# Patient Record
Sex: Female | Born: 1950
Health system: Southern US, Community
[De-identification: ages and names within clinical notes are randomized; demographics above are authoritative.]

## PROBLEM LIST (undated history)

## (undated) DIAGNOSIS — G479 Sleep disorder, unspecified: Secondary | ICD-10-CM

## (undated) DIAGNOSIS — L509 Urticaria, unspecified: Secondary | ICD-10-CM

## (undated) DIAGNOSIS — K589 Irritable bowel syndrome without diarrhea: Secondary | ICD-10-CM

## (undated) DIAGNOSIS — K219 Gastro-esophageal reflux disease without esophagitis: Secondary | ICD-10-CM

## (undated) DIAGNOSIS — M810 Age-related osteoporosis without current pathological fracture: Secondary | ICD-10-CM

## (undated) DIAGNOSIS — G43909 Migraine, unspecified, not intractable, without status migrainosus: Secondary | ICD-10-CM

## (undated) DIAGNOSIS — R0902 Hypoxemia: Secondary | ICD-10-CM

## (undated) DIAGNOSIS — E039 Hypothyroidism, unspecified: Secondary | ICD-10-CM

## (undated) DIAGNOSIS — M797 Fibromyalgia: Secondary | ICD-10-CM

## (undated) DIAGNOSIS — H269 Unspecified cataract: Secondary | ICD-10-CM

## (undated) DIAGNOSIS — E785 Hyperlipidemia, unspecified: Secondary | ICD-10-CM

## (undated) DIAGNOSIS — K439 Ventral hernia without obstruction or gangrene: Secondary | ICD-10-CM

## (undated) DIAGNOSIS — E119 Type 2 diabetes mellitus without complications: Secondary | ICD-10-CM

## (undated) DIAGNOSIS — E079 Disorder of thyroid, unspecified: Secondary | ICD-10-CM

## (undated) DIAGNOSIS — K769 Liver disease, unspecified: Secondary | ICD-10-CM

## (undated) DIAGNOSIS — E669 Obesity, unspecified: Secondary | ICD-10-CM

## (undated) DIAGNOSIS — I10 Essential (primary) hypertension: Secondary | ICD-10-CM

## (undated) DIAGNOSIS — A159 Respiratory tuberculosis unspecified: Secondary | ICD-10-CM

## (undated) DIAGNOSIS — M1990 Unspecified osteoarthritis, unspecified site: Secondary | ICD-10-CM

## (undated) DIAGNOSIS — T7840XA Allergy, unspecified, initial encounter: Secondary | ICD-10-CM

## (undated) DIAGNOSIS — J189 Pneumonia, unspecified organism: Secondary | ICD-10-CM

## (undated) DIAGNOSIS — R42 Dizziness and giddiness: Secondary | ICD-10-CM

## (undated) DIAGNOSIS — G4730 Sleep apnea, unspecified: Secondary | ICD-10-CM

## (undated) DIAGNOSIS — F419 Anxiety disorder, unspecified: Secondary | ICD-10-CM

## (undated) DIAGNOSIS — F32A Depression, unspecified: Secondary | ICD-10-CM

## (undated) DIAGNOSIS — N182 Chronic kidney disease, stage 2 (mild): Secondary | ICD-10-CM

## (undated) DIAGNOSIS — F329 Major depressive disorder, single episode, unspecified: Secondary | ICD-10-CM

## (undated) HISTORY — DX: Ventral hernia without obstruction or gangrene: K43.9

## (undated) HISTORY — DX: Hyperlipidemia, unspecified: E78.5

## (undated) HISTORY — DX: Essential (primary) hypertension: I10

## (undated) HISTORY — DX: Migraine, unspecified, not intractable, without status migrainosus: G43.909

## (undated) HISTORY — DX: Disorder of thyroid, unspecified: E07.9

## (undated) HISTORY — DX: Unspecified osteoarthritis, unspecified site: M19.90

## (undated) HISTORY — PX: TONSILLECTOMY: SUR1361

## (undated) HISTORY — DX: Gastro-esophageal reflux disease without esophagitis: K21.9

## (undated) HISTORY — DX: Fibromyalgia: M79.7

## (undated) HISTORY — DX: Hypoxemia: R09.02

## (undated) HISTORY — PX: TOTAL KNEE ARTHROPLASTY: SHX125

## (undated) HISTORY — DX: Anxiety disorder, unspecified: F41.9

## (undated) HISTORY — DX: Sleep disorder, unspecified: G47.9

## (undated) HISTORY — PX: ABDOMINAL HYSTERECTOMY: SHX81

## (undated) HISTORY — PX: JOINT REPLACEMENT: SHX530

## (undated) HISTORY — PX: CHOLECYSTECTOMY: SHX55

## (undated) HISTORY — DX: Sleep apnea, unspecified: G47.30

## (undated) HISTORY — DX: Obesity, unspecified: E66.9

## (undated) HISTORY — DX: Age-related osteoporosis without current pathological fracture: M81.0

## (undated) HISTORY — DX: Chronic kidney disease, stage 2 (mild): N18.2

## (undated) HISTORY — PX: CARPAL TUNNEL RELEASE: SHX101

## (undated) HISTORY — PX: ADENOIDECTOMY: SUR15

## (undated) HISTORY — DX: Liver disease, unspecified: K76.9

## (undated) HISTORY — PX: APPENDECTOMY: SHX54

## (undated) HISTORY — PX: TUBAL LIGATION: SHX77

## (undated) HISTORY — DX: Type 2 diabetes mellitus without complications: E11.9

## (undated) HISTORY — DX: Respiratory tuberculosis unspecified: A15.9

## (undated) HISTORY — DX: Major depressive disorder, single episode, unspecified: F32.9

## (undated) HISTORY — DX: Unspecified cataract: H26.9

## (undated) HISTORY — DX: Dizziness and giddiness: R42

## (undated) HISTORY — DX: Urticaria, unspecified: L50.9

## (undated) HISTORY — DX: Depression, unspecified: F32.A

## (undated) HISTORY — DX: Allergy, unspecified, initial encounter: T78.40XA

## (undated) HISTORY — DX: Irritable bowel syndrome, unspecified: K58.9

## (undated) HISTORY — PX: SINOSCOPY: SHX187

## (undated) HISTORY — PX: KNEE ARTHROSCOPY: SUR90

## (undated) HISTORY — PX: COLONOSCOPY: SHX174

## (undated) HISTORY — PX: CATARACT EXTRACTION, BILATERAL: SHX1313

---

## 2000-03-06 ENCOUNTER — Encounter: Payer: Self-pay | Admitting: *Deleted

## 2000-03-06 ENCOUNTER — Encounter: Admission: RE | Admit: 2000-03-06 | Discharge: 2000-03-06 | Payer: Self-pay | Admitting: *Deleted

## 2000-08-30 ENCOUNTER — Other Ambulatory Visit: Admission: RE | Admit: 2000-08-30 | Discharge: 2000-08-30 | Payer: Self-pay | Admitting: *Deleted

## 2000-09-06 ENCOUNTER — Encounter: Admission: RE | Admit: 2000-09-06 | Discharge: 2000-09-06 | Payer: Self-pay | Admitting: *Deleted

## 2000-12-19 ENCOUNTER — Encounter: Admission: RE | Admit: 2000-12-19 | Discharge: 2000-12-19 | Payer: Self-pay | Admitting: Otolaryngology

## 2000-12-19 ENCOUNTER — Encounter: Payer: Self-pay | Admitting: Otolaryngology

## 2001-04-03 ENCOUNTER — Encounter: Payer: Self-pay | Admitting: *Deleted

## 2001-04-03 ENCOUNTER — Encounter: Admission: RE | Admit: 2001-04-03 | Discharge: 2001-04-03 | Payer: Self-pay | Admitting: *Deleted

## 2001-09-19 ENCOUNTER — Other Ambulatory Visit: Admission: RE | Admit: 2001-09-19 | Discharge: 2001-09-19 | Payer: Self-pay | Admitting: *Deleted

## 2002-04-16 ENCOUNTER — Encounter: Admission: RE | Admit: 2002-04-16 | Discharge: 2002-04-16 | Payer: Self-pay | Admitting: Internal Medicine

## 2002-04-16 ENCOUNTER — Encounter: Payer: Self-pay | Admitting: Internal Medicine

## 2003-05-05 ENCOUNTER — Encounter: Payer: Self-pay | Admitting: Internal Medicine

## 2003-05-05 ENCOUNTER — Encounter: Admission: RE | Admit: 2003-05-05 | Discharge: 2003-05-05 | Payer: Self-pay | Admitting: Internal Medicine

## 2003-10-19 ENCOUNTER — Other Ambulatory Visit: Admission: RE | Admit: 2003-10-19 | Discharge: 2003-10-19 | Payer: Self-pay | Admitting: Internal Medicine

## 2003-10-21 ENCOUNTER — Ambulatory Visit (HOSPITAL_COMMUNITY): Admission: RE | Admit: 2003-10-21 | Discharge: 2003-10-21 | Payer: Self-pay | Admitting: Internal Medicine

## 2004-10-20 ENCOUNTER — Ambulatory Visit (HOSPITAL_COMMUNITY): Admission: RE | Admit: 2004-10-20 | Discharge: 2004-10-20 | Payer: Self-pay | Admitting: Internal Medicine

## 2005-10-02 ENCOUNTER — Encounter: Admission: RE | Admit: 2005-10-02 | Discharge: 2005-10-29 | Payer: Self-pay | Admitting: Internal Medicine

## 2008-03-17 ENCOUNTER — Encounter: Admission: RE | Admit: 2008-03-17 | Discharge: 2008-03-17 | Payer: Self-pay | Admitting: General Surgery

## 2008-05-27 ENCOUNTER — Other Ambulatory Visit: Admission: RE | Admit: 2008-05-27 | Discharge: 2008-05-27 | Payer: Self-pay | Admitting: Internal Medicine

## 2008-06-29 ENCOUNTER — Emergency Department (HOSPITAL_COMMUNITY): Admission: EM | Admit: 2008-06-29 | Discharge: 2008-06-29 | Payer: Self-pay | Admitting: Emergency Medicine

## 2008-10-21 ENCOUNTER — Ambulatory Visit (HOSPITAL_COMMUNITY): Admission: RE | Admit: 2008-10-21 | Discharge: 2008-10-21 | Payer: Self-pay | Admitting: Internal Medicine

## 2009-11-19 ENCOUNTER — Ambulatory Visit (HOSPITAL_COMMUNITY): Admission: RE | Admit: 2009-11-19 | Discharge: 2009-11-19 | Payer: Self-pay | Admitting: Internal Medicine

## 2010-02-16 ENCOUNTER — Encounter: Admission: RE | Admit: 2010-02-16 | Discharge: 2010-02-16 | Payer: Self-pay | Admitting: Orthopedic Surgery

## 2010-06-08 ENCOUNTER — Encounter: Payer: Self-pay | Admitting: Cardiovascular Disease

## 2010-08-24 ENCOUNTER — Ambulatory Visit (HOSPITAL_COMMUNITY): Admission: RE | Admit: 2010-08-24 | Discharge: 2010-08-24 | Payer: Self-pay | Admitting: Internal Medicine

## 2010-12-06 ENCOUNTER — Ambulatory Visit: Payer: Self-pay | Admitting: Cardiovascular Disease

## 2010-12-11 ENCOUNTER — Emergency Department (HOSPITAL_COMMUNITY)
Admission: EM | Admit: 2010-12-11 | Discharge: 2010-12-11 | Disposition: A | Discharge status: Home / Self Care | Payer: BC Managed Care – PPO | Attending: Emergency Medicine | Admitting: Emergency Medicine

## 2010-12-11 DIAGNOSIS — E7800 Pure hypercholesterolemia, unspecified: Secondary | ICD-10-CM | POA: Insufficient documentation

## 2010-12-11 DIAGNOSIS — Z79899 Other long term (current) drug therapy: Secondary | ICD-10-CM | POA: Insufficient documentation

## 2010-12-11 DIAGNOSIS — I10 Essential (primary) hypertension: Secondary | ICD-10-CM | POA: Insufficient documentation

## 2010-12-11 DIAGNOSIS — J45909 Unspecified asthma, uncomplicated: Secondary | ICD-10-CM | POA: Insufficient documentation

## 2010-12-11 DIAGNOSIS — K219 Gastro-esophageal reflux disease without esophagitis: Secondary | ICD-10-CM | POA: Insufficient documentation

## 2010-12-11 DIAGNOSIS — F3289 Other specified depressive episodes: Secondary | ICD-10-CM | POA: Insufficient documentation

## 2010-12-11 DIAGNOSIS — E039 Hypothyroidism, unspecified: Secondary | ICD-10-CM | POA: Insufficient documentation

## 2010-12-11 DIAGNOSIS — F329 Major depressive disorder, single episode, unspecified: Secondary | ICD-10-CM | POA: Insufficient documentation

## 2010-12-11 DIAGNOSIS — R7309 Other abnormal glucose: Secondary | ICD-10-CM | POA: Insufficient documentation

## 2010-12-11 LAB — POCT I-STAT, CHEM 8
BUN: 11 mg/dL (ref 6–23)
Calcium, Ion: 1.01 mmol/L — ABNORMAL LOW (ref 1.12–1.32)
HCT: 45 % (ref 36.0–46.0)
Hemoglobin: 15.3 g/dL — ABNORMAL HIGH (ref 12.0–15.0)
Sodium: 133 mEq/L — ABNORMAL LOW (ref 135–145)
TCO2: 27 mmol/L (ref 0–100)

## 2010-12-11 LAB — URINALYSIS, ROUTINE W REFLEX MICROSCOPIC
Ketones, ur: NEGATIVE mg/dL
pH: 6.5 (ref 5.0–8.0)

## 2010-12-11 LAB — GLUCOSE, CAPILLARY: Glucose-Capillary: 234 mg/dL — ABNORMAL HIGH (ref 70–99)

## 2010-12-11 LAB — URINE MICROSCOPIC-ADD ON

## 2010-12-15 ENCOUNTER — Encounter: Payer: Self-pay | Admitting: Cardiovascular Disease

## 2010-12-15 ENCOUNTER — Ambulatory Visit (INDEPENDENT_AMBULATORY_CARE_PROVIDER_SITE_OTHER): Payer: BC Managed Care – PPO | Admitting: Cardiovascular Disease

## 2010-12-15 DIAGNOSIS — I10 Essential (primary) hypertension: Secondary | ICD-10-CM

## 2010-12-15 DIAGNOSIS — E1129 Type 2 diabetes mellitus with other diabetic kidney complication: Secondary | ICD-10-CM | POA: Insufficient documentation

## 2010-12-15 DIAGNOSIS — Z01810 Encounter for preprocedural cardiovascular examination: Secondary | ICD-10-CM

## 2010-12-15 DIAGNOSIS — G4733 Obstructive sleep apnea (adult) (pediatric): Secondary | ICD-10-CM | POA: Insufficient documentation

## 2010-12-15 DIAGNOSIS — K219 Gastro-esophageal reflux disease without esophagitis: Secondary | ICD-10-CM | POA: Insufficient documentation

## 2010-12-15 DIAGNOSIS — E039 Hypothyroidism, unspecified: Secondary | ICD-10-CM | POA: Insufficient documentation

## 2010-12-15 DIAGNOSIS — F3340 Major depressive disorder, recurrent, in remission, unspecified: Secondary | ICD-10-CM | POA: Insufficient documentation

## 2010-12-15 DIAGNOSIS — F32A Depression, unspecified: Secondary | ICD-10-CM | POA: Insufficient documentation

## 2010-12-15 DIAGNOSIS — E119 Type 2 diabetes mellitus without complications: Secondary | ICD-10-CM | POA: Insufficient documentation

## 2010-12-21 NOTE — Assessment & Plan Note (Signed)
Summary: consult.for surgical clearance - total knee replacement by dr...   Visit Type:  Initial Consult Primary Provider:  Dr. Marisue Brooklyn  CC:  No complaints.  History of Present Illness: 59 yo WF with history of DM, HTN, hyperlipidemia, obesity who is referred today for pre-operative evaluation prior to upcoming left knee replacement in April with Dr. Thurston Hole.  She denies any chest pain, SOB, palpitations, near syncope or syncope. She feels well except for her knee pain. She underwent a stress test several years ago with Dr. Eden Emms in w/u of palpitations and this was normal. She is not limited in any of her activities of daily living because of chest pain, fatigue or SOB.   Current Medications (verified): 1)  Metformin Hcl 500 Mg Tabs (Metformin Hcl) .... Take 1 Tablet By Mouth Two Times A Day 2)  Diovan Hct 320-25 Mg Tabs (Valsartan-Hydrochlorothiazide) .... Take 1 Tablet By Mouth Once A Day 3)  Cytomel 25 Mcg Tabs (Liothyronine Sodium) .Marland Kitchen.. 1 Tab By Mouth Two Times A Day 4)  Crestor 20 Mg Tabs (Rosuvastatin Calcium) .... Take 1 Tablet By Mouth Once A Day 5)  Cymbalta 60 Mg Cpep (Duloxetine Hcl) .... Take 1 Tablet By Mouth Once A Day 6)  Nexium 40 Mg Cpdr (Esomeprazole Magnesium) .... Take 1 Tablet By Mouth Once A Day 7)  Doxepin Hcl 10 Mg Caps (Doxepin Hcl) .... As Needed For Sleep 8)  Xyzal 5 Mg Tabs (Levocetirizine Dihydrochloride) .... As Needed For Allergies 9)  Novolog Flexpen 100 Unit/ml Soln (Insulin Aspart) .... As Directed 10)  Synthroid 175 Mcg Tabs (Levothyroxine Sodium) .... 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  Alcohol Use - yes, occasional Drug Use - no  Review of Systems       The patient complains of joint pain.  The patient denies fatigue, malaise, fever, weight gain/loss, vision loss, decreased hearing, hoarseness, chest pain, palpitations, shortness of breath, prolonged cough, wheezing, sleep apnea, coughing up blood, abdominal pain, blood in stool, nausea, vomiting, diarrhea, heartburn, incontinence, blood in urine, muscle weakness, leg swelling, rash, skin lesions, headache, fainting, dizziness, depression, anxiety, enlarged lymph nodes, easy bruising or bleeding, and environmental allergies.    Vital Signs:  Patient profile:   60 year old female Height:      63 inches Weight:      235.50 pounds BMI:     41.87 Pulse rate:   98 / minute Resp:     18 per minute BP sitting:   132 / 92  (left arm) Cuff size:   large  Vitals Entered By: Celestia Khat, CMA (December 15, 2010 4:00 PM)  Physical Exam  General:  General: Well developed, well nourished, NAD HEENT: OP clear, mucus membranes moist SKIN: warm, damp Neuro: No focal deficits Musculoskeletal: Muscle strength 5/5 all ext Psychiatric: Mood and affect normal Neck: No JVD, no carotid bruits, no thyromegaly, no lymphadenopathy. Lungs:Clear bilaterally, no wheezes, rhonci, crackles CV: RRR no murmurs, gallops rubs Abdomen: soft, NT, ND, BS present Extremities: No  edema, pulses 2+.    EKG  Procedure date:  12/15/2010  Findings:      sinus rhythm, rate 99 bpm. Non-specific T wave abnormality lateral leads.   Impression & Recommendations:  Problem # 1:  PRE-OPERATIVE CARDIAC EXAM (ICD-V72.81) Grace Barry has well controlled HTN, hyperlipidemia and recently diagnosed DM. She has  no chest pain worrisome for angina, CHF or evidence of arrhthymias. She is functioning in her activities of daily liviing without problems. Based on current ACC/AHA guidelines, no ischemic testing is indicated prior to her planned knee surgery. I would proceed to the surgery without further cardiac workup.   Patient Instructions: 1)  Your physician recommends that you schedule a follow-up appointment in: as needed.

## 2011-01-05 NOTE — Letter (Signed)
Summary: [facility]  [facility]   Imported By: Marylou Mccoy 12/29/2010 15:10:18  _____________________________________________________________________  External Attachment:    Type:   Image     Comment:   External Document

## 2011-01-31 ENCOUNTER — Other Ambulatory Visit (HOSPITAL_COMMUNITY): Payer: Self-pay | Admitting: Orthopedic Surgery

## 2011-01-31 ENCOUNTER — Telehealth: Payer: Self-pay | Admitting: Cardiovascular Disease

## 2011-01-31 ENCOUNTER — Encounter (HOSPITAL_COMMUNITY)
Admission: RE | Admit: 2011-01-31 | Discharge: 2011-01-31 | Disposition: A | Discharge status: Home / Self Care | Payer: BC Managed Care – PPO | Source: Ambulatory Visit | Attending: Orthopedic Surgery | Admitting: Orthopedic Surgery

## 2011-01-31 ENCOUNTER — Ambulatory Visit (HOSPITAL_COMMUNITY)
Admission: RE | Admit: 2011-01-31 | Discharge: 2011-01-31 | Disposition: A | Discharge status: Home / Self Care | Payer: BC Managed Care – PPO | Source: Ambulatory Visit | Attending: Orthopedic Surgery | Admitting: Orthopedic Surgery

## 2011-01-31 DIAGNOSIS — Z01812 Encounter for preprocedural laboratory examination: Secondary | ICD-10-CM | POA: Insufficient documentation

## 2011-01-31 DIAGNOSIS — M1712 Unilateral primary osteoarthritis, left knee: Secondary | ICD-10-CM

## 2011-01-31 DIAGNOSIS — I10 Essential (primary) hypertension: Secondary | ICD-10-CM | POA: Insufficient documentation

## 2011-01-31 DIAGNOSIS — M1710 Unilateral primary osteoarthritis, unspecified knee: Secondary | ICD-10-CM | POA: Insufficient documentation

## 2011-01-31 DIAGNOSIS — IMO0002 Reserved for concepts with insufficient information to code with codable children: Secondary | ICD-10-CM | POA: Insufficient documentation

## 2011-01-31 DIAGNOSIS — G4730 Sleep apnea, unspecified: Secondary | ICD-10-CM | POA: Insufficient documentation

## 2011-01-31 DIAGNOSIS — Z01818 Encounter for other preprocedural examination: Secondary | ICD-10-CM | POA: Insufficient documentation

## 2011-01-31 DIAGNOSIS — Z01810 Encounter for preprocedural cardiovascular examination: Secondary | ICD-10-CM | POA: Insufficient documentation

## 2011-01-31 LAB — PROTIME-INR: Prothrombin Time: 13.4 seconds (ref 11.6–15.2)

## 2011-01-31 LAB — COMPREHENSIVE METABOLIC PANEL
ALT: 42 U/L — ABNORMAL HIGH (ref 0–35)
AST: 34 U/L (ref 0–37)
Albumin: 4 g/dL (ref 3.5–5.2)
Calcium: 9.7 mg/dL (ref 8.4–10.5)
Creatinine, Ser: 0.79 mg/dL (ref 0.4–1.2)
GFR calc non Af Amer: >60 mL/min (ref >60)
Sodium: 137 mEq/L (ref 135–145)
Total Protein: 7.1 g/dL (ref 6.0–8.3)

## 2011-01-31 LAB — APTT: aPTT: 45 seconds — ABNORMAL HIGH (ref 24–37)

## 2011-01-31 LAB — CBC
HCT: 40.1 % (ref 36.0–46.0)
MCH: 28.5 pg (ref 26.0–34.0)
MCV: 83.9 fL (ref 78.0–100.0)
Platelets: 261 10*3/uL (ref 150–400)
RDW: 12.7 % (ref 11.5–15.5)
WBC: 9.9 10*3/uL (ref 4.0–10.5)

## 2011-01-31 LAB — SURGICAL PCR SCREEN
MRSA, PCR: NEGATIVE
Staphylococcus aureus: NEGATIVE

## 2011-01-31 LAB — LIPID PANEL
HDL: 32 mg/dL — ABNORMAL LOW (ref >39)
Total CHOL/HDL Ratio: 4 RATIO
VLDL: 32 mg/dL (ref 0–40)

## 2011-01-31 LAB — URINALYSIS, ROUTINE W REFLEX MICROSCOPIC
Bilirubin Urine: NEGATIVE
Ketones, ur: NEGATIVE mg/dL
Nitrite: NEGATIVE
Urobilinogen, UA: 0.2 mg/dL (ref 0.0–1.0)

## 2011-01-31 LAB — URINE MICROSCOPIC-ADD ON

## 2011-01-31 LAB — DIFFERENTIAL
Basophils Absolute: 0 10*3/uL (ref 0.0–0.1)
Basophils Relative: 0 % (ref 0–1)
Eosinophils Absolute: 0.2 10*3/uL (ref 0.0–0.7)
Eosinophils Relative: 2 % (ref 0–5)

## 2011-01-31 LAB — HEMOGLOBIN A1C: Hgb A1c MFr Bld: 7.7 % — ABNORMAL HIGH (ref <5.7)

## 2011-01-31 NOTE — Telephone Encounter (Signed)
12 lead faxed to Kaye/[facility] @ [phone] 01/31/11/IKM

## 2011-02-01 LAB — URINE CULTURE
Colony Count: NO GROWTH
Culture  Setup Time: 201204031608
Culture: NO GROWTH

## 2011-02-06 ENCOUNTER — Inpatient Hospital Stay (HOSPITAL_COMMUNITY)
Admission: RE | Admit: 2011-02-06 | Discharge: 2011-02-08 | DRG: 209 | Disposition: A | Discharge status: Home / Self Care | Payer: BC Managed Care – PPO | Source: Ambulatory Visit | Attending: Orthopedic Surgery | Admitting: Orthopedic Surgery

## 2011-02-06 DIAGNOSIS — M1710 Unilateral primary osteoarthritis, unspecified knee: Principal | ICD-10-CM | POA: Diagnosis present

## 2011-02-06 LAB — GLUCOSE, CAPILLARY
Glucose-Capillary: 150 mg/dL — ABNORMAL HIGH (ref 70–99)
Glucose-Capillary: 137 mg/dL — ABNORMAL HIGH (ref 70–99)

## 2011-02-06 LAB — ABO/RH: ABO/RH(D): O POS

## 2011-02-07 LAB — CBC
HCT: 32.5 % — ABNORMAL LOW (ref 36.0–46.0)
MCHC: 33.2 g/dL (ref 30.0–36.0)
RDW: 13.5 % (ref 11.5–15.5)

## 2011-02-07 LAB — BASIC METABOLIC PANEL
BUN: 8 mg/dL (ref 6–23)
CO2: 29 mEq/L (ref 19–32)
Chloride: 100 mEq/L (ref 96–112)
Creatinine, Ser: 0.9 mg/dL (ref 0.4–1.2)

## 2011-02-07 LAB — GLUCOSE, CAPILLARY
Glucose-Capillary: 133 mg/dL — ABNORMAL HIGH (ref 70–99)
Glucose-Capillary: 139 mg/dL — ABNORMAL HIGH (ref 70–99)
Glucose-Capillary: 123 mg/dL — ABNORMAL HIGH (ref 70–99)

## 2011-02-07 LAB — PROTIME-INR: INR: 1.06 (ref 0.00–1.49)

## 2011-02-08 LAB — BASIC METABOLIC PANEL
Calcium: 8.4 mg/dL (ref 8.4–10.5)
GFR calc Af Amer: >60 mL/min (ref >60)
GFR calc non Af Amer: >60 mL/min (ref >60)
Sodium: 139 mEq/L (ref 135–145)

## 2011-02-08 LAB — CBC
Platelets: 179 10*3/uL (ref 150–400)
RBC: 3.32 MIL/uL — ABNORMAL LOW (ref 3.87–5.11)
WBC: 9.8 10*3/uL (ref 4.0–10.5)

## 2011-02-08 LAB — PROTIME-INR
INR: 1.14 (ref 0.00–1.49)
Prothrombin Time: 14.8 seconds (ref 11.6–15.2)

## 2011-02-08 LAB — GLUCOSE, CAPILLARY: Glucose-Capillary: 117 mg/dL — ABNORMAL HIGH (ref 70–99)

## 2011-02-19 NOTE — Op Note (Signed)
NAMEJAKARI, Grace Barry                 ACCOUNT NO.:  [id number]  MEDICAL RECORD NO.:  [id number]           PATIENT TYPE:  I  LOCATION:  5012                         FACILITY:  [facility]  PHYSICIAN:  Tranice Laduke A. Thurston Hole, M.D. DATE OF BIRTH:  [birth year]  DATE OF PROCEDURE:  02/06/2011 DATE OF DISCHARGE:                              OPERATIVE REPORT   PREOPERATIVE DIAGNOSIS:  Left knee degenerative joint disease.  POSTOPERATIVE DIAGNOSIS:  Left knee degenerative joint disease.  PROCEDURES: 1. Left total knee replacement using DePuy cemented total knee system     with #2 cemented femur, #2 tibial baseplate with 12.5 mm     polyethylene RP tibial spacer and 32 mm polyethylene cemented     patella. 2. Zinacef impregnated cement.  SURGEON:  Elana Alm. Thurston Hole, MD  ASSISTANT:  Julien Girt, PA  ANESTHESIA:  General.  OPERATIVE TIME:  1 hour and 30 minutes.  COMPLICATIONS:  None.  DESCRIPTION OF PROCEDURE:  Grace Barry was brought in the operating room on February 06, 2011, after a femoral nerve block was placed in the holding room by Anesthesia.  She was placed on operative table in supine position.  After being placed under general anesthesia, she had a Foley catheter placed under sterile conditions.  She received Ancef 2 grams IV preoperatively for prophylaxis.  Her left knee was examined under anesthesia.  Range of motion 0-125 degrees, moderate varus deformity, knee stable, ligamentous exam with normal patellar tracking.  Left leg was prepped using sterile DuraPrep and draped using sterile technique. Time-out procedure was called and correct left leg identified.  Left leg was exsanguinated and a thigh tourniquet elevated at 375 mm.  Initially, through a 15-cm longitudinal incision based over the patella, initial exposure was made.  The underlying subcutaneous tissues were incised along with skin incision.  A median arthrotomy was performed revealing an excessive amount of  normal-appearing joint fluid.  The articular surfaces were inspected.  She had grade 4 changes medially, laterally, and in the patellofemoral joint.  Osteophytes were removed from the femoral condyles and tibial plateau.  The medial and lateral meniscal remnants were removed as well as the anterior cruciate ligament. Intramedullary drill was then drilled up the femoral canal for placement of distal femoral cutting jig which was placed in appropriate manner of rotation and a distal 10-mm cut was made.  The distal femur was then sized, a #2 was found to be appropriate size.  A #2 cutting jig was placed in the appropriate manner of external rotation and then these cuts were made.  Proximal tibia was then exposed.  Tibial spines were removed with an oscillating saw.  Intramedullary drill was drilled down the tibial canal for placement of the proximal tibial cutting jig which was placed in the appropriate manner of rotation and a proximal 4-mm cut was made based off the medial or lower side.  Spacer blocks were then placed in flexion and extension; 12.5-mm blocks gave excellent balancing and excellent stability and excellent correction of her flexion and varus deformities.  A #2 tibial baseplate trial was then placed on the cut tibial surface  with an excellent fit and then the keel cut was made. The PCL box cutter was then placed on the distal femur and then these cuts were made.  At this point, #2 femoral trial was placed with a #2 tibial baseplate and a 12.5-mm polyethylene RP tibial spacer, knee was reduced, taken through a range of motion from 0-125 degrees with excellent stability and excellent correction of her flexion and varus deformities and normal patellar tracking.  A resurfacing 8.5-mm cut was made on the patella and three locking holes placed for a 32-mm patellar trial.  At this point, patellofemoral tracking was again evaluated and found to be normal.  At this point, I felt that  all the trial components were of excellent size, fit, and stability.  They were then removed, the knee was then jet lavage irrigated with 3 liters of saline.  The proximal tibia was then exposed.  A #2 tibial baseplate with Zinacef- impregnated cement backing was hammered into position with an excellent fit with excess cement being removed from around the edges.  A #2 femoral component with cement backing was hammered into position, also with an excellent fit with excess cement being removed from around the edges.  The 12.5-mm polyethylene RP tibial spacer was placed on tibial baseplate.  The knee reduced, taken through a range of motion from 0-125 degrees with excellent stability and excellent correction of her flexion and varus deformities.  The 32-mm polyethylene cement backed patella was then placed in its position and held there with a clamp.  After the cement hardened, again patellofemoral tracking was evaluated and found to be normal.  At this point, it was felt that all the components were of excellent size, fit and stability.  The knee was further irrigated with saline.  Tourniquet was released.  Hemostasis obtained with cautery.  The arthrotomy was then closed with #1 Ethilon suture over two medium Hemovac drains.  Subcutaneous tissues closed with 0 and 2-0 Vicryl, subcuticular layer closed with 4-0 Monocryl.  Sterile dressings, long-leg splint applied and then the patient awakened, extubated and taken to recovery in stable condition.  Needle and sponge count was correct x2 at the end of the case.  Neurovascular status normal.  Pulses 2+ and symmetric.     Crue Otero A. Thurston Hole, M.D.     RAW/MEDQ  D:  02/06/2011  T:  02/06/2011  Job:  [id number]  Electronically Signed by Salvatore Marvel M.D. on 02/19/2011 01:06:23 PM

## 2011-04-04 ENCOUNTER — Telehealth: Payer: Self-pay | Admitting: Cardiovascular Disease

## 2011-04-04 ENCOUNTER — Encounter (HOSPITAL_COMMUNITY)
Admit: 2011-04-04 | Discharge: 2011-04-04 | Disposition: A | Discharge status: Home / Self Care | Payer: BC Managed Care – PPO | Attending: Orthopedic Surgery | Admitting: Orthopedic Surgery

## 2011-04-04 LAB — COMPREHENSIVE METABOLIC PANEL
Alkaline Phosphatase: 171 U/L — ABNORMAL HIGH (ref 39–117)
BUN: 11 mg/dL (ref 6–23)
CO2: 29 mEq/L (ref 19–32)
Chloride: 97 mEq/L (ref 96–112)
Creatinine, Ser: 0.75 mg/dL (ref 0.4–1.2)
GFR calc non Af Amer: >60 mL/min (ref >60)
Glucose, Bld: 138 mg/dL — ABNORMAL HIGH (ref 70–99)
Potassium: 3.9 mEq/L (ref 3.5–5.1)
Total Bilirubin: 0.4 mg/dL (ref 0.3–1.2)

## 2011-04-04 LAB — URINALYSIS, ROUTINE W REFLEX MICROSCOPIC
Hgb urine dipstick: NEGATIVE
Ketones, ur: NEGATIVE mg/dL
Protein, ur: NEGATIVE mg/dL
Urobilinogen, UA: 1 mg/dL (ref 0.0–1.0)

## 2011-04-04 LAB — DIFFERENTIAL
Basophils Absolute: 0 10*3/uL (ref 0.0–0.1)
Basophils Relative: 0 % (ref 0–1)
Eosinophils Relative: 2 % (ref 0–5)
Monocytes Absolute: 0.6 10*3/uL (ref 0.1–1.0)
Monocytes Relative: 5 % (ref 3–12)

## 2011-04-04 LAB — CBC
HCT: 39.1 % (ref 36.0–46.0)
MCH: 27.2 pg (ref 26.0–34.0)
MCHC: 32.7 g/dL (ref 30.0–36.0)
RDW: 13.5 % (ref 11.5–15.5)

## 2011-04-04 LAB — SURGICAL PCR SCREEN
MRSA, PCR: NEGATIVE
Staphylococcus aureus: NEGATIVE

## 2011-04-04 LAB — TYPE AND SCREEN: ABO/RH(D): O POS

## 2011-04-04 LAB — PROTIME-INR: Prothrombin Time: 12.5 seconds (ref 11.6–15.2)

## 2011-04-04 NOTE — Telephone Encounter (Signed)
LOV,12 faxed to Robbie/[facility] @ [phone]  04/04/11/km

## 2011-04-05 LAB — URINE CULTURE
Colony Count: NO GROWTH
Culture: NO GROWTH

## 2011-04-10 ENCOUNTER — Inpatient Hospital Stay (HOSPITAL_COMMUNITY)
Admission: RE | Admit: 2011-04-10 | Discharge: 2011-04-12 | DRG: 209 | Disposition: A | Discharge status: Home / Self Care | Payer: BC Managed Care – PPO | Source: Ambulatory Visit | Attending: Orthopedic Surgery | Admitting: Orthopedic Surgery

## 2011-04-10 DIAGNOSIS — J45909 Unspecified asthma, uncomplicated: Secondary | ICD-10-CM | POA: Diagnosis present

## 2011-04-10 DIAGNOSIS — I10 Essential (primary) hypertension: Secondary | ICD-10-CM | POA: Diagnosis present

## 2011-04-10 DIAGNOSIS — D62 Acute posthemorrhagic anemia: Secondary | ICD-10-CM | POA: Diagnosis not present

## 2011-04-10 DIAGNOSIS — R000 Tachycardia, unspecified: Secondary | ICD-10-CM | POA: Diagnosis not present

## 2011-04-10 DIAGNOSIS — E039 Hypothyroidism, unspecified: Secondary | ICD-10-CM | POA: Diagnosis present

## 2011-04-10 DIAGNOSIS — R079 Chest pain, unspecified: Secondary | ICD-10-CM | POA: Diagnosis not present

## 2011-04-10 DIAGNOSIS — M1710 Unilateral primary osteoarthritis, unspecified knee: Principal | ICD-10-CM | POA: Diagnosis present

## 2011-04-10 DIAGNOSIS — K219 Gastro-esophageal reflux disease without esophagitis: Secondary | ICD-10-CM | POA: Diagnosis present

## 2011-04-10 DIAGNOSIS — E119 Type 2 diabetes mellitus without complications: Secondary | ICD-10-CM | POA: Diagnosis present

## 2011-04-10 DIAGNOSIS — F329 Major depressive disorder, single episode, unspecified: Secondary | ICD-10-CM | POA: Diagnosis present

## 2011-04-10 DIAGNOSIS — R0602 Shortness of breath: Secondary | ICD-10-CM | POA: Diagnosis not present

## 2011-04-10 DIAGNOSIS — K7689 Other specified diseases of liver: Secondary | ICD-10-CM | POA: Diagnosis present

## 2011-04-10 DIAGNOSIS — G4733 Obstructive sleep apnea (adult) (pediatric): Secondary | ICD-10-CM | POA: Diagnosis present

## 2011-04-10 DIAGNOSIS — F3289 Other specified depressive episodes: Secondary | ICD-10-CM | POA: Diagnosis present

## 2011-04-10 DIAGNOSIS — Z01818 Encounter for other preprocedural examination: Secondary | ICD-10-CM

## 2011-04-10 LAB — GLUCOSE, CAPILLARY
Glucose-Capillary: 102 mg/dL — ABNORMAL HIGH (ref 70–99)
Glucose-Capillary: 88 mg/dL (ref 70–99)
Glucose-Capillary: 128 mg/dL — ABNORMAL HIGH (ref 70–99)

## 2011-04-11 LAB — GLUCOSE, CAPILLARY: Glucose-Capillary: 147 mg/dL — ABNORMAL HIGH (ref 70–99)

## 2011-04-11 LAB — CARDIAC PANEL(CRET KIN+CKTOT+MB+TROPI)
Relative Index: INVALID (ref 0.0–2.5)
Relative Index: 1.3 (ref 0.0–2.5)
Total CK: 66 U/L (ref 7–177)
Troponin I: <0.3 ng/mL (ref <0.30)
Troponin I: <0.3 ng/mL (ref <0.30)

## 2011-04-11 LAB — BASIC METABOLIC PANEL
CO2: 30 mEq/L (ref 19–32)
Calcium: 8.5 mg/dL (ref 8.4–10.5)
Chloride: 99 mEq/L (ref 96–112)
Creatinine, Ser: 0.71 mg/dL (ref 0.4–1.2)
Glucose, Bld: 147 mg/dL — ABNORMAL HIGH (ref 70–99)
Sodium: 136 mEq/L (ref 135–145)

## 2011-04-11 LAB — HEMOGLOBIN A1C: Hgb A1c MFr Bld: 6 % — ABNORMAL HIGH (ref <5.7)

## 2011-04-11 LAB — CBC
Hemoglobin: 10.4 g/dL — ABNORMAL LOW (ref 12.0–15.0)
MCH: 27.4 pg (ref 26.0–34.0)
MCV: 82.9 fL (ref 78.0–100.0)
Platelets: 193 10*3/uL (ref 150–400)
RBC: 3.8 MIL/uL — ABNORMAL LOW (ref 3.87–5.11)
WBC: 9.9 10*3/uL (ref 4.0–10.5)

## 2011-04-12 ENCOUNTER — Inpatient Hospital Stay (HOSPITAL_COMMUNITY): Payer: BC Managed Care – PPO

## 2011-04-12 LAB — CARDIAC PANEL(CRET KIN+CKTOT+MB+TROPI)
CK, MB: 1.3 ng/mL (ref 0.3–4.0)
Relative Index: INVALID (ref 0.0–2.5)
Total CK: 63 U/L (ref 7–177)
Troponin I: <0.3 ng/mL (ref <0.30)

## 2011-04-12 LAB — CBC
Platelets: 207 10*3/uL (ref 150–400)
RBC: 3.77 MIL/uL — ABNORMAL LOW (ref 3.87–5.11)
RDW: 13.7 % (ref 11.5–15.5)
WBC: 11.5 10*3/uL — ABNORMAL HIGH (ref 4.0–10.5)

## 2011-04-12 MED ORDER — IOHEXOL 300 MG/ML  SOLN
100.0000 mL | Freq: Once | INTRAMUSCULAR | Status: AC | PRN
  Administered 2011-04-12: 100 mL via INTRAVENOUS

## 2011-04-13 LAB — GLUCOSE, CAPILLARY

## 2011-04-24 NOTE — Op Note (Signed)
NAMEEVIA, GOLDSMITH NO.:  [id number]  MEDICAL RECORD NO.:  [id number]  LOCATION:  5030                         FACILITY:  [facility]  PHYSICIAN:  Molly Maduro A. Thurston Hole, M.D. DATE OF BIRTH:  [birth year]  DATE OF PROCEDURE:  04/10/2011 DATE OF DISCHARGE:                              OPERATIVE REPORT   PREOPERATIVE DIAGNOSIS:  Right knee degenerative joint disease.  POSTOPERATIVE DIAGNOSIS:  Right knee degenerative joint disease.  PROCEDURES: 1. Right total knee replacement using DePuy cemented total knee system     with #2 cemented femur, #2 cemented tibia with 10 mm polyethylene     RP tibial spacer and 32 mm polyethylene cemented patella. 2. Zinacef impregnated cement.  SURGEON:  Elana Alm. Thurston Hole, MD  ASSISTANT:  Julien Girt, PA-C  ANESTHESIA:  General.  OPERATIVE TIME:  1 hour and 30 minutes.  COMPLICATIONS:  None.  DESCRIPTION OF PROCEDURE:  Mr. Alfonzo was brought to the operating room on April 10, 2011, after a femoral nerve block was placed in holding room by Anesthesia.  She was placed on the operative table in supine position.  She received Ancef 2 g IV preoperatively for prophylaxis. After being placed under general anesthesia, her right knee was examined.  She had range of motion from -5 to 125 degrees, moderate varus deformity, knee was stable on ligamentous exam with normal patellar tracking.  She had a Foley catheter placed under sterile conditions.  The right leg was prepped using sterile DuraPrep and draped using sterile technique.  Time-out procedure was called and the correct right knee identified.  Right leg was exsanguinated and a thigh tourniquet elevated 375 mm.  Initially through a 15-cm longitudinal incision based over the patella, initial exposure was made.  The underlying subcutaneous tissues were incised along with skin incision. A median arthrotomy was performed revealing an excessive amount of normal-appearing joint fluid.   The articular surfaces were inspected. She had grade 4 changes medially, grade 3 changes laterally, and grade 3 and 4 changes in the patellofemoral joint.  Osteophytes removed from the femoral condyles and tibial plateau.  The medial and lateral meniscal remnants were removed as well as the anterior cruciate ligament. Intramedullary drill was then drilled up the femoral canal for placement of distal femoral cutting jig which was placed in appropriate manner rotation and a distal 10-mm cut was made.  The distal femur was incised. A #2 was found to be the appropriate size.  A #2 cutting jig was placed in appropriate manner of external rotation and then these cuts were made.  The proximal tibia was then exposed.  Tibial spines were removed with an oscillating saw.  Intramedullary drill was drilled down the tibial canal for placement of proximal tibial cutting jig which was placed in appropriate manner rotation and a proximal 6-mm cut was made based off the medial side.  Spacer blocks were then placed in flexion and extension. A 10-mm block gave excellent balancing, excellent stability and excellent correction of her flexion and varus deformities. A #2 tibial baseplate trial was then placed on the cut tibial surface with an excellent fit and the keel cut was made.  The PCL box  cutter was then placed on the distal femur and these cuts were made.  At this point, the #2 femoral trial was placed with a #2 tibial baseplate trial and a 10-mm polyethylene RP tibial spacer, knee was reduced, and taken through range of motion from 0 to 125 degrees with excellent stability and excellent correction of her flexion and varus deformities and normal patellar tracking.  A resurfacing 8.5-mm cut was then made on the patella and 3 locking holes placed for a 32-mm polyethylene patellar trial and again patellofemoral tracking was evaluated and found to be normal.  At this point it was felt that all trial  components were of excellent size, fit and stability.  They were then removed.  The knee was then jet lavage irrigated with 3 L of saline.  Proximal tibia was then exposed and #2 tibial baseplate with Zinacef impregnated cement backing was hammered in position with an excellent fit with excess cement being removed from around the edges; #2 femoral component with cement backing was hammered into position also with an excellent fit with excess cement being removed from around the edges.  The 10-mm polyethylene RP tibial spacer was placed on tibial baseplate.  The knee reduced, taken through range of motion from 0 to 125 degrees with excellent stability and excellent correction of her flexion and varus deformities.  The 32-mm polyethylene cement backed patella was then placed in its position and held there with a clamp.  After this, the cement hardened, again patellofemoral tracking was evaluated and found to be normal.  At this point it was felt that all the components were of excellent size, fit, and stability.  The wound was further irrigated with saline.  Tourniquet was released.  Hemostasis obtained with cautery.  The arthrotomy was then closed with a #1 Ethibond suture over 2 medium Hemovac drains.  Subcutaneous tissue was closed with 0 and 2-0 Vicryl, subcuticular layer closed with 4-0 Monocryl.  Sterile dressings and a long-leg splint applied.  The patient was then awakened, extubated, taken to the recovery in stable condition.  Needle and sponge counts were correct x2 at the end of the case.  Neurovascular status normal.  Pulses 2+ and symmetric.     Kaylei Frink A. Thurston Hole, M.D.     RAW/MEDQ  D:  04/11/2011  T:  04/12/2011  Job:  [id number]  Electronically Signed by Salvatore Marvel M.D. on 04/24/2011 01:44:13 PM

## 2011-05-25 ENCOUNTER — Other Ambulatory Visit: Payer: Self-pay | Admitting: Orthopedic Surgery

## 2011-05-25 ENCOUNTER — Ambulatory Visit
Admission: RE | Admit: 2011-05-25 | Discharge: 2011-05-25 | Disposition: A | Discharge status: Home / Self Care | Payer: BC Managed Care – PPO | Source: Ambulatory Visit | Attending: Orthopedic Surgery | Admitting: Orthopedic Surgery

## 2011-05-25 ENCOUNTER — Other Ambulatory Visit: Payer: Self-pay | Admitting: Internal Medicine

## 2011-05-25 DIAGNOSIS — R509 Fever, unspecified: Secondary | ICD-10-CM

## 2011-05-25 DIAGNOSIS — K469 Unspecified abdominal hernia without obstruction or gangrene: Secondary | ICD-10-CM

## 2011-05-25 DIAGNOSIS — R6883 Chills (without fever): Secondary | ICD-10-CM

## 2011-05-26 ENCOUNTER — Ambulatory Visit
Admission: RE | Admit: 2011-05-26 | Discharge: 2011-05-26 | Disposition: A | Discharge status: Home / Self Care | Payer: BC Managed Care – PPO | Source: Ambulatory Visit | Attending: Internal Medicine | Admitting: Internal Medicine

## 2011-05-26 DIAGNOSIS — K469 Unspecified abdominal hernia without obstruction or gangrene: Secondary | ICD-10-CM

## 2011-05-26 MED ORDER — IOHEXOL 300 MG/ML  SOLN
100.0000 mL | Freq: Once | INTRAMUSCULAR | Status: AC | PRN
  Administered 2011-05-26: 100 mL via INTRAVENOUS

## 2011-06-12 ENCOUNTER — Ambulatory Visit (INDEPENDENT_AMBULATORY_CARE_PROVIDER_SITE_OTHER): Payer: BC Managed Care – PPO | Admitting: General Surgery

## 2011-06-12 ENCOUNTER — Encounter (INDEPENDENT_AMBULATORY_CARE_PROVIDER_SITE_OTHER): Payer: Self-pay | Admitting: General Surgery

## 2011-06-12 VITALS — BP 152/92 | HR 92 | Temp 97.2°F | Ht 63.0 in | Wt 212.2 lb

## 2011-06-12 DIAGNOSIS — K439 Ventral hernia without obstruction or gangrene: Secondary | ICD-10-CM

## 2011-06-12 NOTE — Progress Notes (Signed)
Subjective:     Patient ID: Grace Barry, female   DOB: May 09, 1951, 60 y.o.   MRN: [id number]  HPI We are asked to see the patient by Dr. Marisue Brooklyn to evaluate her for a ventral hernia. The patient is a 60 year old white female who has noticed a bulge in her upper abdomen for a number of years. She had a laparoscopic cholecystectomy many years ago and the bulge seems to be centered around her epigastric port site. She's had no real nausea or vomiting associated with it. She will notice some gurgling at times. Her bowels work normally.  Review of Systems  Constitutional: Negative.   HENT: Negative.   Eyes: Negative.   Respiratory: Negative.   Cardiovascular: Negative.   Gastrointestinal: Negative.   Genitourinary: Negative.   Musculoskeletal: Negative.   Neurological: Negative.   Hematological: Negative.   Psychiatric/Behavioral: Negative.    Past Medical History  Diagnosis Date  . Sleep difficulties   . Thyroid disease     hypothyroidism  . Diabetes mellitus   . Hyperlipidemia   . Asthma   . Hypertension   . GERD (gastroesophageal reflux disease)   . Liver disease   . Umbilical hernia   . Arthritis    Past Surgical History  Procedure Date  . Total knee arthroplasty     bilateral  . Cholecystectomy   . Abdominal hysterectomy   . Cesarean section   . Tonsillectomy   . Knee arthroscopy     bilateral  . Carpal tunnel release     bilateral   Current outpatient prescriptions:Beclomethasone Dipropionate (QVAR IN), Inhale into the lungs as needed.  , Disp: , Rfl: ;  esomeprazole (NEXIUM) 40 MG capsule, Take 40 mg by mouth daily before breakfast.  , Disp: , Rfl: ;  levocetirizine (XYZAL) 5 MG tablet, Take 5 mg by mouth as needed.  , Disp: , Rfl: ;  B-D UF III MINI PEN NEEDLES 31G X 5 MM MISC, Ad lib., Disp: , Rfl: ;  CRESTOR 20 MG tablet, Daily., Disp: , Rfl:  CYMBALTA 60 MG capsule, Daily., Disp: , Rfl: ;  DIOVAN HCT 320-25 MG per tablet, Daily., Disp: , Rfl: ;  LEVEMIR  FLEXPEN 100 UNIT/ML injection, Ad lib., Disp: , Rfl: ;  liothyronine (CYTOMEL) 25 MCG tablet, 12.5 mg BID times 48H., Disp: , Rfl: ;  meloxicam (MOBIC) 15 MG tablet, Daily., Disp: , Rfl: ;  metFORMIN (GLUCOPHAGE) 500 MG tablet, BID times 48H., Disp: , Rfl: ;  ONE TOUCH ULTRA TEST test strip, Daily., Disp: , Rfl:  SILENOR 3 MG TABS, Ad lib., Disp: , Rfl: ;  SYNTHROID 175 MCG tablet, Daily., Disp: , Rfl: ;  traZODone (DESYREL) 50 MG tablet, Ad lib., Disp: , Rfl:   Allergies  Allergen Reactions  . Floxin (Ofloxacin) Hives        Objective:   Physical Exam  Constitutional: She is oriented to person, place, and time. She appears well-developed and well-nourished.  HENT:  Head: Normocephalic and atraumatic.  Eyes: Conjunctivae and EOM are normal. Pupils are equal, round, and reactive to light.  Neck: Normal range of motion. Neck supple.  Cardiovascular: Normal rate, regular rhythm and normal heart sounds.   Pulmonary/Chest: Effort normal and breath sounds normal.  Abdominal: Soft. Bowel sounds are normal.       She has a moderate old stent is easily reducible at her epigastric port site. There is minor tenderness associated with it  Musculoskeletal: Normal range of motion.  Neurological: She is  alert and oriented to person, place, and time.  Skin: Skin is warm and dry.  Psychiatric: She has a normal mood and affect. Her behavior is normal.       Assessment:     The patient has a moderate sized ventral hernia at a previous epigastric port site. Because of the risk of incarceration strain patient I think she would benefit from having this fixed. She would also like to have this done. I have discussed with her in detail the risks and benefits of the operation to fix the hernia as well as some of the technical aspects and she understands and wishes to proceed. I think she would be a good laparoscopic candidate.    Plan:     We'll plan for laparoscopic ventral hernia repair with mesh in the  near future.

## 2011-07-17 ENCOUNTER — Encounter (HOSPITAL_COMMUNITY)
Admission: RE | Admit: 2011-07-17 | Discharge: 2011-07-17 | Disposition: A | Discharge status: Home / Self Care | Payer: BC Managed Care – PPO | Source: Ambulatory Visit | Attending: General Surgery | Admitting: General Surgery

## 2011-07-17 LAB — BASIC METABOLIC PANEL
BUN: 14 mg/dL (ref 6–23)
Calcium: 10.1 mg/dL (ref 8.4–10.5)
Creatinine, Ser: 1.04 mg/dL (ref 0.50–1.10)
GFR calc Af Amer: >60 mL/min (ref >60)
GFR calc non Af Amer: 54 mL/min — ABNORMAL LOW (ref >60)

## 2011-07-17 LAB — DIFFERENTIAL
Basophils Relative: 1 % (ref 0–1)
Eosinophils Absolute: 0.2 10*3/uL (ref 0.0–0.7)
Eosinophils Relative: 3 % (ref 0–5)
Lymphs Abs: 3.2 10*3/uL (ref 0.7–4.0)
Monocytes Relative: 6 % (ref 3–12)

## 2011-07-17 LAB — CBC
MCH: 26.3 pg (ref 26.0–34.0)
MCHC: 32.7 g/dL (ref 30.0–36.0)
MCV: 80.5 fL (ref 78.0–100.0)
Platelets: 270 10*3/uL (ref 150–400)
RDW: 17.2 % — ABNORMAL HIGH (ref 11.5–15.5)

## 2011-07-24 ENCOUNTER — Ambulatory Visit (HOSPITAL_COMMUNITY)
Admission: RE | Admit: 2011-07-24 | Discharge: 2011-07-26 | Disposition: A | Discharge status: Home / Self Care | Payer: BC Managed Care – PPO | Source: Ambulatory Visit | Attending: General Surgery | Admitting: General Surgery

## 2011-07-24 DIAGNOSIS — E119 Type 2 diabetes mellitus without complications: Secondary | ICD-10-CM | POA: Insufficient documentation

## 2011-07-24 DIAGNOSIS — IMO0001 Reserved for inherently not codable concepts without codable children: Secondary | ICD-10-CM | POA: Insufficient documentation

## 2011-07-24 DIAGNOSIS — I10 Essential (primary) hypertension: Secondary | ICD-10-CM | POA: Insufficient documentation

## 2011-07-24 DIAGNOSIS — Z01812 Encounter for preprocedural laboratory examination: Secondary | ICD-10-CM | POA: Insufficient documentation

## 2011-07-24 DIAGNOSIS — E669 Obesity, unspecified: Secondary | ICD-10-CM | POA: Insufficient documentation

## 2011-07-24 DIAGNOSIS — K7689 Other specified diseases of liver: Secondary | ICD-10-CM | POA: Insufficient documentation

## 2011-07-24 DIAGNOSIS — K439 Ventral hernia without obstruction or gangrene: Principal | ICD-10-CM | POA: Insufficient documentation

## 2011-07-24 HISTORY — PX: HERNIA REPAIR: SHX51

## 2011-07-24 LAB — GLUCOSE, CAPILLARY

## 2011-07-25 LAB — GLUCOSE, CAPILLARY

## 2011-07-26 LAB — GLUCOSE, CAPILLARY
Glucose-Capillary: 117 mg/dL — ABNORMAL HIGH (ref 70–99)
Glucose-Capillary: 131 mg/dL — ABNORMAL HIGH (ref 70–99)

## 2011-07-27 NOTE — Op Note (Signed)
NAMEHOLLYN, STUCKY NO.:  [id number]  MEDICAL RECORD NO.:  [id number]  LOCATION:  2899                         FACILITY:  [facility]  PHYSICIAN:  Ollen Gross. Vernell Morgans, M.D. DATE OF BIRTH:  1951/05/06  DATE OF PROCEDURE:  07/24/2011 DATE OF DISCHARGE:                              OPERATIVE REPORT   PREOPERATIVE DIAGNOSIS:  Ventral hernia.  POSTOPERATIVE DIAGNOSIS:  Ventral hernia.  PROCEDURE:  Laparoscopic ventral hernia repair with mesh.  SURGEON:  Ollen Gross. Vernell Morgans, MD  ASSISTANT:  Currie Paris, MD  ANESTHESIA:  General endotracheal.  PROCEDURE:  After informed consent was obtained, the patient was brought to the operating room, placed in supine position on the operating table. After adequate induction of general anesthesia, the patient's abdomen was prepped with ChloraPrep, allowed to dry, and draped in usual sterile manner.  The patient had, had a previous laparoscopic cholecystectomy and she had a ventral hernia at her epigastric port site.  She also had a small periumbilical ventral hernia.  We started by infiltrating the left abdomen with 0.25% Marcaine.  A small stab incision was made with a 15-blade knife and we used a 5-mm OptiVu to access the abdominal cavity under direct vision.  Once this was accomplished, we were able to insufflate the abdomen with carbon dioxide without difficulty.  A laparoscope was inserted through the 5-mm port.  The patient had some adhesions to the anterior abdominal wall, but there did not appear to be any involvement of colon or small bowel.  We placed a second 5-mm port just inferior to this port in the same manner under direct vision without difficulty.  We used a harmonic scalpel to take down the omental adhesions to the anterior abdominal wall.  This allowed [place] to see the entire anterior abdominal wall without difficulty.  The defect superiorly at the epigastric port site was probably 2 to 3 fingerbreadths in  diameter.  The periumbilical hernia was quite a bit smaller.  We chose a 15 x 20 piece of Physio mesh.  We placed eight #1 Novafil stitches at equidistant points around the periphery of the mesh. We then made a small incision overlying the epigastric fascial defect with a 15-blade knife.  This incision was carried down through the skin and subcutaneous tissue sharply with the electrocautery until the hernia was entered.  The hernia sac was excised sharply with the electrocautery.  The mesh was then oriented and placed in the abdomen where we wanted it.  The fascia at this point was then closed with interrupted #1 Novafil stitches.  We then reinsufflated the abdomen and using a suture passer and 11-blade knife, we made a small stab incision at each of the stitch sites.  We then used the suture passer to bring the tails of each stitch out.  Once the tails of all 8 stitches were passed then we placed traction on each of these stitches and there was good approximation of the mesh to the abdominal wall without any redundancy.  We tied down each of the stitches and divided the tails. We then placed another 5-mm port on the right side of the abdomen by infiltrating  with 0.25% Marcaine making a small stab incision and placing the port into the abdominal cavity under direct vision without difficulty.  We then used a superior strap tacker to tack the spots of the mesh in between each of the stitches so that there was no gaps in the mesh, no possibility for small bowel to get up onto the mesh.  Once this was accomplished, the mesh looked really good with no redundancy and it was nicely approximated in the abdominal wall.  The rest of the abdomen was inspected and everything looked hemostatic.  Bowel appeared to be healthy and no other abnormalities were noted.  At this point, the gas was allowed to escape.  The ports were removed.  The larger incisions were closed with 4-0 Monocryl subcuticular  stitches.  The smaller stabs were closed with Dermabond.  The one incision where we placed the mesh through the abdominal wall was closed with a deep layer of 3-0 Vicryl stitches and then 4-0 interrupted 4-0 Monocryl subcuticular stitches.  Dermabond dressings were applied.  The patient tolerated the procedure well.  At the end of the case, all needle, sponge, and instrument counts were correct.  The patient was then awakened and taken to recovery room in stable condition.     Ollen Gross. Vernell Morgans, M.D.     PST/MEDQ  D:  07/24/2011  T:  07/24/2011  Job:  [id number]  Electronically Signed by Chevis Pretty III M.D. on 07/27/2011 09:50:20 AM

## 2011-08-15 ENCOUNTER — Encounter (INDEPENDENT_AMBULATORY_CARE_PROVIDER_SITE_OTHER): Payer: Self-pay | Admitting: General Surgery

## 2011-08-15 ENCOUNTER — Ambulatory Visit (INDEPENDENT_AMBULATORY_CARE_PROVIDER_SITE_OTHER): Payer: BC Managed Care – PPO | Admitting: General Surgery

## 2011-08-15 VITALS — BP 132/74 | HR 92 | Temp 98.2°F | Resp 16 | Ht 64.0 in | Wt 218.4 lb

## 2011-08-15 DIAGNOSIS — K439 Ventral hernia without obstruction or gangrene: Secondary | ICD-10-CM

## 2011-08-15 NOTE — Patient Instructions (Signed)
No heavy lifting 

## 2011-08-15 NOTE — Progress Notes (Signed)
Subjective:     Patient ID: Grace Barry, female   DOB: 06-Jan-1951, 60 y.o.   MRN: [id number]  HPI The patient is a 60 year old white female who is now about 3 weeks out from a laparoscopic ventral hernia repair with mesh. She is doing great. She has only minor soreness at some of the anchor stitch sites. She is moving around very comfortably. She states that she still doesn't have a lot of energy. Her appetite is good and her bowels are working normally.  Review of Systems     Objective:   Physical Exam On exam her abdomen is soft and nontender. Her incisions are all healing nicely. There is no sign of infection. There is no palpable evidence of hernia.    Assessment:     3 weeks postop from a laparoscopic ventral hernia repair with mesh    Plan:     Overall she is doing great. I recommended continuing to refrain from any strenuous activity. We will plan to see her back in about one month.

## 2011-09-15 ENCOUNTER — Encounter (INDEPENDENT_AMBULATORY_CARE_PROVIDER_SITE_OTHER): Payer: Self-pay | Admitting: General Surgery

## 2011-09-15 ENCOUNTER — Ambulatory Visit (INDEPENDENT_AMBULATORY_CARE_PROVIDER_SITE_OTHER): Payer: BC Managed Care – PPO | Admitting: General Surgery

## 2011-09-15 VITALS — BP 152/80 | HR 104 | Temp 97.4°F | Resp 12 | Ht 64.0 in | Wt 215.6 lb

## 2011-09-15 DIAGNOSIS — K439 Ventral hernia without obstruction or gangrene: Secondary | ICD-10-CM

## 2011-09-15 NOTE — Progress Notes (Signed)
Subjective:     Patient ID: Grace Barry, female   DOB: 04/20/1951, 60 y.o.   MRN: [id number]  HPI The patient is a 60 year old white female who is now about 6 weeks out from a laparoscopic ventral hernia repair with mesh. She's doing very well. She has no complaints today. She is not having any pain. Her appetite is good and her bowels are working normally.  Review of Systems     Objective:   Physical Exam On exam her abdomen is soft and nontender. Her incisions have all healed nicely. Her abdominal wall feels very solid with no palpable evidence of recurrence of her hernia    Assessment:     6 weeks status post laparoscopic ventral hernia repair with mesh    Plan:     At this point she can return to her normal activities without any restrictions. We will plan to see her back on a p.r.n. basis

## 2011-09-15 NOTE — Patient Instructions (Signed)
May return to all normal activities 

## 2011-11-07 ENCOUNTER — Other Ambulatory Visit: Payer: Self-pay | Admitting: Internal Medicine

## 2011-11-07 DIAGNOSIS — R918 Other nonspecific abnormal finding of lung field: Secondary | ICD-10-CM

## 2011-11-09 ENCOUNTER — Ambulatory Visit
Admission: RE | Admit: 2011-11-09 | Discharge: 2011-11-09 | Disposition: A | Discharge status: Home / Self Care | Payer: BC Managed Care – PPO | Source: Ambulatory Visit | Attending: Internal Medicine | Admitting: Internal Medicine

## 2011-11-09 DIAGNOSIS — R918 Other nonspecific abnormal finding of lung field: Secondary | ICD-10-CM

## 2011-12-31 ENCOUNTER — Ambulatory Visit (INDEPENDENT_AMBULATORY_CARE_PROVIDER_SITE_OTHER): Payer: BC Managed Care – PPO | Admitting: Internal Medicine

## 2011-12-31 ENCOUNTER — Ambulatory Visit: Payer: BC Managed Care – PPO

## 2011-12-31 VITALS — BP 131/77 | HR 103 | Temp 98.7°F | Resp 18 | Ht 63.0 in | Wt 224.2 lb

## 2011-12-31 DIAGNOSIS — IMO0001 Reserved for inherently not codable concepts without codable children: Secondary | ICD-10-CM

## 2011-12-31 DIAGNOSIS — J449 Chronic obstructive pulmonary disease, unspecified: Secondary | ICD-10-CM

## 2011-12-31 DIAGNOSIS — R05 Cough: Secondary | ICD-10-CM

## 2011-12-31 DIAGNOSIS — J209 Acute bronchitis, unspecified: Secondary | ICD-10-CM

## 2011-12-31 DIAGNOSIS — J40 Bronchitis, not specified as acute or chronic: Secondary | ICD-10-CM

## 2011-12-31 DIAGNOSIS — E119 Type 2 diabetes mellitus without complications: Secondary | ICD-10-CM

## 2011-12-31 LAB — POCT CBC
HCT, POC: 39.7 % (ref 37.7–47.9)
Hemoglobin: 13.1 g/dL (ref 12.2–16.2)
Lymph, poc: 1.8 (ref 0.6–3.4)
MCHC: 33 g/dL (ref 31.8–35.4)
MCV: 82.3 fL (ref 80–97)
POC Granulocyte: 5.1 (ref 2–6.9)
POC LYMPH PERCENT: 23.9 %L (ref 10–50)
RDW, POC: 16 %

## 2011-12-31 LAB — GLUCOSE, POCT (MANUAL RESULT ENTRY): POC Glucose: 101

## 2011-12-31 MED ORDER — BENZONATATE 100 MG PO CAPS: 100.0000 mg | ORAL_CAPSULE | Freq: Two times a day (BID) | ORAL | Status: AC | PRN

## 2011-12-31 MED ORDER — SULFAMETHOXAZOLE-TRIMETHOPRIM 800-160 MG PO TABS: 1.0000 | ORAL_TABLET | Freq: Two times a day (BID) | ORAL | Status: AC

## 2011-12-31 MED ORDER — AZITHROMYCIN 500 MG PO TABS: 500.0000 mg | ORAL_TABLET | Freq: Every day | ORAL | Status: DC

## 2011-12-31 MED ORDER — ALBUTEROL SULFATE (2.5 MG/3ML) 0.083% IN NEBU
2.5000 mg | INHALATION_SOLUTION | Freq: Once | RESPIRATORY_TRACT | Status: AC
  Administered 2011-12-31: 2.5 mg via RESPIRATORY_TRACT

## 2011-12-31 MED ORDER — ALBUTEROL SULFATE (2.5 MG/3ML) 0.083% IN NEBU: 2.5000 mg | INHALATION_SOLUTION | Freq: Four times a day (QID) | RESPIRATORY_TRACT | Status: DC | PRN

## 2011-12-31 NOTE — Patient Instructions (Signed)
Bactrim ds 1 tab 2 times daily for 10 days. Tessalon perle 1 tab every 8 hours as needed for cough. Nebulizer as directed.

## 2011-12-31 NOTE — Progress Notes (Signed)
  Subjective:    Patient ID: Grace Barry, female    DOB: 08-01-1951, 61 y.o.   MRN: [id number]  HPI Cough shortness of breath 4 days, no sputum, chest tightness, moderate in severity, awoke at 4 am and had to take a nebulizer treatment. Chills. Leaving for [place] on Thursday. Worried she has poneumonia. No chest pain nausea or diaphoresis, has chills may have low grade temp at home.   Review of Systems  Unable to perform ROS Constitutional: Positive for chills, activity change, appetite change and fatigue. Negative for fever and diaphoresis.  HENT: Negative.   Eyes: Negative.   Respiratory: Positive for cough, chest tightness, shortness of breath and wheezing.   Cardiovascular: Negative.   Gastrointestinal: Negative.   Genitourinary: Negative.   Musculoskeletal: Negative.   Skin: Negative.   Neurological: Negative.   Hematological: Negative.   Psychiatric/Behavioral: Negative.   All other systems reviewed and are negative.       Objective:   Physical Exam  Nursing note and vitals reviewed. Constitutional: She is oriented to person, place, and time. She appears well-developed and well-nourished.  HENT:  Head: Normocephalic and atraumatic.  Right Ear: External ear normal.  Left Ear: External ear normal.  Eyes: Conjunctivae and EOM are normal. Pupils are equal, round, and reactive to light.  Neck: Normal range of motion. Neck supple.  Cardiovascular: Normal rate, regular rhythm and normal heart sounds.   Pulmonary/Chest: Effort normal. She has wheezes. She has no rales. She exhibits no tenderness.  Abdominal: Soft. Bowel sounds are normal.  Musculoskeletal: Normal range of motion.  Neurological: She is alert and oriented to person, place, and time.  Skin: Skin is warm and dry.  Psychiatric: She has a normal mood and affect. Her behavior is normal. Judgment and thought content normal.   [facility] reading (PRIMARY) by  Dr. Mindi Junker.increased bronchial markings no  pneumonia   Results for orders placed in visit on 12/31/11  POCT CBC      Component Value Range   WBC 7.6  4.6 - 10.2 (K/uL)   Lymph, poc 1.8  0.6 - 3.4    POC LYMPH PERCENT 23.9  10 - 50 (%L)   MID (cbc) 0.6  0 - 0.9    POC MID % 8.5  0 - 12 (%M)   POC Granulocyte 5.1  2 - 6.9    Granulocyte percent 67.6  37 - 80 (%G)   RBC 4.82  4.04 - 5.48 (M/uL)   Hemoglobin 13.1  12.2 - 16.2 (g/dL)   HCT, POC 78.2  95.6 - 47.9 (%)   MCV 82.3  80 - 97 (fL)   MCH, POC 27.2  27 - 31.2 (pg)   MCHC 33.0  31.8 - 35.4 (g/dL)   RDW, POC 21.3     Platelet Count, POC 271  142 - 424 (K/uL)   MPV 9.4  0 - 99.8 (fL)  GLUCOSE, POCT (MANUAL RESULT ENTRY)      Component Value Range   POC Glucose 101     visit     Assessment & Plan:  Cough Shortness of breath Plan xray nebulizer rx with bronchodilators/antibiotics. Will avoid prednisone because of patients diabeties and travel plans out of the country this week

## 2012-09-02 LAB — HM MAMMOGRAPHY: HM Mammogram: NORMAL

## 2012-11-07 ENCOUNTER — Encounter: Payer: Self-pay | Admitting: Gastroenterology

## 2013-01-31 ENCOUNTER — Other Ambulatory Visit (HOSPITAL_COMMUNITY): Payer: Self-pay | Admitting: Internal Medicine

## 2013-01-31 DIAGNOSIS — E041 Nontoxic single thyroid nodule: Secondary | ICD-10-CM

## 2013-02-03 ENCOUNTER — Other Ambulatory Visit (HOSPITAL_COMMUNITY): Payer: Self-pay | Admitting: Internal Medicine

## 2013-02-04 ENCOUNTER — Other Ambulatory Visit (HOSPITAL_COMMUNITY): Payer: Self-pay | Admitting: Internal Medicine

## 2013-02-04 ENCOUNTER — Ambulatory Visit (HOSPITAL_COMMUNITY): Payer: BC Managed Care – PPO

## 2013-02-06 ENCOUNTER — Ambulatory Visit (HOSPITAL_COMMUNITY)
Admission: RE | Admit: 2013-02-06 | Discharge: 2013-02-06 | Disposition: A | Discharge status: Home / Self Care | Payer: BC Managed Care – PPO | Source: Ambulatory Visit | Attending: Internal Medicine | Admitting: Internal Medicine

## 2013-02-06 DIAGNOSIS — E041 Nontoxic single thyroid nodule: Secondary | ICD-10-CM | POA: Insufficient documentation

## 2013-04-25 ENCOUNTER — Encounter: Payer: Self-pay | Admitting: Gastroenterology

## 2013-10-14 ENCOUNTER — Encounter: Payer: Self-pay | Admitting: Internal Medicine

## 2013-10-14 DIAGNOSIS — K760 Fatty (change of) liver, not elsewhere classified: Secondary | ICD-10-CM | POA: Insufficient documentation

## 2013-10-14 DIAGNOSIS — I10 Essential (primary) hypertension: Secondary | ICD-10-CM | POA: Insufficient documentation

## 2013-10-14 DIAGNOSIS — F329 Major depressive disorder, single episode, unspecified: Secondary | ICD-10-CM | POA: Insufficient documentation

## 2013-10-14 DIAGNOSIS — M1990 Unspecified osteoarthritis, unspecified site: Secondary | ICD-10-CM | POA: Insufficient documentation

## 2013-10-15 ENCOUNTER — Ambulatory Visit (INDEPENDENT_AMBULATORY_CARE_PROVIDER_SITE_OTHER): Payer: BC Managed Care – PPO | Admitting: Physician Assistant

## 2013-10-15 ENCOUNTER — Encounter: Payer: Self-pay | Admitting: Physician Assistant

## 2013-10-15 VITALS — BP 138/78 | HR 88 | Temp 98.1°F | Resp 16 | Ht 64.0 in | Wt 224.0 lb

## 2013-10-15 DIAGNOSIS — Z79899 Other long term (current) drug therapy: Secondary | ICD-10-CM

## 2013-10-15 DIAGNOSIS — E039 Hypothyroidism, unspecified: Secondary | ICD-10-CM

## 2013-10-15 DIAGNOSIS — E119 Type 2 diabetes mellitus without complications: Secondary | ICD-10-CM

## 2013-10-15 DIAGNOSIS — I10 Essential (primary) hypertension: Secondary | ICD-10-CM

## 2013-10-15 DIAGNOSIS — E782 Mixed hyperlipidemia: Secondary | ICD-10-CM

## 2013-10-15 DIAGNOSIS — E785 Hyperlipidemia, unspecified: Secondary | ICD-10-CM

## 2013-10-15 DIAGNOSIS — E559 Vitamin D deficiency, unspecified: Secondary | ICD-10-CM

## 2013-10-15 LAB — BASIC METABOLIC PANEL WITH GFR
BUN: 13 mg/dL (ref 6–23)
CO2: 26 mEq/L (ref 19–32)
Glucose, Bld: 111 mg/dL — ABNORMAL HIGH (ref 70–99)
Potassium: 4.4 mEq/L (ref 3.5–5.3)
Sodium: 137 mEq/L (ref 135–145)

## 2013-10-15 LAB — CBC WITH DIFFERENTIAL/PLATELET
HCT: 38.3 % (ref 36.0–46.0)
Hemoglobin: 12.6 g/dL (ref 12.0–15.0)
Lymphs Abs: 3.7 10*3/uL (ref 0.7–4.0)
MCH: 27.5 pg (ref 26.0–34.0)
Monocytes Absolute: 0.5 10*3/uL (ref 0.1–1.0)
Monocytes Relative: 5 % (ref 3–12)
Neutro Abs: 6 10*3/uL (ref 1.7–7.7)
Neutrophils Relative %: 56 % (ref 43–77)
RBC: 4.59 MIL/uL (ref 3.87–5.11)

## 2013-10-15 LAB — LIPID PANEL
LDL Cholesterol: 40 mg/dL (ref 0–99)
Triglycerides: 174 mg/dL — ABNORMAL HIGH (ref <150)
VLDL: 35 mg/dL (ref 0–40)

## 2013-10-15 LAB — HEPATIC FUNCTION PANEL
ALT: 28 U/L (ref 0–35)
AST: 24 U/L (ref 0–37)
Alkaline Phosphatase: 106 U/L (ref 39–117)
Bilirubin, Direct: 0.1 mg/dL (ref 0.0–0.3)
Indirect Bilirubin: 0.5 mg/dL (ref 0.0–0.9)

## 2013-10-15 LAB — HEMOGLOBIN A1C
Hgb A1c MFr Bld: 6.4 % — ABNORMAL HIGH (ref <5.7)
Mean Plasma Glucose: 137 mg/dL — ABNORMAL HIGH (ref <117)

## 2013-10-15 LAB — TSH: TSH: 1.099 u[IU]/mL (ref 0.350–4.500)

## 2013-10-15 MED ORDER — CANAGLIFLOZIN-METFORMIN HCL 150-1000 MG PO TABS: ORAL_TABLET | ORAL | Status: DC

## 2013-10-15 MED ORDER — SULFAMETHOXAZOLE-TRIMETHOPRIM 400-80 MG PO TABS: 1.0000 | ORAL_TABLET | Freq: Two times a day (BID) | ORAL | Status: AC

## 2013-10-15 MED ORDER — ESTRADIOL 1 MG PO TABS: ORAL_TABLET | ORAL | Status: DC

## 2013-10-15 MED ORDER — PROMETHAZINE HCL 25 MG PO TABS: ORAL_TABLET | ORAL | Status: DC

## 2013-10-15 MED ORDER — AZITHROMYCIN 250 MG PO TABS: ORAL_TABLET | ORAL | Status: AC

## 2013-10-15 NOTE — Progress Notes (Signed)
HPI Patient presents for 3 month follow up with hypertension, hyperlipidemia, diabetes and vitamin D. Patient's blood pressure has been controlled at home, today their BP is  Patient denies chest pain, shortness of breath, dizziness.  Patient's cholesterol is diet controlled. In addition they are on crestor 20  and denies myalgias. The cholesterol last visit was LDL is 41, HDL 39, Trig is 166.  The patient has been working on diet and exercise for Diabetes, and denies changes in vision, polys, and paresthesias. Last A1C was 7.1. Her sugars at home have been running in the 120's.  Patient is on Vitamin D supplement.  Last vitamin D was 79.  Patient has been taking care of her son that has cancer. She was out of town taking care of him in a hotel but they have now recently moved her son in with her and her husband is helping but she states that she is still emotionally drained and tired all of the time. She has a small tremor here in the office and states she has been constantly cold and clammy at home.  She has had cold symptoms since Saturday.  Patient states that she got a new CPAP and that the pressure is adjusting and she states she is sleeping much better and feels better.  Her TSH last visit was 0.177 and we changed her levothyroxine dose to 1/2 M,W,F and 1 pill T,T,S,S.  Current Medications:  Current Outpatient Prescriptions on File Prior to Visit  Medication Sig Dispense Refill  . albuterol (PROVENTIL) (2.5 MG/3ML) 0.083% nebulizer solution Take 3 mLs (2.5 mg total) by nebulization every 6 (six) hours as needed for wheezing.  150 mL  1  . ALPRAZolam (XANAX) 0.5 MG tablet Ad lib.      . B-D UF III MINI PEN NEEDLES 31G X 5 MM MISC Ad lib.      . Beclomethasone Dipropionate (QVAR IN) Inhale into the lungs as needed.        . CRESTOR 20 MG tablet Daily.      . CYMBALTA 60 MG capsule Daily.      Marland Kitchen esomeprazole (NEXIUM) 40 MG capsule Take 40 mg by mouth daily before breakfast.        .  levocetirizine (XYZAL) 5 MG tablet Take 5 mg by mouth as needed.        Marland Kitchen liothyronine (CYTOMEL) 25 MCG tablet 12.5 mg BID times 48H.      . meloxicam (MOBIC) 15 MG tablet Daily.      . metFORMIN (GLUCOPHAGE) 500 MG tablet BID times 48H.      . ONE TOUCH ULTRA TEST test strip Daily.      Marland Kitchen SYNTHROID 175 MCG tablet Daily.       No current facility-administered medications on file prior to visit.   Medical History:  Past Medical History  Diagnosis Date  . Sleep difficulties   . Liver disease   . Ventral hernia   . Hyperlipidemia   . Hypertension   . GERD (gastroesophageal reflux disease)   . Type II or unspecified type diabetes mellitus without mention of complication, not stated as uncontrolled   . Thyroid disease     hypothyroidism  . Arthritis   . Asthma   . Depression    Allergies:  Allergies  Allergen Reactions  . Floxin [Ofloxacin] Hives    ROS Constitutional: + hot flashes and fatigue Denies fever, chills, headaches, insomnia, night sweats Eyes: Denies redness, blurred vision, diplopia, discharge, itchy, watery  eyes.  ENT: + post nasal drip Denies congestion, sore throat, earache, dental pain, Tinnitus, Vertigo, Sinus pain, snoring.  Cardio: Denies chest pain, palpitations, irregular heartbeat, dyspnea, diaphoresis, orthopnea, PND, claudication, edema Respiratory: denies cough, shortness of breath, wheezing.  Gastrointestinal: Denies dysphagia, heartburn, AB pain/ cramps, N/V, diarrhea, constipation, hematemesis, melena, hematochezia,  hemorrhoids Genitourinary: Denies dysuria, frequency, urgency, nocturia, hesitancy, discharge, hematuria, flank pain Musculoskeletal: Denies myalgia, stiffness, pain, swelling and strain/sprain. Skin: Denies pruritis, rash, changing in skin lesion Neuro: Denies Weakness, tremor, incoordination, spasms, pain Psychiatric: Denies confusion, memory loss, sensory loss Endocrine: Denies change in weight, skin, hair change, nocturia Diabetic  Polys, Denies visual blurring, hyper /hypo glycemic episodes, and paresthesia, Heme/Lymph: Denies Excessive bleeding, bruising, enlarged lymph nodes  Family history- Review and unchanged Social history- Review and unchanged Physical Exam: Filed Vitals:   10/15/13 1145  BP: 138/78  Pulse: 88  Temp: 98.1 F (36.7 C)  Resp: 16   Filed Weights   10/15/13 1145  Weight: 224 lb (101.606 kg)   General Appearance: Well nourished, in no apparent distress. Eyes: PERRLA, EOMs, conjunctiva no swelling or erythema Sinuses: No Frontal/maxillary tenderness ENT/Mouth: Ext aud canals clear, TMs without erythema, bulging. No erythema, swelling, or exudate on post pharynx.  Tonsils not swollen or erythematous. Hearing normal.  Neck: Supple, thyroid normal.  Respiratory: Respiratory effort normal, BS equal bilaterally without rales, rhonchi, wheezing or stridor.  Cardio: RRR with no MRGs. Brisk peripheral pulses without edema.  Abdomen: Soft, + BS.  Non tender, no guarding, rebound, hernias, masses. Lymphatics: Non tender without lymphadenopathy.  Musculoskeletal: Full ROM, 5/5 strength, normal gait.  Skin: Warm, dry without rashes, lesions, ecchymosis.  Neuro: Cranial nerves intact. No cerebellar symptoms. Sensation intact.  Psych: Awake and oriented X 3, normal affect, Insight and Judgment appropriate.   Assessment and Plan:  Hypertension: Continue medication, monitor blood pressure at home.  Continue DASH diet. Cholesterol: Continue diet and exercise. Check cholesterol.  Diabetes-Continue diet and exercise. Check A1C- swtich to Invokamet BID.  Hypothyroid- recheck level especially with tremor and clamminess but could also be menopausal- check labs She also states she use to be on estrogen for hot flashes and would like to get back on it. She had a hysterectomy, we will start her on low dose. No personal history of breast CA or blood clots, she is on BASA and gets mammograms regularly.  Vitamin D  Def- check level and continue medications.  Patient is going on a trip to [place] and I filled prescriptions for the trip- zpak, bactrim, and phenergan.  Continue diet and meds as discussed. Further disposition pending results of labs. Discussed med's effects and SE's.    Quentin Mulling 11:54 AM

## 2013-10-15 NOTE — Patient Instructions (Signed)
 Bad carbs also include fruit juice, alcohol, and sweet tea. These are empty calories that do not signal to your brain that you are full.   Please remember the good carbs are still carbs which convert into sugar. So please measure them out no more than 1/2-1 cup of rice, oatmeal, pasta, and beans.  Veggies are however free foods! Pile them on.   I like lean protein at every meal such as chicken, turkey, pork chops, cottage cheese, etc. Just do not fry these meats and please center your meal around vegetable, the meats should be a side dish.   No all fruit is created equal. Please see the list below, the fruit at the bottom is higher in sugars than the fruit at the top   VAGINAL DRYNESS OVERVIEW  Vaginal dryness, also known as atrophic vaginitis, is a common condition in postmenopausal women. This condition is also common in women who have had both ovaries removed at the time of hysterectomy.   Some women have uncomfortable symptoms of vaginal dryness, such as pain with sex, burning vaginal discomfort or itching, or abnormal vaginal discharge, while others have no symptoms at all.  VAGINAL DRYNESS CAUSES   Estrogen helps to keep the vagina moist and to maintain thickness of the vaginal lining. Vaginal dryness occurs when the ovaries produce a decreased amount of estrogen. This can occur at certain times in a woman's life, and may be permanent or temporary. Times when less estrogen is made include: ?At the time of menopause. ?After surgical removal of the ovaries, chemotherapy, or radiation therapy of the pelvis for cancer. ?After having a baby, particularly in women who breastfeed. ?While using certain medications, such as danazol, medroxyprogesterone (brand names: Provera or DepoProvera), leuprolide (brand name: Lupron), or nafarelin. When these medications are stopped, estrogen production resumes.  Women who smoke cigarettes have been shown to have an increased risk of an earlier menopause  transition as compared to non-smokers. Therefore, atrophic vaginitis symptoms may appear at a younger age in this population.  VAGINAL DRYNESS TREATMENT   There are three treatment options for women with vaginal dryness:  Vaginal lubricants and moisturizers - Vaginal lubricants and moisturizers can be purchased without a prescription. These products do not contain any hormones and have virtually no side effects. - Albolene is found in the facial cleanser section at [facility], [facility], or [employer]. It is a large jar with a blue top. This is the best lubricant for women because it is hypoallergenic. -Natural lubricants, such as olive, avocado or peanut oil, are easily available products that may be used as a lubricant with sex.  -Vaginal moisturizes (eg, Replens, Moist Again, Vagisil, K-Y Silk-E, and Feminease) are formulated to allow water to be retained in the vaginal tissues. Moisturizers are applied into the vagina three times weekly to allow a continued moisturizing effect. These should not be used just before having sex, as they can be irritating.  Vaginal estrogen - Vaginal estrogen is the most effective treatment option for women with vaginal dryness. Vaginal estrogen must be prescribed by a healthcare provider. Very low doses of vaginal estrogen can be used when it is put into the vagina to treat vaginal dryness. A small amount of estrogen is absorbed into the bloodstream, but only about 100 times less than when using estrogen pills or tablets. As a result, there is a much lower risk of side effects, such as blood clots, breast cancer, and heart attack, compared with other estrogen-containing products (birth control pills,   menopausal hormone therapy).   Ospemifene - Ospemifene is a prescription medication that is similar to estrogen, but is not estrogen. In the vaginal tissue, it acts similarly to estrogen. In the breast tissue, it acts as an estrogen blocker. It comes in a pill, and is prescribed for  women who want to use an estrogen-like medication for vaginal dryness or painful sex associated with vaginal dryness, but prefer not to use a vaginal medication. The medication may cause hot flashes as a side effect. This type of medication may increase the risk of blood clots or uterine cancer. Further study of ospemifene is needed to evaluate the risk of these complications. This medication has not been tested in women who have had breast cancer or are at a high risk of developing breast cancer.    Sexual activity - Vaginal estrogen improves vaginal dryness quickly, usually within a few weeks. You may continue to have sex as you treat vaginal dryness because sex itself can help to keep the vaginal tissues healthy. Vaginal intercourse may help the vaginal tissues by keeping them soft and stretchable and preventing the tissues from shrinking.  If sex continues to be painful despite treatment for vaginal dryness, talk to your healthcare provider.

## 2013-11-17 ENCOUNTER — Ambulatory Visit: Payer: Self-pay | Admitting: Internal Medicine

## 2013-11-28 ENCOUNTER — Other Ambulatory Visit: Payer: Self-pay | Admitting: Physician Assistant

## 2013-12-05 ENCOUNTER — Other Ambulatory Visit: Payer: Self-pay | Admitting: Physician Assistant

## 2013-12-06 ENCOUNTER — Encounter: Payer: Self-pay | Admitting: Physician Assistant

## 2013-12-08 ENCOUNTER — Other Ambulatory Visit: Payer: Self-pay | Admitting: Physician Assistant

## 2014-02-11 ENCOUNTER — Ambulatory Visit (INDEPENDENT_AMBULATORY_CARE_PROVIDER_SITE_OTHER): Payer: BC Managed Care – PPO | Admitting: Physician Assistant

## 2014-02-11 ENCOUNTER — Encounter: Payer: Self-pay | Admitting: Physician Assistant

## 2014-02-11 VITALS — BP 110/78 | HR 84 | Temp 97.7°F | Resp 16 | Wt 220.0 lb

## 2014-02-11 DIAGNOSIS — G4733 Obstructive sleep apnea (adult) (pediatric): Secondary | ICD-10-CM

## 2014-02-11 DIAGNOSIS — E559 Vitamin D deficiency, unspecified: Secondary | ICD-10-CM

## 2014-02-11 DIAGNOSIS — E119 Type 2 diabetes mellitus without complications: Secondary | ICD-10-CM

## 2014-02-11 DIAGNOSIS — E785 Hyperlipidemia, unspecified: Secondary | ICD-10-CM

## 2014-02-11 DIAGNOSIS — F3289 Other specified depressive episodes: Secondary | ICD-10-CM

## 2014-02-11 DIAGNOSIS — F329 Major depressive disorder, single episode, unspecified: Secondary | ICD-10-CM

## 2014-02-11 DIAGNOSIS — M26609 Unspecified temporomandibular joint disorder, unspecified side: Secondary | ICD-10-CM

## 2014-02-11 DIAGNOSIS — E039 Hypothyroidism, unspecified: Secondary | ICD-10-CM

## 2014-02-11 DIAGNOSIS — I10 Essential (primary) hypertension: Secondary | ICD-10-CM

## 2014-02-11 DIAGNOSIS — Z79899 Other long term (current) drug therapy: Secondary | ICD-10-CM

## 2014-02-11 LAB — CBC WITH DIFFERENTIAL/PLATELET
Basophils Absolute: 0 10*3/uL (ref 0.0–0.1)
Basophils Relative: 0 % (ref 0–1)
Eosinophils Absolute: 0.2 10*3/uL (ref 0.0–0.7)
Eosinophils Relative: 2 % (ref 0–5)
HCT: 39.3 % (ref 36.0–46.0)
HEMOGLOBIN: 12.9 g/dL (ref 12.0–15.0)
LYMPHS ABS: 3.8 10*3/uL (ref 0.7–4.0)
Lymphocytes Relative: 32 % (ref 12–46)
MCH: 25.6 pg — AB (ref 26.0–34.0)
MCHC: 32.8 g/dL (ref 30.0–36.0)
MCV: 78 fL (ref 78.0–100.0)
Monocytes Absolute: 0.6 10*3/uL (ref 0.1–1.0)
Monocytes Relative: 5 % (ref 3–12)
NEUTROS ABS: 7.3 10*3/uL (ref 1.7–7.7)
NEUTROS PCT: 61 % (ref 43–77)
Platelets: 334 10*3/uL (ref 150–400)
RBC: 5.04 MIL/uL (ref 3.87–5.11)
RDW: 16.7 % — ABNORMAL HIGH (ref 11.5–15.5)
WBC: 12 10*3/uL — ABNORMAL HIGH (ref 4.0–10.5)

## 2014-02-11 LAB — BASIC METABOLIC PANEL WITH GFR
BUN: 11 mg/dL (ref 6–23)
CO2: 26 meq/L (ref 19–32)
CREATININE: 0.94 mg/dL (ref 0.50–1.10)
Calcium: 9.3 mg/dL (ref 8.4–10.5)
Chloride: 101 mEq/L (ref 96–112)
GFR, Est African American: 74 mL/min
GFR, Est Non African American: 64 mL/min
GLUCOSE: 125 mg/dL — AB (ref 70–99)
Potassium: 3.9 mEq/L (ref 3.5–5.3)
Sodium: 137 mEq/L (ref 135–145)

## 2014-02-11 LAB — HEPATIC FUNCTION PANEL
ALBUMIN: 4.4 g/dL (ref 3.5–5.2)
ALK PHOS: 108 U/L (ref 39–117)
ALT: 17 U/L (ref 0–35)
AST: 22 U/L (ref 0–37)
Bilirubin, Direct: 0.1 mg/dL (ref 0.0–0.3)
Indirect Bilirubin: 0.5 mg/dL (ref 0.2–1.2)
TOTAL PROTEIN: 6.9 g/dL (ref 6.0–8.3)
Total Bilirubin: 0.6 mg/dL (ref 0.2–1.2)

## 2014-02-11 LAB — LIPID PANEL
CHOL/HDL RATIO: 2.8 ratio
Cholesterol: 123 mg/dL (ref 0–200)
HDL: 44 mg/dL (ref >39)
LDL CALC: 38 mg/dL (ref 0–99)
Triglycerides: 205 mg/dL — ABNORMAL HIGH (ref <150)
VLDL: 41 mg/dL — ABNORMAL HIGH (ref 0–40)

## 2014-02-11 LAB — MAGNESIUM: Magnesium: 1.9 mg/dL (ref 1.5–2.5)

## 2014-02-11 LAB — TSH: TSH: 9.869 u[IU]/mL — AB (ref 0.350–4.500)

## 2014-02-11 LAB — HEMOGLOBIN A1C
Hgb A1c MFr Bld: 6.4 % — ABNORMAL HIGH (ref <5.7)
Mean Plasma Glucose: 137 mg/dL — ABNORMAL HIGH (ref <117)

## 2014-02-11 MED ORDER — CYCLOBENZAPRINE HCL 10 MG PO TABS: ORAL_TABLET | ORAL | Status: DC

## 2014-02-11 NOTE — Patient Instructions (Signed)
What is the TMJ? The temporomandibular (tem-PUH-ro-man-DIB-yoo-ler) joint, or the TMJ, connects the upper and lower jawbones. This joint allows the jaw to open wide and move back and forth when you chew, talk, or yawn.There are also several muscles that help this joint move. There can be muscle tightness and pain in the muscle that can cause several symptoms.  What causes TMJ pain? There are many causes of TMJ pain. Repeated chewing (for example, chewing gum) and clenching your teeth can cause pain in the joint. Some TMJ pain has no obvious cause. What can I do to ease the pain? There are many things you can do to help your pain get better. When you have pain:  Eat soft foods and stay away from chewy foods (for example, taffy) Try to use both sides of your mouth to chew Don't chew gum Don't open your mouth wide (for example, during yawning or singing) Don't bite your cheeks or fingernails Lower your amount of stress and worry Applying a warm, damp washcloth to the joint may help. Over-the-counter pain medicines such as ibuprofen (one brand: Advil) or acetaminophen (one brand: Tylenol) might also help. Do not use these medicines if you are allergic to them or if your doctor told you not to use them. How can I stop the pain from coming back? When your pain is better, you can do these exercises to make your muscles stronger and to keep the pain from coming back:  Resisted mouth opening: Place your thumb or two fingers under your chin and open your mouth slowly, pushing up lightly on your chin with your thumb. Hold for three to six seconds. Close your mouth slowly. Resisted mouth closing: Place your thumbs under your chin and your two index fingers on the ridge between your mouth and the bottom of your chin. Push down lightly on your chin as you close your mouth. Tongue up: Slowly open and close your mouth while keeping the tongue touching the roof of the mouth. Side-to-side jaw movement: Place an  object about one fourth of an inch thick (for example, two tongue depressors) between your front teeth. Slowly move your jaw from side to side. Increase the thickness of the object as the exercise becomes easier Forward jaw movement: Place an object about one fourth of an inch thick between your front teeth and move the bottom jaw forward so that the bottom teeth are in front of the top teeth. Increase the thickness of the object as the exercise becomes easier. These exercises should not be painful. If it hurts to do these exercises, stop doing them and talk to your family doctor.     Bad carbs also include fruit juice, alcohol, and sweet tea. These are empty calories that do not signal to your brain that you are full.   Please remember the good carbs are still carbs which convert into sugar. So please measure them out no more than 1/2-1 cup of rice, oatmeal, pasta, and beans.  Veggies are however free foods! Pile them on.   I like lean protein at every meal such as chicken, turkey, pork chops, cottage cheese, etc. Just do not fry these meats and please center your meal around vegetable, the meats should be a side dish.   No all fruit is created equal. Please see the list below, the fruit at the bottom is higher in sugars than the fruit at the top    

## 2014-02-11 NOTE — Progress Notes (Signed)
HPI 63 y.o. female  presents for 3 month follow up with hypertension, hyperlipidemia, diabetes and vitamin D. Her blood pressure has been controlled at home, today their BP is BP: 110/78 mmHg She does not workout. She denies chest pain, shortness of breath, dizziness.  She is on cholesterol medication and denies myalgias. Her cholesterol is not at goal. The cholesterol last visit was:   Lab Results  Component Value Date   CHOL 114 10/15/2013   HDL 39* 10/15/2013   LDLCALC 40 10/15/2013   TRIG 174* 10/15/2013   CHOLHDL 2.9 10/15/2013   She has been working on diet and exercise for Diabetes, and denies nausea, paresthesia of the feet, polydipsia and polyuria. Last A1C in the office was:  Lab Results  Component Value Date   HGBA1C 6.4* 10/15/2013   Patient is on Vitamin D supplement.   She is on thyroid medication. Her medication was not changed last visit. Patient denies diarrhea, heat / cold intolerance, nervousness and palpitations.  Lab Results  Component Value Date   TSH 1.099 10/15/2013  .  Last visit she was started on estrogen 1mg  for her hot flashes, mood, etc- she is doing well with it, she is on basa.  She has been having what she thought was sinus pain for 4 months intermittently, she has been on several ABX, saw ENT had CT neg. She states it is frontal headache, very sharp with some temples, some occipital tenderness. She states that it can be tender to touch. Denies vision changes, very little nasal congestion.  Her son has been battling cancer for the last 14 months but recently passed, she is seeing hospice counseling and a personal counseling.    Current Medications:  Current Outpatient Prescriptions on File Prior to Visit  Medication Sig Dispense Refill  . albuterol (PROVENTIL) (2.5 MG/3ML) 0.083% nebulizer solution Take 3 mLs (2.5 mg total) by nebulization every 6 (six) hours as needed for wheezing.  150 mL  1  . ALPRAZolam (XANAX) 0.5 MG tablet Ad lib.      . B-D UF  III MINI PEN NEEDLES 31G X 5 MM MISC Ad lib.      . Beclomethasone Dipropionate (QVAR IN) Inhale into the lungs as needed.        . Canagliflozin-Metformin HCl [phone] MG TABS 1 pill twice daily with food  60 tablet  3  . CRESTOR 20 MG tablet Daily.      . DULoxetine (CYMBALTA) 60 MG capsule TAKE ONE CAPSULE BY MOUTH EVERY DAY  90 capsule  3  . esomeprazole (NEXIUM) 40 MG capsule Take 40 mg by mouth daily before breakfast.        . estradiol (ESTRACE) 1 MG tablet 1/2-1 pill daily  30 tablet  3  . hydrochlorothiazide (HYDRODIURIL) 25 MG tablet Take 25 mg by mouth daily. As Needed      . levocetirizine (XYZAL) 5 MG tablet Take 5 mg by mouth as needed.        Marland Kitchen liothyronine (CYTOMEL) 25 MCG tablet 12.5 mg BID times 48H.      . meloxicam (MOBIC) 15 MG tablet Daily.      . metFORMIN (GLUCOPHAGE) 500 MG tablet BID times 48H.      . ONE TOUCH ULTRA TEST test strip Daily.      . promethazine (PHENERGAN) 25 MG tablet 1/2-1 po PRN q 6 hours for nausea  30 tablet  0  . SYNTHROID 200 MCG tablet TAKE 1 TABLET BY MOUTH EVERY DAY  90 tablet  1  . valsartan (DIOVAN) 320 MG tablet Take 320 mg by mouth daily.       No current facility-administered medications on file prior to visit.   Medical History:  Past Medical History  Diagnosis Date  . Sleep difficulties   . Liver disease   . Ventral hernia   . Hyperlipidemia   . Hypertension   . GERD (gastroesophageal reflux disease)   . Type II or unspecified type diabetes mellitus without mention of complication, not stated as uncontrolled   . Thyroid disease     hypothyroidism  . Arthritis   . Asthma   . Depression    Allergies:  Allergies  Allergen Reactions  . Floxin [Ofloxacin] Hives  . Doxycycline Rash     Review of Systems: [X]  = complains of  [ ]  = denies  General: Fatigue Valu.Nieves ] Fever [ ]  Chills [ ]  Weakness [ ]   Insomnia [ ]  Eyes: Redness [ ]  Blurred vision [ ]  Diplopia [ ]   ENT: Congestion [ ]  Sinus Pain [ ]  Post Nasal Drip [ ]  Sore  Throat [ ]  Earache [ ]   Cardiac: Chest pain/pressure [ ]  SOB [ ]  Orthopnea [ ]   Palpitations [ ]   Paroxysmal nocturnal dyspnea[ ]  Claudication [ ]  Edema [ ]   Pulmonary: Cough [ ]  Wheezing[ ]   SOB [ ]   Snoring [ ]   GI: Nausea [ ]  Vomiting[ ]  Dysphagia[ ]  Heartburn[ ]  Abdominal pain [ ]  Constipation [ ] ; Diarrhea [ ] ; BRBPR [ ]  Melena[ ]  GU: Hematuria[ ]  Dysuria [ ]  Nocturia[ ]  Urgency [ ]   Hesitancy [ ]  Discharge [ ]  Neuro: Headaches[X ] Vertigo[ ]  Paresthesias[ ]  Spasm [ ]  Speech changes [ ]  Incoordination [ ]   Ortho: Arthritis [ ]  Joint pain [ ]  Muscle pain [ ]  Joint swelling [ ]  Back Pain [ ]  Skin:  Rash [ ]   Pruritis [ ]  Change in skin lesion [ ]   Psych: Depression[X ] Anxiety[ ]  Confusion [ ]  Memory loss [ ]   Heme/Lypmh: Bleeding [ ]  Bruising [ ]  Enlarged lymph nodes [ ]   Endocrine: Visual blurring [ ]  Paresthesia [ ]  Polyuria [ ]  Polydypsea [ ]    Heat/cold intolerance [ ]  Hypoglycemia [ ]   Family history- Review and unchanged Social history- Review and unchanged Physical Exam: BP 110/78  Pulse 84  Temp(Src) 97.7 F (36.5 C)  Resp 16  Wt 220 lb (99.791 kg) Wt Readings from Last 3 Encounters:  02/11/14 220 lb (99.791 kg)  10/15/13 224 lb (101.606 kg)  12/31/11 224 lb 3.2 oz (101.696 kg)   General Appearance: Well nourished, in no apparent distress. Eyes: PERRLA, EOMs, conjunctiva no swelling or erythema Sinuses: No Frontal/maxillary tenderness ENT/Mouth: Ext aud canals clear, TMs without erythema, bulging. No erythema, swelling, or exudate on post pharynx.  Tonsils not swollen or erythematous. Hearing normal. + TMJ tenderness Neck: Supple, thyroid normal. + SCM tenderness Respiratory: Respiratory effort normal, BS equal bilaterally without rales, rhonchi, wheezing or stridor.  Cardio: RRR with no MRGs. Brisk peripheral pulses without edema.  Abdomen: Soft, obese, + BS.  Non tender, no guarding, rebound, hernias, masses. Lymphatics: Non tender without lymphadenopathy.   Musculoskeletal: Full ROM, 5/5 strength, normal gait.  Skin: Warm, dry without rashes, lesions, ecchymosis.  Neuro: Cranial nerves intact. No cerebellar symptoms. Sensation intact.  Psych: Awake and oriented X 3, normal affect, Insight and Judgment appropriate.   Assessment and Plan:  Hypertension: Continue medication, monitor blood pressure at home. Continue DASH  diet. Cholesterol: Continue diet and exercise. Check cholesterol.  Diabetes-Continue diet and exercise. Check A1C Vitamin D Def- check level and continue medications.  Headaches likely TMJ- information given to the patient, no gum/decrease hard foods, warm wet wash clothes, decrease stress, talk with dentist about possible night guard, can do massage, and exercise. Will send to Dr. Toy Cookey for appliance for OSA and TMJ, flexeril given for at night Depression- cont counseling, call if any changes or needs help Hypothyroid- check level Post menopausal- cont estrogen   Continue diet and meds as discussed. Further disposition pending results of labs. Discussed med's effects and SE's.  OVER 40 minutes of exam, counseling, chart review, referral performed   Vicie Mutters 10:05 AM

## 2014-02-12 LAB — VITAMIN D 25 HYDROXY (VIT D DEFICIENCY, FRACTURES): VIT D 25 HYDROXY: 66 ng/mL (ref 30–89)

## 2014-02-15 ENCOUNTER — Other Ambulatory Visit: Payer: Self-pay | Admitting: Physician Assistant

## 2014-02-16 ENCOUNTER — Ambulatory Visit: Payer: Self-pay | Admitting: Physician Assistant

## 2014-02-17 ENCOUNTER — Encounter: Payer: Self-pay | Admitting: Physician Assistant

## 2014-03-17 ENCOUNTER — Other Ambulatory Visit: Payer: Self-pay | Admitting: Physician Assistant

## 2014-03-30 ENCOUNTER — Other Ambulatory Visit: Payer: Self-pay | Admitting: Physician Assistant

## 2014-03-31 ENCOUNTER — Ambulatory Visit (INDEPENDENT_AMBULATORY_CARE_PROVIDER_SITE_OTHER): Payer: BC Managed Care – PPO | Admitting: Physician Assistant

## 2014-03-31 ENCOUNTER — Encounter: Payer: Self-pay | Admitting: Physician Assistant

## 2014-03-31 VITALS — BP 110/60 | HR 84 | Temp 97.9°F | Resp 16 | Ht 64.0 in | Wt 221.0 lb

## 2014-03-31 DIAGNOSIS — I10 Essential (primary) hypertension: Secondary | ICD-10-CM

## 2014-03-31 DIAGNOSIS — E785 Hyperlipidemia, unspecified: Secondary | ICD-10-CM

## 2014-03-31 DIAGNOSIS — E119 Type 2 diabetes mellitus without complications: Secondary | ICD-10-CM

## 2014-03-31 DIAGNOSIS — E039 Hypothyroidism, unspecified: Secondary | ICD-10-CM

## 2014-03-31 MED ORDER — ESTRADIOL 1 MG PO TABS: ORAL_TABLET | ORAL | Status: DC

## 2014-03-31 MED ORDER — METOPROLOL SUCCINATE ER 25 MG PO TB24: ORAL_TABLET | ORAL | Status: DC

## 2014-03-31 NOTE — Patient Instructions (Signed)
   Bad carbs also include fruit juice, alcohol, and sweet tea. These are empty calories that do not signal to your brain that you are full.   Please remember the good carbs are still carbs which convert into sugar. So please measure them out no more than 1/2-1 cup of rice, oatmeal, pasta, and beans.  Veggies are however free foods! Pile them on.   I like lean protein at every meal such as chicken, [place], pork chops, cottage cheese, etc. Just do not fry these meats and please center your meal around vegetable, the meats should be a side dish.   No all fruit is created equal. Please see the list below, the fruit at the bottom is higher in sugars than the fruit at the top    If you are traveling you can take these medications to be more prepared. If you get chest pain, shortness of breath or abdominal pain please go to the hospital wherever you may be.   Ciprofloxacin is good for travelers diarrhea, you can take 2 pills a day for 7 days. Or it is also good for urinary tract infections, you can take 2 a day for 7 days.  Zpak- is good for sinus infections please finish as prescribed Phenergran is for nausea but it sedating so plan on eating and sleeping.  Levsin is good for nausea, diarrhea, or abdominal cramping- it can constipate you so don't take too much. This dissolves under your tongue.  Prednisone is good for joint pain or rashes or spider bites- you can take it as prescribed but if you are feeling better you can stop it early.

## 2014-03-31 NOTE — Progress Notes (Signed)
HPI 63 y.o.female presents for her thyroid. In addition she has seen the headache clinic and her dentist, she has been diagnosed with migraines and states that she is on Topamax, she continues to have headaches daily.  Her thyroid last time was not in range, her medication was increased to 200 T,T,S,S , 300mg  MWF.She is going on vacation to [place] for 5 weeks and states she will run out of medication before she gets back.  Lab Results  Component Value Date   TSH 9.869* 02/11/2014    Past Medical History  Diagnosis Date  . Sleep difficulties   . Liver disease   . Ventral hernia   . Hyperlipidemia   . Hypertension   . GERD (gastroesophageal reflux disease)   . Type II or unspecified type diabetes mellitus without mention of complication, not stated as uncontrolled   . Thyroid disease     hypothyroidism  . Arthritis   . Asthma   . Depression      Allergies  Allergen Reactions  . Floxin [Ofloxacin] Hives  . Doxycycline Rash      Current Outpatient Prescriptions on File Prior to Visit  Medication Sig Dispense Refill  . ALPRAZolam (XANAX) 0.5 MG tablet TAKE 1 TABLET BY MOUTH TWICE A DAY AS NEEDED  180 tablet  1  . B-D UF III MINI PEN NEEDLES 31G X 5 MM MISC Ad lib.      Marland Kitchen CALCIUM PO Take 1,200 mg by mouth daily.      . Cholecalciferol (VITAMIN D3 PO) Take 5,000 Int'l Units by mouth daily.      . CRESTOR 20 MG tablet Daily.      . cyclobenzaprine (FLEXERIL) 10 MG tablet 1-2 at night for jaw pain  60 tablet  1  . DULoxetine (CYMBALTA) 60 MG capsule TAKE ONE CAPSULE BY MOUTH EVERY DAY  90 capsule  3  . estradiol (ESTRACE) 1 MG tablet TAKE 1/2 TO 1 TABLET BY MOUTH DAILY  30 tablet  3  . Flaxseed, Linseed, (FLAXSEED OIL PO) Take by mouth daily.      . fluconazole (DIFLUCAN) 150 MG tablet TAKE 1 TABLET BY MOUTH  1 tablet  3  . hydrochlorothiazide (HYDRODIURIL) 25 MG tablet Take 25 mg by mouth daily. As Needed      . INVOKAMET [phone] MG TABS TAKE 1 TABLET BY MOUTH 2 TIMES A DAY WITH  FOOD  60 tablet  2  . INVOKAMET [phone] MG TABS TAKE 1 TABLET BY MOUTH 2 TIMES A DAY WITH FOOD  60 tablet  3  . levocetirizine (XYZAL) 5 MG tablet Take 5 mg by mouth as needed.        Marland Kitchen liothyronine (CYTOMEL) 25 MCG tablet 12.5 mg BID times 48H.      . Magnesium 250 MG TABS Take 250 mg by mouth 2 (two) times daily.      . metoprolol succinate (TOPROL-XL) 25 MG 24 hr tablet TAKE 1 TABLET BY MOUTH EVERY DAY  30 tablet  3  . Multiple Vitamins-Minerals (MULTIVITAMIN PO) Take by mouth daily.      Marland Kitchen NEXIUM 40 MG capsule TAKE ONE CAPSULE BY MOUTH EVERY DAY  90 capsule  0  . ONE TOUCH ULTRA TEST test strip Daily.      . promethazine (PHENERGAN) 25 MG tablet 1/2-1 po PRN q 6 hours for nausea  30 tablet  0  . SYNTHROID 200 MCG tablet TAKE 1 TABLET BY MOUTH EVERY DAY  90 tablet  1  .  valsartan (DIOVAN) 320 MG tablet TAKE 1/2 TO 1 TABLET BY MOUTH DAILY FOR BLOOD PRESSURE  90 tablet  1   No current facility-administered medications on file prior to visit.    ROS: all negative expect above.   Physical: Filed Weights   03/31/14 1636  Weight: 221 lb (100.245 kg)   BP 110/60  Pulse 84  Temp(Src) 97.9 F (36.6 C)  Resp 16  Ht 5\' 4"  (1.626 m)  Wt 221 lb (100.245 kg)  BMI 37.92 kg/m2 General Appearance: Well nourished, in no apparent distress. Eyes: PERRLA, EOMs. Sinuses: No Frontal/maxillary tenderness ENT/Mouth: Ext aud canals clear, normal light reflex with TMs without erythema, bulging. Post pharynx without erythema, swelling, exudate.  Respiratory: CTAB Cardio: RRR, no murmurs, rubs or gallops. Peripheral pulses brisk and equal bilaterally, without edema. No aortic or femoral bruits. Abdomen: Soft, with bowl sounds. Nontender, no guarding, rebound. Lymphatics: Non tender without lymphadenopathy.  Musculoskeletal: Full ROM all peripheral extremities, 5/5 strength, and normal gait. Skin: Warm, dry without rashes, lesions, ecchymosis.  Neuro: Cranial nerves intact, reflexes equal bilaterally.  Normal muscle tone, no cerebellar symptoms. Sensation intact.  Pysch: Awake and oriented X 3, normal affect, Insight and Judgment appropriate.   Assessment and Plan: Hypothyroidism-check TSH level, continue medications the same.  Refilled medications so she would not run out on vacation

## 2014-04-01 ENCOUNTER — Encounter: Payer: Self-pay | Admitting: Physician Assistant

## 2014-04-01 LAB — TSH: TSH: 0.075 u[IU]/mL — ABNORMAL LOW (ref 0.350–4.500)

## 2014-04-01 MED ORDER — SYNTHROID 200 MCG PO TABS: ORAL_TABLET | ORAL | Status: DC

## 2014-07-01 ENCOUNTER — Encounter: Payer: Self-pay | Admitting: Physician Assistant

## 2014-07-01 ENCOUNTER — Ambulatory Visit (INDEPENDENT_AMBULATORY_CARE_PROVIDER_SITE_OTHER): Payer: BC Managed Care – PPO | Admitting: Physician Assistant

## 2014-07-01 ENCOUNTER — Encounter: Payer: Self-pay | Admitting: Gastroenterology

## 2014-07-01 VITALS — BP 122/70 | HR 80 | Temp 98.1°F | Resp 16 | Ht 64.0 in | Wt 213.0 lb

## 2014-07-01 DIAGNOSIS — E1129 Type 2 diabetes mellitus with other diabetic kidney complication: Secondary | ICD-10-CM

## 2014-07-01 DIAGNOSIS — Z23 Encounter for immunization: Secondary | ICD-10-CM

## 2014-07-01 DIAGNOSIS — IMO0002 Reserved for concepts with insufficient information to code with codable children: Secondary | ICD-10-CM | POA: Insufficient documentation

## 2014-07-01 DIAGNOSIS — N182 Chronic kidney disease, stage 2 (mild): Secondary | ICD-10-CM

## 2014-07-01 DIAGNOSIS — E669 Obesity, unspecified: Secondary | ICD-10-CM | POA: Insufficient documentation

## 2014-07-01 DIAGNOSIS — Z0000 Encounter for general adult medical examination without abnormal findings: Secondary | ICD-10-CM

## 2014-07-01 DIAGNOSIS — E1122 Type 2 diabetes mellitus with diabetic chronic kidney disease: Secondary | ICD-10-CM | POA: Insufficient documentation

## 2014-07-01 DIAGNOSIS — M26609 Unspecified temporomandibular joint disorder, unspecified side: Secondary | ICD-10-CM

## 2014-07-01 DIAGNOSIS — I10 Essential (primary) hypertension: Secondary | ICD-10-CM

## 2014-07-01 DIAGNOSIS — G4733 Obstructive sleep apnea (adult) (pediatric): Secondary | ICD-10-CM

## 2014-07-01 HISTORY — DX: Chronic kidney disease, stage 2 (mild): N18.2

## 2014-07-01 LAB — URINALYSIS, ROUTINE W REFLEX MICROSCOPIC
Bilirubin Urine: NEGATIVE
Hgb urine dipstick: NEGATIVE
Ketones, ur: NEGATIVE mg/dL
Leukocytes, UA: NEGATIVE
NITRITE: NEGATIVE
PH: 5.5 (ref 5.0–8.0)
Protein, ur: NEGATIVE mg/dL
Specific Gravity, Urine: 1.029 (ref 1.005–1.030)
Urobilinogen, UA: 0.2 mg/dL (ref 0.0–1.0)

## 2014-07-01 LAB — CBC WITH DIFFERENTIAL/PLATELET
BASOS ABS: 0 10*3/uL (ref 0.0–0.1)
Basophils Relative: 0 % (ref 0–1)
Eosinophils Absolute: 0.3 10*3/uL (ref 0.0–0.7)
Eosinophils Relative: 3 % (ref 0–5)
HCT: 41.8 % (ref 36.0–46.0)
Hemoglobin: 13.6 g/dL (ref 12.0–15.0)
LYMPHS PCT: 31 % (ref 12–46)
Lymphs Abs: 3.3 10*3/uL (ref 0.7–4.0)
MCH: 26 pg (ref 26.0–34.0)
MCHC: 32.5 g/dL (ref 30.0–36.0)
MCV: 79.8 fL (ref 78.0–100.0)
Monocytes Absolute: 0.8 10*3/uL (ref 0.1–1.0)
Monocytes Relative: 7 % (ref 3–12)
NEUTROS PCT: 59 % (ref 43–77)
Neutro Abs: 6.4 10*3/uL (ref 1.7–7.7)
PLATELETS: 321 10*3/uL (ref 150–400)
RBC: 5.24 MIL/uL — ABNORMAL HIGH (ref 3.87–5.11)
RDW: 15.9 % — ABNORMAL HIGH (ref 11.5–15.5)
WBC: 10.8 10*3/uL — AB (ref 4.0–10.5)

## 2014-07-01 LAB — HEMOGLOBIN A1C
Hgb A1c MFr Bld: 6.3 % — ABNORMAL HIGH (ref <5.7)
Mean Plasma Glucose: 134 mg/dL — ABNORMAL HIGH (ref <117)

## 2014-07-01 MED ORDER — HYOSCYAMINE SULFATE 0.125 MG SL SUBL: 0.1250 mg | SUBLINGUAL_TABLET | SUBLINGUAL | Status: DC | PRN

## 2014-07-01 MED ORDER — SULFAMETHOXAZOLE-TMP DS 800-160 MG PO TABS: 1.0000 | ORAL_TABLET | Freq: Two times a day (BID) | ORAL | Status: DC

## 2014-07-01 MED ORDER — CLONAZEPAM 0.5 MG PO TABS: 0.5000 mg | ORAL_TABLET | Freq: Two times a day (BID) | ORAL | Status: DC

## 2014-07-01 NOTE — Progress Notes (Signed)
Complete Physical  Assessment and Plan: 1. CKD stage 2 due to DM Increase fluids, decrease NSAIDS, on ARB, control sugars  2. Routine general medical examination at a health care facility - CBC with Differential - BASIC METABOLIC PANEL WITH GFR - Hepatic function panel - Lipid panel - TSH - Hemoglobin A1c - Insulin, fasting - Vit D  25 hydroxy (rtn osteoporosis monitoring) - Urinalysis, Routine w reflex microscopic - Microalbumin / creatinine urine ratio - Vitamin B12 - Magnesium - Iron and TIBC - Ferritin - EKG 12-Lead - HM DIABETES FOOT EXAM  3. Essential hypertension, benign - continue medications, DASH diet, exercise and monitor at home. Call if greater than 130/80.  - [place], RETROPERITNL ABD,  LTD  4. TMJ (temporomandibular joint syndrome) - Ambulatory referral to Sleep Studies  5. OSA (obstructive sleep apnea) Wears mask now but travels often and is interested in getting the dental appliance - Ambulatory referral to Sleep Studies  6. Type II or unspecified type diabetes mellitus with renal manifestations, not stated as uncontrolled Discussed general issues about diabetes pathophysiology and management., Educational material distributed., Suggested low cholesterol diet., Encouraged aerobic exercise., Discussed foot care., Reminded to get yearly retinal exam.  7. Insomnia/RLS Stop xanax, try klonopin 0.5 at night  8. Obesity Obesity with co morbidities- long discussion about weight loss, diet, and exercise   Discussed med's effects and SE's. Screening labs and tests as requested with regular follow-up as recommended.  HPI 63 y.o. female  presents for a complete physical.  Her blood pressure has been controlled at home, today their BP is BP: 122/70 mmHg She does workout. She denies chest pain, shortness of breath, dizziness.  She is on cholesterol medication and denies myalgias. Her cholesterol is at goal. The cholesterol last visit was:   Lab Results  Component  Value Date   CHOL 123 02/11/2014   HDL 44 02/11/2014   LDLCALC 38 02/11/2014   TRIG 205* 02/11/2014   CHOLHDL 2.8 02/11/2014   She has had diabetes for 4 years. She has DM II with CKD stage II from DM.  She has been working on diet and exercise for diabetes, she is on invokamet 150/1000 BID, she is on an ARB, and denies paresthesia of the feet, polydipsia and polyuria. Last A1C in the office was:  Lab Results  Component Value Date   HGBA1C 6.4* 02/11/2014   Patient is on Vitamin D supplement.   Lab Results  Component Value Date   VD25OH 66 02/11/2014     She is on thyroid medication. Her medication was changed last visit, she is on 21mcg everyday and Monday and Thursday 336mcg. Patient denies nervousness, palpitations and weight changes.  Lab Results  Component Value Date   TSH 0.075* 03/31/2014  .  She is on estrace for menopausal symptoms and she is on a bASA with this.  She has had intermittent diarrhea for 8-9 months, not associated with food, she is on Mag, flaxseed oil, and MF. No AB pain, fever, chills, no blood in stool.  She has intermittent headaches frontal lobes, sees Dr. Domingo Cocking, she has history TMJ and would like a referral to Dr. Toy Cookey She is still seeing counselors and going to group hospice meetings from the loss of her son to cancer. She is on cymbalta which is helping.  She has not been sleeping well, she is waking up in the night and her husband states she has restless legs at night, she is on a CPAP  Current Medications:  Current Outpatient Prescriptions on File Prior to Visit  Medication Sig Dispense Refill  . ALPRAZolam (XANAX) 0.5 MG tablet TAKE 1 TABLET BY MOUTH TWICE A DAY AS NEEDED  180 tablet  1  . B-D UF III MINI PEN NEEDLES 31G X 5 MM MISC Ad lib.      Marland Kitchen CALCIUM PO Take 1,200 mg by mouth daily.      . Cholecalciferol (VITAMIN D3 PO) Take 5,000 Int'l Units by mouth daily.      . CRESTOR 20 MG tablet Daily.      . cyclobenzaprine (FLEXERIL) 10 MG tablet 1-2 at  night for jaw pain  60 tablet  1  . DULoxetine (CYMBALTA) 60 MG capsule TAKE ONE CAPSULE BY MOUTH EVERY DAY  90 capsule  3  . estradiol (ESTRACE) 1 MG tablet TAKE 1/2 TO 1 TABLET BY MOUTH DAILY  90 tablet  0  . Flaxseed, Linseed, (FLAXSEED OIL PO) Take 1,300 mg by mouth daily.       . fluconazole (DIFLUCAN) 150 MG tablet TAKE 1 TABLET BY MOUTH  1 tablet  3  . hydrochlorothiazide (HYDRODIURIL) 25 MG tablet Take 25 mg by mouth daily. As Needed      . INVOKAMET [phone] MG TABS TAKE 1 TABLET BY MOUTH 2 TIMES A DAY WITH FOOD  60 tablet  3  . Magnesium 250 MG TABS Take 250 mg by mouth 2 (two) times daily.      . metoprolol succinate (TOPROL-XL) 25 MG 24 hr tablet TAKE 1 TABLET BY MOUTH EVERY DAY  90 tablet  0  . Multiple Vitamins-Minerals (MULTIVITAMIN PO) Take by mouth daily.      Marland Kitchen NEXIUM 40 MG capsule TAKE ONE CAPSULE BY MOUTH EVERY DAY  90 capsule  0  . ONE TOUCH ULTRA TEST test strip Daily.      . promethazine (PHENERGAN) 25 MG tablet 1/2-1 po PRN q 6 hours for nausea  30 tablet  0  . SYNTHROID 200 MCG tablet Take 1 tablet daily except take 1.5 tablets Monday and Friday, or as directed  100 tablet  1  . valsartan (DIOVAN) 320 MG tablet TAKE 1/2 TO 1 TABLET BY MOUTH DAILY FOR BLOOD PRESSURE  90 tablet  1   No current facility-administered medications on file prior to visit.   Health Maintenance:   Immunization History  Administered Date(s) Administered  . Pneumococcal Polysaccharide-23 10/09/2002, 05/26/2008  . Td 10/09/2002, 10/30/2010  . Zoster 06/20/2011   Tetanus: 2012 Pneumovax: 2009 Flu vaccine: Zostavax: 2012 Pap: 2009 remote MGM: 05/2014 DEXA: 2008 osteopenia Colonoscopy: 2004 OVER DUE EGD: Eye: Dr. Dayna Ramus 11/2013 Dentist: Dr. Sarajane Jews q 6 months  Allergies:  Allergies  Allergen Reactions  . Floxin [Ofloxacin] Hives  . Doxycycline Rash   Medical History:  Past Medical History  Diagnosis Date  . Sleep difficulties   . Liver disease   . Ventral hernia   .  Hyperlipidemia   . Hypertension   . GERD (gastroesophageal reflux disease)   . Type II or unspecified type diabetes mellitus without mention of complication, not stated as uncontrolled   . Thyroid disease     hypothyroidism  . Arthritis   . Asthma   . Depression    Surgical History:  Past Surgical History  Procedure Laterality Date  . Total knee arthroplasty      bilateral  . Cholecystectomy    . Abdominal hysterectomy    . Cesarean section    . Tonsillectomy    . Knee  arthroscopy      bilateral  . Carpal tunnel release      bilateral  . Hernia repair  07/24/11    ventral hernia   Family History:  Family History  Problem Relation Age of Onset  . Cancer Mother     breast  . Hypertension Father   . Hyperlipidemia Father   . Cancer Father     prostate  . Hyperlipidemia Sister    Social History:  History  Substance Use Topics  . Smoking status: Never Smoker   . Smokeless tobacco: Not on file  . Alcohol Use: Yes   Review of Systems: [X]  = complains of  [ ]  = denies  General: Fatigue [ ]  Fever [ ]  Chills [ ]  Weakness [ ]   Insomnia [ ] Weight change [ ]  Night sweats [ ]   Change in appetite [ ]  Eyes: Redness [ ]  Blurred vision [ ]  Diplopia [ ]  Discharge [ ]   ENT: Congestion [ ]  Sinus Pain [ ]  Post Nasal Drip [ ]  Sore Throat [ ]  Earache [ ]  hearing loss [ ]  Tinnitus [ ]  Snoring [ ]   Cardiac: Chest pain/pressure [ ]  SOB [ ]  Orthopnea [ ]   Palpitations [ ]   Paroxysmal nocturnal dyspnea[ ]  Claudication [ ]  Edema [ ]   Pulmonary: Cough [ ]  Wheezing[ ]   SOB [ ]   Pleurisy [ ]   GI: Nausea [ ]  Vomiting[ ]  Dysphagia[ ]  Heartburn[ ]  Abdominal pain [ ]  Constipation [ ] ; Diarrhea [ ]  BRBPR [ ]  Melena[ ]  Bloating [ ]  Hemorrhoids [ ]   GU: Hematuria[ ]  Dysuria [ ]  Nocturia[ ]  Urgency [ ]   Hesitancy [ ]  Discharge [ ]  Frequency [ ]   Breast:  Breast lumps [ ]   nipple discharge [ ]    Neuro: Headaches[ ]  Vertigo[ ]  Paresthesias[ ]  Spasm [ ]  Speech changes [ ]  Incoordination [ ]   Ortho:  Arthritis [ ]  Joint pain [ ]  Muscle pain [ ]  Joint swelling [ ]  Back Pain [ ]  Skin:  Rash [ ]   Pruritis [ ]  Change in skin lesion [ ]   Psych: Depression[ ]  Anxiety[ ]  Confusion [ ]  Memory loss [ ]   Heme/Lypmh: Bleeding [ ]  Bruising [ ]  Enlarged lymph nodes [ ]   Endocrine: Visual blurring [ ]  Paresthesia [ ]  Polyuria [ ]  Polydypsea [ ]    Heat/cold intolerance [ ]  Hypoglycemia [ ]   Physical Exam: Estimated body mass index is 36.54 kg/(m^2) as calculated from the following:   Height as of this encounter: 5\' 4"  (1.626 m).   Weight as of this encounter: 213 lb (96.616 kg). BP 122/70  Pulse 80  Temp(Src) 98.1 F (36.7 C)  Resp 16  Ht 5\' 4"  (1.626 m)  Wt 213 lb (96.616 kg)  BMI 36.54 kg/m2 General Appearance: Well nourished, in no apparent distress. Eyes: PERRLA, EOMs, conjunctiva no swelling or erythema, normal fundi and vessels. Sinuses: No Frontal/maxillary tenderness ENT/Mouth: Ext aud canals clear, normal light reflex with TMs without erythema, bulging.  Good dentition. No erythema, swelling, or exudate on post pharynx. Tonsils not swollen or erythematous. Hearing normal.  Neck: Supple, thyroid normal. No bruits Respiratory: Respiratory effort normal, BS equal bilaterally without rales, rhonchi, wheezing or stridor. Cardio: RRR without murmurs, rubs or gallops. Brisk peripheral pulses without edema.  Chest: symmetric, with normal excursions and percussion. Breasts: Symmetric, without lumps, nipple discharge, retractions. Abdomen: Soft, +BS. Non tender, no guarding, rebound, hernias, masses, or organomegaly. .  Lymphatics: Non tender without lymphadenopathy.  Genitourinary: defer Musculoskeletal:  Full ROM all peripheral extremities,5/5 strength, and normal gait. Skin: Warm, dry without rashes, lesions, ecchymosis.  Neuro: Cranial nerves intact, reflexes equal bilaterally. Normal muscle tone, no cerebellar symptoms. Sensation intact.  Psych: Awake and oriented X 3, normal affect,  Insight and Judgment appropriate.   EKG: WNL no changes. AORTA SCAN: WNL    Vicie Mutters 12:48 PM

## 2014-07-01 NOTE — Patient Instructions (Addendum)
Call Dr. Deatra Ina for colonoscopy Try klonopin at night for sleep/RLS rather than xanax If you are traveling you can take these medications to be more prepared. If you get chest pain, shortness of breath or abdominal pain please go to the hospital wherever you may be.   Ciprofloxacin is good for travelers diarrhea, you can take 2 pills a day for 7 days. Or it is also good for urinary tract infections, you can take 2 a day for 7 days.  Zpak- is good for sinus infections please finish as prescribed Phenergran is for nausea but it sedating so plan on eating and sleeping.  Levsin is good for nausea, diarrhea, or abdominal cramping- it can constipate you so don't take too much. This dissolves under your tongue.  Prednisone is good for joint pain or rashes or spider bites- you can take it as prescribed but if you are feeling better you can stop it early.   Preventative Care for Adults - Female      MAINTAIN REGULAR HEALTH EXAMS:  A routine yearly physical is a good way to check in with your primary care provider about your health and preventive screening. It is also an opportunity to share updates about your health and any concerns you have, and receive a thorough all-over exam.   Most health insurance companies pay for at least some preventative services.  Check with your health plan for specific coverages.  WHAT PREVENTATIVE SERVICES DO WOMEN NEED?  Adult women should have their weight and blood pressure checked regularly.   Women age 37 and older should have their cholesterol levels checked regularly.  Women should be screened for cervical cancer with a Pap smear and pelvic exam beginning at either age 89, or 3 years after they become sexually activity.    Breast cancer screening generally begins at age 63 with a mammogram and breast exam by your primary care provider.    Beginning at age 76 and continuing to age 67, women should be screened for colorectal cancer.  Certain people may need  continued testing until age 43.  Updating vaccinations is part of preventative care.  Vaccinations help protect against diseases such as the flu.  Osteoporosis is a disease in which the bones lose minerals and strength as we age. Women ages 109 and over should discuss this with their caregivers, as should women after menopause who have other risk factors.  Lab tests are generally done as part of preventative care to screen for anemia and blood disorders, to screen for problems with the kidneys and liver, to screen for bladder problems, to check blood sugar, and to check your cholesterol level.  Preventative services generally include counseling about diet, exercise, avoiding tobacco, drugs, excessive alcohol consumption, and sexually transmitted infections.    GENERAL RECOMMENDATIONS FOR GOOD HEALTH:  Healthy diet:  Eat a variety of foods, including fruit, vegetables, animal or vegetable protein, such as meat, fish, chicken, and eggs, or beans, lentils, tofu, and grains, such as rice.  Drink plenty of water daily.  Decrease saturated fat in the diet, avoid lots of red meat, processed foods, sweets, fast foods, and fried foods.  Exercise:  Aerobic exercise helps maintain good heart health. At least 30-40 minutes of moderate-intensity exercise is recommended. For example, a brisk walk that increases your heart rate and breathing. This should be done on most days of the week.   Find a type of exercise or a variety of exercises that you enjoy so that it becomes a  part of your daily life.  Examples are running, walking, swimming, water aerobics, and biking.  For motivation and support, explore group exercise such as aerobic class, spin class, Zumba, Yoga,or  martial arts, etc.    Set exercise goals for yourself, such as a certain weight goal, walk or run in a race such as a 5k walk/run.  Speak to your primary care provider about exercise goals.  Disease prevention:  If you smoke or chew  tobacco, find out from your caregiver how to quit. It can literally save your life, no matter how long you have been a tobacco user. If you do not use tobacco, never begin.   Maintain a healthy diet and normal weight. Increased weight leads to problems with blood pressure and diabetes.   The Body Mass Index or BMI is a way of measuring how much of your body is fat. Having a BMI above 27 increases the risk of heart disease, diabetes, hypertension, stroke and other problems related to obesity. Your caregiver can help determine your BMI and based on it develop an exercise and dietary program to help you achieve or maintain this important measurement at a healthful level.  High blood pressure causes heart and blood vessel problems.  Persistent high blood pressure should be treated with medicine if weight loss and exercise do not work.   Fat and cholesterol leaves deposits in your arteries that can block them. This causes heart disease and vessel disease elsewhere in your body.  If your cholesterol is found to be high, or if you have heart disease or certain other medical conditions, then you may need to have your cholesterol monitored frequently and be treated with medication.   Ask if you should have a cardiac stress test if your history suggests this. A stress test is a test done on a treadmill that looks for heart disease. This test can find disease prior to there being a problem.  Menopause can be associated with physical symptoms and risks. Hormone replacement therapy is available to decrease these. You should talk to your caregiver about whether starting or continuing to take hormones is right for you.   Osteoporosis is a disease in which the bones lose minerals and strength as we age. This can result in serious bone fractures. Risk of osteoporosis can be identified using a bone density scan. Women ages 61 and over should discuss this with their caregivers, as should women after menopause who have  other risk factors. Ask your caregiver whether you should be taking a calcium supplement and Vitamin D, to reduce the rate of osteoporosis.   Avoid drinking alcohol in excess (more than two drinks per day).  Avoid use of street drugs. Do not share needles with anyone. Ask for professional help if you need assistance or instructions on stopping the use of alcohol, cigarettes, and/or drugs.  Brush your teeth twice a day with fluoride toothpaste, and floss once a day. Good oral hygiene prevents tooth decay and gum disease. The problems can be painful, unattractive, and can cause other health problems. Visit your dentist for a routine oral and dental check up and preventive care every 6-12 months.   Look at your skin regularly.  Use a mirror to look at your back. Notify your caregivers of changes in moles, especially if there are changes in shapes, colors, a size larger than a pencil eraser, an irregular border, or development of new moles.  Safety:  Use seatbelts 100% of the time, whether driving or  as a passenger.  Use safety devices such as hearing protection if you work in environments with loud noise or significant background noise.  Use safety glasses when doing any work that could send debris in to the eyes.  Use a helmet if you ride a bike or motorcycle.  Use appropriate safety gear for contact sports.  Talk to your caregiver about gun safety.  Use sunscreen with a SPF (or skin protection factor) of 15 or greater.  Lighter skinned people are at a greater risk of skin cancer. Don't forget to also wear sunglasses in order to protect your eyes from too much damaging sunlight. Damaging sunlight can accelerate cataract formation.   Practice safe sex. Use condoms. Condoms are used for birth control and to help reduce the spread of sexually transmitted infections (or STIs).  Some of the STIs are gonorrhea (the clap), chlamydia, syphilis, trichomonas, herpes, HPV (human papilloma virus) and HIV (human  immunodeficiency virus) which causes AIDS. The herpes, HIV and HPV are viral illnesses that have no cure. These can result in disability, cancer and death.   Keep carbon monoxide and smoke detectors in your home functioning at all times. Change the batteries every 6 months or use a model that plugs into the wall.   Vaccinations:  Stay up to date with your tetanus shots and other required immunizations. You should have a booster for tetanus every 10 years. Be sure to get your flu shot every year, since 5%-20% of the [place] population comes down with the flu. The flu vaccine changes each year, so being vaccinated once is not enough. Get your shot in the fall, before the flu season peaks.   Other vaccines to consider:  Human Papilloma Virus or HPV causes cancer of the cervix, and other infections that can be transmitted from person to person. There is a vaccine for HPV, and females should get immunized between the ages of 67 and 2. It requires a series of 3 shots.   Pneumococcal vaccine to protect against certain types of pneumonia.  This is normally recommended for adults age 39 or older.  However, adults younger than 65 years old with certain underlying conditions such as diabetes, heart or lung disease should also receive the vaccine.  Shingles vaccine to protect against Varicella Zoster if you are older than age 56, or younger than 65 years old with certain underlying illness.  Hepatitis A vaccine to protect against a form of infection of the liver by a virus acquired from food.  Hepatitis B vaccine to protect against a form of infection of the liver by a virus acquired from blood or body fluids, particularly if you work in health care.  If you plan to travel internationally, check with your local health department for specific vaccination recommendations.  Cancer Screening:  Breast cancer screening is essential to preventive care for women. All women age 40 and older should perform a breast  self-exam every month. At age 42 and older, women should have their caregiver complete a breast exam each year. Women at ages 60 and older should have a mammogram (x-ray film) of the breasts. Your caregiver can discuss how often you need mammograms.    Cervical cancer screening includes taking a Pap smear (sample of cells examined under a microscope) from the cervix (end of the uterus). It also includes testing for HPV (Human Papilloma Virus, which can cause cervical cancer). Screening and a pelvic exam should begin at age 46, or 3 years after a woman  becomes sexually active. Screening should occur every year, with a Pap smear but no HPV testing, up to age 57. After age 1, you should have a Pap smear every 3 years with HPV testing, if no HPV was found previously.   Most routine colon cancer screening begins at the age of 17. On a yearly basis, doctors may provide special easy to use take-home tests to check for hidden blood in the stool. Sigmoidoscopy or colonoscopy can detect the earliest forms of colon cancer and is life saving. These tests use a small camera at the end of a tube to directly examine the colon. Speak to your caregiver about this at age 74, when routine screening begins (and is repeated every 5 years unless early forms of pre-cancerous polyps or small growths are found).

## 2014-07-02 LAB — URINALYSIS, MICROSCOPIC ONLY
Bacteria, UA: NONE SEEN
Casts: NONE SEEN
Squamous Epithelial / LPF: NONE SEEN

## 2014-07-02 LAB — TSH: TSH: 0.128 u[IU]/mL — ABNORMAL LOW (ref 0.350–4.500)

## 2014-07-02 LAB — BASIC METABOLIC PANEL WITH GFR
BUN: 11 mg/dL (ref 6–23)
CALCIUM: 9.5 mg/dL (ref 8.4–10.5)
CHLORIDE: 101 meq/L (ref 96–112)
CO2: 26 mEq/L (ref 19–32)
Creat: 0.95 mg/dL (ref 0.50–1.10)
GFR, EST NON AFRICAN AMERICAN: 63 mL/min
GFR, Est African American: 73 mL/min
Glucose, Bld: 100 mg/dL — ABNORMAL HIGH (ref 70–99)
Potassium: 4 mEq/L (ref 3.5–5.3)
SODIUM: 136 meq/L (ref 135–145)

## 2014-07-02 LAB — HEPATIC FUNCTION PANEL
ALBUMIN: 4.2 g/dL (ref 3.5–5.2)
ALK PHOS: 110 U/L (ref 39–117)
ALT: 17 U/L (ref 0–35)
AST: 15 U/L (ref 0–37)
BILIRUBIN TOTAL: 0.5 mg/dL (ref 0.2–1.2)
Bilirubin, Direct: 0.1 mg/dL (ref 0.0–0.3)
Indirect Bilirubin: 0.4 mg/dL (ref 0.2–1.2)
Total Protein: 6.7 g/dL (ref 6.0–8.3)

## 2014-07-02 LAB — LIPID PANEL
CHOL/HDL RATIO: 3.1 ratio
CHOLESTEROL: 123 mg/dL (ref 0–200)
HDL: 40 mg/dL (ref >39)
LDL Cholesterol: 34 mg/dL (ref 0–99)
Triglycerides: 247 mg/dL — ABNORMAL HIGH (ref <150)
VLDL: 49 mg/dL — ABNORMAL HIGH (ref 0–40)

## 2014-07-02 LAB — FERRITIN: FERRITIN: 11 ng/mL (ref 10–291)

## 2014-07-02 LAB — IRON AND TIBC
%SAT: 17 % — ABNORMAL LOW (ref 20–55)
Iron: 72 ug/dL (ref 42–145)
TIBC: 435 ug/dL (ref 250–470)
UIBC: 363 ug/dL (ref 125–400)

## 2014-07-02 LAB — MICROALBUMIN / CREATININE URINE RATIO
Creatinine, Urine: 150.2 mg/dL
MICROALB UR: 0.5 mg/dL (ref 0.00–1.89)
Microalb Creat Ratio: 3.3 mg/g (ref 0.0–30.0)

## 2014-07-02 LAB — VITAMIN B12: VITAMIN B 12: 413 pg/mL (ref 211–911)

## 2014-07-02 LAB — VITAMIN D 25 HYDROXY (VIT D DEFICIENCY, FRACTURES): VIT D 25 HYDROXY: 73 ng/mL (ref 30–89)

## 2014-07-02 LAB — MAGNESIUM: Magnesium: 1.9 mg/dL (ref 1.5–2.5)

## 2014-07-02 LAB — INSULIN, FASTING: Insulin fasting, serum: 18.4 u[IU]/mL (ref 2.0–19.6)

## 2014-07-08 ENCOUNTER — Telehealth: Payer: Self-pay | Admitting: *Deleted

## 2014-07-08 NOTE — Telephone Encounter (Signed)
Status on referral to a [facility] DR?

## 2014-07-09 NOTE — Telephone Encounter (Signed)
Referral was sent to Dr.Fuller's office. They are supposed to contact patient directly to schedule appointment. Dr. Corky Sing office is closed today and tomorrow, will call Monday to check status of referral. Patient aware.

## 2014-07-23 ENCOUNTER — Other Ambulatory Visit: Payer: Self-pay | Admitting: Physician Assistant

## 2014-07-31 ENCOUNTER — Encounter: Payer: Self-pay | Admitting: Physician Assistant

## 2014-08-03 ENCOUNTER — Telehealth: Payer: Self-pay

## 2014-08-03 ENCOUNTER — Encounter: Payer: Self-pay | Admitting: Physician Assistant

## 2014-08-03 NOTE — Telephone Encounter (Signed)
Caryl Pina from Dr.Fuller's office called requesting sleep study results. Patient has consultation with Dr.Fuller next week. Faxed records to [facility] [phone]

## 2014-08-05 ENCOUNTER — Other Ambulatory Visit: Payer: Self-pay

## 2014-08-06 ENCOUNTER — Other Ambulatory Visit: Payer: BC Managed Care – PPO

## 2014-08-06 DIAGNOSIS — E038 Other specified hypothyroidism: Secondary | ICD-10-CM

## 2014-08-07 ENCOUNTER — Encounter: Payer: Self-pay | Admitting: Physician Assistant

## 2014-08-07 LAB — TSH: TSH: 0.017 u[IU]/mL — ABNORMAL LOW (ref 0.350–4.500)

## 2014-08-10 ENCOUNTER — Other Ambulatory Visit: Payer: Self-pay | Admitting: Physician Assistant

## 2014-08-10 ENCOUNTER — Other Ambulatory Visit: Payer: Self-pay | Admitting: Emergency Medicine

## 2014-08-24 ENCOUNTER — Encounter: Payer: Self-pay | Admitting: *Deleted

## 2014-09-01 ENCOUNTER — Ambulatory Visit (INDEPENDENT_AMBULATORY_CARE_PROVIDER_SITE_OTHER): Payer: BC Managed Care – PPO | Admitting: Gastroenterology

## 2014-09-01 ENCOUNTER — Encounter: Payer: Self-pay | Admitting: Gastroenterology

## 2014-09-01 VITALS — BP 110/78 | HR 100 | Ht 62.5 in | Wt 208.0 lb

## 2014-09-01 DIAGNOSIS — R197 Diarrhea, unspecified: Secondary | ICD-10-CM

## 2014-09-01 DIAGNOSIS — K769 Liver disease, unspecified: Secondary | ICD-10-CM

## 2014-09-01 DIAGNOSIS — K589 Irritable bowel syndrome without diarrhea: Secondary | ICD-10-CM | POA: Insufficient documentation

## 2014-09-01 DIAGNOSIS — Z1211 Encounter for screening for malignant neoplasm of colon: Secondary | ICD-10-CM

## 2014-09-01 DIAGNOSIS — G4733 Obstructive sleep apnea (adult) (pediatric): Secondary | ICD-10-CM

## 2014-09-01 DIAGNOSIS — K219 Gastro-esophageal reflux disease without esophagitis: Secondary | ICD-10-CM

## 2014-09-01 DIAGNOSIS — E089 Diabetes mellitus due to underlying condition without complications: Secondary | ICD-10-CM

## 2014-09-01 MED ORDER — NA SULFATE-K SULFATE-MG SULF 17.5-3.13-1.6 GM/177ML PO SOLN: 1.0000 | Freq: Once | ORAL | Status: DC

## 2014-09-01 NOTE — Progress Notes (Signed)
_                                                                                                                History of Present Illness:  Grace Barry a pleasant 63 year old white female referred for evaluation of diarrhea and reflux.  She's had reflux symptoms for many years which is generally well controlled with PPI therapy.  She denies dysphagia or abdominal pain.  Over the past year she's developed diarrhea.  This may occur up to 4 days a week.  On those days she could have 3-4 loose stools with urgency.  She denies rectal bleeding.  Last colonoscopy was over 10 years ago.  Approximately 11 months ago her 39 year old son died from esophageal cancer that had metastasized to the stomach and colon.  There are no other family members who have had cancer.   Past Medical History  Diagnosis Date  . Sleep difficulties   . Liver disease   . Ventral hernia   . Hyperlipidemia   . Hypertension   . GERD (gastroesophageal reflux disease)   . Type II or unspecified type diabetes mellitus without mention of complication, not stated as uncontrolled   . Thyroid disease     hypothyroidism  . Arthritis   . Asthma   . Depression   . Anxiety   . Migraines   . Obesity   . Sleep apnea    Past Surgical History  Procedure Laterality Date  . Total knee arthroplasty      bilateral  . Cholecystectomy    . Abdominal hysterectomy    . Cesarean section      x2  . Tonsillectomy    . Knee arthroscopy      bilateral  . Carpal tunnel release      bilateral  . Hernia repair  07/24/11    ventral hernia   family history includes Breast cancer in her mother; Colon cancer in her son; Esophageal cancer (age of onset: 35) in her son; Hyperlipidemia in her father and sister; Hypertension in her father; Prostate cancer in her father; Stomach cancer in her son. There is no history of Colon polyps, Diabetes, or Kidney disease. Current Outpatient Prescriptions  Medication Sig Dispense Refill  .  albuterol (PROAIR HFA) 108 (90 BASE) MCG/ACT inhaler Inhale 1-2 puffs into the lungs every 6 (six) hours as needed for wheezing or shortness of breath.    . ALPRAZolam (XANAX) 0.5 MG tablet TAKE 1 TABLET BY MOUTH TWICE A DAY AS NEEDED 180 tablet 1  . B-D UF III MINI PEN NEEDLES 31G X 5 MM MISC Ad lib.    . baclofen (LIORESAL) 10 MG tablet Take 10 mg by mouth 2 (two) times daily as needed for muscle spasms.    Marland Kitchen CALCIUM PO Take 1,200 mg by mouth daily.    . Cholecalciferol (VITAMIN D3 PO) Take 5,000 Int'l Units by mouth daily.    . CRESTOR 20 MG tablet TAKE 1 TABLET BY MOUTH AT BEDTIME 90 tablet 1  .  cyclobenzaprine (FLEXERIL) 10 MG tablet 1-2 at night for jaw pain (Patient taking differently: 1-2 at night for jaw pain, as needed) 60 tablet 1  . DULoxetine (CYMBALTA) 60 MG capsule TAKE ONE CAPSULE BY MOUTH EVERY DAY 90 capsule 3  . estradiol (ESTRACE) 1 MG tablet TAKE 1/2 TO 1 TABLET BY MOUTH DAILY 90 tablet 0  . Flaxseed, Linseed, (FLAXSEED OIL PO) Take 1,300 mg by mouth daily.     . fluconazole (DIFLUCAN) 150 MG tablet TAKE 1 TABLET BY MOUTH (Patient taking differently: TAKE 1 TABLET BY MOUTH, as needed) 1 tablet 3  . hydrochlorothiazide (HYDRODIURIL) 25 MG tablet Take 25 mg by mouth daily. As Needed    . hyoscyamine (LEVSIN/SL) 0.125 MG SL tablet Place 1 tablet (0.125 mg total) under the tongue every 4 (four) hours as needed. 50 tablet 0  . INVOKAMET 442 [id number] MG TABS TAKE 1 TABLET BY MOUTH 2 TIMES A DAY WITH FOOD 60 tablet 3  . Magnesium 250 MG TABS Take 250 mg by mouth 2 (two) times daily.    . metoprolol succinate (TOPROL-XL) 25 MG 24 hr tablet TAKE 1 TABLET BY MOUTH EVERY DAY 90 tablet 0  . Multiple Vitamins-Minerals (MULTIVITAMIN PO) Take by mouth daily.    Marland Kitchen NEXIUM 40 MG capsule TAKE ONE CAPSULE BY MOUTH EVERY DAY 90 capsule 1  . ONE TOUCH ULTRA TEST test strip Daily.    . promethazine (PHENERGAN) 25 MG tablet 1/2-1 po PRN q 6 hours for nausea (Patient taking differently: 1/2-1 po PRN q 6  hours for nausea, when traveling) 30 tablet 0  . sulfamethoxazole-trimethoprim (BACTRIM DS) 800-160 MG per tablet Take 1 tablet by mouth 2 (two) times daily. 20 tablet 0  . SYNTHROID 200 MCG tablet Take 1 tablet daily except take 1.5 tablets Monday and Friday, or as directed (Patient taking differently: Take 1 tablet daily except take .5 tablets Monday and Friday, or as directed) 100 tablet 1  . topiramate (TOPAMAX) 100 MG tablet Take 100 mg by mouth daily.    . valsartan (DIOVAN) 320 MG tablet TAKE 1/2 TO 1 TABLET BY MOUTH DAILY FOR BLOOD PRESSURE 90 tablet 1   No current facility-administered medications for this visit.   Allergies as of 09/01/2014 - Review Complete 09/01/2014  Allergen Reaction Noted  . Floxin [ofloxacin] Hives 06/12/2011  . Doxycycline Rash 02/11/2014    reports that she has never smoked. She does not have any smokeless tobacco history on file. She reports that she drinks alcohol. She reports that she does not use illicit drugs.   Review of Systems: Pertinent positive and negative review of systems were noted in the above HPI section. All other review of systems were otherwise negative.  Vital signs were reviewed in today's medical record Physical Exam: General: Well developed , well nourished, no acute distress Skin: anicteric Head: Normocephalic and atraumatic Eyes:  sclerae anicteric, EOMI Ears: Normal auditory acuity Mouth: No deformity or lesions Neck: Supple, no masses or thyromegaly Lungs: Clear throughout to auscultation Heart: Regular rate and rhythm; no murmurs, rubs or bruits Abdomen: Soft, non tender and non distended. No masses, hepatosplenomegaly or hernias noted. Normal Bowel sounds Rectal:deferred Musculoskeletal: Symmetrical with no gross deformities  Skin: No lesions on visible extremities Pulses:  Normal pulses noted Extremities: No clubbing, cyanosis, edema or deformities noted Neurological: Alert oriented x 4, grossly nonfocal Cervical  Nodes:  No significant cervical adenopathy Inguinal Nodes: No significant inguinal adenopathy Psychological:  Alert and cooperative. Normal mood and affect  See  Assessment and Plan under Problem List

## 2014-09-01 NOTE — Assessment & Plan Note (Signed)
Patient has long-standing GERD.  She is critically concerned about risk for esophageal cancer in view of her family history.  Barrett's esophagus should be ruled out.  Recommendations #1  Upper endoscopy

## 2014-09-01 NOTE — Patient Instructions (Addendum)
You have been scheduled for an endoscopy. Please follow written instructions given to you at your visit today. If you use inhalers (even only as needed), please bring them with you on the day of your procedure. Your physician has requested that you go to www.[email or web address] and enter the access code given to you at your visit today. This web site gives a general overview about your procedure. However, you should still follow specific instructions given to you by our office regarding your preparation for the procedure.   You have been scheduled for a colonoscopy. Please follow written instructions given to you at your visit today.  Please pick up your prep kit at the pharmacy within the next 1-3 days. If you use inhalers (even only as needed), please bring them with you on the day of your procedure. Your physician has requested that you go to www.[email or web address] and enter the access code given to you at your visit today. This web site gives a general overview about your procedure. However, you should still follow specific instructions given to you by our office regarding your preparation for the procedure.  Hold your diabetic meds the morning of your procedures (Called patient by phone to inform her)

## 2014-09-01 NOTE — Assessment & Plan Note (Signed)
Diarrhea may be multifactorial including stress and anxiety, bacterial overgrowth, structural after maladies including microscopic colitis.  Recommendations #1  Colonoscopy.  If no abnormalities are seen I will obtain random biopsies

## 2014-09-03 ENCOUNTER — Other Ambulatory Visit: Payer: Self-pay

## 2014-09-03 MED ORDER — SYNTHROID 200 MCG PO TABS: ORAL_TABLET | ORAL | Status: DC

## 2014-09-15 ENCOUNTER — Encounter: Payer: Self-pay | Admitting: Gastroenterology

## 2014-10-05 ENCOUNTER — Other Ambulatory Visit: Payer: Self-pay | Admitting: *Deleted

## 2014-10-05 MED ORDER — METOPROLOL SUCCINATE ER 25 MG PO TB24: ORAL_TABLET | ORAL | Status: DC

## 2014-10-20 ENCOUNTER — Ambulatory Visit: Payer: Self-pay | Admitting: Physician Assistant

## 2014-10-21 ENCOUNTER — Ambulatory Visit (INDEPENDENT_AMBULATORY_CARE_PROVIDER_SITE_OTHER): Payer: Medicare Other | Admitting: Physician Assistant

## 2014-10-21 VITALS — BP 110/68 | HR 84 | Temp 97.7°F | Resp 16 | Ht 62.5 in | Wt 206.0 lb

## 2014-10-21 DIAGNOSIS — F329 Major depressive disorder, single episode, unspecified: Secondary | ICD-10-CM

## 2014-10-21 DIAGNOSIS — F32A Depression, unspecified: Secondary | ICD-10-CM

## 2014-10-21 DIAGNOSIS — I10 Essential (primary) hypertension: Secondary | ICD-10-CM

## 2014-10-21 DIAGNOSIS — N182 Chronic kidney disease, stage 2 (mild): Secondary | ICD-10-CM

## 2014-10-21 DIAGNOSIS — E0822 Diabetes mellitus due to underlying condition with diabetic chronic kidney disease: Secondary | ICD-10-CM

## 2014-10-21 DIAGNOSIS — Z0001 Encounter for general adult medical examination with abnormal findings: Secondary | ICD-10-CM

## 2014-10-21 DIAGNOSIS — E669 Obesity, unspecified: Secondary | ICD-10-CM

## 2014-10-21 DIAGNOSIS — R6889 Other general symptoms and signs: Secondary | ICD-10-CM

## 2014-10-21 DIAGNOSIS — E038 Other specified hypothyroidism: Secondary | ICD-10-CM

## 2014-10-21 DIAGNOSIS — Z79899 Other long term (current) drug therapy: Secondary | ICD-10-CM

## 2014-10-21 DIAGNOSIS — E785 Hyperlipidemia, unspecified: Secondary | ICD-10-CM

## 2014-10-21 DIAGNOSIS — Z9181 History of falling: Secondary | ICD-10-CM

## 2014-10-21 MED ORDER — CANAGLIFLOZIN-METFORMIN HCL 150-1000 MG PO TABS: ORAL_TABLET | ORAL | Status: DC

## 2014-10-21 MED ORDER — ESTRADIOL 1 MG PO TABS: ORAL_TABLET | ORAL | Status: DC

## 2014-10-21 NOTE — Progress Notes (Signed)
WELCOME TO MEDICARE VISIT AND FOLLOW UP  Assessment:   Hypertension: Continue medication, can cut diovan in half with weight loss, monitor blood pressure at home. Continue DASH diet.  Reminder to go to the ER if any CP, SOB, nausea, dizziness, severe HA, changes vision/speech, left arm numbness and tingling and jaw pain. Cholesterol: Continue diet and exercise. Check cholesterol.  Diabetes with diabetic chronic kidney disease-Continue diet and exercise. Check A1C Vitamin D Def- check level and continue medications.  Obesity with co morbidities- long discussion about weight loss, diet, and exercise CKD due to DMII- Increase fluids, avoid NSAIDS, will monitor OSA- continue mouth piece GERD- follow up Dr. Deatra Ina, continue Nexium OA- controlled, continue weight loss.  Fatty liver- continue weight loss Hypothyroidism-check TSH level, continue medications the same, reminded to take on an empty stomach 30-39mns before food.    Plan:   During the course of the visit the patient was educated and counseled about appropriate screening and preventive services including:    Pneumococcal vaccine   Influenza vaccine  Td vaccine  Screening electrocardiogram  Screening mammography  Bone densitometry screening  Colorectal cancer screening  Diabetes screening  Glaucoma screening  Nutrition counseling   Advanced directives: given info/requested  Screening recommendations, referrals:  Vaccinations: Please see documentation below and orders this visit.   Nutrition assessed and recommended  Colonoscopy getting in Jan Mammogram up to date Pap smear not indicated Pelvic exam not indicated Recommended yearly ophthalmology/optometry visit for glaucoma screening and checkup Recommended yearly dental visit for hygiene and checkup Advanced directives - requested  Conditions/risks identified: BMI: Discussed weight loss, diet, and increase physical activity.  Increase physical activity:  AHA recommends 150 minutes of physical activity a week.  Medications reviewed DEXA- requested Diabetes is   at goal, ACE/ARB therapy: Yes. Urinary Incontinence is not an issue: discussed non pharmacology and pharmacology options.  Fall risk: low- discussed PT, home fall assessment, medications.    Subjective:   Grace Barry is a 63y.o. female who presents for Welcome to medicare visit and 3 month follow up on hypertension, prediabetes, hyperlipidemia, vitamin D def.  Date of last medicare wellness visit is unknown.    Her blood pressure has been controlled at home, she has been off the HCTZ, she is on diovan 320 and Metoprolol 269mXL for palpitations, today their BP is BP: 110/68 mmHg She does workout. She denies chest pain, shortness of breath, dizziness.  She is on cholesterol medication, crestor 2077maily and denies myalgias. Her cholesterol is at goal. The cholesterol was:  07/01/2014: Cholesterol, Total 123; HDL Cholesterol by NMR 40; LDL (calc) 34; Triglycerides 247* She has been working on diet and exercise for Diabetes with diabetic chronic kidney disease, she is on bASA, she is on ACE/ARB, and denies paresthesia of the feet, polydipsia, polyuria and visual disturbances. Last A1C was: 07/01/2014: Hemoglobin-A1c 6.3* Patient is on Vitamin D supplement. 07/01/2014: Vit D, 25-Hydroxy 73 She is on thyroid medication. Her medication was changed last visit, she is on 200 mcg 1/2 pill M,W,F and 1 pill the rest of the days.  Lab Results  Component Value Date   TSH 0.017* 08/06/2014  She has followed with Dr. FulToy Cookeyd is on a mouth piece that is helping her significantly with sleep, feeling more rested.  Has history of migraines, increased in Dec with stress from passing of son, EllTiffany Kocheryear ago and went to see Dr. FreDomingo Cockingr shots.  BMI is Body mass index is 37.05  kg/(m^2)., she is working on diet and exercise. Wt Readings from Last 3 Encounters:  10/21/14 206 lb (93.441 kg)  09/01/14  208 lb (94.348 kg)  07/01/14 213 lb (96.616 kg)    Names of Other Physician/Practitioners you currently use: 1. [facility]- here for primary care 2. Dr. Dayna Ramus, eye doctor, last visit 11/2013 3. Dr. Sarajane Jews, dentist, last visit q 6 months 4. Dr. Domingo Cocking, neuro 5. Dr. Toy Cookey, sleep apnea 6. Dr. Deatra Ina GI Patient Care Team: Unk Pinto, MD as PCP - General (Internal Medicine)  Medication Review Current Outpatient Prescriptions on File Prior to Visit  Medication Sig Dispense Refill  . albuterol (PROAIR HFA) 108 (90 BASE) MCG/ACT inhaler Inhale 1-2 puffs into the lungs every 6 (six) hours as needed for wheezing or shortness of breath.    . ALPRAZolam (XANAX) 0.5 MG tablet TAKE 1 TABLET BY MOUTH TWICE A DAY AS NEEDED 180 tablet 1  . B-D UF III MINI PEN NEEDLES 31G X 5 MM MISC Ad lib.    . baclofen (LIORESAL) 10 MG tablet Take 10 mg by mouth 2 (two) times daily as needed for muscle spasms.    Marland Kitchen CALCIUM PO Take 1,200 mg by mouth daily.    . Cholecalciferol (VITAMIN D3 PO) Take 5,000 Int'l Units by mouth daily.    . CRESTOR 20 MG tablet TAKE 1 TABLET BY MOUTH AT BEDTIME 90 tablet 1  . cyclobenzaprine (FLEXERIL) 10 MG tablet 1-2 at night for jaw pain (Patient taking differently: 1-2 at night for jaw pain, as needed) 60 tablet 1  . DULoxetine (CYMBALTA) 60 MG capsule TAKE ONE CAPSULE BY MOUTH EVERY DAY 90 capsule 3  . estradiol (ESTRACE) 1 MG tablet TAKE 1/2 TO 1 TABLET BY MOUTH DAILY 90 tablet 0  . Flaxseed, Linseed, (FLAXSEED OIL PO) Take 1,300 mg by mouth daily.     . fluconazole (DIFLUCAN) 150 MG tablet TAKE 1 TABLET BY MOUTH (Patient taking differently: TAKE 1 TABLET BY MOUTH, as needed) 1 tablet 3  . hydrochlorothiazide (HYDRODIURIL) 25 MG tablet Take 25 mg by mouth daily. As Needed    . hyoscyamine (LEVSIN/SL) 0.125 MG SL tablet Place 1 tablet (0.125 mg total) under the tongue every 4 (four) hours as needed. 50 tablet 0  . INVOKAMET [phone] MG  TABS TAKE 1 TABLET BY MOUTH 2 TIMES A DAY WITH FOOD 60 tablet 3  . Magnesium 250 MG TABS Take 250 mg by mouth 2 (two) times daily.    . metoprolol succinate (TOPROL-XL) 25 MG 24 hr tablet TAKE 1 TABLET BY MOUTH EVERY DAY 90 tablet 0  . Multiple Vitamins-Minerals (MULTIVITAMIN PO) Take by mouth daily.    . Na Sulfate-K Sulfate-Mg Sulf (SUPREP BOWEL PREP) SOLN Take 1 kit by mouth once. 1 Bottle 0  . NEXIUM 40 MG capsule TAKE ONE CAPSULE BY MOUTH EVERY DAY 90 capsule 1  . ONE TOUCH ULTRA TEST test strip Daily.    . promethazine (PHENERGAN) 25 MG tablet 1/2-1 po PRN q 6 hours for nausea (Patient taking differently: 1/2-1 po PRN q 6 hours for nausea, when traveling) 30 tablet 0  . sulfamethoxazole-trimethoprim (BACTRIM DS) 800-160 MG per tablet Take 1 tablet by mouth 2 (two) times daily. 20 tablet 0  . SYNTHROID 200 MCG tablet Take 1 tablet daily except on Sunday take a 1/2 tablet 100 tablet 0  . topiramate (TOPAMAX) 100 MG tablet Take 100 mg by mouth daily.    . valsartan (DIOVAN) 320 MG  tablet TAKE 1/2 TO 1 TABLET BY MOUTH DAILY FOR BLOOD PRESSURE 90 tablet 1   No current facility-administered medications on file prior to visit.    Current Problems (verified) Patient Active Problem List   Diagnosis Date Noted  . Screening for colon cancer 09/01/2014  . Diarrhea 09/01/2014  . CKD stage 2  07/01/2014  . Obesity (BMI 30-39.9) 07/01/2014  . Hyperlipidemia   . Liver disease   . Sleep difficulties   . Arthritis   . Asthma   . Ventral hernia 06/12/2011  . Hypothyroidism 12/15/2010  . Diabetes 12/15/2010  . Depression 12/15/2010  . Obstructive sleep apnea 12/15/2010  . Essential hypertension 12/15/2010  . GERD 12/15/2010    Screening Tests Health Maintenance  Topic Date Due  . OPHTHALMOLOGY EXAM  10/23/1959  . COLONOSCOPY  10/23/1999  . INFLUENZA VACCINE  05/30/2014  . HEMOGLOBIN A1C  12/30/2014  . FOOT EXAM  07/02/2015  . URINE MICROALBUMIN  07/02/2015  . MAMMOGRAM  05/21/2016  .  TETANUS/TDAP  10/30/2020  . ZOSTAVAX  Completed     Immunization History  Administered Date(s) Administered  . Pneumococcal Conjugate-13 07/01/2014  . Pneumococcal Polysaccharide-23 10/09/2002, 05/26/2008  . Td 10/09/2002, 10/30/2010  . Zoster 06/20/2011    Preventative care: Tetanus: 2012 Pneumovax: 2009 Flu vaccine: due Zostavax: 2012 Pap: 2009 remote declines another MGM: 05/2014 DEXA: 2008 osteopenia Colonoscopy: 2004 OVER DUE scheduled in Jan DXI:[id number] in Jan Eye: Dr. Dayna Ramus 11/2013 Dentist: Dr. Sarajane Jews q 6 months  History reviewed: allergies, current medications, past family history, past medical history, past social history, past surgical history and problem list    Medication List       This list is accurate as of: 10/21/14 12:31 PM.  Always use your most recent med list.               ALPRAZolam 0.5 MG tablet  Commonly known as:  XANAX  TAKE 1 TABLET BY MOUTH TWICE A DAY AS NEEDED     B-D UF III MINI PEN NEEDLES 31G X 5 MM Misc  Generic drug:  Insulin Pen Needle  Ad lib.     baclofen 10 MG tablet  Commonly known as:  LIORESAL  Take 10 mg by mouth 2 (two) times daily as needed for muscle spasms.     CALCIUM PO  Take 1,200 mg by mouth daily.     CRESTOR 20 MG tablet  Generic drug:  rosuvastatin  TAKE 1 TABLET BY MOUTH AT BEDTIME     cyclobenzaprine 10 MG tablet  Commonly known as:  FLEXERIL  1-2 at night for jaw pain     DULoxetine 60 MG capsule  Commonly known as:  CYMBALTA  TAKE ONE CAPSULE BY MOUTH EVERY DAY     estradiol 1 MG tablet  Commonly known as:  ESTRACE  TAKE 1/2 TO 1 TABLET BY MOUTH DAILY     FLAXSEED OIL PO  Take 1,300 mg by mouth daily.     fluconazole 150 MG tablet  Commonly known as:  DIFLUCAN  TAKE 1 TABLET BY MOUTH     hydrochlorothiazide 25 MG tablet  Commonly known as:  HYDRODIURIL  Take 25 mg by mouth daily. As Needed     hyoscyamine 0.125 MG SL tablet  Commonly known as:  LEVSIN/SL  Place 1 tablet  (0.125 mg total) under the tongue every 4 (four) hours as needed.     INVOKAMET [phone] MG Tabs  Generic drug:  Canagliflozin-Metformin HCl  TAKE 1 TABLET  BY MOUTH 2 TIMES A DAY WITH FOOD     Magnesium 250 MG Tabs  Take 250 mg by mouth 2 (two) times daily.     metoprolol succinate 25 MG 24 hr tablet  Commonly known as:  TOPROL-XL  TAKE 1 TABLET BY MOUTH EVERY DAY     MULTIVITAMIN PO  Take by mouth daily.     Na Sulfate-K Sulfate-Mg Sulf Soln  Commonly known as:  SUPREP BOWEL PREP  Take 1 kit by mouth once.     NEXIUM 40 MG capsule  Generic drug:  esomeprazole  TAKE ONE CAPSULE BY MOUTH EVERY DAY     ONE TOUCH ULTRA TEST test strip  Generic drug:  glucose blood  Daily.     PROAIR HFA 108 (90 BASE) MCG/ACT inhaler  Generic drug:  albuterol  Inhale 1-2 puffs into the lungs every 6 (six) hours as needed for wheezing or shortness of breath.     promethazine 25 MG tablet  Commonly known as:  PHENERGAN  1/2-1 po PRN q 6 hours for nausea     sulfamethoxazole-trimethoprim 800-160 MG per tablet  Commonly known as:  BACTRIM DS  Take 1 tablet by mouth 2 (two) times daily.     SYNTHROID 200 MCG tablet  Generic drug:  levothyroxine  Take 1 tablet daily except on Sunday take a 1/2 tablet     topiramate 100 MG tablet  Commonly known as:  TOPAMAX  Take 100 mg by mouth daily.     valsartan 320 MG tablet  Commonly known as:  DIOVAN  TAKE 1/2 TO 1 TABLET BY MOUTH DAILY FOR BLOOD PRESSURE     VITAMIN D3 PO  Take 5,000 Int'l Units by mouth daily.        Past Surgical History  Procedure Laterality Date  . Total knee arthroplasty      bilateral  . Cholecystectomy    . Abdominal hysterectomy    . Cesarean section      x2  . Tonsillectomy    . Knee arthroscopy      bilateral  . Carpal tunnel release      bilateral  . Hernia repair  07/24/11    ventral hernia   Family History  Problem Relation Age of Onset  . Breast cancer Mother   . Hypertension Father   .  Hyperlipidemia Father   . Prostate cancer Father   . Hyperlipidemia Sister   . Esophageal cancer Son 70    Died 2013/12/01  . Colon cancer Son   . Stomach cancer Son   . Colon polyps Neg Hx   . Diabetes Neg Hx   . Kidney disease Neg Hx    Review of Systems:  Review of Systems  Constitutional: Negative.   HENT: Negative.   Eyes: Negative.   Respiratory: Negative.   Cardiovascular: Negative.   Gastrointestinal: Negative.  Negative for diarrhea (better with decrease in TSH med).  Genitourinary: Negative.   Musculoskeletal: Negative.   Skin: Negative.   Neurological: Negative.   Psychiatric/Behavioral: Negative.     Risk Factors: Osteoporosis/FallRisk: postmenopausal estrogen deficiency and dietary calcium and/or vitamin D deficiency In the past year have you fallen or had a near fall?:No History of fracture in the past year: no  Tobacco History  Substance Use Topics  . Smoking status: Never Smoker   . Smokeless tobacco: Not on file  . Alcohol Use: 0.0 oz/week    0 Not specified per week     Comment: Rarely  She does not smoke.  Patient is not a former smoker. Are there smokers in your home (other than you)?  No  Alcohol Current alcohol use: rare  Caffeine Current caffeine use: coffee 1 /day  Exercise Current exercise: walking  Nutrition/Diet Current diet: in general, a "healthy" diet    Cardiac risk factors: advanced age (older than 75 for men, 71 for women), diabetes mellitus, dyslipidemia, hypertension and obesity (BMI >= 30 kg/m2).  Depression Screen- some reactive depression, 1 year ago lost son, elliot to cancer (Note: if answer to either of the following is "Yes", a more complete depression screening is indicated)   Q1: Over the past two weeks, have you felt down, depressed or hopeless? No  Q2: Over the past two weeks, have you felt little interest or pleasure in doing things? No  Have you lost interest or pleasure in daily life? No  Do you often feel  hopeless? No  Do you cry easily over simple problems? No  Activities of Daily Living In your present state of health, do you have any difficulty performing the following activities?:  Driving? No Managing money?  No Feeding yourself? No Getting from bed to chair? No Climbing a flight of stairs? No Preparing food and eating?: No Bathing or showering? No Getting dressed: No Getting to the toilet? No Using the toilet:No Moving around from place to place: No   Are you sexually active?  Yes  Do you have more than one partner?  No  Vision Difficulties: No  Hearing Difficulties: No Do you often ask people to speak up or repeat themselves? No Do you experience ringing or noises in your ears? No Do you have difficulty understanding soft or whispered voices? No  Cognition  Do you feel that you have a problem with memory?No  Do you often misplace items? No  Do you feel safe at home?  Yes  Advanced directives Does patient have a [place]? Yes Does patient have a Living Will? Yes   Objective:   Blood pressure 110/68, pulse 84, temperature 97.7 F (36.5 C), resp. rate 16, height 5' 2.5" (1.588 m), weight 206 lb (93.441 kg). Body mass index is 37.05 kg/(m^2).  General appearance: alert, no distress, WD/WN,  female Cognitive Testing  Alert? Yes  Normal Appearance?Yes  Oriented to person? Yes  Place? Yes   Time? Yes  Recall of three objects?  Yes  Can perform simple calculations? Yes  Displays appropriate judgment?Yes  Can read the correct time from a watch face?Yes  HEENT: normocephalic, sclerae anicteric, TMs pearly, nares patent, no discharge or erythema, pharynx normal Oral cavity: MMM, no lesions Neck: supple, no lymphadenopathy, no thyromegaly, no masses Heart: RRR, normal S1, S2, no murmurs Lungs: CTA bilaterally, no wheezes, rhonchi, or rales Abdomen: +bs, soft, non tender, non distended, no masses, no hepatomegaly, no  splenomegaly Musculoskeletal: nontender, no swelling, no obvious deformity Extremities: no edema, no cyanosis, no clubbing Pulses: 2+ symmetric, upper and lower extremities, normal cap refill Neurological: alert, oriented x 3, CN2-12 intact, strength normal upper extremities and lower extremities, sensation normal throughout, DTRs 2+ throughout, no cerebellar signs, gait normal Psychiatric: normal affect, behavior normal, pleasant  Breast: defer Gyn: defer Rectal: defer  Medicare Attestation I have personally reviewed: The patient's medical and social history Their use of alcohol, tobacco or illicit drugs Their current medications and supplements The patient's functional ability including ADLs,fall risks, home safety risks, cognitive, and hearing and visual impairment Diet and physical activities  Evidence for depression or mood disorders  The patient's weight, height, BMI, and visual acuity have been recorded in the chart.  I have made referrals, counseling, and provided education to the patient based on review of the above and I have provided the patient with a written personalized care plan for preventive services.     Vicie Mutters, PA-C   10/21/2014

## 2014-10-22 LAB — BASIC METABOLIC PANEL WITH GFR
BUN: 11 mg/dL (ref 6–23)
CALCIUM: 9.4 mg/dL (ref 8.4–10.5)
CO2: 25 mEq/L (ref 19–32)
Chloride: 101 mEq/L (ref 96–112)
Creat: 0.91 mg/dL (ref 0.50–1.10)
GFR, Est African American: 77 mL/min
GFR, Est Non African American: 67 mL/min
GLUCOSE: 98 mg/dL (ref 70–99)
Potassium: 4 mEq/L (ref 3.5–5.3)
Sodium: 136 mEq/L (ref 135–145)

## 2014-10-22 LAB — MAGNESIUM: Magnesium: 1.9 mg/dL (ref 1.5–2.5)

## 2014-10-22 LAB — LIPID PANEL
Cholesterol: 154 mg/dL (ref 0–200)
HDL: 43 mg/dL (ref >39)
LDL CALC: 76 mg/dL (ref 0–99)
TRIGLYCERIDES: 177 mg/dL — AB (ref <150)
Total CHOL/HDL Ratio: 3.6 Ratio
VLDL: 35 mg/dL (ref 0–40)

## 2014-10-22 LAB — HEMOGLOBIN A1C
Hgb A1c MFr Bld: 6.2 % — ABNORMAL HIGH (ref <5.7)
MEAN PLASMA GLUCOSE: 131 mg/dL — AB (ref <117)

## 2014-10-22 LAB — TSH: TSH: 0.5 u[IU]/mL (ref 0.350–4.500)

## 2014-10-30 ENCOUNTER — Encounter: Payer: Self-pay | Admitting: *Deleted

## 2014-11-01 ENCOUNTER — Encounter: Payer: Self-pay | Admitting: Physician Assistant

## 2014-11-06 ENCOUNTER — Other Ambulatory Visit: Payer: Self-pay | Admitting: Gastroenterology

## 2014-11-06 ENCOUNTER — Encounter: Payer: Self-pay | Admitting: Gastroenterology

## 2014-11-06 ENCOUNTER — Ambulatory Visit (AMBULATORY_SURGERY_CENTER): Payer: Medicare Other | Admitting: Gastroenterology

## 2014-11-06 VITALS — BP 100/57 | HR 69 | Temp 97.9°F | Resp 19

## 2014-11-06 DIAGNOSIS — K228 Other specified diseases of esophagus: Secondary | ICD-10-CM

## 2014-11-06 DIAGNOSIS — K317 Polyp of stomach and duodenum: Secondary | ICD-10-CM

## 2014-11-06 DIAGNOSIS — K219 Gastro-esophageal reflux disease without esophagitis: Secondary | ICD-10-CM

## 2014-11-06 DIAGNOSIS — K2990 Gastroduodenitis, unspecified, without bleeding: Secondary | ICD-10-CM

## 2014-11-06 DIAGNOSIS — K2970 Gastritis, unspecified, without bleeding: Secondary | ICD-10-CM

## 2014-11-06 DIAGNOSIS — K319 Disease of stomach and duodenum, unspecified: Secondary | ICD-10-CM

## 2014-11-06 LAB — GLUCOSE, CAPILLARY
GLUCOSE-CAPILLARY: 88 mg/dL (ref 70–99)
Glucose-Capillary: 77 mg/dL (ref 70–99)

## 2014-11-06 MED ORDER — SODIUM CHLORIDE 0.9 % IV SOLN: 500.0000 mL | INTRAVENOUS | Status: DC

## 2014-11-06 NOTE — Op Note (Signed)
[facility]  [address] [place], [address]   ENDOSCOPY PROCEDURE REPORT  PATIENT: Grace Barry, Grace Barry  MR#: [id number] BIRTHDATE: 30-May-1951 , 25  yrs. old GENDER: female ENDOSCOPIST: Inda Castle, MD REFERRED BY:  Unk Pinto, M.D. PROCEDURE DATE:  11/06/2014 PROCEDURE:  EGD w/ biopsy ASA CLASS:     Class II INDICATIONS:  history of esophageal reflux. MEDICATIONS: Monitored anesthesia care and Propofol 120 mg IV TOPICAL ANESTHETIC:  DESCRIPTION OF PROCEDURE: After the risks benefits and alternatives of the procedure were thoroughly explained, informed consent was obtained.  The LB [id number] endoscope was introduced through the mouth and advanced to the second portion of the duodenum , Without limitations.  The instrument was slowly withdrawn as the mucosa was fully examined.      STOMACH: Gastritis (inflammation) was found in the gastric antrum. There was moderate erythema.   Multiple biopsies were performed using cold forceps.   A sessile polyp measuring 3 mm in size was found just beyond the gastroesophageal junction.  Multiple biopsies was performed using cold forceps.  Retroflexed views revealed no abnormalities.     The scope was then withdrawn from the patient and the procedure completed.  COMPLICATIONS: There were no immediate complications.  ENDOSCOPIC IMPRESSION: 1.   Gastritis (inflammation) was found in the gastric antrum; multiple biopsies were performed 2.   Sessile polyp measuring 3 mm in size was found at the gastroesophageal junction; multiple biopsies was performed  RECOMMENDATIONS: Await biopsy results  REPEAT EXAM:  eSigned:  Inda Castle, MD 11/06/2014 9:03 AM    CC:  PATIENT NAME:  [name], [name] MR#: [id number]

## 2014-11-06 NOTE — Progress Notes (Signed)
Called to room to assist during endoscopic procedure.  Patient ID and intended procedure confirmed with present staff. Received instructions for my participation in the procedure from the performing physician.  

## 2014-11-06 NOTE — Patient Instructions (Addendum)

## 2014-11-07 ENCOUNTER — Other Ambulatory Visit: Payer: Self-pay | Admitting: Physician Assistant

## 2014-11-09 ENCOUNTER — Telehealth: Payer: Self-pay

## 2014-11-09 NOTE — Telephone Encounter (Signed)
No answer, left voicemail

## 2014-11-10 ENCOUNTER — Encounter: Payer: Self-pay | Admitting: Gastroenterology

## 2014-11-17 ENCOUNTER — Encounter: Payer: Self-pay | Admitting: Gastroenterology

## 2014-11-17 ENCOUNTER — Ambulatory Visit (AMBULATORY_SURGERY_CENTER): Payer: Medicare Other | Admitting: Gastroenterology

## 2014-11-17 VITALS — BP 101/61 | HR 79 | Temp 97.5°F | Resp 16 | Ht 62.5 in | Wt 208.0 lb

## 2014-11-17 DIAGNOSIS — R197 Diarrhea, unspecified: Secondary | ICD-10-CM

## 2014-11-17 DIAGNOSIS — D123 Benign neoplasm of transverse colon: Secondary | ICD-10-CM

## 2014-11-17 DIAGNOSIS — Z1211 Encounter for screening for malignant neoplasm of colon: Secondary | ICD-10-CM

## 2014-11-17 DIAGNOSIS — D120 Benign neoplasm of cecum: Secondary | ICD-10-CM

## 2014-11-17 DIAGNOSIS — K629 Disease of anus and rectum, unspecified: Secondary | ICD-10-CM

## 2014-11-17 LAB — GLUCOSE, CAPILLARY
Glucose-Capillary: 101 mg/dL — ABNORMAL HIGH (ref 70–99)
Glucose-Capillary: 82 mg/dL (ref 70–99)

## 2014-11-17 MED ORDER — SODIUM CHLORIDE 0.9 % IV SOLN: 500.0000 mL | INTRAVENOUS | Status: DC

## 2014-11-17 NOTE — Patient Instructions (Signed)
YOU HAD AN ENDOSCOPIC PROCEDURE TODAY AT THE [facility]: Refer to the procedure report that was given to you for any specific questions about what was found during the examination.  If the procedure report does not answer your questions, please call your gastroenterologist to clarify.  If you requested that your care partner not be given the details of your procedure findings, then the procedure report has been included in a sealed envelope for you to review at your convenience later.  YOU SHOULD EXPECT: Some feelings of bloating in the abdomen. Passage of more gas than usual.  Walking can help get rid of the air that was put into your GI tract during the procedure and reduce the bloating. If you had a lower endoscopy (such as a colonoscopy or flexible sigmoidoscopy) you may notice spotting of blood in your stool or on the toilet paper. If you underwent a bowel prep for your procedure, then you may not have a normal bowel movement for a few days.  DIET: Your first meal following the procedure should be a light meal and then it is ok to progress to your normal diet.  A half-sandwich or bowl of soup is an example of a good first meal.  Heavy or fried foods are harder to digest and may make you feel nauseous or bloated.  Likewise meals heavy in dairy and vegetables can cause extra gas to form and this can also increase the bloating.  Drink plenty of fluids but you should avoid alcoholic beverages for 24 hours.  ACTIVITY: Your care partner should take you home directly after the procedure.  You should plan to take it easy, moving slowly for the rest of the day.  You can resume normal activity the day after the procedure however you should NOT DRIVE or use heavy machinery for 24 hours (because of the sedation medicines used during the test).    SYMPTOMS TO REPORT IMMEDIATELY: A gastroenterologist can be reached at any hour.  During normal business hours, 8:30 AM to 5:00 PM Monday through Friday,  call [phone].  After hours and on weekends, please call the GI answering service at [phone] who will take a message and have the physician on call contact you.   Following lower endoscopy (colonoscopy or flexible sigmoidoscopy):  Excessive amounts of blood in the stool  Significant tenderness or worsening of abdominal pains  Swelling of the abdomen that is new, acute  Fever of 100F or higher    FOLLOW UP: If any biopsies were taken you will be contacted by phone or by letter within the next 1-3 weeks.  Call your gastroenterologist if you have not heard about the biopsies in 3 weeks.  Our staff will call the home number listed on your records the next business day following your procedure to check on you and address any questions or concerns that you may have at that time regarding the information given to you following your procedure. This is a courtesy call and so if there is no answer at the home number and we have not heard from you through the emergency physician on call, we will assume that you have returned to your regular daily activities without incident.  SIGNATURES/CONFIDENTIALITY: You and/or your care partner have signed paperwork which will be entered into your electronic medical record.  These signatures attest to the fact that that the information above on your After Visit Summary has been reviewed and is understood.  Full responsibility of the confidentiality   of this discharge information lies with you and/or your care-partner.     

## 2014-11-17 NOTE — Progress Notes (Signed)
Report to PACU, RN, vss, BBS= Clear.  

## 2014-11-17 NOTE — Op Note (Signed)
[facility]  [address] [employer] [address] [place], [address]   COLONOSCOPY PROCEDURE REPORT  PATIENT: Grace Barry, Grace Barry  MR#: [id number] BIRTHDATE: 1951-09-17 , 63  yrs. old GENDER: female ENDOSCOPIST: Inda Castle, MD REFERRED OE:UMPNTIR Melford Aase, M.D. PROCEDURE DATE:  11/17/2014 PROCEDURE:   Colonoscopy with snare polypectomy, Colonoscopy with cold biopsy polypectomy, and Colonoscopy with biopsy First Screening Colonoscopy - Avg.  risk and is 50 yrs.  old or older - No.  Prior Negative Screening - Now for repeat screening. Other: See Comments  History of Adenoma - Now for follow-up colonoscopy & has been > or = to 3 yrs.  N/A  Polyps Removed Today? Yes. ASA CLASS:   Class II INDICATIONS:chronic diarrhea. MEDICATIONS: Monitored anesthesia care and Propofol 300 mg IV  DESCRIPTION OF PROCEDURE:   After the risks benefits and alternatives of the procedure were thoroughly explained, informed consent was obtained.  The digital rectal exam revealed no abnormalities of the rectum.   The [id number]  endoscope was introduced through the anus and advanced to the ileum. No adverse events experienced.   The quality of the prep was excellent using Suprep  The instrument was then slowly withdrawn as the colon was fully examined.      COLON FINDINGS: A sessile polyp measuring 4 mm in size was found at the cecum.  A polypectomy was performed with a cold snare.  The resection was complete, the polyp tissue was completely retrieved and sent to histology.   Two sessile polyps measuring 2 mm in size were found in the proximal transverse colon.  A polypectomy was performed with cold forceps.   In the rectal vault mucosa was erythematous and slightly edematous.  multiple biopsies were taken to rule out proctitis.  The remainder of the colon proximal to the rectum was entirely normal.  Random biopsies were taken to rule out microscopic colitis.  Retroflexed views revealed no  abnormalities. The time to cecum=7 minutes 21 seconds.  Withdrawal time=18 minutes 33 seconds.  The scope was withdrawn and the procedure completed. COMPLICATIONS: There were no immediate complications.  ENDOSCOPIC IMPRESSION: 1.   Sessile polyp was found at the cecum; polypectomy was performed with a cold snare 2.   Two sessile polyps were found in the proximal transverse colon; polypectomy was performed with cold forceps 3.   In the rectal vault mucosa was erythematous and slightly edematous.  Multiple biopsies were taken to rule out proctitis. The remainder of the colon proximal to the rectum was entirely normal.  Random biopsies were taken to rule out microscopic colitis   RECOMMENDATIONS: 1.  Await biopsy results 2.  If the polyp(s) removed today are proven to be adenomatous (pre-cancerous) polyps, you will need a repeat colonoscopy in 5 years.  Otherwise you should continue to follow colorectal cancer screening guidelines for "routine risk" patients with colonoscopy in 10 years.  You will receive a letter within 1-2 weeks with the results of your biopsy as well as final recommendations.  Please call my office if you have not received a letter after 3 weeks.  eSigned:  Inda Castle, MD 11/17/2014 2:22 PM   cc:   PATIENT NAME:  Grace Barry, Grace Barry MR#: [id number]

## 2014-11-18 ENCOUNTER — Telehealth: Payer: Self-pay | Admitting: *Deleted

## 2014-11-18 NOTE — Telephone Encounter (Signed)
No answer, left message to call if questions or concerns. 

## 2014-11-26 ENCOUNTER — Telehealth: Payer: Self-pay | Admitting: *Deleted

## 2014-11-26 ENCOUNTER — Encounter: Payer: Self-pay | Admitting: Gastroenterology

## 2014-11-26 MED ORDER — RIFAXIMIN 200 MG PO TABS: 200.0000 mg | ORAL_TABLET | Freq: Three times a day (TID) | ORAL | Status: DC

## 2014-11-26 NOTE — Telephone Encounter (Signed)
I tried to reach this pt re: RX and follow up appt needed. LMOM to call office back

## 2014-11-26 NOTE — Telephone Encounter (Signed)
Spoke with pt.  Xifanax rx sent to [facility] in [place].  F/U appt made for 01-15-15 at 11:00 (Dr. Kelby Fam first available F.U appt)

## 2014-11-27 ENCOUNTER — Other Ambulatory Visit: Payer: Self-pay | Admitting: Physician Assistant

## 2014-12-19 ENCOUNTER — Encounter: Payer: Self-pay | Admitting: *Deleted

## 2015-01-02 ENCOUNTER — Other Ambulatory Visit: Payer: Self-pay | Admitting: Physician Assistant

## 2015-01-06 ENCOUNTER — Other Ambulatory Visit: Payer: Self-pay | Admitting: Internal Medicine

## 2015-01-15 ENCOUNTER — Encounter: Payer: Self-pay | Admitting: Gastroenterology

## 2015-01-15 ENCOUNTER — Ambulatory Visit (INDEPENDENT_AMBULATORY_CARE_PROVIDER_SITE_OTHER): Payer: Medicare Other | Admitting: Gastroenterology

## 2015-01-15 VITALS — BP 134/80 | HR 117 | Ht 62.0 in | Wt 202.8 lb

## 2015-01-15 DIAGNOSIS — R197 Diarrhea, unspecified: Secondary | ICD-10-CM

## 2015-01-15 DIAGNOSIS — Z860100 Personal history of colon polyps, unspecified: Secondary | ICD-10-CM | POA: Insufficient documentation

## 2015-01-15 MED ORDER — ELUXADOLINE 100 MG PO TABS: 1.0000 | ORAL_TABLET | Freq: Two times a day (BID) | ORAL | Status: DC

## 2015-01-15 NOTE — Assessment & Plan Note (Addendum)
The patient is severely symptomatic from a diarrhea.  This is most likely due to IBS.  Xifaxan was ineffective.  Recommendations #1 trial of Viberzi 100 mg twice a day #2 check stool pathogen panel and fecal lactoferrin

## 2015-01-15 NOTE — Progress Notes (Signed)
      History of Present Illness:  Grace Barry has returned following upper and lower endoscopy.  Auditory polyps were seen in the stomach.  Multiple adenomatous polyps per removed from the colon.  Random biopsies were negative for microscopic colitis and there was no pathologic evidence for proctitis.  She was treated empirically with xifaxan without improvement.  About 4 days out of the week she has diarrhea with severe urgency.  There has been no change in medications.    Review of Systems: Pertinent positive and negative review of systems were noted in the above HPI section. All other review of systems were otherwise negative.    Current Medications, Allergies, Past Medical History, Past Surgical History, Family History and Social History were reviewed in [facility] record  Vital signs were reviewed in today's medical record. Physical Exam: General: Well developed , well nourished, no acute distress   See Assessment and Plan under Problem List

## 2015-01-15 NOTE — Assessment & Plan Note (Signed)
Follow-up colonoscopy in 2018

## 2015-01-15 NOTE — Patient Instructions (Signed)
Go to the basement for labs today 

## 2015-01-18 ENCOUNTER — Telehealth: Payer: Self-pay | Admitting: Gastroenterology

## 2015-01-18 DIAGNOSIS — R197 Diarrhea, unspecified: Secondary | ICD-10-CM

## 2015-01-18 MED ORDER — ELUXADOLINE 100 MG PO TABS: 1.0000 | ORAL_TABLET | Freq: Two times a day (BID) | ORAL | Status: DC

## 2015-01-18 NOTE — Telephone Encounter (Signed)
Called pharmacy   Viberzi  Cost 1,[phone]  Pharmacy to contact pt to inform her. I will call her insurance company at [phone]  Mem Number  [id number]

## 2015-01-26 ENCOUNTER — Ambulatory Visit: Payer: Self-pay | Admitting: Physician Assistant

## 2015-01-28 ENCOUNTER — Encounter: Payer: Self-pay | Admitting: Physician Assistant

## 2015-01-28 ENCOUNTER — Ambulatory Visit (INDEPENDENT_AMBULATORY_CARE_PROVIDER_SITE_OTHER): Payer: Medicare Other | Admitting: Physician Assistant

## 2015-01-28 VITALS — BP 122/72 | HR 100 | Temp 97.8°F | Resp 16 | Ht 62.0 in | Wt 208.0 lb

## 2015-01-28 DIAGNOSIS — Z9181 History of falling: Secondary | ICD-10-CM

## 2015-01-28 DIAGNOSIS — K439 Ventral hernia without obstruction or gangrene: Secondary | ICD-10-CM

## 2015-01-28 DIAGNOSIS — I10 Essential (primary) hypertension: Secondary | ICD-10-CM

## 2015-01-28 DIAGNOSIS — N182 Chronic kidney disease, stage 2 (mild): Secondary | ICD-10-CM

## 2015-01-28 DIAGNOSIS — Z860100 Personal history of colon polyps, unspecified: Secondary | ICD-10-CM

## 2015-01-28 DIAGNOSIS — E785 Hyperlipidemia, unspecified: Secondary | ICD-10-CM

## 2015-01-28 DIAGNOSIS — E0822 Diabetes mellitus due to underlying condition with diabetic chronic kidney disease: Secondary | ICD-10-CM

## 2015-01-28 DIAGNOSIS — G4733 Obstructive sleep apnea (adult) (pediatric): Secondary | ICD-10-CM

## 2015-01-28 DIAGNOSIS — K219 Gastro-esophageal reflux disease without esophagitis: Secondary | ICD-10-CM

## 2015-01-28 DIAGNOSIS — F329 Major depressive disorder, single episode, unspecified: Secondary | ICD-10-CM

## 2015-01-28 DIAGNOSIS — E038 Other specified hypothyroidism: Secondary | ICD-10-CM

## 2015-01-28 DIAGNOSIS — J45909 Unspecified asthma, uncomplicated: Secondary | ICD-10-CM

## 2015-01-28 DIAGNOSIS — E669 Obesity, unspecified: Secondary | ICD-10-CM

## 2015-01-28 DIAGNOSIS — F32A Depression, unspecified: Secondary | ICD-10-CM

## 2015-01-28 DIAGNOSIS — Z79899 Other long term (current) drug therapy: Secondary | ICD-10-CM

## 2015-01-28 DIAGNOSIS — K769 Liver disease, unspecified: Secondary | ICD-10-CM

## 2015-01-28 DIAGNOSIS — R197 Diarrhea, unspecified: Secondary | ICD-10-CM

## 2015-01-28 DIAGNOSIS — M1990 Unspecified osteoarthritis, unspecified site: Secondary | ICD-10-CM

## 2015-01-28 DIAGNOSIS — R6889 Other general symptoms and signs: Secondary | ICD-10-CM

## 2015-01-28 DIAGNOSIS — Z0001 Encounter for general adult medical examination with abnormal findings: Secondary | ICD-10-CM

## 2015-01-28 LAB — CBC WITH DIFFERENTIAL/PLATELET
BASOS PCT: 1 % (ref 0–1)
Basophils Absolute: 0.1 10*3/uL (ref 0.0–0.1)
Eosinophils Absolute: 0.2 10*3/uL (ref 0.0–0.7)
Eosinophils Relative: 2 % (ref 0–5)
HCT: 43.9 % (ref 36.0–46.0)
Hemoglobin: 14.6 g/dL (ref 12.0–15.0)
Lymphocytes Relative: 35 % (ref 12–46)
Lymphs Abs: 3.6 10*3/uL (ref 0.7–4.0)
MCH: 28.3 pg (ref 26.0–34.0)
MCHC: 33.3 g/dL (ref 30.0–36.0)
MCV: 85.1 fL (ref 78.0–100.0)
MONO ABS: 0.5 10*3/uL (ref 0.1–1.0)
MPV: 9.9 fL (ref 8.6–12.4)
Monocytes Relative: 5 % (ref 3–12)
NEUTROS ABS: 5.8 10*3/uL (ref 1.7–7.7)
Neutrophils Relative %: 57 % (ref 43–77)
PLATELETS: 317 10*3/uL (ref 150–400)
RBC: 5.16 MIL/uL — ABNORMAL HIGH (ref 3.87–5.11)
RDW: 14.2 % (ref 11.5–15.5)
WBC: 10.2 10*3/uL (ref 4.0–10.5)

## 2015-01-28 LAB — HEMOGLOBIN A1C
HEMOGLOBIN A1C: 6.5 % — AB (ref <5.7)
MEAN PLASMA GLUCOSE: 140 mg/dL — AB (ref <117)

## 2015-01-28 NOTE — Progress Notes (Signed)
MEDICARE ANNUAL WELLNESS VISIT AND FOLLOW UP  Assessment:   1. Essential hypertension - continue medications, DASH diet, exercise and monitor at home. Call if greater than 130/80.  - CBC with Differential/Platelet - Hepatic function panel - TSH  2. Obstructive sleep apnea Sleep apnea- continue mouth piece, weight loss advised.   3. Asthma, unspecified asthma severity, uncomplicated controlled  4. Gastroesophageal reflux disease without esophagitis Continue PPI/H2 blocker, diet discussed  5. Liver disease Check labs, avoid tylenol, alcohol, weight loss advised.   6. Other specified hypothyroidism Hypothyroidism-check TSH level, continue medications the same, reminded to take on an empty stomach 30-41mins before food.   7. 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 - Hemoglobin A1c - Insulin, fasting - HM DIABETES FOOT EXAM  8. CKD stage 2  Increase fluids, avoid NSAIDS, monitor sugars, will monitor - BASIC METABOLIC PANEL WITH GFR  9. Depression Grief reaction  10. Hyperlipidemia -continue medications, check lipids, decrease fatty foods, increase activity.  - Lipid panel  11. Medication management - Magnesium  12. At low risk for fall  13. Encounter for general adult medical examination with abnormal findings  14. Obesity (BMI 30-39.9) Obesity with co morbidities- long discussion about weight loss, diet, and exercise  15. Arthritis RICE, NSAIDS, exercises given, if not better get xray and PT referral or ortho referral.   16. Ventral hernia without obstruction or gangrene No symptoms  17. Diarrhea IBS, controlled  18. History of colonic polyps Due 3 years  Over 30 minutes of exam,  counseling, chart review, and critical decision making was performed  Plan:   During the course of the visit the patient was educated and counseled about appropriate screening and preventive services including:    Pneumococcal vaccine   Influenza vaccine  Td vaccine  Screening electrocardiogram  Screening mammography  Bone densitometry screening  Colorectal cancer screening  Diabetes screening  Glaucoma screening  Nutrition counseling   Advanced directives: given info/requested  Conditions/risks identified: BMI: Discussed weight loss, diet, and increase physical activity.  Increase physical activity: AHA recommends 150 minutes of physical activity a week.  Medications reviewed DEXA- requested Diabetes is   at goal, ACE/ARB therapy: Yes. Urinary Incontinence is not an issue: discussed non pharmacology and pharmacology options.  Fall risk: low- discussed PT, home fall assessment, medications.    Subjective:   Grace Barry is a 64 y.o. female who presents for Welcome to medicare visit and 3 month follow up on hypertension, prediabetes, hyperlipidemia, vitamin D def.  Date of last medicare wellness visit was 09/2014  Her blood pressure has been controlled at home, she has been off the HCTZ, she is on diovan 320 cut in half due to hypotension and Metoprolol 25mg  XL for palpitations, today their BP is BP: 110/68 mmHg She does workout. She denies chest pain, shortness of breath, dizziness.  She is on cholesterol medication, crestor 20mg  daily and denies myalgias. Her cholesterol is at goal. The cholesterol was:   Lab Results  Component Value Date   CHOL 154 10/21/2014   HDL 43 10/21/2014   LDLCALC 76 10/21/2014   TRIG 177* 10/21/2014   CHOLHDL 3.6 10/21/2014   She has been working on diet and exercise for Diabetes with diabetic chronic kidney disease, she is on bASA, she is on ACE/ARB, and denies paresthesia  of the feet, polydipsia, polyuria and visual disturbances.  Last A1C was:  Lab Results  Component Value Date   HGBA1C 6.2* 10/21/2014  Patient is on Vitamin D supplement. Lab Results  Component Value Date   VD25OH 68 07/01/2014  She is on thyroid medication. Her medication was not changed last visit,.  Lab Results  Component Value Date   TSH 0.500 10/21/2014   She has followed with Dr. Toy Cookey and is on a mouth piece that is helping her significantly with sleep, feeling more rested.  Has history of migraines, increased in Dec with stress from passing of son, Tiffany Kocher a year ago and went to see Dr. Domingo Cocking for shots.  BMI is Body mass index is 37.05 kg/(m^2)., she is working on diet and exercise. Wt Readings from Last 3 Encounters:  10/21/14 206 lb (93.441 kg)  09/01/14 208 lb (94.348 kg)  07/01/14 213 lb (96.616 kg)  Sees Dr. Deatra Ina for diarrhea/IBS, worsened by her son's passing.   Medication Review Current Outpatient Prescriptions on File Prior to Visit  Medication Sig Dispense Refill  . albuterol (PROAIR HFA) 108 (90 BASE) MCG/ACT inhaler Inhale 1-2 puffs into the lungs every 6 (six) hours as needed for wheezing or shortness of breath.    . ALPRAZolam (XANAX) 0.5 MG tablet TAKE 1 TABLET BY MOUTH TWICE A DAY AS NEEDED 180 tablet 1  . baclofen (LIORESAL) 10 MG tablet Take 10 mg by mouth 2 (two) times daily as needed for muscle spasms.    Marland Kitchen CALCIUM PO Take 1,200 mg by mouth daily.    . Canagliflozin-Metformin HCl (INVOKAMET) [phone] MG TABS TAKE 1 TABLET BY MOUTH 2 TIMES A DAY WITH FOOD 180 tablet 3  . Cholecalciferol (VITAMIN D3 PO) Take 5,000 Int'l Units by mouth daily.    . CRESTOR 20 MG tablet TAKE 1 TABLET BY MOUTH AT BEDTIME 90 tablet 1  . cyclobenzaprine (FLEXERIL) 10 MG tablet 1-2 at night for jaw pain (Patient taking differently: 1-2 at night for jaw pain, as needed) 60 tablet 1  . DULoxetine (CYMBALTA) 60 MG capsule TAKE ONE CAPSULE BY MOUTH EVERY DAY 90 capsule 3  . estradiol (ESTRACE) 1 MG tablet TAKE 1/2 TO 1 TABLET BY MOUTH DAILY  90 tablet 0  . Flaxseed, Linseed, (FLAXSEED OIL PO) Take 1,300 mg by mouth daily.     . fluconazole (DIFLUCAN) 150 MG tablet TAKE 1 TABLET BY MOUTH (Patient taking differently: TAKE 1 TABLET BY MOUTH, as needed) 1 tablet 3  . Magnesium 250 MG TABS Take 250 mg by mouth 2 (two) times daily.    . metoprolol succinate (TOPROL-XL) 25 MG 24 hr tablet TAKE 1 TABLET BY MOUTH EVERY DAY 90 tablet 0  . Multiple Vitamins-Minerals (MULTIVITAMIN PO) Take by mouth daily.    Marland Kitchen NEXIUM 40 MG capsule TAKE ONE CAPSULE BY MOUTH EVERY DAY 90 capsule 1  . ONE TOUCH ULTRA TEST test strip Daily.    Marland Kitchen SYNTHROID 200 MCG tablet TAKE 1 TABLET DAILY EXCEPT ON SUNDAY TAKE A 1/2 TABLET (Patient taking differently: Take one tablet daily) 100 tablet 0  . topiramate (TOPAMAX) 100 MG tablet Take 100 mg by mouth daily.    . valsartan (DIOVAN) 320 MG tablet TAKE 1/2 TO 1 TABLET BY MOUTH DAILY FOR BLOOD PRESSURE 90 tablet 1   No current facility-administered medications on file prior to visit.    Current Problems (verified) Patient Active Problem List   Diagnosis Date Noted  . History of colonic polyps 01/15/2015  .  Screening for colon cancer 09/01/2014  . Diarrhea 09/01/2014  . CKD stage 2  07/01/2014  . Obesity (BMI 30-39.9) 07/01/2014  . Hyperlipidemia   . Liver disease   . Sleep difficulties   . Arthritis   . Asthma   . Ventral hernia 06/12/2011  . Hypothyroidism 12/15/2010  . Diabetes 12/15/2010  . Depression 12/15/2010  . Obstructive sleep apnea 12/15/2010  . Essential hypertension 12/15/2010  . GERD 12/15/2010    Screening Tests Health Maintenance  Topic Date Due  . OPHTHALMOLOGY EXAM  10/23/1959  . HIV Screening  10/22/1964  . INFLUENZA VACCINE  05/30/2014  . DEXA SCAN  10/22/2014  . HEMOGLOBIN A1C  04/22/2015  . URINE MICROALBUMIN  07/02/2015  . PNA vac Low Risk Adult (2 of 2 - PPSV23) 07/02/2015  . FOOT EXAM  10/22/2015  . MAMMOGRAM  05/21/2016  . COLONOSCOPY  11/17/2017  . TETANUS/TDAP   10/30/2020  . ZOSTAVAX  Completed     Immunization History  Administered Date(s) Administered  . Pneumococcal Conjugate-13 07/01/2014  . Pneumococcal Polysaccharide-23 10/09/2002, 05/26/2008  . Td 10/09/2002, 10/30/2010  . Zoster 06/20/2011    Preventative care: Tetanus: 2012 Pneumovax: 2009 Prevnar 13: 2015 Flu vaccine: due Zostavax: 2012 Pap: 2009 remote declines another MGM: 05/2014 DEXA: 2008 osteopenia Colonoscopy: 10/2014 due 2019 precancerous polyps EGD: 10/2014 + gastritis Eye: Dr. Dayna Ramus 11/2013 Dentist: Dr. Sarajane Jews q 6 months  Names of Other Physician/Practitioners you currently use: 1. [facility]- here for primary care 2. Dr. Dayna Ramus, eye doctor, last visit 11/2013 3. Dr. Sarajane Jews, dentist, last visit q 6 months 4. Dr. Domingo Cocking, neuro 5. Dr. Toy Cookey, sleep apnea 6. Dr. Deatra Ina GI Patient Care Team: Unk Pinto, MD as PCP - General (Internal Medicine)      Medication List       This list is accurate as of: 01/28/15 12:39 PM.  Always use your most recent med list.               ALPRAZolam 0.5 MG tablet  Commonly known as:  XANAX  TAKE 1 TABLET BY MOUTH TWICE A DAY AS NEEDED     baclofen 10 MG tablet  Commonly known as:  LIORESAL  Take 10 mg by mouth 2 (two) times daily as needed for muscle spasms.     CALCIUM PO  Take 1,200 mg by mouth daily.     Canagliflozin-Metformin HCl [phone] MG Tabs  Commonly known as:  INVOKAMET  TAKE 1 TABLET BY MOUTH 2 TIMES A DAY WITH FOOD     CRESTOR 20 MG tablet  Generic drug:  rosuvastatin  TAKE 1 TABLET BY MOUTH AT BEDTIME     cyclobenzaprine 10 MG tablet  Commonly known as:  FLEXERIL  1-2 at night for jaw pain     DULoxetine 60 MG capsule  Commonly known as:  CYMBALTA  TAKE ONE CAPSULE BY MOUTH EVERY DAY     estradiol 1 MG tablet  Commonly known as:  ESTRACE  TAKE 1/2 TO 1 TABLET BY MOUTH DAILY     FLAXSEED OIL PO  Take 1,300 mg by mouth daily.      fluconazole 150 MG tablet  Commonly known as:  DIFLUCAN  TAKE 1 TABLET BY MOUTH     Magnesium 250 MG Tabs  Take 250 mg by mouth 2 (two) times daily.     metoprolol succinate 25 MG 24 hr tablet  Commonly known as:  TOPROL-XL  TAKE 1 TABLET BY MOUTH EVERY DAY  MULTIVITAMIN PO  Take by mouth daily.     NEXIUM 40 MG capsule  Generic drug:  esomeprazole  TAKE ONE CAPSULE BY MOUTH EVERY DAY     ONE TOUCH ULTRA TEST test strip  Generic drug:  glucose blood  Daily.     PROAIR HFA 108 (90 BASE) MCG/ACT inhaler  Generic drug:  albuterol  Inhale 1-2 puffs into the lungs every 6 (six) hours as needed for wheezing or shortness of breath.     SYNTHROID 200 MCG tablet  Generic drug:  levothyroxine  TAKE 1 TABLET DAILY EXCEPT ON SUNDAY TAKE A 1/2 TABLET     topiramate 100 MG tablet  Commonly known as:  TOPAMAX  Take 100 mg by mouth daily.     valsartan 320 MG tablet  Commonly known as:  DIOVAN  TAKE 1/2 TO 1 TABLET BY MOUTH DAILY FOR BLOOD PRESSURE     VITAMIN D3 PO  Take 5,000 Int'l Units by mouth daily.        Past Surgical History  Procedure Laterality Date  . Total knee arthroplasty      bilateral  . Cholecystectomy    . Abdominal hysterectomy    . Cesarean section      x2  . Tonsillectomy    . Knee arthroscopy      bilateral  . Carpal tunnel release      bilateral  . Hernia repair  07/24/11    ventral hernia   Family History  Problem Relation Age of Onset  . Breast cancer Mother   . Hypertension Father   . Hyperlipidemia Father   . Prostate cancer Father   . Hyperlipidemia Sister   . Esophageal cancer Son 81    Died Dec 17, 2013  . Colon cancer Son   . Stomach cancer Son   . Colon polyps Neg Hx   . Diabetes Neg Hx   . Kidney disease Neg Hx    Review of Systems:  Review of Systems  Constitutional: Negative.   HENT: Negative for congestion, ear discharge, ear pain, hearing loss, nosebleeds, sore throat and tinnitus.   Eyes: Negative.   Respiratory:  Negative.  Negative for stridor.   Cardiovascular: Negative.   Gastrointestinal: Positive for diarrhea. Negative for heartburn, nausea, vomiting, abdominal pain, constipation, blood in stool and melena.  Genitourinary: Negative.   Musculoskeletal: Negative.  Negative for falls.  Skin: Negative.   Neurological: Positive for headaches. Negative for dizziness, tingling, tremors, sensory change, speech change, focal weakness, seizures and loss of consciousness.  Endo/Heme/Allergies: Negative.   Psychiatric/Behavioral: Negative.  Negative for depression.    Risk Factors: Osteoporosis/FallRisk: postmenopausal estrogen deficiency and dietary calcium and/or vitamin D deficiency In the past year have you fallen or had a near fall?:No History of fracture in the past year: no  Tobacco History  Substance Use Topics  . Smoking status: Never Smoker   . Smokeless tobacco: Not on file  . Alcohol Use: 0.0 oz/week    0 Not specified per week     Comment: Rarely   She does not smoke.  Patient is not a former smoker. Are there smokers in your home (other than you)?  No  Alcohol Current alcohol use: rare  Caffeine Current caffeine use: coffee 1 /day  Exercise Current exercise: walking  Nutrition/Diet Current diet: in general, a "healthy" diet    Cardiac risk factors: advanced age (older than 87 for men, 35 for women), diabetes mellitus, dyslipidemia, hypertension and obesity (BMI >= 30 kg/m2).  Depression Screen- some reactive depression, 1 year ago lost son, elliot to cancer (Note: if answer to either of the following is "Yes", a more complete depression screening is indicated)   Q1: Over the past two weeks, have you felt down, depressed or hopeless? No  Q2: Over the past two weeks, have you felt little interest or pleasure in doing things? No  Have you lost interest or pleasure in daily life? No  Do you often feel hopeless? No  Do you cry easily over simple problems? No  Activities of  Daily Living In your present state of health, do you have any difficulty performing the following activities?:  Driving? No Managing money?  No Feeding yourself? No Getting from bed to chair? No Climbing a flight of stairs? No Preparing food and eating?: No Bathing or showering? No Getting dressed: No Getting to the toilet? No Using the toilet:No Moving around from place to place: No   Are you sexually active?  Yes  Do you have more than one partner?  No  Vision Difficulties: No  Hearing Difficulties: No Do you often ask people to speak up or repeat themselves? No Do you experience ringing or noises in your ears? No Do you have difficulty understanding soft or whispered voices? No  Cognition  Do you feel that you have a problem with memory?No  Do you often misplace items? No  Do you feel safe at home?  Yes  Advanced directives Does patient have a [place]? Yes Does patient have a Living Will? Yes   Objective:   Blood pressure 110/68, pulse 84, temperature 97.7 F (36.5 C), resp. rate 16, height 5' 2.5" (1.588 m), weight 206 lb (93.441 kg). Body mass index is 37.05 kg/(m^2).  General appearance: alert, no distress, WD/WN,  female Cognitive Testing  Alert? Yes  Normal Appearance?Yes  Oriented to person? Yes  Place? Yes   Time? Yes  Recall of three objects?  Yes  Can perform simple calculations? Yes  Displays appropriate judgment?Yes  Can read the correct time from a watch face?Yes  HEENT: normocephalic, sclerae anicteric, TMs pearly, nares patent, no discharge or erythema, pharynx normal Oral cavity: MMM, no lesions Neck: supple, no lymphadenopathy, no thyromegaly, no masses Heart: RRR, normal S1, S2, no murmurs Lungs: CTA bilaterally, no wheezes, rhonchi, or rales Abdomen: +bs, soft, non tender, non distended, no masses, no hepatomegaly, no splenomegaly Musculoskeletal: nontender, no swelling, no obvious deformity Extremities: no edema, no  cyanosis, no clubbing Pulses: 2+ symmetric, upper and lower extremities, normal cap refill Neurological: alert, oriented x 3, CN2-12 intact, strength normal upper extremities and lower extremities, sensation normal throughout, DTRs 2+ throughout, no cerebellar signs, gait normal Psychiatric: normal affect, behavior normal, pleasant  Breast: defer Gyn: defer Rectal: defer  Medicare Attestation I have personally reviewed: The patient's medical and social history Their use of alcohol, tobacco or illicit drugs Their current medications and supplements The patient's functional ability including ADLs,fall risks, home safety risks, cognitive, and hearing and visual impairment Diet and physical activities Evidence for depression or mood disorders  The patient's weight, height, BMI, and visual acuity have been recorded in the chart.  I have made referrals, counseling, and provided education to the patient based on review of the above and I have provided the patient with a written personalized care plan for preventive services.     Vicie Mutters, PA-C   01/28/2015

## 2015-01-28 NOTE — Patient Instructions (Signed)
Diabetes is a very complicated disease...lets simplify it.  An easy way to look at it to understand the complications is if you think of the extra sugar floating in your blood stream as glass shards floating through your blood stream.    Diabetes affects your small vessels first: 1) The glass shards (sugar) scraps down the tiny blood vessels in your eyes and lead to diabetic retinopathy, the leading cause of blindness in the [place]. Diabetes is the leading cause of newly diagnosed adult (20 to 66 years of age) blindness in the [place].  2) The glass shards scratches down the tiny vessels of your legs leading to nerve damage called neuropathy and can lead to amputations of your feet. More than 60% of all non-traumatic amputations of lower limbs occur in people with diabetes.  3) Over time the small vessels in your brain are shredded and closed off, individually this does not cause any problems but over a long period of time many of the small vessels being blocked can lead to Vascular Dementia.   4) Your kidney's are a filter system and have a "net" that keeps certain things in the body and lets bad things out. Sugar shreds this net and leads to kidney damage and eventually failure. Decreasing the sugar that is destroying the net and certain blood pressure medications can help stop or decrease progression of kidney disease. Diabetes was the primary cause of kidney failure in 44 percent of all new cases in 2011.  5) Diabetes also destroys the small vessels in your penis that lead to erectile dysfunction. Eventually the vessels are so damaged that you may not be responsive to cialis or viagra.   Diabetes and your large vessels: Your larger vessels consist of your coronary arteries in your heart and the carotid vessels to your brain. Diabetes or even increased sugars put you at 300% increased risk of heart attack and stroke and this is why.. The sugar scrapes down your large blood vessels and your body  sees this as an internal injury and tries to repair itself. Just like you get a scab on your skin, your platelets will stick to the blood vessel wall trying to heal it. This is why we have diabetics on low dose aspirin daily, this prevents the platelets from sticking and can prevent plaque formation. In addition, your body takes cholesterol and tries to shove it into the open wound. This is why we want your LDL, or bad cholesterol, below 70.   The combination of platelets and cholesterol over 5-10 years forms plaque that can break off and cause a heart attack or stroke.   PLEASE REMEMBER:  Diabetes is preventable! Up to 85 percent of complications and morbidities among individuals with type 2 diabetes can be prevented, delayed, or effectively treated and minimized with regular visits to a health professional, appropriate monitoring and medication, and a healthy diet and lifestyle.  Recommendations For Diabetic/Prediabetic Patients:   -  Take medications as prescribed  -  Recommend Dr Joel Fuhrman's book "The End of Diabetes "  And "The End of Dieting"- Can get at  www.Amazon.com and encourage also get the Audio CD book  - AVOID Animal products, ie. Meat - red/white, Poultry and Dairy/especially cheese - Exercise at least 5 times a week for 30 minutes or preferably daily.  - No Smoking - Drink less than 2 drinks a day.  - Monitor your feet for sores - Have yearly Eye Exams - Recommend annual Flu vaccine  -   Recommend Pneumovax and Prevnar vaccines - Shingles Vaccine (Zostavax) if over 60 y.o.  Goals:   - BMI less than 24 - Fasting sugar less than 130 or less than 150 if tapering medicines to lose weight  - Systolic BP less than 130  - Diastolic BP less than 80 - Bad LDL Cholesterol less than 70 - Triglycerides less than 150   

## 2015-01-29 LAB — HEPATIC FUNCTION PANEL
ALBUMIN: 4.5 g/dL (ref 3.5–5.2)
ALT: 10 U/L (ref 0–35)
AST: 14 U/L (ref 0–37)
Alkaline Phosphatase: 90 U/L (ref 39–117)
BILIRUBIN TOTAL: 0.5 mg/dL (ref 0.2–1.2)
Bilirubin, Direct: 0.1 mg/dL (ref 0.0–0.3)
Indirect Bilirubin: 0.4 mg/dL (ref 0.2–1.2)
Total Protein: 7.2 g/dL (ref 6.0–8.3)

## 2015-01-29 LAB — BASIC METABOLIC PANEL WITH GFR
BUN: 12 mg/dL (ref 6–23)
CALCIUM: 9.6 mg/dL (ref 8.4–10.5)
CO2: 25 mEq/L (ref 19–32)
Chloride: 101 mEq/L (ref 96–112)
Creat: 1.05 mg/dL (ref 0.50–1.10)
GFR, Est African American: 64 mL/min
GFR, Est Non African American: 56 mL/min — ABNORMAL LOW
GLUCOSE: 116 mg/dL — AB (ref 70–99)
POTASSIUM: 3.8 meq/L (ref 3.5–5.3)
Sodium: 139 mEq/L (ref 135–145)

## 2015-01-29 LAB — LIPID PANEL
Cholesterol: 180 mg/dL (ref 0–200)
HDL: 46 mg/dL (ref >=46)
LDL Cholesterol: 83 mg/dL (ref 0–99)
Total CHOL/HDL Ratio: 3.9 Ratio
Triglycerides: 256 mg/dL — ABNORMAL HIGH (ref <150)
VLDL: 51 mg/dL — ABNORMAL HIGH (ref 0–40)

## 2015-01-29 LAB — MAGNESIUM: Magnesium: 1.9 mg/dL (ref 1.5–2.5)

## 2015-01-29 LAB — TSH: TSH: 9.845 u[IU]/mL — ABNORMAL HIGH (ref 0.350–4.500)

## 2015-01-29 LAB — INSULIN, FASTING: Insulin fasting, serum: 36.6 u[IU]/mL — ABNORMAL HIGH (ref 2.0–19.6)

## 2015-02-02 ENCOUNTER — Encounter: Payer: Self-pay | Admitting: Physician Assistant

## 2015-02-09 ENCOUNTER — Emergency Department (HOSPITAL_COMMUNITY)
Admission: EM | Admit: 2015-02-09 | Discharge: 2015-02-09 | Disposition: A | Discharge status: Home / Self Care | Payer: Medicare Other | Attending: Emergency Medicine | Admitting: Emergency Medicine

## 2015-02-09 ENCOUNTER — Emergency Department (HOSPITAL_COMMUNITY): Payer: Medicare Other

## 2015-02-09 ENCOUNTER — Encounter (HOSPITAL_COMMUNITY): Payer: Self-pay | Admitting: Emergency Medicine

## 2015-02-09 DIAGNOSIS — I10 Essential (primary) hypertension: Secondary | ICD-10-CM | POA: Diagnosis not present

## 2015-02-09 DIAGNOSIS — J45909 Unspecified asthma, uncomplicated: Secondary | ICD-10-CM | POA: Insufficient documentation

## 2015-02-09 DIAGNOSIS — E669 Obesity, unspecified: Secondary | ICD-10-CM | POA: Insufficient documentation

## 2015-02-09 DIAGNOSIS — M1990 Unspecified osteoarthritis, unspecified site: Secondary | ICD-10-CM | POA: Insufficient documentation

## 2015-02-09 DIAGNOSIS — Z8719 Personal history of other diseases of the digestive system: Secondary | ICD-10-CM | POA: Diagnosis not present

## 2015-02-09 DIAGNOSIS — M542 Cervicalgia: Secondary | ICD-10-CM | POA: Insufficient documentation

## 2015-02-09 DIAGNOSIS — E119 Type 2 diabetes mellitus without complications: Secondary | ICD-10-CM | POA: Insufficient documentation

## 2015-02-09 DIAGNOSIS — R42 Dizziness and giddiness: Secondary | ICD-10-CM | POA: Diagnosis not present

## 2015-02-09 DIAGNOSIS — E039 Hypothyroidism, unspecified: Secondary | ICD-10-CM | POA: Insufficient documentation

## 2015-02-09 DIAGNOSIS — G43909 Migraine, unspecified, not intractable, without status migrainosus: Secondary | ICD-10-CM | POA: Insufficient documentation

## 2015-02-09 DIAGNOSIS — Z79899 Other long term (current) drug therapy: Secondary | ICD-10-CM | POA: Diagnosis not present

## 2015-02-09 DIAGNOSIS — Z9889 Other specified postprocedural states: Secondary | ICD-10-CM | POA: Insufficient documentation

## 2015-02-09 DIAGNOSIS — E785 Hyperlipidemia, unspecified: Secondary | ICD-10-CM | POA: Insufficient documentation

## 2015-02-09 LAB — CBC WITH DIFFERENTIAL/PLATELET
BASOS PCT: 1 % (ref 0–1)
Basophils Absolute: 0.1 10*3/uL (ref 0.0–0.1)
Eosinophils Absolute: 0.2 10*3/uL (ref 0.0–0.7)
Eosinophils Relative: 2 % (ref 0–5)
HCT: 43.3 % (ref 36.0–46.0)
HEMOGLOBIN: 14.1 g/dL (ref 12.0–15.0)
LYMPHS PCT: 36 % (ref 12–46)
Lymphs Abs: 3.1 10*3/uL (ref 0.7–4.0)
MCH: 28.3 pg (ref 26.0–34.0)
MCHC: 32.6 g/dL (ref 30.0–36.0)
MCV: 86.9 fL (ref 78.0–100.0)
MONO ABS: 0.5 10*3/uL (ref 0.1–1.0)
Monocytes Relative: 6 % (ref 3–12)
NEUTROS ABS: 4.8 10*3/uL (ref 1.7–7.7)
NEUTROS PCT: 55 % (ref 43–77)
Platelets: 283 10*3/uL (ref 150–400)
RBC: 4.98 MIL/uL (ref 3.87–5.11)
RDW: 14.1 % (ref 11.5–15.5)
WBC: 8.7 10*3/uL (ref 4.0–10.5)

## 2015-02-09 LAB — COMPREHENSIVE METABOLIC PANEL
ALK PHOS: 89 U/L (ref 39–117)
ALT: 17 U/L (ref 0–35)
AST: 19 U/L (ref 0–37)
Albumin: 4.1 g/dL (ref 3.5–5.2)
Anion gap: 11 (ref 5–15)
BILIRUBIN TOTAL: 0.6 mg/dL (ref 0.3–1.2)
BUN: 9 mg/dL (ref 6–23)
CHLORIDE: 104 mmol/L (ref 96–112)
CO2: 25 mmol/L (ref 19–32)
Calcium: 9.7 mg/dL (ref 8.4–10.5)
Creatinine, Ser: 1.01 mg/dL (ref 0.50–1.10)
GFR, EST AFRICAN AMERICAN: 66 mL/min — AB (ref >90)
GFR, EST NON AFRICAN AMERICAN: 57 mL/min — AB (ref >90)
GLUCOSE: 117 mg/dL — AB (ref 70–99)
POTASSIUM: 3.8 mmol/L (ref 3.5–5.1)
Sodium: 140 mmol/L (ref 135–145)
Total Protein: 6.9 g/dL (ref 6.0–8.3)

## 2015-02-09 MED ORDER — MECLIZINE HCL 25 MG PO TABS: 25.0000 mg | ORAL_TABLET | Freq: Three times a day (TID) | ORAL | Status: DC | PRN

## 2015-02-09 MED ORDER — MECLIZINE HCL 25 MG PO TABS
25.0000 mg | ORAL_TABLET | Freq: Once | ORAL | Status: AC
  Administered 2015-02-09: 25 mg via ORAL
  Filled 2015-02-09: qty 1

## 2015-02-09 MED ORDER — IOHEXOL 350 MG/ML SOLN
50.0000 mL | Freq: Once | INTRAVENOUS | Status: AC | PRN
  Administered 2015-02-09: 50 mL via INTRAVENOUS

## 2015-02-09 MED ORDER — SODIUM CHLORIDE 0.9 % IV BOLUS (SEPSIS)
500.0000 mL | INTRAVENOUS | Status: AC
  Administered 2015-02-09: 500 mL via INTRAVENOUS

## 2015-02-09 NOTE — Discharge Instructions (Signed)
Please follow the directions provided. Be sure to follow-up with the neurology referral further management of this dizziness. Take the meclizine 3 times a day as directed. Continue stay well hydrated by drinking plenty of fluids by mouth. Don't hesitate to return for any new, worsening, or concerning symptoms.    SEEK IMMEDIATE MEDICAL CARE IF:  Your dizziness or light-headedness gets worse.  You feel nauseous or vomit.  You have problems talking, walking, or using your arms, hands, or legs.  You feel weak.  You are not thinking clearly or you have trouble forming sentences. It may take a friend or family member to notice this.  You have chest pain, abdominal pain, shortness of breath, or sweating.  Your vision changes.  You notice any bleeding.  You have side effects from medicine that seems to be getting worse rather than better.
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
## 2015-02-19 ENCOUNTER — Encounter: Payer: Self-pay | Admitting: Neurology

## 2015-02-19 ENCOUNTER — Ambulatory Visit (INDEPENDENT_AMBULATORY_CARE_PROVIDER_SITE_OTHER): Payer: Medicare Other | Admitting: Neurology

## 2015-02-19 VITALS — BP 119/80 | HR 96 | Ht 62.0 in | Wt 205.0 lb

## 2015-02-19 DIAGNOSIS — G43009 Migraine without aura, not intractable, without status migrainosus: Secondary | ICD-10-CM | POA: Diagnosis not present

## 2015-02-19 DIAGNOSIS — R42 Dizziness and giddiness: Secondary | ICD-10-CM | POA: Diagnosis not present

## 2015-02-19 DIAGNOSIS — G43909 Migraine, unspecified, not intractable, without status migrainosus: Secondary | ICD-10-CM

## 2015-02-19 HISTORY — DX: Migraine, unspecified, not intractable, without status migrainosus: G43.909

## 2015-02-19 HISTORY — DX: Dizziness and giddiness: R42
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
## 2015-02-19 NOTE — Patient Instructions (Signed)

## 2015-02-22 ENCOUNTER — Other Ambulatory Visit: Payer: Self-pay | Admitting: Physician Assistant

## 2015-02-28 ENCOUNTER — Ambulatory Visit
Admission: RE | Admit: 2015-02-28 | Discharge: 2015-02-28 | Disposition: A | Discharge status: Home / Self Care | Payer: Medicare Other | Source: Ambulatory Visit | Attending: Neurology | Admitting: Neurology

## 2015-02-28 DIAGNOSIS — R42 Dizziness and giddiness: Secondary | ICD-10-CM | POA: Diagnosis not present

## 2015-02-28 DIAGNOSIS — G43009 Migraine without aura, not intractable, without status migrainosus: Secondary | ICD-10-CM

## 2015-03-01 ENCOUNTER — Telehealth: Payer: Self-pay | Admitting: Neurology

## 2015-03-01 NOTE — Telephone Encounter (Signed)
MRI the brain was unremarkable. The report was sent to the patient by Mychart.  MRI brain 02/26/2015:  IMPRESSION:  Normal MRI brain (without).

## 2015-03-24 ENCOUNTER — Other Ambulatory Visit: Payer: Self-pay | Admitting: Internal Medicine

## 2015-04-26 ENCOUNTER — Other Ambulatory Visit: Payer: Self-pay | Admitting: Physician Assistant

## 2015-05-06 ENCOUNTER — Ambulatory Visit: Payer: Self-pay | Admitting: Internal Medicine

## 2015-05-08 ENCOUNTER — Other Ambulatory Visit: Payer: Self-pay | Admitting: Internal Medicine

## 2015-05-12 ENCOUNTER — Other Ambulatory Visit: Payer: Self-pay | Admitting: Physician Assistant

## 2015-05-19 ENCOUNTER — Encounter: Payer: Self-pay | Admitting: Internal Medicine

## 2015-05-19 ENCOUNTER — Ambulatory Visit (INDEPENDENT_AMBULATORY_CARE_PROVIDER_SITE_OTHER): Payer: Medicare Other | Admitting: Internal Medicine

## 2015-05-19 VITALS — BP 110/66 | HR 76 | Temp 97.7°F | Resp 16 | Ht 64.0 in | Wt 210.4 lb

## 2015-05-19 DIAGNOSIS — E559 Vitamin D deficiency, unspecified: Secondary | ICD-10-CM | POA: Insufficient documentation

## 2015-05-19 DIAGNOSIS — E785 Hyperlipidemia, unspecified: Secondary | ICD-10-CM

## 2015-05-19 DIAGNOSIS — E1129 Type 2 diabetes mellitus with other diabetic kidney complication: Secondary | ICD-10-CM

## 2015-05-19 DIAGNOSIS — Z6836 Body mass index (BMI) 36.0-36.9, adult: Secondary | ICD-10-CM

## 2015-05-19 DIAGNOSIS — K219 Gastro-esophageal reflux disease without esophagitis: Secondary | ICD-10-CM

## 2015-05-19 DIAGNOSIS — I10 Essential (primary) hypertension: Secondary | ICD-10-CM

## 2015-05-19 DIAGNOSIS — Z79899 Other long term (current) drug therapy: Secondary | ICD-10-CM

## 2015-05-19 DIAGNOSIS — Z0000 Encounter for general adult medical examination without abnormal findings: Secondary | ICD-10-CM | POA: Insufficient documentation

## 2015-05-19 DIAGNOSIS — G4733 Obstructive sleep apnea (adult) (pediatric): Secondary | ICD-10-CM

## 2015-05-19 DIAGNOSIS — E039 Hypothyroidism, unspecified: Secondary | ICD-10-CM

## 2015-05-19 LAB — CBC WITH DIFFERENTIAL/PLATELET
BASOS ABS: 0 10*3/uL (ref 0.0–0.1)
BASOS PCT: 0 % (ref 0–1)
EOS PCT: 3 % (ref 0–5)
Eosinophils Absolute: 0.3 10*3/uL (ref 0.0–0.7)
HCT: 39.9 % (ref 36.0–46.0)
Hemoglobin: 13.3 g/dL (ref 12.0–15.0)
LYMPHS ABS: 3.7 10*3/uL (ref 0.7–4.0)
LYMPHS PCT: 37 % (ref 12–46)
MCH: 28.2 pg (ref 26.0–34.0)
MCHC: 33.3 g/dL (ref 30.0–36.0)
MCV: 84.7 fL (ref 78.0–100.0)
MONO ABS: 0.6 10*3/uL (ref 0.1–1.0)
MONOS PCT: 6 % (ref 3–12)
MPV: 9.5 fL (ref 8.6–12.4)
Neutro Abs: 5.3 10*3/uL (ref 1.7–7.7)
Neutrophils Relative %: 54 % (ref 43–77)
Platelets: 282 10*3/uL (ref 150–400)
RBC: 4.71 MIL/uL (ref 3.87–5.11)
RDW: 14.8 % (ref 11.5–15.5)
WBC: 9.9 10*3/uL (ref 4.0–10.5)

## 2015-05-19 NOTE — Patient Instructions (Signed)

## 2015-05-19 NOTE — Progress Notes (Signed)
Patient ID: Grace Barry, female   DOB: 30-May-1951, 64 y.o.   MRN: [id number]   This very nice 64 y.o. [facility] presents for quarterly follow up with Hypertension, Hyperlipidemia, Pre-Diabetes and Vitamin D Deficiency. Patient is also on CPAP for OSA and reports improved sense of well-being with improved sleep and less daytime somnolence.    Patient is treated for HTN & BP has been controlled at home. Today's BP: 110/66 mmHg. Patient has had no complaints of any cardiac type chest pain, palpitations, dyspnea/orthopnea/PND, dizziness, claudication, or dependent edema.   Hyperlipidemia is controlled with diet & meds. Patient denies myalgias or other med SE's. Last Lipids were at goal -  Cholesterol 180; HDL 46; LDL 83; with elevated Triglycerides 256 on 01/28/2015.   Also, the patient has history of Morbid Obesity (BMI 36+) and consequent T2_NIDDM and has had no symptoms of reactive hypoglycemia, diabetic polys, paresthesias or visual blurring. Last A1c was 6.5% on 01/28/2015.   Further, the patient also has history of Vitamin D Deficiency and supplements vitamin D without any suspected side-effects. Last vitamin D was 73 on 07/01/2014.  Medication Sig  . albuterol (PROAIR HFA) 108 (90 BASE) MCG/ACT inhaler Inhale 1-2 puffs into the lungs every 6 (six) hours as needed for wheezing or shortness of breath.  . ALPRAZolam (XANAX) 0.5 MG tablet TAKE 1 TABLET BY MOUTH TWICE A DAY AS NEEDED  . aspirin 81 MG tablet Take 81 mg by mouth daily.  . baclofen (LIORESAL) 10 MG tablet Take 10 mg by mouth 2 (two) times daily as needed for muscle spasms.  Marland Kitchen CALCIUM PO Take 1,200 mg by mouth daily.  . Canagliflozin-Metformin HCl (INVOKAMET) [phone] MG TABS TAKE 1 TABLET BY MOUTH 2 TIMES A DAY WITH FOOD  . Cholecalciferol (VITAMIN D3 PO) Take 5,000 Int'l Units by mouth daily.  . CRESTOR 20 MG tablet TAKE 1 TABLET BY MOUTH AT BEDTIME  . cyclobenzaprine (FLEXERIL) 10 MG tablet 1-2 at night for jaw pain (Patient taking  differently: 1-2 at night for jaw pain, as needed)  . DULoxetine (CYMBALTA) 60 MG capsule TAKE ONE CAPSULE BY MOUTH EVERY DAY  . esomeprazole (NEXIUM) 40 MG capsule TAKE ONE CAPSULE BY MOUTH EVERY DAY  . estradiol (ESTRACE) 1 MG tablet TAKE 1/2 TO 1 TABLET BY MOUTH DAILY  . Flaxseed, Linseed, (FLAXSEED OIL PO) Take 1,300 mg by mouth daily.   . fluconazole (DIFLUCAN) 150 MG tablet TAKE 1 TABLET BY MOUTH (Patient taking differently: TAKE 1 TABLET BY MOUTH, as needed)  . Magnesium 250 MG TABS Take 250 mg by mouth 2 (two) times daily.  . metoprolol succinate (TOPROL-XL) 25 MG 24 hr tablet TAKE 1 TABLET BY MOUTH EVERY DAY  . Multiple Vitamins-Minerals (MULTIVITAMIN PO) Take 1 tablet by mouth daily.   . ONE TOUCH ULTRA TEST test strip Daily.  Marland Kitchen PRESCRIPTION MEDICATION Inject as directed every 30 (thirty) days. Allergy Shot  . SYNTHROID 200 MCG tablet TAKE 1 TABLET DAILY EXCEPT ON SUNDAY TAKE A 1/2 TABLET  . topiramate (TOPAMAX) 100 MG tablet Take 100 mg by mouth daily.  . TURMERIC CURCUMIN PO Take 1 capsule by mouth 2 (two) times daily.  . valsartan (DIOVAN) 320 MG tablet TAKE 1/2 TO 1 TABLET BY MOUTH DAILY FOR BLOOD PRESSURE  . esomeprazole (NEXIUM) 40 MG capsule TAKE ONE CAPSULE BY MOUTH EVERY DAY   Allergies  Allergen Reactions  . Floxin [Ofloxacin] Hives  . Doxycycline Rash   PMHx:   Past Medical History  Diagnosis Date  .  Sleep difficulties   . Liver disease   . Ventral hernia   . Hyperlipidemia   . Hypertension   . GERD (gastroesophageal reflux disease)   . Type II or unspecified type diabetes mellitus without mention of complication, not stated as uncontrolled   . Thyroid disease     hypothyroidism  . Arthritis   . Asthma   . Depression   . Anxiety   . Migraines   . Obesity   . Sleep apnea   . Allergy   . Fibromyalgia   . Migraine 02/19/2015  . Vertigo 02/19/2015   Immunization History  Administered Date(s) Administered  . Pneumococcal Conjugate-13 07/01/2014  .  Pneumococcal Polysaccharide-23 10/09/2002, 05/26/2008  . Td 10/09/2002, 10/30/2010  . Zoster 06/20/2011   Past Surgical History  Procedure Laterality Date  . Total knee arthroplasty      bilateral  . Cholecystectomy    . Abdominal hysterectomy    . Cesarean section      x2  . Tonsillectomy    . Knee arthroscopy      bilateral  . Carpal tunnel release      bilateral  . Hernia repair  07/24/11    ventral hernia   FHx:    Reviewed / unchanged  SHx:    Reviewed / unchanged  Systems Review:  Constitutional: Denies fever, chills, wt changes, headaches, insomnia, fatigue, night sweats, change in appetite. Eyes: Denies redness, blurred vision, diplopia, discharge, itchy, watery eyes.  ENT: Denies discharge, congestion, post nasal drip, epistaxis, sore throat, earache, hearing loss, dental pain, tinnitus, vertigo, sinus pain, snoring.  CV: Denies chest pain, palpitations, irregular heartbeat, syncope, dyspnea, diaphoresis, orthopnea, PND, claudication or edema. Respiratory: denies cough, dyspnea, DOE, pleurisy, hoarseness, laryngitis, wheezing.  Gastrointestinal: Denies dysphagia, odynophagia, heartburn, reflux, water brash, abdominal pain or cramps, nausea, vomiting, bloating, diarrhea, constipation, hematemesis, melena, hematochezia  or hemorrhoids. Genitourinary: Denies dysuria, frequency, urgency, nocturia, hesitancy, discharge, hematuria or flank pain. Musculoskeletal: Denies arthralgias, myalgias, stiffness, jt. swelling, pain, limping or strain/sprain.  Skin: Denies pruritus, rash, hives, warts, acne, eczema or change in skin lesion(s). Neuro: No weakness, tremor, incoordination, spasms, paresthesia or pain. Psychiatric: Denies confusion, memory loss or sensory loss. Endo: Denies change in weight, skin or hair change.  Heme/Lymph: No excessive bleeding, bruising or enlarged lymph nodes.  Physical Exam  BP 110/66   Pulse 76  Temp 97.7 F   Resp 16  Ht 5\' 4"    Wt 210 lb 6.4  oz     BMI 36.10   Appears well nourished and in no distress. Eyes: PERRLA, EOMs, conjunctiva no swelling or erythema. Sinuses: No frontal/maxillary tenderness ENT/Mouth: EAC's clear, TM's nl w/o erythema, bulging. Nares clear w/o erythema, swelling, exudates. Oropharynx clear without erythema or exudates. Oral hygiene is good. Tongue normal, non obstructing. Hearing intact.  Neck: Supple. Thyroid nl. Car 2+/2+ without bruits, nodes or JVD. Chest: Respirations nl with BS clear & equal w/o rales, rhonchi, wheezing or stridor.  Cor: Heart sounds normal w/ regular rate and rhythm without sig. murmurs, gallops, clicks, or rubs. Peripheral pulses normal and equal  without edema.  Abdomen: Soft & bowel sounds normal. Non-tender w/o guarding, rebound, hernias, masses, or organomegaly.  Lymphatics: Unremarkable.  Musculoskeletal: Full ROM all peripheral extremities, joint stability, 5/5 strength, and normal gait.  Skin: Warm, dry without exposed rashes, lesions or ecchymosis apparent.  Neuro: Cranial nerves intact, reflexes equal bilaterally. Sensory-motor testing grossly intact. Tendon reflexes grossly intact.  Pysch: Alert & oriented x 3.  Insight and judgement nl & appropriate. No ideations.  Assessment and Plan:  1. Essential hypertension  - TSH  2. Hyperlipidemia  - Lipid panel  3. Type 2 diabetes mellitus with CKD 3   - Hemoglobin A1c - Insulin, random  4. Vitamin D deficiency disease  - Vit D  25 hydroxyl  5. Hypothyroidism  - TSH  6. Gastroesophageal reflux disease   7. Obstructive sleep apnea   8. Medication management  - CBC with Differential/Platelet - BASIC METABOLIC PANEL WITH GFR - Hepatic function panel - Magnesium   Recommended regular exercise, BP monitoring, weight control, and discussed med and SE's. Recommended labs to assess and monitor clinical status. Further disposition pending results of labs. Over 30 minutes of exam, counseling, chart review was  performed

## 2015-05-20 LAB — TSH: TSH: 4.101 u[IU]/mL (ref 0.350–4.500)

## 2015-05-20 LAB — LIPID PANEL
Cholesterol: 130 mg/dL (ref 0–200)
HDL: 45 mg/dL — ABNORMAL LOW
LDL Cholesterol: 51 mg/dL (ref 0–99)
Total CHOL/HDL Ratio: 2.9 ratio
Triglycerides: 171 mg/dL — ABNORMAL HIGH
VLDL: 34 mg/dL (ref 0–40)

## 2015-05-20 LAB — BASIC METABOLIC PANEL WITHOUT GFR
BUN: 13 mg/dL (ref 6–23)
CO2: 23 meq/L (ref 19–32)
Calcium: 9.1 mg/dL (ref 8.4–10.5)
Chloride: 103 meq/L (ref 96–112)
Creat: 1.01 mg/dL (ref 0.50–1.10)
GFR, Est African American: 68 mL/min
GFR, Est Non African American: 59 mL/min — ABNORMAL LOW
Glucose, Bld: 80 mg/dL (ref 70–99)
Potassium: 4.2 meq/L (ref 3.5–5.3)
Sodium: 138 meq/L (ref 135–145)

## 2015-05-20 LAB — HEPATIC FUNCTION PANEL
ALT: 16 U/L (ref 0–35)
AST: 17 U/L (ref 0–37)
Albumin: 4.1 g/dL (ref 3.5–5.2)
Alkaline Phosphatase: 70 U/L (ref 39–117)
Bilirubin, Direct: 0.1 mg/dL (ref 0.0–0.3)
Indirect Bilirubin: 0.2 mg/dL (ref 0.2–1.2)
Total Bilirubin: 0.3 mg/dL (ref 0.2–1.2)
Total Protein: 6.4 g/dL (ref 6.0–8.3)

## 2015-05-20 LAB — VITAMIN D 25 HYDROXY (VIT D DEFICIENCY, FRACTURES): Vit D, 25-Hydroxy: 43 ng/mL (ref 30–100)

## 2015-05-20 LAB — HEMOGLOBIN A1C
Hgb A1c MFr Bld: 6.2 % — ABNORMAL HIGH
Mean Plasma Glucose: 131 mg/dL — ABNORMAL HIGH

## 2015-05-20 LAB — MAGNESIUM: Magnesium: 2 mg/dL (ref 1.5–2.5)

## 2015-05-20 LAB — INSULIN, RANDOM: Insulin: 11.7 u[IU]/mL (ref 2.0–19.6)

## 2015-07-08 ENCOUNTER — Encounter: Payer: Self-pay | Admitting: Physician Assistant

## 2015-07-10 ENCOUNTER — Other Ambulatory Visit: Payer: Self-pay | Admitting: Emergency Medicine

## 2015-07-10 ENCOUNTER — Other Ambulatory Visit: Payer: Self-pay | Admitting: Physician Assistant

## 2015-07-10 ENCOUNTER — Other Ambulatory Visit: Payer: Self-pay | Admitting: Internal Medicine

## 2015-07-10 DIAGNOSIS — H1010 Acute atopic conjunctivitis, unspecified eye: Secondary | ICD-10-CM | POA: Insufficient documentation

## 2015-07-10 DIAGNOSIS — F411 Generalized anxiety disorder: Secondary | ICD-10-CM

## 2015-07-10 DIAGNOSIS — J309 Allergic rhinitis, unspecified: Principal | ICD-10-CM

## 2015-07-14 ENCOUNTER — Telehealth: Payer: Self-pay | Admitting: Gastroenterology

## 2015-07-14 NOTE — Telephone Encounter (Signed)
Patient was seen in March for diarrhea. She was unresponsive to [facility] and she was unable to afford the Viberzi, but the diarrhea stopped. Now it is back. She has had it for about a month now and it is not improving. She will turn in the stool samples and plan OV follow up.

## 2015-07-15 ENCOUNTER — Other Ambulatory Visit: Payer: Self-pay

## 2015-07-15 MED ORDER — ELUXADOLINE 100 MG PO TABS: 1.0000 | ORAL_TABLET | Freq: Two times a day (BID) | ORAL | Status: DC

## 2015-07-15 NOTE — Telephone Encounter (Signed)
I have left message for the patient. A call has been placed to obtain samples or coupon for the medication.

## 2015-07-15 NOTE — Telephone Encounter (Signed)
I think we will be getting 5 Viberzi samples soon and we can give them to her

## 2015-07-15 NOTE — Telephone Encounter (Signed)
Patient notified to come get a coupon for 30 day free of Viberzi.Marland Kitchen At the front desk for pick up.

## 2015-07-19 ENCOUNTER — Other Ambulatory Visit: Payer: Self-pay

## 2015-07-19 ENCOUNTER — Other Ambulatory Visit: Payer: Medicare Other

## 2015-07-19 DIAGNOSIS — R197 Diarrhea, unspecified: Secondary | ICD-10-CM

## 2015-07-20 LAB — GASTROINTESTINAL PATHOGEN PANEL PCR
C. difficile Tox A/B, PCR: NEGATIVE
CRYPTOSPORIDIUM, PCR: NEGATIVE
Campylobacter, PCR: NEGATIVE
E COLI (ETEC) LT/ST, PCR: NEGATIVE
E COLI 0157, PCR: NEGATIVE
E coli (STEC) stx1/stx2, PCR: NEGATIVE
Giardia lamblia, PCR: NEGATIVE
Norovirus, PCR: NEGATIVE
Rotavirus A, PCR: NEGATIVE
SHIGELLA, PCR: NEGATIVE
Salmonella, PCR: NEGATIVE

## 2015-07-20 LAB — FECAL LACTOFERRIN, QUANT: Lactoferrin: POSITIVE

## 2015-07-22 ENCOUNTER — Telehealth: Payer: Self-pay | Admitting: Gastroenterology

## 2015-07-22 NOTE — Telephone Encounter (Signed)
Advised 

## 2015-07-22 NOTE — Telephone Encounter (Signed)
List by Jason Fila as an allergy.  Patient should call back if she develops recurrent diarrhea which I think she probably will at which point would consider Lotronex

## 2015-07-22 NOTE — Telephone Encounter (Signed)
She took a dose yesterday and broke out in hives last night. Intense itching. She has not taken Viberzi since then. She is dosing with Benadryl. No respiratory symptoms.  So she knows to stop Viberzi. She has not had diarrhea in 2 days. She is not on pro-biotics. Do you want her to do anything at this point?

## 2015-08-06 ENCOUNTER — Ambulatory Visit (INDEPENDENT_AMBULATORY_CARE_PROVIDER_SITE_OTHER): Payer: Medicare Other | Admitting: Neurology

## 2015-08-06 DIAGNOSIS — J309 Allergic rhinitis, unspecified: Secondary | ICD-10-CM | POA: Diagnosis not present

## 2015-08-10 ENCOUNTER — Ambulatory Visit (INDEPENDENT_AMBULATORY_CARE_PROVIDER_SITE_OTHER): Payer: Medicare Other | Admitting: Physician Assistant

## 2015-08-10 ENCOUNTER — Encounter: Payer: Self-pay | Admitting: Physician Assistant

## 2015-08-10 VITALS — BP 124/60 | HR 75 | Temp 97.3°F | Resp 14 | Ht 64.0 in | Wt 205.0 lb

## 2015-08-10 DIAGNOSIS — E1129 Type 2 diabetes mellitus with other diabetic kidney complication: Secondary | ICD-10-CM

## 2015-08-10 DIAGNOSIS — J45909 Unspecified asthma, uncomplicated: Secondary | ICD-10-CM

## 2015-08-10 DIAGNOSIS — I10 Essential (primary) hypertension: Secondary | ICD-10-CM

## 2015-08-10 DIAGNOSIS — F329 Major depressive disorder, single episode, unspecified: Secondary | ICD-10-CM

## 2015-08-10 DIAGNOSIS — E039 Hypothyroidism, unspecified: Secondary | ICD-10-CM

## 2015-08-10 DIAGNOSIS — N182 Chronic kidney disease, stage 2 (mild): Secondary | ICD-10-CM

## 2015-08-10 DIAGNOSIS — N6320 Unspecified lump in the left breast, unspecified quadrant: Secondary | ICD-10-CM

## 2015-08-10 DIAGNOSIS — Z0000 Encounter for general adult medical examination without abnormal findings: Secondary | ICD-10-CM

## 2015-08-10 DIAGNOSIS — R197 Diarrhea, unspecified: Secondary | ICD-10-CM

## 2015-08-10 DIAGNOSIS — Z23 Encounter for immunization: Secondary | ICD-10-CM | POA: Diagnosis not present

## 2015-08-10 DIAGNOSIS — M8580 Other specified disorders of bone density and structure, unspecified site: Secondary | ICD-10-CM

## 2015-08-10 DIAGNOSIS — E669 Obesity, unspecified: Secondary | ICD-10-CM

## 2015-08-10 DIAGNOSIS — Z79899 Other long term (current) drug therapy: Secondary | ICD-10-CM

## 2015-08-10 DIAGNOSIS — E559 Vitamin D deficiency, unspecified: Secondary | ICD-10-CM

## 2015-08-10 DIAGNOSIS — G4733 Obstructive sleep apnea (adult) (pediatric): Secondary | ICD-10-CM

## 2015-08-10 DIAGNOSIS — F32A Depression, unspecified: Secondary | ICD-10-CM

## 2015-08-10 DIAGNOSIS — Z86010 Personal history of colonic polyps: Secondary | ICD-10-CM

## 2015-08-10 DIAGNOSIS — E785 Hyperlipidemia, unspecified: Secondary | ICD-10-CM

## 2015-08-10 DIAGNOSIS — D492 Neoplasm of unspecified behavior of bone, soft tissue, and skin: Secondary | ICD-10-CM

## 2015-08-10 LAB — CBC WITH DIFFERENTIAL/PLATELET
BASOS ABS: 0 10*3/uL (ref 0.0–0.1)
BASOS PCT: 0 % (ref 0–1)
Eosinophils Absolute: 0.2 10*3/uL (ref 0.0–0.7)
Eosinophils Relative: 2 % (ref 0–5)
HEMATOCRIT: 42.6 % (ref 36.0–46.0)
Hemoglobin: 13.9 g/dL (ref 12.0–15.0)
LYMPHS PCT: 33 % (ref 12–46)
Lymphs Abs: 3.8 10*3/uL (ref 0.7–4.0)
MCH: 28.1 pg (ref 26.0–34.0)
MCHC: 32.6 g/dL (ref 30.0–36.0)
MCV: 86.1 fL (ref 78.0–100.0)
MONO ABS: 0.8 10*3/uL (ref 0.1–1.0)
MPV: 10.1 fL (ref 8.6–12.4)
Monocytes Relative: 7 % (ref 3–12)
Neutro Abs: 6.6 10*3/uL (ref 1.7–7.7)
Neutrophils Relative %: 58 % (ref 43–77)
Platelets: 331 10*3/uL (ref 150–400)
RBC: 4.95 MIL/uL (ref 3.87–5.11)
RDW: 14.5 % (ref 11.5–15.5)
WBC: 11.4 10*3/uL — AB (ref 4.0–10.5)

## 2015-08-10 MED ORDER — RANITIDINE HCL 300 MG PO TABS: ORAL_TABLET | ORAL | Status: DC

## 2015-08-10 MED ORDER — GLUCOSE BLOOD VI STRP: ORAL_STRIP | Status: DC

## 2015-08-10 MED ORDER — ONETOUCH ULTRASOFT LANCETS MISC: Status: DC
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
## 2015-08-11 ENCOUNTER — Other Ambulatory Visit: Payer: Self-pay | Admitting: Physician Assistant

## 2015-08-11 DIAGNOSIS — N6320 Unspecified lump in the left breast, unspecified quadrant: Secondary | ICD-10-CM

## 2015-08-11 LAB — BASIC METABOLIC PANEL WITH GFR
BUN: 12 mg/dL (ref 7–25)
CALCIUM: 9 mg/dL (ref 8.6–10.4)
CHLORIDE: 101 mmol/L (ref 98–110)
CO2: 25 mmol/L (ref 20–31)
CREATININE: 0.98 mg/dL (ref 0.50–0.99)
GFR, Est African American: 70 mL/min (ref >=60)
GFR, Est Non African American: 61 mL/min (ref >=60)
GLUCOSE: 88 mg/dL (ref 65–99)
Potassium: 4.2 mmol/L (ref 3.5–5.3)
Sodium: 137 mmol/L (ref 135–146)

## 2015-08-11 LAB — HEMOGLOBIN A1C
HEMOGLOBIN A1C: 6.1 % — AB (ref <5.7)
MEAN PLASMA GLUCOSE: 128 mg/dL — AB (ref <117)

## 2015-08-11 LAB — URINALYSIS, MICROSCOPIC ONLY
Bacteria, UA: NONE SEEN [HPF]
CASTS: NONE SEEN [LPF]
Crystals: NONE SEEN [HPF]
RBC / HPF: NONE SEEN RBC/HPF (ref <=2)
WBC, UA: NONE SEEN WBC/HPF (ref <=5)
YEAST: NONE SEEN [HPF]

## 2015-08-11 LAB — TSH: TSH: 3.675 u[IU]/mL (ref 0.350–4.500)

## 2015-08-11 LAB — FERRITIN: FERRITIN: 11 ng/mL (ref 10–291)

## 2015-08-11 LAB — INSULIN, FASTING: INSULIN FASTING, SERUM: 23.8 u[IU]/mL — AB (ref 2.0–19.6)

## 2015-08-11 LAB — URINALYSIS, ROUTINE W REFLEX MICROSCOPIC
BILIRUBIN URINE: NEGATIVE
KETONES UR: NEGATIVE
Leukocytes, UA: NEGATIVE
Nitrite: NEGATIVE
Protein, ur: NEGATIVE
SPECIFIC GRAVITY, URINE: 1.014 (ref 1.001–1.035)
pH: 6.5 (ref 5.0–8.0)

## 2015-08-11 LAB — HEPATIC FUNCTION PANEL
ALT: 15 U/L (ref 6–29)
AST: 16 U/L (ref 10–35)
Albumin: 3.9 g/dL (ref 3.6–5.1)
Alkaline Phosphatase: 78 U/L (ref 33–130)
BILIRUBIN INDIRECT: 0.3 mg/dL (ref 0.2–1.2)
Bilirubin, Direct: 0.1 mg/dL (ref <=0.2)
TOTAL PROTEIN: 6.2 g/dL (ref 6.1–8.1)
Total Bilirubin: 0.4 mg/dL (ref 0.2–1.2)

## 2015-08-11 LAB — MICROALBUMIN / CREATININE URINE RATIO: Creatinine, Urine: 89.1 mg/dL

## 2015-08-11 LAB — VITAMIN D 25 HYDROXY (VIT D DEFICIENCY, FRACTURES): Vit D, 25-Hydroxy: 38 ng/mL (ref 30–100)

## 2015-08-11 LAB — LIPID PANEL
CHOL/HDL RATIO: 3.6 ratio (ref <=5.0)
CHOLESTEROL: 133 mg/dL (ref 125–200)
HDL: 37 mg/dL — ABNORMAL LOW (ref >=46)
LDL Cholesterol: 48 mg/dL (ref <130)
Triglycerides: 239 mg/dL — ABNORMAL HIGH (ref <150)
VLDL: 48 mg/dL — AB (ref <30)

## 2015-08-11 LAB — MAGNESIUM: Magnesium: 2.1 mg/dL (ref 1.5–2.5)

## 2015-08-11 LAB — VITAMIN B12: VITAMIN B 12: 505 pg/mL (ref 211–911)

## 2015-08-11 LAB — IRON AND TIBC
%SAT: 10 % — AB (ref 11–50)
IRON: 41 ug/dL — AB (ref 45–160)
TIBC: 414 ug/dL (ref 250–450)
UIBC: 373 ug/dL (ref 125–400)

## 2015-08-12 ENCOUNTER — Encounter: Payer: Self-pay | Admitting: Physician Assistant

## 2015-08-13 ENCOUNTER — Ambulatory Visit (INDEPENDENT_AMBULATORY_CARE_PROVIDER_SITE_OTHER): Payer: Medicare Other | Admitting: Neurology

## 2015-08-13 ENCOUNTER — Other Ambulatory Visit: Payer: Self-pay | Admitting: Physician Assistant

## 2015-08-13 DIAGNOSIS — J309 Allergic rhinitis, unspecified: Secondary | ICD-10-CM

## 2015-08-13 MED ORDER — VITAMIN D (ERGOCALCIFEROL) 1.25 MG (50000 UNIT) PO CAPS: ORAL_CAPSULE | ORAL | Status: DC

## 2015-08-18 ENCOUNTER — Ambulatory Visit
Admission: RE | Admit: 2015-08-18 | Discharge: 2015-08-18 | Disposition: A | Discharge status: Home / Self Care | Payer: Medicare Other | Source: Ambulatory Visit | Attending: Physician Assistant | Admitting: Physician Assistant

## 2015-08-18 DIAGNOSIS — N6320 Unspecified lump in the left breast, unspecified quadrant: Secondary | ICD-10-CM

## 2015-09-01 ENCOUNTER — Ambulatory Visit: Payer: Medicare Other | Admitting: Allergy and Immunology

## 2015-09-16 ENCOUNTER — Ambulatory Visit (INDEPENDENT_AMBULATORY_CARE_PROVIDER_SITE_OTHER): Payer: Medicare Other

## 2015-09-16 DIAGNOSIS — J309 Allergic rhinitis, unspecified: Secondary | ICD-10-CM

## 2015-09-30 DIAGNOSIS — J3089 Other allergic rhinitis: Secondary | ICD-10-CM | POA: Diagnosis not present

## 2015-10-01 DIAGNOSIS — J301 Allergic rhinitis due to pollen: Secondary | ICD-10-CM | POA: Diagnosis not present

## 2015-10-07 ENCOUNTER — Other Ambulatory Visit: Payer: Self-pay | Admitting: Physician Assistant

## 2015-10-10 ENCOUNTER — Other Ambulatory Visit: Payer: Self-pay | Admitting: Internal Medicine

## 2015-10-20 ENCOUNTER — Encounter: Payer: Self-pay | Admitting: Allergy and Immunology

## 2015-10-20 ENCOUNTER — Ambulatory Visit (INDEPENDENT_AMBULATORY_CARE_PROVIDER_SITE_OTHER): Payer: Medicare Other

## 2015-10-20 ENCOUNTER — Ambulatory Visit (INDEPENDENT_AMBULATORY_CARE_PROVIDER_SITE_OTHER): Payer: Medicare Other | Admitting: Allergy and Immunology

## 2015-10-20 VITALS — BP 120/80 | HR 78 | Temp 97.5°F | Resp 15

## 2015-10-20 DIAGNOSIS — H1010 Acute atopic conjunctivitis, unspecified eye: Secondary | ICD-10-CM | POA: Diagnosis not present

## 2015-10-20 DIAGNOSIS — J309 Allergic rhinitis, unspecified: Secondary | ICD-10-CM | POA: Diagnosis not present

## 2015-10-20 DIAGNOSIS — J4520 Mild intermittent asthma, uncomplicated: Secondary | ICD-10-CM

## 2015-10-20 NOTE — Progress Notes (Signed)
FOLLOW UP NOTE  RE: Grace Barry MRN: [id number] DOB: December 11, 1950 [facility] [place] [address] [place] [id number] Date of Office Visit: 10/20/2015  Subjective:  Grace Barry is a 64 y.o. female who presents today for Allergic Rhinitis   Assessment:   1. Mild intermittent asthma, uncomplicated   2. Allergic rhinoconjunctivitis   3.      Complex medical history, including Diabetes Mellitus and obesity on multiple medication regime. Plan:   Patient Instructions  1.  Grace Barry will continue current regime, allergy injections every 4 weeks. 2.  Per Protocol Epi-pen/Benadryl as needed. 3.  Claritin or Zyrtec 10mg  once daily as needed. 4.  ProAir as needed. 5.  Follow-up in one year or sooner if needed.  HPI: Grace Barry returns to the office in follow-up of allergic rhinoconjunctivitis and asthma.  She has not been seen since August 2015, unclear why lack of follow-up.  She reports feeling very good and continues to receive immunotherapy without large local or systemic reactions.  She is very pleased with the benefit of injections and is requesting to continue, given minimal requirement for medications (acute or chronic) and rare symptoms.  She reports no cough, wheeze, shortness of breath or any chest symptoms including with exercise.  She denies using Benadryl or Epi-pen and no albuterol in more than a year.  She recalls in late September using Claritin a few times but nothing persisting.  Denies ED or urgent care visits, prednisone or antibiotic courses. Reports sleep and activity are normal.  She continues to follow with her other physicians regularly given other medical conditions.  Current Medications: 1.  Benadryl/Epi-pen as needed. 2.  Continues ASA, baclofen, calcium, Invokamet, Vit D3, Cymbalta, Nexium, Estrace, Flaxseed, Magnesium, Toprol, MVI, Ranitidine, Crestor, Synthroid, Topamax, Tumeric and Diovan.  Drug Allergies: Allergies  Allergen Reactions  .  Floxin [Ofloxacin] Hives  . Viberzi [Eluxadoline] Hives  . Doxycycline Rash    Objective:   Filed Vitals:   10/20/15 1520  BP: 120/80  Pulse: 78  Temp: 97.5 F (36.4 C)  Resp: 15   Physical Exam  Constitutional: She is well-developed, well-nourished, and in no distress.  HENT:  Head: Atraumatic.  Right Ear: Tympanic membrane and ear canal normal.  Left Ear: Tympanic membrane and ear canal normal.  Nose: Mucosal edema (minimal) present. No rhinorrhea. No epistaxis.  Mouth/Throat: Oropharynx is clear and moist and mucous membranes are normal. No oropharyngeal exudate, posterior oropharyngeal edema or posterior oropharyngeal erythema.  Neck: Neck supple.  Cardiovascular: Normal rate, S1 normal and S2 normal.   No murmur heard. Pulmonary/Chest: Effort normal. She has no wheezes. She has no rhonchi. She has no rales.  Lymphadenopathy:    She has no cervical adenopathy.    Diagnostics: Spirometry:  FVC 2.73--102%, FEV1 2.21--103%.    Merlin Grace M. Ishmael Holter, MD  cc: Grace Richards, MD

## 2015-10-20 NOTE — Patient Instructions (Signed)
Continue current regime, allergy injections every 4 weeks.  Epi-/benadryl as needed.  Claritin as needed.  ProAir as needed.  Follow-up in one year or sooner if needed.

## 2015-11-07 ENCOUNTER — Other Ambulatory Visit: Payer: Self-pay | Admitting: Internal Medicine

## 2015-11-08 ENCOUNTER — Telehealth: Payer: Self-pay | Admitting: *Deleted

## 2015-11-08 NOTE — Telephone Encounter (Signed)
Patient advised of BMD results.

## 2015-11-11 ENCOUNTER — Ambulatory Visit: Payer: Self-pay | Admitting: Physician Assistant

## 2015-12-01 ENCOUNTER — Ambulatory Visit (INDEPENDENT_AMBULATORY_CARE_PROVIDER_SITE_OTHER): Payer: Medicare Other | Admitting: Physician Assistant

## 2015-12-01 ENCOUNTER — Encounter: Payer: Self-pay | Admitting: Physician Assistant

## 2015-12-01 VITALS — BP 130/70 | HR 87 | Temp 97.2°F | Resp 16 | Ht 64.0 in | Wt 210.4 lb

## 2015-12-01 DIAGNOSIS — N182 Chronic kidney disease, stage 2 (mild): Secondary | ICD-10-CM | POA: Diagnosis not present

## 2015-12-01 DIAGNOSIS — G4733 Obstructive sleep apnea (adult) (pediatric): Secondary | ICD-10-CM

## 2015-12-01 DIAGNOSIS — K439 Ventral hernia without obstruction or gangrene: Secondary | ICD-10-CM

## 2015-12-01 DIAGNOSIS — K769 Liver disease, unspecified: Secondary | ICD-10-CM | POA: Diagnosis not present

## 2015-12-01 DIAGNOSIS — K219 Gastro-esophageal reflux disease without esophagitis: Secondary | ICD-10-CM

## 2015-12-01 DIAGNOSIS — G43009 Migraine without aura, not intractable, without status migrainosus: Secondary | ICD-10-CM

## 2015-12-01 DIAGNOSIS — F329 Major depressive disorder, single episode, unspecified: Secondary | ICD-10-CM

## 2015-12-01 DIAGNOSIS — F32A Depression, unspecified: Secondary | ICD-10-CM

## 2015-12-01 DIAGNOSIS — E559 Vitamin D deficiency, unspecified: Secondary | ICD-10-CM

## 2015-12-01 DIAGNOSIS — R42 Dizziness and giddiness: Secondary | ICD-10-CM

## 2015-12-01 DIAGNOSIS — J309 Allergic rhinitis, unspecified: Secondary | ICD-10-CM

## 2015-12-01 DIAGNOSIS — M1990 Unspecified osteoarthritis, unspecified site: Secondary | ICD-10-CM

## 2015-12-01 DIAGNOSIS — I10 Essential (primary) hypertension: Secondary | ICD-10-CM

## 2015-12-01 DIAGNOSIS — Z79899 Other long term (current) drug therapy: Secondary | ICD-10-CM

## 2015-12-01 DIAGNOSIS — E039 Hypothyroidism, unspecified: Secondary | ICD-10-CM

## 2015-12-01 DIAGNOSIS — R6889 Other general symptoms and signs: Secondary | ICD-10-CM | POA: Diagnosis not present

## 2015-12-01 DIAGNOSIS — E785 Hyperlipidemia, unspecified: Secondary | ICD-10-CM

## 2015-12-01 DIAGNOSIS — R197 Diarrhea, unspecified: Secondary | ICD-10-CM

## 2015-12-01 DIAGNOSIS — J45909 Unspecified asthma, uncomplicated: Secondary | ICD-10-CM | POA: Diagnosis not present

## 2015-12-01 DIAGNOSIS — E1129 Type 2 diabetes mellitus with other diabetic kidney complication: Secondary | ICD-10-CM

## 2015-12-01 DIAGNOSIS — E669 Obesity, unspecified: Secondary | ICD-10-CM | POA: Diagnosis not present

## 2015-12-01 DIAGNOSIS — Z86010 Personal history of colonic polyps: Secondary | ICD-10-CM

## 2015-12-01 DIAGNOSIS — Z0001 Encounter for general adult medical examination with abnormal findings: Secondary | ICD-10-CM

## 2015-12-01 DIAGNOSIS — Z0000 Encounter for general adult medical examination without abnormal findings: Secondary | ICD-10-CM

## 2015-12-01 DIAGNOSIS — H1010 Acute atopic conjunctivitis, unspecified eye: Secondary | ICD-10-CM

## 2015-12-01 LAB — CBC WITH DIFFERENTIAL/PLATELET
BASOS ABS: 0.1 10*3/uL (ref 0.0–0.1)
Basophils Relative: 1 % (ref 0–1)
EOS ABS: 0.2 10*3/uL (ref 0.0–0.7)
EOS PCT: 2 % (ref 0–5)
HEMATOCRIT: 43.4 % (ref 36.0–46.0)
Hemoglobin: 14.3 g/dL (ref 12.0–15.0)
LYMPHS ABS: 2.9 10*3/uL (ref 0.7–4.0)
LYMPHS PCT: 30 % (ref 12–46)
MCH: 28.2 pg (ref 26.0–34.0)
MCHC: 32.9 g/dL (ref 30.0–36.0)
MCV: 85.6 fL (ref 78.0–100.0)
MONOS PCT: 6 % (ref 3–12)
MPV: 10.1 fL (ref 8.6–12.4)
Monocytes Absolute: 0.6 10*3/uL (ref 0.1–1.0)
NEUTROS PCT: 61 % (ref 43–77)
Neutro Abs: 5.9 10*3/uL (ref 1.7–7.7)
Platelets: 313 10*3/uL (ref 150–400)
RBC: 5.07 MIL/uL (ref 3.87–5.11)
RDW: 15 % (ref 11.5–15.5)
WBC: 9.6 10*3/uL (ref 4.0–10.5)

## 2015-12-01 LAB — LIPID PANEL
CHOL/HDL RATIO: 3.1 ratio (ref <=5.0)
CHOLESTEROL: 135 mg/dL (ref 125–200)
HDL: 44 mg/dL — ABNORMAL LOW (ref >=46)
LDL Cholesterol: 53 mg/dL (ref <130)
Triglycerides: 190 mg/dL — ABNORMAL HIGH (ref <150)
VLDL: 38 mg/dL — ABNORMAL HIGH (ref <30)

## 2015-12-01 LAB — HEPATIC FUNCTION PANEL
ALT: 15 U/L (ref 6–29)
AST: 22 U/L (ref 10–35)
Albumin: 4.2 g/dL (ref 3.6–5.1)
Alkaline Phosphatase: 73 U/L (ref 33–130)
BILIRUBIN DIRECT: 0.1 mg/dL (ref <=0.2)
Indirect Bilirubin: 0.4 mg/dL (ref 0.2–1.2)
TOTAL PROTEIN: 6.4 g/dL (ref 6.1–8.1)
Total Bilirubin: 0.5 mg/dL (ref 0.2–1.2)

## 2015-12-01 LAB — BASIC METABOLIC PANEL WITH GFR
BUN: 14 mg/dL (ref 7–25)
CALCIUM: 9.1 mg/dL (ref 8.6–10.4)
CO2: 22 mmol/L (ref 20–31)
CREATININE: 1.1 mg/dL — AB (ref 0.50–0.99)
Chloride: 103 mmol/L (ref 98–110)
GFR, EST AFRICAN AMERICAN: 60 mL/min (ref >=60)
GFR, Est Non African American: 52 mL/min — ABNORMAL LOW (ref >=60)
Glucose, Bld: 81 mg/dL (ref 65–99)
Potassium: 4.2 mmol/L (ref 3.5–5.3)
SODIUM: 139 mmol/L (ref 135–146)

## 2015-12-01 LAB — MAGNESIUM: MAGNESIUM: 1.9 mg/dL (ref 1.5–2.5)

## 2015-12-01 LAB — TSH: TSH: 21.074 u[IU]/mL — AB (ref 0.350–4.500)

## 2015-12-01 LAB — HEMOGLOBIN A1C
Hgb A1c MFr Bld: 6.3 % — ABNORMAL HIGH (ref <5.7)
MEAN PLASMA GLUCOSE: 134 mg/dL — AB (ref <117)

## 2015-12-01 NOTE — Progress Notes (Signed)
MEDICARE ANNUAL WELLNESS VISIT AND FOLLOW UP  Assessment:   1. Essential hypertension - continue medications, DASH diet, exercise and monitor at home. Call if greater than 130/80.  - CBC with Differential/Platelet - Hepatic function panel - TSH  2. Obstructive sleep apnea Sleep apnea- continue mouth piece, weight loss advised.   3. Asthma, unspecified asthma severity, uncomplicated controlled  4. Gastroesophageal reflux disease without esophagitis Continue PPI/H2 blocker, diet discussed  5. Liver disease Check labs, avoid tylenol, alcohol, weight loss advised.   6. Other specified hypothyroidism Hypothyroidism-check TSH level, continue medications the same, reminded to take on an empty stomach 30-45mins before food.   7. 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 - Hemoglobin A1c - Insulin, fasting - HM DIABETES FOOT EXAM  8. CKD stage 2  Increase fluids, avoid NSAIDS, monitor sugars, will monitor - BASIC METABOLIC PANEL WITH GFR  9. Depression Grief reaction  10. Hyperlipidemia -continue medications, check lipids, decrease fatty foods, increase activity.  - Lipid panel  11. Medication management - Magnesium  12. At low risk for fall  13. Encounter for general adult medical examination with abnormal findings  14. Obesity (BMI 30-39.9) Obesity with co morbidities- long discussion about weight loss, diet, and exercise  15. Arthritis RICE, NSAIDS, exercises given, if not better get xray and PT referral or ortho referral.   16. Ventral hernia without obstruction or gangrene No symptoms  17. Diarrhea IBS, controlled  18. History of colonic polyps Due 3 years  Over 30 minutes of exam,  counseling, chart review, and critical decision making was performed  Plan:   During the course of the visit the patient was educated and counseled about appropriate screening and preventive services including:    Pneumococcal vaccine   Influenza vaccine  Td vaccine  Screening electrocardiogram  Screening mammography  Bone densitometry screening  Colorectal cancer screening  Diabetes screening  Glaucoma screening  Nutrition counseling   Advanced directives: given info/requested  Conditions/risks identified: BMI: Discussed weight loss, diet, and increase physical activity.  Increase physical activity: AHA recommends 150 minutes of physical activity a week.  Medications reviewed DEXA- requested Diabetes is   at goal, ACE/ARB therapy: Yes. Urinary Incontinence is not an issue: discussed non pharmacology and pharmacology options.  Fall risk: low- discussed PT, home fall assessment, medications.    Subjective:   Grace Barry is a 65 y.o. female who presents for Welcome to medicare visit and 3 month follow up on hypertension, prediabetes, hyperlipidemia, vitamin D def.  Date of last medicare wellness visit was 12/2014  Her blood pressure has been controlled at home, she has been off the HCTZ, she is on diovan 320 cut in half due to hypotension and Metoprolol 25mg  XL for palpitations,BP: 130/70 mmHg  She does workout. She denies chest pain, shortness of breath, dizziness.  She is on cholesterol medication, crestor 20mg  daily and denies myalgias. Her cholesterol is at goal. The cholesterol was:   Lab Results  Component Value Date   CHOL 133 08/10/2015   HDL 37* 08/10/2015   LDLCALC 48 08/10/2015   TRIG 239* 08/10/2015   CHOLHDL 3.6 08/10/2015   She has been working on diet and exercise for Diabetes with diabetic chronic kidney disease, she is on bASA, she is on ACE/ARB, and denies paresthesia of the feet, polydipsia,  polyuria and visual disturbances. Last A1C was:   Lab Results  Component Value Date   HGBA1C 6.1* 08/10/2015  Patient is on Vitamin D supplement. Lab Results  Component Value Date   VD25OH 38 08/10/2015  She is on thyroid medication. Her medication was not changed last visit,.  Lab Results  Component Value Date   TSH 3.675 08/10/2015   She has followed with Dr. Toy Cookey and is on a mouth piece that is helping her significantly with sleep, feeling more rested.  Has history of migraines, increased in Dec with stress from passing of son, Tiffany Kocher a year ago and went to see Dr. Domingo Cocking for shots.  Has been visiting new grandbaby in [place] with her son Wille Glaser, they are getting house there soon and will go up to help move.  Sees Dr. Deatra Ina for diarrhea/IBS, worsened by her son's passing.  BMI is Body mass index is 36.1 kg/(m^2)., she is working on diet and exercise. Wt Readings from Last 3 Encounters:  12/01/15 210 lb 6.4 oz (95.437 kg)  08/10/15 205 lb (92.987 kg)  05/19/15 210 lb 6.4 oz (95.437 kg)    Medication Review Current Outpatient Prescriptions on File Prior to Visit  Medication Sig Dispense Refill  . albuterol (PROAIR HFA) 108 (90 BASE) MCG/ACT inhaler Inhale 1-2 puffs into the lungs every 6 (six) hours as needed for wheezing or shortness of breath. Reported on 10/20/2015    . ALPRAZolam (XANAX) 0.5 MG tablet Take 1/2 to 1 tablet 2 to 3 x day if needed for anxiety 270 tablet 1  . aspirin 81 MG tablet Take 81 mg by mouth daily.    . baclofen (LIORESAL) 10 MG tablet Take 10 mg by mouth 2 (two) times daily as needed for muscle spasms.    Marland Kitchen CALCIUM PO Take 1,200 mg by mouth daily.    . Canagliflozin-Metformin HCl (INVOKAMET) [phone] MG TABS TAKE 1 TABLET BY MOUTH 2 TIMES A DAY WITH FOOD 180 tablet 3  . cetirizine (ZYRTEC) 10 MG tablet Take 10 mg by mouth daily. Reported on 10/20/2015    . Cholecalciferol (VITAMIN D3 PO) Take 5,000 Int'l Units by mouth daily.    . DULoxetine (CYMBALTA) 60 MG capsule TAKE ONE CAPSULE BY MOUTH EVERY DAY  90 capsule 3  . EPINEPHrine (EPIPEN 2-PAK) 0.3 mg/0.3 mL IJ SOAJ injection Inject 0.3 mg into the muscle once.    Marland Kitchen esomeprazole (NEXIUM) 40 MG capsule TAKE ONE CAPSULE BY MOUTH EVERY DAY 90 capsule 1  . estradiol (ESTRACE) 1 MG tablet TAKE ONE-HALF TO ONE TABLET BY MOUTH ONCE DAILY 90 tablet 0  . Flaxseed, Linseed, (FLAXSEED OIL PO) Take 1,300 mg by mouth daily.     . fluconazole (DIFLUCAN) 150 MG tablet May take 1 tablet weekly for yeast infection if needed 4 tablet 5  . glucose blood (ONE TOUCH ULTRA TEST) test strip Check glucose once dialy 50 each 6  . Lancets (ONETOUCH ULTRASOFT) lancets Take sugar once daily 50 each 12  . Magnesium 250 MG TABS Take 250 mg by mouth 2 (two) times daily.    . metoprolol succinate (TOPROL-XL) 25 MG 24 hr tablet TAKE 1 TABLET BY MOUTH EVERY DAY 90 tablet 1  . Multiple Vitamins-Minerals (MULTIVITAMIN PO) Take 1 tablet by mouth daily.     Marland Kitchen PRESCRIPTION MEDICATION Inject as directed every 30 (thirty) days. Allergy Shot    . ranitidine (ZANTAC) 300 MG tablet Take twice a day while trying to get off PPI, then can go to  once at night 60 tablet 3  . rosuvastatin (CRESTOR) 20 MG tablet TAKE 1 TABLET BY MOUTH AT BEDTIME 90 tablet 1  . SYNTHROID 200 MCG tablet Take 1 tablet daily on an empty stomach for 30 minutes or as directed. 90 tablet 1  . topiramate (TOPAMAX) 100 MG tablet Take 100 mg by mouth daily.    . TURMERIC CURCUMIN PO Take 1 capsule by mouth 2 (two) times daily.    . valsartan (DIOVAN) 320 MG tablet TAKE 1/2 TO 1 TABLET BY MOUTH DAILY FOR BLOOD PRESSURE 90 tablet 1  . Vitamin D, Ergocalciferol, (DRISDOL) 50000 UNITS CAPS capsule 4 days a week for vitamin D def 60 capsule 1   No current facility-administered medications on file prior to visit.    Current Problems (verified) Patient Active Problem List   Diagnosis Date Noted  . Allergic rhinoconjunctivitis 07/10/2015  . Vitamin D deficiency disease 05/19/2015  . Medication management 05/19/2015  .  Medicare annual wellness visit, subsequent 05/19/2015  . Migraine 02/19/2015  . Vertigo 02/19/2015  . History of colonic polyps 01/15/2015  . Diarrhea 09/01/2014  . CKD stage 2  07/01/2014  . Obesity (BMI 36.10) 07/01/2014  . Hyperlipidemia   . Liver disease   . Arthritis   . Asthma   . Ventral hernia 06/12/2011  . Hypothyroidism 12/15/2010  . T2_NIDDM w/CKD (GFR 56 ml/min) 12/15/2010  . Depression 12/15/2010  . Obstructive sleep apnea 12/15/2010  . Essential hypertension 12/15/2010  . GERD 12/15/2010    Screening Tests Health Maintenance  Topic Date Due  . Hepatitis C Screening  07/03/1951  . PNA vac Low Risk Adult (2 of 2 - PPSV23) 07/02/2015  . OPHTHALMOLOGY EXAM  12/16/2015 (Originally 12/10/2014)  . HEMOGLOBIN A1C  02/08/2016  . INFLUENZA VACCINE  05/30/2016  . FOOT EXAM  08/09/2016  . MAMMOGRAM  08/17/2017  . COLONOSCOPY  11/17/2017  . TETANUS/TDAP  08/09/2025  . DEXA SCAN  Completed  . ZOSTAVAX  Completed     Immunization History  Administered Date(s) Administered  . Influenza, High Dose Seasonal PF 08/10/2015  . Pneumococcal Conjugate-13 07/01/2014  . Pneumococcal Polysaccharide-23 10/09/2002, 05/26/2008  . Td 10/09/2002, 10/30/2010  . Tdap 08/10/2015  . Zoster 06/20/2011    Preventative care: Tetanus: 2016 Pneumovax: 2009 Prevnar 13: 2015 Flu vaccine: 2016 Zostavax: 2012 Pap: 2009 remote declines another MGM: 07/2015 DEXA: 2008 osteopenia Colonoscopy: 10/2014 due 2019 precancerous polyps EGD: 10/2014 + gastritis  Names of Other Physician/Practitioners you currently use: 1. [facility]- here for primary care 2. Dr. Dayna Ramus, eye doctor, last visit 11/2014 3. Dr. Sarajane Jews, dentist, last visit q 6 months 4. Dr. Domingo Cocking, neuro 5. Dr. Toy Cookey, sleep apnea 6. Dr. Deatra Ina GI Patient Care Team: Unk Pinto, MD as PCP - General (Internal Medicine)     Medication List       This list is accurate as of: 12/01/15  10:51 AM.  Always use your most recent med list.               ALPRAZolam 0.5 MG tablet  Commonly known as:  XANAX  Take 1/2 to 1 tablet 2 to 3 x day if needed for anxiety     aspirin 81 MG tablet  Take 81 mg by mouth daily.     baclofen 10 MG tablet  Commonly known as:  LIORESAL  Take 10 mg by mouth 2 (two) times daily as needed for muscle spasms.     CALCIUM PO  Take  1,200 mg by mouth daily.     Canagliflozin-Metformin HCl [id number] MG Tabs  Commonly known as:  INVOKAMET  TAKE 1 TABLET BY MOUTH 2 TIMES A DAY WITH FOOD     cetirizine 10 MG tablet  Commonly known as:  ZYRTEC  Take 10 mg by mouth daily. Reported on 10/20/2015     DULoxetine 60 MG capsule  Commonly known as:  CYMBALTA  TAKE ONE CAPSULE BY MOUTH EVERY DAY     EPIPEN 2-PAK 0.3 mg/0.3 mL Soaj injection  Generic drug:  EPINEPHrine  Inject 0.3 mg into the muscle once.     esomeprazole 40 MG capsule  Commonly known as:  NEXIUM  TAKE ONE CAPSULE BY MOUTH EVERY DAY     estradiol 1 MG tablet  Commonly known as:  ESTRACE  TAKE ONE-HALF TO ONE TABLET BY MOUTH ONCE DAILY     FLAXSEED OIL PO  Take 1,300 mg by mouth daily.     fluconazole 150 MG tablet  Commonly known as:  DIFLUCAN  May take 1 tablet weekly for yeast infection if needed     glucose blood test strip  Commonly known as:  ONE TOUCH ULTRA TEST  Check glucose once dialy     Magnesium 250 MG Tabs  Take 250 mg by mouth 2 (two) times daily.     metoprolol succinate 25 MG 24 hr tablet  Commonly known as:  TOPROL-XL  TAKE 1 TABLET BY MOUTH EVERY DAY     MULTIVITAMIN PO  Take 1 tablet by mouth daily.     onetouch ultrasoft lancets  Take sugar once daily     PRESCRIPTION MEDICATION  Inject as directed every 30 (thirty) days. Allergy Shot     PROAIR HFA 108 (90 Base) MCG/ACT inhaler  Generic drug:  albuterol  Inhale 1-2 puffs into the lungs every 6 (six) hours as needed for wheezing or shortness of breath. Reported on 10/20/2015      ranitidine 300 MG tablet  Commonly known as:  ZANTAC  Take twice a day while trying to get off PPI, then can go to once at night     rosuvastatin 20 MG tablet  Commonly known as:  CRESTOR  TAKE 1 TABLET BY MOUTH AT BEDTIME     SYNTHROID 200 MCG tablet  Generic drug:  levothyroxine  Take 1 tablet daily on an empty stomach for 30 minutes or as directed.     topiramate 100 MG tablet  Commonly known as:  TOPAMAX  Take 100 mg by mouth daily.     TURMERIC CURCUMIN PO  Take 1 capsule by mouth 2 (two) times daily.     valsartan 320 MG tablet  Commonly known as:  DIOVAN  TAKE 1/2 TO 1 TABLET BY MOUTH DAILY FOR BLOOD PRESSURE     Vitamin D (Ergocalciferol) 50000 units Caps capsule  Commonly known as:  DRISDOL  4 days a week for vitamin D def     VITAMIN D3 PO  Take 5,000 Int'l Units by mouth daily.        Past Surgical History  Procedure Laterality Date  . Total knee arthroplasty      bilateral  . Cholecystectomy    . Abdominal hysterectomy    . Cesarean section      x2  . Tonsillectomy    . Knee arthroscopy      bilateral  . Carpal tunnel release      bilateral  . Hernia repair  07/24/11  ventral hernia   Family History  Problem Relation Age of Onset  . Breast cancer Mother   . Hypertension Father   . Hyperlipidemia Father   . Prostate cancer Father   . Hyperlipidemia Sister   . Esophageal cancer Son 49    Died 11/13/2013  . Colon cancer Son   . Stomach cancer Son   . Colon polyps Neg Hx   . Diabetes Neg Hx   . Kidney disease Neg Hx   . Migraines Neg Hx    Review of Systems:  Review of Systems  Constitutional: Negative.   HENT: Negative for congestion, ear discharge, ear pain, hearing loss, nosebleeds, sore throat and tinnitus.   Eyes: Negative.   Respiratory: Negative.  Negative for stridor.   Cardiovascular: Negative.   Gastrointestinal: Positive for diarrhea. Negative for heartburn, nausea, vomiting, abdominal pain, constipation, blood in stool and  melena.  Genitourinary: Negative.   Musculoskeletal: Negative.  Negative for falls.  Skin: Negative.   Neurological: Positive for headaches. Negative for dizziness, tingling, tremors, sensory change, speech change, focal weakness, seizures and loss of consciousness.  Endo/Heme/Allergies: Negative.   Psychiatric/Behavioral: Negative.  Negative for depression.    Risk Factors: Osteoporosis/FallRisk: postmenopausal estrogen deficiency and dietary calcium and/or vitamin D deficiency In the past year have you fallen or had a near fall?:No History of fracture in the past year: no  Tobacco History  Substance Use Topics  . Smoking status: Never Smoker   . Smokeless tobacco: Not on file  . Alcohol Use: 0.0 oz/week    0 Not specified per week     Comment: Rarely   She does not smoke.  Patient is not a former smoker. Are there smokers in your home (other than you)?  No  Alcohol Current alcohol use: rare  Caffeine Current caffeine use: coffee 1 /day  Exercise Current exercise: walking  Nutrition/Diet Current diet: in general, a "healthy" diet    Cardiac risk factors: advanced age (older than 60 for men, 27 for women), diabetes mellitus, dyslipidemia, hypertension and obesity (BMI >= 30 kg/m2).  Depression Screen- some reactive depression, 1 year ago lost son, elliot to cancer (Note: if answer to either of the following is "Yes", a more complete depression screening is indicated)   Q1: Over the past two weeks, have you felt down, depressed or hopeless? No  Q2: Over the past two weeks, have you felt little interest or pleasure in doing things? No  Have you lost interest or pleasure in daily life? No  Do you often feel hopeless? No  Do you cry easily over simple problems? No  Activities of Daily Living In your present state of health, do you have any difficulty performing the following activities?:  Driving? No Managing money?  No Feeding yourself? No Getting from bed to chair?  No Climbing a flight of stairs? No Preparing food and eating?: No Bathing or showering? No Getting dressed: No Getting to the toilet? No Using the toilet:No Moving around from place to place: No  Vision Difficulties: No  Hearing Difficulties: No Do you often ask people to speak up or repeat themselves? No Do you experience ringing or noises in your ears? No Do you have difficulty understanding soft or whispered voices? No  Cognition  Do you feel that you have a problem with memory?No  Do you often misplace items? No  Do you feel safe at home?  Yes  Advanced directives Does patient have a [place]? Yes  Does patient have a Living Will? Yes   Objective:   Blood pressure 110/68, pulse 84, temperature 97.7 F (36.5 C), resp. rate 16, height 5' 2.5" (1.588 m), weight 206 lb (93.441 kg). Body mass index is 37.05 kg/(m^2).  General appearance: alert, no distress, WD/WN,  female Cognitive Testing  Alert? Yes  Normal Appearance?Yes  Oriented to person? Yes  Place? Yes   Time? Yes  Recall of three objects?  Yes  Can perform simple calculations? Yes  Displays appropriate judgment?Yes  Can read the correct time from a watch face?Yes  HEENT: normocephalic, sclerae anicteric, TMs pearly, nares patent, no discharge or erythema, pharynx normal Oral cavity: MMM, no lesions Neck: supple, no lymphadenopathy, no thyromegaly, no masses Heart: RRR, normal S1, S2, no murmurs Lungs: CTA bilaterally, no wheezes, rhonchi, or rales Abdomen: +bs, soft, non tender, non distended, no masses, no hepatomegaly, no splenomegaly Musculoskeletal: nontender, no swelling, no obvious deformity Extremities: no edema, no cyanosis, no clubbing Pulses: 2+ symmetric, upper and lower extremities, normal cap refill Neurological: alert, oriented x 3, CN2-12 intact, strength normal upper extremities and lower extremities, sensation normal throughout, DTRs 2+ throughout, no cerebellar signs,  gait normal Psychiatric: normal affect, behavior normal, pleasant  Breast: defer Gyn: defer Rectal: defer  Medicare Attestation I have personally reviewed: The patient's medical and social history Their use of alcohol, tobacco or illicit drugs Their current medications and supplements The patient's functional ability including ADLs,fall risks, home safety risks, cognitive, and hearing and visual impairment Diet and physical activities Evidence for depression or mood disorders  The patient's weight, height, BMI, and visual acuity have been recorded in the chart.  I have made referrals, counseling, and provided education to the patient based on review of the above and I have provided the patient with a written personalized care plan for preventive services.     Vicie Mutters, PA-C   12/01/2015

## 2015-12-02 LAB — VITAMIN D 25 HYDROXY (VIT D DEFICIENCY, FRACTURES): Vit D, 25-Hydroxy: 85 ng/mL (ref 30–100)

## 2015-12-06 ENCOUNTER — Ambulatory Visit (INDEPENDENT_AMBULATORY_CARE_PROVIDER_SITE_OTHER): Payer: Medicare Other

## 2015-12-06 DIAGNOSIS — J309 Allergic rhinitis, unspecified: Secondary | ICD-10-CM | POA: Diagnosis not present

## 2015-12-18 ENCOUNTER — Encounter: Payer: Self-pay | Admitting: *Deleted

## 2015-12-26 ENCOUNTER — Encounter (HOSPITAL_COMMUNITY): Payer: Self-pay | Admitting: *Deleted

## 2015-12-26 ENCOUNTER — Emergency Department (HOSPITAL_COMMUNITY)
Admission: EM | Admit: 2015-12-26 | Discharge: 2015-12-26 | Disposition: A | Discharge status: Home / Self Care | Payer: Medicare Other | Source: Home / Self Care | Attending: Emergency Medicine | Admitting: Emergency Medicine

## 2015-12-26 DIAGNOSIS — H68003 Unspecified Eustachian salpingitis, bilateral: Secondary | ICD-10-CM

## 2015-12-26 MED ORDER — FLUTICASONE PROPIONATE 50 MCG/ACT NA SUSP: 2.0000 | Freq: Every day | NASAL | Status: DC

## 2015-12-26 MED ORDER — CETIRIZINE HCL 10 MG PO CAPS: ORAL_CAPSULE | ORAL | Status: DC

## 2015-12-26 NOTE — ED Provider Notes (Signed)
CSN: [id number]     Arrival date & time 12/26/15  1432 History   First MD Initiated Contact with Patient 12/26/15 1605     Chief Complaint  Patient presents with  . Ear Fullness   (Consider location/radiation/quality/duration/timing/severity/associated sxs/prior Treatment) HPI History obtained from patient:   LOCATION: left ear SEVERITY:no pain, decreased hearing DURATION:about 5 days CONTEXT:sudden onset with mild URI symptoms QUALITY: MODIFYING FACTORS:OTC meds without relief, ASSOCIATED SYMPTOMS:none TIMING:constant left ear episodic right ear OCCUPATION:  Past Medical History  Diagnosis Date  . Sleep difficulties   . Liver disease   . Ventral hernia   . Hyperlipidemia   . Hypertension   . GERD (gastroesophageal reflux disease)   . Type II or unspecified type diabetes mellitus without mention of complication, not stated as uncontrolled   . Thyroid disease     hypothyroidism  . Arthritis   . Asthma   . Depression   . Anxiety   . Migraines   . Obesity   . Sleep apnea   . Allergy   . Fibromyalgia   . Migraine 02/19/2015  . Vertigo 02/19/2015   Past Surgical History  Procedure Laterality Date  . Total knee arthroplasty      bilateral  . Cholecystectomy    . Abdominal hysterectomy    . Cesarean section      x2  . Tonsillectomy    . Knee arthroscopy      bilateral  . Carpal tunnel release      bilateral  . Hernia repair  07/24/11    ventral hernia  . Joint replacement     Family History  Problem Relation Age of Onset  . Breast cancer Mother   . Hypertension Father   . Hyperlipidemia Father   . Prostate cancer Father   . Hyperlipidemia Sister   . Esophageal cancer Son 22    Died November 21, 2013  . Colon cancer Son   . Stomach cancer Son   . Colon polyps Neg Hx   . Diabetes Neg Hx   . Kidney disease Neg Hx   . Migraines Neg Hx    Social History  Substance Use Topics  . Smoking status: Never Smoker   . Smokeless tobacco: None  . Alcohol Use: 0.0  oz/week    0 Standard drinks or equivalent per week     Comment: occasional   OB History    No data available     Review of Systems ROS +'vecongestion  DENIES; CHANGE IN ACTIVITY,  HEADACHE, CHEST PAIN, ABDOMINAL PAIN, SHORTNESS OF BREATH, WHEEZING, EXCESSIVE THIRST OR URINATION, SKIN RASH, DIFFICULTY WITH URINATION, DYSURIA, AGITATION, BALANCE ISSUES  Allergies  Floxin; Viberzi; and Doxycycline  Home Medications   Prior to Admission medications   Medication Sig Start Date End Date Taking? Authorizing Provider  ALPRAZolam Duanne Moron) 0.5 MG tablet Take 1/2 to 1 tablet 2 to 3 x day if needed for anxiety 07/10/15 01/07/16 Yes Unk Pinto, MD  aspirin 81 MG tablet Take 81 mg by mouth daily.   Yes Historical Provider, MD  baclofen (LIORESAL) 10 MG tablet Take 10 mg by mouth 2 (two) times daily as needed for muscle spasms.   Yes Historical Provider, MD  CALCIUM PO Take 1,200 mg by mouth daily.   Yes Historical Provider, MD  Canagliflozin-Metformin HCl (INVOKAMET) [id number] MG TABS TAKE 1 TABLET BY MOUTH 2 TIMES A DAY WITH FOOD 10/21/14  Yes Vicie Mutters, PA-C  Cholecalciferol (VITAMIN D3 PO) Take 5,000 Int'l Units by mouth daily.  Yes Historical Provider, MD  DULoxetine (CYMBALTA) 60 MG capsule TAKE ONE CAPSULE BY MOUTH EVERY DAY 11/08/14  Yes Unk Pinto, MD  esomeprazole (NEXIUM) 40 MG capsule TAKE ONE CAPSULE BY MOUTH EVERY DAY 07/10/15  Yes Unk Pinto, MD  estradiol (ESTRACE) 1 MG tablet TAKE ONE-HALF TO ONE TABLET BY MOUTH ONCE DAILY 10/07/15  Yes Courtney Forcucci, PA-C  Flaxseed, Linseed, (FLAXSEED OIL PO) Take 1,300 mg by mouth daily.    Yes Historical Provider, MD  glucose blood (ONE TOUCH ULTRA TEST) test strip Check glucose once dialy 08/10/15  Yes Vicie Mutters, PA-C  Lancets [facility] ULTRASOFT) lancets Take sugar once daily 08/10/15  Yes Vicie Mutters, PA-C  Magnesium 250 MG TABS Take 250 mg by mouth 2 (two) times daily.   Yes Historical Provider, MD  metoprolol  succinate (TOPROL-XL) 25 MG 24 hr tablet TAKE 1 TABLET BY MOUTH EVERY DAY 11/07/15  Yes Unk Pinto, MD  Multiple Vitamins-Minerals (MULTIVITAMIN PO) Take 1 tablet by mouth daily.    Yes Historical Provider, MD  PRESCRIPTION MEDICATION Inject as directed every 30 (thirty) days. Allergy Shot   Yes Historical Provider, MD  ranitidine (ZANTAC) 300 MG tablet Take twice a day while trying to get off PPI, then can go to once at night 08/10/15  Yes Vicie Mutters, PA-C  rosuvastatin (CRESTOR) 20 MG tablet TAKE 1 TABLET BY MOUTH AT BEDTIME 10/10/15  Yes Unk Pinto, MD  SYNTHROID 200 MCG tablet Take 1 tablet daily on an empty stomach for 30 minutes or as directed. 07/10/15 01/07/16 Yes Unk Pinto, MD  topiramate (TOPAMAX) 100 MG tablet Take 100 mg by mouth daily.   Yes Historical Provider, MD  TURMERIC CURCUMIN PO Take 1 capsule by mouth 2 (two) times daily.   Yes Historical Provider, MD  valsartan (DIOVAN) 320 MG tablet TAKE 1/2 TO 1 TABLET BY MOUTH DAILY FOR BLOOD PRESSURE 03/24/15  Yes Unk Pinto, MD  Vitamin D, Ergocalciferol, (DRISDOL) 50000 UNITS CAPS capsule 4 days a week for vitamin D def 08/13/15  Yes Vicie Mutters, PA-C  albuterol (PROAIR HFA) 108 (90 BASE) MCG/ACT inhaler Inhale 1-2 puffs into the lungs every 6 (six) hours as needed for wheezing or shortness of breath. Reported on 10/20/2015    Historical Provider, MD  cetirizine (ZYRTEC) 10 MG tablet Take 10 mg by mouth daily. Reported on 10/20/2015    Historical Provider, MD  EPINEPHrine (EPIPEN 2-PAK) 0.3 mg/0.3 mL IJ SOAJ injection Inject 0.3 mg into the muscle once.    Historical Provider, MD  fluconazole (DIFLUCAN) 150 MG tablet May take 1 tablet weekly for yeast infection if needed 07/10/15   Unk Pinto, MD   Meds Ordered and Administered this Visit  Medications - No data to display  BP 137/84 mmHg  Pulse 106  Temp(Src) 98 F (36.7 C) (Oral)  Resp 16  SpO2 100% No data found.   Physical Exam  Constitutional:  She is oriented to person, place, and time. She appears well-developed and well-nourished.  HENT:  Head: Normocephalic and atraumatic.  Right Ear: External ear normal.  Left Ear: External ear normal.  Mouth/Throat: Oropharynx is clear and moist.  TM's are clear, no bulging, but minimal motion of the TM's on insufflation   Eyes: Conjunctivae are normal.  Neck: Normal range of motion. Neck supple.  Pulmonary/Chest: Effort normal and breath sounds normal.  Neurological: She is alert and oriented to person, place, and time.  Skin: Skin is warm and dry.  Psychiatric: She has a normal mood and  affect. Her behavior is normal.  Nursing note and vitals reviewed.   ED Course  Procedures (including critical care time)  Labs Review Labs Reviewed - No data to display  Imaging Review No results found.   Visual Acuity Review  Right Eye Distance:   Left Eye Distance:   Bilateral Distance:    Right Eye Near:   Left Eye Near:    Bilateral Near:        Review med history and medical problem list. Limited on medication choices limited to cetrizine and Flonase.   MDM   1. Eustachian catarrh, bilateral    Patient is reassured that there are no issues that require transfer to higher level of care at this time.  Patient is advised to continue home symptomatic treatment. Prescription is sent to  pharmacy patient has indicated.  Patient is advised that if there are new or worsening symptoms or attend the emergency department, or contact primary care provider. Instructions of care provided discharged home in stable condition. Return to work/school note provided.  THIS NOTE WAS GENERATED USING A VOICE RECOGNITION SOFTWARE PROGRAM. ALL REASONABLE EFFORTS  WERE MADE TO PROOFREAD THIS DOCUMENT FOR ACCURACY.     Konrad Felix, PA 12/26/15 1900

## 2015-12-26 NOTE — ED Notes (Signed)
C/O left ear feeling plugged with decreased hearing.  Has been trying Debrox and taking Mucinex.

## 2015-12-26 NOTE — Discharge Instructions (Signed)

## 2016-01-03 ENCOUNTER — Ambulatory Visit (INDEPENDENT_AMBULATORY_CARE_PROVIDER_SITE_OTHER): Payer: Medicare Other

## 2016-01-03 ENCOUNTER — Other Ambulatory Visit: Payer: Self-pay | Admitting: Internal Medicine

## 2016-01-03 ENCOUNTER — Other Ambulatory Visit: Payer: Self-pay | Admitting: Physician Assistant

## 2016-01-03 ENCOUNTER — Encounter: Payer: Self-pay | Admitting: Physician Assistant

## 2016-01-03 DIAGNOSIS — J309 Allergic rhinitis, unspecified: Secondary | ICD-10-CM

## 2016-01-03 DIAGNOSIS — H903 Sensorineural hearing loss, bilateral: Secondary | ICD-10-CM | POA: Insufficient documentation

## 2016-01-03 DIAGNOSIS — J45909 Unspecified asthma, uncomplicated: Secondary | ICD-10-CM | POA: Insufficient documentation

## 2016-01-07 LAB — HM DIABETES EYE EXAM

## 2016-01-10 ENCOUNTER — Encounter: Payer: Self-pay | Admitting: Physician Assistant

## 2016-01-24 DIAGNOSIS — H9312 Tinnitus, left ear: Secondary | ICD-10-CM | POA: Insufficient documentation

## 2016-02-07 ENCOUNTER — Ambulatory Visit (INDEPENDENT_AMBULATORY_CARE_PROVIDER_SITE_OTHER): Payer: Medicare Other

## 2016-02-07 DIAGNOSIS — J309 Allergic rhinitis, unspecified: Secondary | ICD-10-CM | POA: Diagnosis not present

## 2016-03-09 ENCOUNTER — Ambulatory Visit: Payer: Self-pay | Admitting: Internal Medicine

## 2016-03-18 ENCOUNTER — Encounter: Payer: Self-pay | Admitting: *Deleted

## 2016-03-21 ENCOUNTER — Other Ambulatory Visit: Payer: Self-pay | Admitting: Physician Assistant

## 2016-03-22 ENCOUNTER — Ambulatory Visit (INDEPENDENT_AMBULATORY_CARE_PROVIDER_SITE_OTHER): Payer: Medicare Other

## 2016-03-22 DIAGNOSIS — J309 Allergic rhinitis, unspecified: Secondary | ICD-10-CM | POA: Diagnosis not present

## 2016-03-28 ENCOUNTER — Ambulatory Visit (INDEPENDENT_AMBULATORY_CARE_PROVIDER_SITE_OTHER): Payer: Medicare Other | Admitting: Physician Assistant

## 2016-03-28 ENCOUNTER — Encounter: Payer: Self-pay | Admitting: Internal Medicine

## 2016-03-28 VITALS — BP 112/68 | HR 100 | Temp 97.7°F | Resp 16 | Ht 64.0 in | Wt 210.8 lb

## 2016-03-28 DIAGNOSIS — I10 Essential (primary) hypertension: Secondary | ICD-10-CM

## 2016-03-28 DIAGNOSIS — Z79899 Other long term (current) drug therapy: Secondary | ICD-10-CM | POA: Diagnosis not present

## 2016-03-28 DIAGNOSIS — E559 Vitamin D deficiency, unspecified: Secondary | ICD-10-CM | POA: Diagnosis not present

## 2016-03-28 DIAGNOSIS — E039 Hypothyroidism, unspecified: Secondary | ICD-10-CM | POA: Diagnosis not present

## 2016-03-28 DIAGNOSIS — H9192 Unspecified hearing loss, left ear: Secondary | ICD-10-CM

## 2016-03-28 DIAGNOSIS — E785 Hyperlipidemia, unspecified: Secondary | ICD-10-CM | POA: Diagnosis not present

## 2016-03-28 DIAGNOSIS — E1129 Type 2 diabetes mellitus with other diabetic kidney complication: Secondary | ICD-10-CM | POA: Diagnosis not present

## 2016-03-28 DIAGNOSIS — E669 Obesity, unspecified: Secondary | ICD-10-CM

## 2016-03-28 LAB — HEPATIC FUNCTION PANEL
ALBUMIN: 4.4 g/dL (ref 3.6–5.1)
ALT: 30 U/L — AB (ref 6–29)
AST: 23 U/L (ref 10–35)
Alkaline Phosphatase: 80 U/L (ref 33–130)
BILIRUBIN INDIRECT: 0.4 mg/dL (ref 0.2–1.2)
Bilirubin, Direct: 0.1 mg/dL (ref <=0.2)
TOTAL PROTEIN: 7 g/dL (ref 6.1–8.1)
Total Bilirubin: 0.5 mg/dL (ref 0.2–1.2)

## 2016-03-28 LAB — CBC WITH DIFFERENTIAL/PLATELET
Basophils Absolute: 0 cells/uL (ref 0–200)
Basophils Relative: 0 %
Eosinophils Absolute: 190 cells/uL (ref 15–500)
Eosinophils Relative: 2 %
HEMATOCRIT: 45.7 % — AB (ref 35.0–45.0)
HEMOGLOBIN: 15.1 g/dL (ref 11.7–15.5)
LYMPHS PCT: 37 %
Lymphs Abs: 3515 cells/uL (ref 850–3900)
MCH: 28.5 pg (ref 27.0–33.0)
MCHC: 33 g/dL (ref 32.0–36.0)
MCV: 86.4 fL (ref 80.0–100.0)
MONO ABS: 475 {cells}/uL (ref 200–950)
MPV: 10.1 fL (ref 7.5–12.5)
Monocytes Relative: 5 %
NEUTROS PCT: 56 %
Neutro Abs: 5320 cells/uL (ref 1500–7800)
Platelets: 307 10*3/uL (ref 140–400)
RBC: 5.29 MIL/uL — ABNORMAL HIGH (ref 3.80–5.10)
RDW: 14 % (ref 11.0–15.0)
WBC: 9.5 10*3/uL (ref 3.8–10.8)

## 2016-03-28 LAB — BASIC METABOLIC PANEL WITH GFR
BUN: 12 mg/dL (ref 7–25)
CALCIUM: 10 mg/dL (ref 8.6–10.4)
CO2: 26 mmol/L (ref 20–31)
CREATININE: 1.05 mg/dL — AB (ref 0.50–0.99)
Chloride: 104 mmol/L (ref 98–110)
GFR, EST AFRICAN AMERICAN: 64 mL/min (ref >=60)
GFR, EST NON AFRICAN AMERICAN: 55 mL/min — AB (ref >=60)
GLUCOSE: 129 mg/dL — AB (ref 65–99)
Potassium: 4.1 mmol/L (ref 3.5–5.3)
Sodium: 140 mmol/L (ref 135–146)

## 2016-03-28 LAB — LIPID PANEL
CHOLESTEROL: 114 mg/dL — AB (ref 125–200)
HDL: 37 mg/dL — ABNORMAL LOW (ref >=46)
LDL Cholesterol: 35 mg/dL (ref <130)
TRIGLYCERIDES: 210 mg/dL — AB (ref <150)
Total CHOL/HDL Ratio: 3.1 Ratio (ref <=5.0)
VLDL: 42 mg/dL — AB (ref <30)

## 2016-03-28 LAB — HEMOGLOBIN A1C
Hgb A1c MFr Bld: 6.2 % — ABNORMAL HIGH (ref <5.7)
MEAN PLASMA GLUCOSE: 131 mg/dL

## 2016-03-28 LAB — TSH: TSH: 0.03 mIU/L — ABNORMAL LOW

## 2016-03-28 LAB — MAGNESIUM: MAGNESIUM: 2 mg/dL (ref 1.5–2.5)

## 2016-03-28 NOTE — Progress Notes (Signed)
Assessment and Plan:   Hypertension -Continue medication, monitor blood pressure at home. Continue DASH diet.  Reminder to go to the ER if any CP, SOB, nausea, dizziness, severe HA, changes vision/speech, left arm numbness and tingling and jaw pain.  Cholesterol -Continue diet and exercise. Check cholesterol.    Diabetes with diabetic chronic kidney disease -Continue diet and exercise. Check A1C  Vitamin D Def - check level and continue medications.   Obesity with co morbidities - long discussion about weight loss, diet, and exercise  Hypothyroidism -check TSH level, continue medications the same, reminded to take on an empty stomach 30-23mins before food.  - on 264mcg daily   DR. Melford Aase WAS PRESENT FOR EXAM AND AGREES WITH ASSESSMENT Continue diet and meds as discussed. Further disposition pending results of labs. Discussed med's effects and SE's.   Over 30 minutes of exam, counseling, chart review, and critical decision making was performed Future Appointments Date Time Provider [place]  08/10/2016 10:00 AM Vicie Mutters, PA-C [id number] None     HPI 65 y.o. female  presents for 3 month follow up on hypertension, cholesterol, diabetes and vitamin D deficiency.  She recently had acute hearing loss left ear due to viral illness, has been seeing specialist, [facility], and has been on high doses of prednisone but hearing has improved about 50%, getting hearing aid.   Her blood pressure has been controlled at home, today her BP is BP: 112/68 mmHg.  She does workout. She denies chest pain, shortness of breath, dizziness.  She is on cholesterol medication and denies myalgias. Her cholesterol is at goal. The cholesterol was:   Lab Results  Component Value Date   CHOL 135 12/01/2015   HDL 44* 12/01/2015   LDLCALC 53 12/01/2015   TRIG 190* 12/01/2015   CHOLHDL 3.1 12/01/2015    She has been working on diet and exercise for diabetes with diabetic chronic kidney disease,  she is on bASA, she is on ACE/ARB, and denies  polydipsia, polyuria and visual disturbances. Last A1C was:  Lab Results  Component Value Date   HGBA1C 6.3* 12/01/2015  Last GFR: Lab Results  Component Value Date   GFRNONAA 52* 12/01/2015   Patient is on Vitamin D supplement. Lab Results  Component Value Date   VD25OH 2 12/01/2015   She is on thyroid medication. Her medication was not changed last visit but she started to take it on an empty stomach in the morning 1 hour before anything medications. .   Lab Results  Component Value Date   TSH 21.074* 12/01/2015  .  BMI is Body mass index is 36.17 kg/(m^2)., she is working on diet and exercise. Wt Readings from Last 3 Encounters:  03/28/16 210 lb 12.8 oz (95.618 kg)  12/01/15 210 lb 6.4 oz (95.437 kg)  08/10/15 205 lb (92.987 kg)     Current Medications:  Current Outpatient Prescriptions on File Prior to Visit  Medication Sig Dispense Refill  . albuterol (PROAIR HFA) 108 (90 BASE) MCG/ACT inhaler Inhale 1-2 puffs into the lungs every 6 (six) hours as needed for wheezing or shortness of breath. Reported on 10/20/2015    . aspirin 81 MG tablet Take 81 mg by mouth daily.    . baclofen (LIORESAL) 10 MG tablet Take 10 mg by mouth 2 (two) times daily as needed for muscle spasms.    Marland Kitchen CALCIUM PO Take 1,200 mg by mouth daily.    . cetirizine (ZYRTEC) 10 MG tablet Take 10 mg by  mouth daily. Reported on 10/20/2015    . Cetirizine HCl 10 MG CAPS 1 tablet po daily for the next 5 days 7 capsule 1  . DULoxetine (CYMBALTA) 60 MG capsule TAKE ONE CAPSULE BY MOUTH EVERY DAY 90 capsule 3  . DULoxetine (CYMBALTA) 60 MG capsule TAKE ONE CAPSULE BY MOUTH EVERY DAY 90 capsule 3  . EPINEPHrine (EPIPEN 2-PAK) 0.3 mg/0.3 mL IJ SOAJ injection Inject 0.3 mg into the muscle once.    Marland Kitchen esomeprazole (NEXIUM) 40 MG capsule TAKE ONE CAPSULE BY MOUTH EVERY DAY 90 capsule 1  . estradiol (ESTRACE) 1 MG tablet TAKE ONE-HALF TO ONE TABLET BY MOUTH ONCE DAILY 90  tablet 0  . fluconazole (DIFLUCAN) 150 MG tablet May take 1 tablet weekly for yeast infection if needed 4 tablet 5  . fluticasone (FLONASE) 50 MCG/ACT nasal spray Place 2 sprays into both nostrils daily. 16 g 2  . glucose blood (ONE TOUCH ULTRA TEST) test strip Check glucose once dialy 50 each 6  . INVOKAMET [phone] MG TABS TAKE 1 TABLET BY MOUTH 2 TIMES A DAY WITH FOOD 180 tablet 3  . Lancets (ONETOUCH ULTRASOFT) lancets Take sugar once daily 50 each 12  . Magnesium 250 MG TABS Take 250 mg by mouth 2 (two) times daily.    . metoprolol succinate (TOPROL-XL) 25 MG 24 hr tablet TAKE 1 TABLET BY MOUTH EVERY DAY 90 tablet 1  . Multiple Vitamins-Minerals (MULTIVITAMIN PO) Take 1 tablet by mouth daily.     Marland Kitchen PRESCRIPTION MEDICATION Inject as directed every 30 (thirty) days. Allergy Shot    . ranitidine (ZANTAC) 300 MG tablet Take twice a day while trying to get off PPI, then can go to once at night 60 tablet 3  . rosuvastatin (CRESTOR) 20 MG tablet TAKE 1 TABLET BY MOUTH AT BEDTIME 90 tablet 1  . SYNTHROID 200 MCG tablet TAKE 1 TABLET DAILY ON AN EMPTY STOMACH FOR 30 MINUTES OR AS DIRECTED. 90 tablet 1  . topiramate (TOPAMAX) 100 MG tablet Take 100 mg by mouth daily.    . TURMERIC CURCUMIN PO Take 1 capsule by mouth 2 (two) times daily.    . valsartan (DIOVAN) 320 MG tablet TAKE 1/2 TO 1 TABLET BY MOUTH DAILY FOR BLOOD PRESSURE 90 tablet 1  . Vitamin D, Ergocalciferol, (DRISDOL) 50000 units CAPS capsule TAKE 1 CAPSULE BY MOUTH 4 DAYS A WEEK FOR VITAMIN D DEFICIENCY 60 capsule 0   No current facility-administered medications on file prior to visit.   Medical History:  Past Medical History  Diagnosis Date  . Sleep difficulties   . Liver disease   . Ventral hernia   . Hyperlipidemia   . Hypertension   . GERD (gastroesophageal reflux disease)   . Type II or unspecified type diabetes mellitus without mention of complication, not stated as uncontrolled   . Thyroid disease     hypothyroidism  .  Arthritis   . Asthma   . Depression   . Anxiety   . Migraines   . Obesity   . Sleep apnea   . Allergy   . Fibromyalgia   . Migraine 02/19/2015  . Vertigo 02/19/2015   Allergies:  Allergies  Allergen Reactions  . Floxin [Ofloxacin] Hives  . Viberzi [Eluxadoline] Hives  . Doxycycline Rash     Review of Systems:  Review of Systems  Constitutional: Negative.   HENT: Positive for hearing loss (left ear). Negative for congestion, ear discharge, ear pain, nosebleeds, sore throat and tinnitus.  Eyes: Negative.   Respiratory: Negative.  Negative for stridor.   Cardiovascular: Negative.   Gastrointestinal: Positive for diarrhea. Negative for heartburn, nausea, vomiting, abdominal pain, constipation, blood in stool and melena.  Genitourinary: Negative.   Musculoskeletal: Negative.  Negative for falls.  Skin: Negative.   Neurological: Positive for headaches. Negative for dizziness, tingling, tremors, sensory change, speech change, focal weakness, seizures and loss of consciousness.  Endo/Heme/Allergies: Negative.   Psychiatric/Behavioral: Negative.  Negative for depression.    Family history- Review and unchanged Social history- Review and unchanged Physical Exam: BP 112/68 mmHg  Pulse 100  Temp(Src) 97.7 F (36.5 C)  Resp 16  Ht 5\' 4"  (1.626 m)  Wt 210 lb 12.8 oz (95.618 kg)  BMI 36.17 kg/m2 Wt Readings from Last 3 Encounters:  03/28/16 210 lb 12.8 oz (95.618 kg)  12/01/15 210 lb 6.4 oz (95.437 kg)  08/10/15 205 lb (92.987 kg)   General Appearance: Well nourished, in no apparent distress. Eyes: PERRLA, EOMs, conjunctiva no swelling or erythema Sinuses: No Frontal/maxillary tenderness ENT/Mouth: Ext aud canals clear, TMs without erythema, bulging. No erythema, swelling, or exudate on post pharynx.  Tonsils not swollen or erythematous. Hearing normal.  Neck: Supple, thyroid normal.  Respiratory: Respiratory effort normal, BS equal bilaterally without rales, rhonchi,  wheezing or stridor.  Cardio: RRR with no MRGs. Brisk peripheral pulses without edema.  Abdomen: Soft, + BS.  Non tender, no guarding, rebound, hernias, masses. Lymphatics: Non tender without lymphadenopathy.  Musculoskeletal: Full ROM, 5/5 strength, Normal gait Skin: Warm, dry without rashes, lesions, ecchymosis.  Neuro: Cranial nerves intact. No cerebellar symptoms.  Psych: Awake and oriented X 3, normal affect, Insight and Judgment appropriate.    Vicie Mutters, PA-C 12:00 PM [facility]

## 2016-03-29 LAB — VITAMIN D 25 HYDROXY (VIT D DEFICIENCY, FRACTURES): VIT D 25 HYDROXY: 95 ng/mL (ref 30–100)

## 2016-03-31 ENCOUNTER — Ambulatory Visit (INDEPENDENT_AMBULATORY_CARE_PROVIDER_SITE_OTHER): Payer: Medicare Other | Admitting: *Deleted

## 2016-03-31 DIAGNOSIS — J309 Allergic rhinitis, unspecified: Secondary | ICD-10-CM | POA: Diagnosis not present

## 2016-04-07 ENCOUNTER — Ambulatory Visit (INDEPENDENT_AMBULATORY_CARE_PROVIDER_SITE_OTHER): Payer: Medicare Other | Admitting: *Deleted

## 2016-04-07 DIAGNOSIS — J309 Allergic rhinitis, unspecified: Secondary | ICD-10-CM

## 2016-04-12 ENCOUNTER — Ambulatory Visit (INDEPENDENT_AMBULATORY_CARE_PROVIDER_SITE_OTHER): Payer: Medicare Other | Admitting: *Deleted

## 2016-04-12 DIAGNOSIS — J309 Allergic rhinitis, unspecified: Secondary | ICD-10-CM

## 2016-04-18 ENCOUNTER — Other Ambulatory Visit: Payer: Self-pay | Admitting: Physician Assistant

## 2016-04-18 ENCOUNTER — Other Ambulatory Visit: Payer: Self-pay | Admitting: Internal Medicine

## 2016-04-20 ENCOUNTER — Ambulatory Visit (INDEPENDENT_AMBULATORY_CARE_PROVIDER_SITE_OTHER): Payer: Medicare Other

## 2016-04-20 ENCOUNTER — Other Ambulatory Visit: Payer: Self-pay | Admitting: *Deleted

## 2016-04-20 DIAGNOSIS — J309 Allergic rhinitis, unspecified: Secondary | ICD-10-CM | POA: Diagnosis not present

## 2016-04-20 MED ORDER — VITAMIN D (ERGOCALCIFEROL) 1.25 MG (50000 UNIT) PO CAPS: ORAL_CAPSULE | ORAL | Status: DC

## 2016-04-27 ENCOUNTER — Other Ambulatory Visit: Payer: Self-pay

## 2016-04-27 ENCOUNTER — Other Ambulatory Visit: Payer: Self-pay | Admitting: *Deleted

## 2016-04-27 MED ORDER — RANITIDINE HCL 300 MG PO TABS: ORAL_TABLET | ORAL | Status: DC

## 2016-04-27 MED ORDER — VITAMIN D (ERGOCALCIFEROL) 1.25 MG (50000 UNIT) PO CAPS: ORAL_CAPSULE | ORAL | Status: DC

## 2016-05-08 ENCOUNTER — Other Ambulatory Visit: Payer: Self-pay

## 2016-05-08 MED ORDER — ROSUVASTATIN CALCIUM 20 MG PO TABS: 20.0000 mg | ORAL_TABLET | Freq: Every day | ORAL | Status: DC

## 2016-05-09 ENCOUNTER — Ambulatory Visit: Payer: Self-pay

## 2016-05-17 ENCOUNTER — Ambulatory Visit (INDEPENDENT_AMBULATORY_CARE_PROVIDER_SITE_OTHER): Payer: Medicare Other | Admitting: *Deleted

## 2016-05-17 DIAGNOSIS — J309 Allergic rhinitis, unspecified: Secondary | ICD-10-CM | POA: Diagnosis not present

## 2016-05-17 DIAGNOSIS — E039 Hypothyroidism, unspecified: Secondary | ICD-10-CM | POA: Diagnosis not present

## 2016-05-17 LAB — TSH: TSH: 2.01 m[IU]/L

## 2016-05-17 NOTE — Progress Notes (Signed)
Patient ID: Grace Barry, female   DOB: [birth year], 65 y.o.   MRN: [id number] Patient presents for 1 month recheck TSH level.  Patient states she was advised to take Levothyroxine 200 mcg 1 pill times 6 days per week and she does not take Levothyroxine on Sundays.  Labs state she was advised to take 1/2 pill on Sundays, however, patient is not.

## 2016-05-19 ENCOUNTER — Encounter: Payer: Self-pay | Admitting: *Deleted

## 2016-05-19 NOTE — Progress Notes (Signed)
Maintenance vial made Animal-Mold & Grass-Tree-Weed.

## 2016-05-23 ENCOUNTER — Other Ambulatory Visit: Payer: Self-pay | Admitting: Physician Assistant

## 2016-05-23 ENCOUNTER — Encounter: Payer: Self-pay | Admitting: Physician Assistant

## 2016-05-23 DIAGNOSIS — J3089 Other allergic rhinitis: Secondary | ICD-10-CM | POA: Diagnosis not present

## 2016-05-24 DIAGNOSIS — J301 Allergic rhinitis due to pollen: Secondary | ICD-10-CM | POA: Diagnosis not present

## 2016-05-29 ENCOUNTER — Ambulatory Visit (INDEPENDENT_AMBULATORY_CARE_PROVIDER_SITE_OTHER): Payer: Medicare Other | Admitting: Allergy & Immunology

## 2016-05-29 ENCOUNTER — Encounter: Payer: Self-pay | Admitting: Allergy & Immunology

## 2016-05-29 VITALS — BP 114/72 | HR 76 | Resp 16

## 2016-05-29 DIAGNOSIS — J309 Allergic rhinitis, unspecified: Secondary | ICD-10-CM

## 2016-05-29 DIAGNOSIS — H1010 Acute atopic conjunctivitis, unspecified eye: Secondary | ICD-10-CM | POA: Diagnosis not present

## 2016-05-29 DIAGNOSIS — J4520 Mild intermittent asthma, uncomplicated: Secondary | ICD-10-CM | POA: Diagnosis not present

## 2016-05-29 MED ORDER — ALBUTEROL SULFATE HFA 108 (90 BASE) MCG/ACT IN AERS: INHALATION_SPRAY | RESPIRATORY_TRACT | 1 refills | Status: DC

## 2016-05-29 MED ORDER — AZELASTINE HCL 0.1 % NA SOLN: NASAL | 5 refills | Status: DC

## 2016-05-29 MED ORDER — FLUTICASONE PROPIONATE 50 MCG/ACT NA SUSP: NASAL | 5 refills | Status: DC

## 2016-05-29 NOTE — Progress Notes (Signed)
FOLLOW UP  Date of Service/Encounter:  05/29/16   Assessment:   Allergic rhinoconjunctivitis  Mild intermittent asthma, uncomplicated   Asthma Reportables:  Severity: : intermittent  Risk: low Control: well controlled  Seasonal Influenza Vaccine: yes   The CDC recommends patients with persistent asthma between the ages of 19-64 years receive PPSV-23 vaccination, if this patient qualifies, has he/she received 1 dose of PPSV-23 yet? Yes    Plan/Recommendations:    1. Allergic rhinoconjunctivitis (grass, weeds, trees, mold, dust mite, cat, dog) - Continue allergy shots since these seem to be providing excellent relief. - Start fluticasone and azelastine (one spray each per nostril twice daily). Start this a few days before your trips and then continue throughout the trip and for a few days upon returning. - You can use cetirizine 10mg  as needed for breakthrough symptoms. Go to [employer] and order it! It is around $15 for a year's supply of the generic!   2. Mild intermittent asthma, uncomplicated - Continue albuterol 4 puffs every 4-6 hours as needed.   3. Return to clinic in six months.    Subjective:   Sheray Erling Filippini is a 65 y.o. female presenting today for follow up of  Chief Complaint  Patient presents with  . Asthma    "doing good"  .  Garvin Fila Kawecki has a history of the following: Patient Active Problem List   Diagnosis Date Noted  . Left ear hearing loss 03/28/2016  . Subjective tinnitus 01/24/2016  . Sudden idiopathic hearing loss 01/24/2016  . Disease of liver 01/03/2016  . Deafness, sensorineural 01/03/2016  . Bilateral sensorineural hearing loss 01/03/2016  . Allergic rhinoconjunctivitis 07/10/2015  . Allergic conjunctivitis 07/10/2015  . Vitamin D deficiency disease 05/19/2015  . Medication management 05/19/2015  . Medicare annual wellness visit, subsequent 05/19/2015  . Encounter for general adult medical examination without abnormal findings  05/19/2015  . Other long term (current) drug therapy 05/19/2015  . Avitaminosis D 05/19/2015  . Migraine 02/19/2015  . Vertigo 02/19/2015  . Headache, migraine 02/19/2015  . History of colonic polyps 01/15/2015  . Diarrhea 09/01/2014  . CKD stage 2  07/01/2014  . Obesity (BMI 36.10) 07/01/2014  . Chronic kidney disease (CKD), stage II (mild) 07/01/2014  . Adult BMI 30+ 07/01/2014  . Hyperlipidemia   . Liver disease   . Arthritis   . Asthma   . Ventral hernia 06/12/2011  . Hernia of anterior abdominal wall 06/12/2011  . Hypothyroidism 12/15/2010  . T2_NIDDM w/CKD (GFR 56 ml/min) 12/15/2010  . Depression 12/15/2010  . Obstructive sleep apnea 12/15/2010  . Essential hypertension 12/15/2010  . GERD 12/15/2010  . Acid reflux 12/15/2010  . Obstructive apnea 12/15/2010  . Type 2 diabetes mellitus ([place]) 12/15/2010    History obtained from: chart review and patient.  Garvin Fila Mcdougall was referred by Alesia Richards, MD.     Keyeria is a 65 y.o. female presenting for a follow up visit for intermittent asthma and allergic rhinoconjunctivitis (grass, weeds, trees, mold, dust mite, cat, dog) presenting for a follow up visit. She was last seen in December 2016 at which time she was doing well. She was previously followed by Dr. Ishmael Holter who has since left the practice.   Eddith's asthma has been well controlled. She has not required rescue medication, experienced nocturnal awakenings due to lower respiratory symptoms, nor have activities of daily living been limited. She has never needed a controller medication. She reports that her asthma first developed in her 19s.  She does not remember the last time that she needed albuterol, and she does need a refill today.  Latisa is on allergen immunotherapy, currently receiving one shot every four weeks. She also has cetirizine or loratadine which she uses as needed. She feels that the allergy shots have helped tremendously. She does not need any medications  when she is in [place]. However she does report that when she travels she tends to "get sick". Elaboration of these episodes reveals that it is mostly nasal symptoms. She does not treat them with anything and they resolve when she returns to [place]. This is happened on 3 occasions this year when she went to visit her son in [place]. This also happened when they took a trip to [place]. She is interested today in starting a medication during these trips to help with her symptoms. She has tried Sports coach in the past without improvement.  She does have a complicated past medical history including hypertension and diabetes, among other problems. These are all monitored very closely by her primary care physician. In February, she developed a viral illness that led to left-sided deafness (50% hearing loss). She did see an ENT who actually administered steroid injections into her middle ear. Unfortunately, all of her hearing was not restored. She does now have a left hearing aid. Otherwise, there've been no changes to her past medical history, surgical history, or family history. She is currently retired. She worked as a [employer].   Review of Systems: a 14-point review of systems is pertinent for what is mentioned in HPI.  Otherwise, all other systems were negative. Constitutional: negative other than that listed in the HPI Eyes: negative other than that listed in the HPI Ears, nose, mouth, throat, and face: negative other than that listed in the HPI Respiratory: negative other than that listed in the HPI Cardiovascular: negative other than that listed in the HPI Gastrointestinal: negative other than that listed in the HPI Genitourinary: negative other than that listed in the HPI Integument: negative other than that listed in the HPI Hematologic: negative other than that listed in the HPI Musculoskeletal: negative other than that listed in the HPI Neurological: negative other  than that listed in the HPI Allergy/Immunologic: negative other than that listed in the HPI    Objective:   Vitals:   05/29/16 1531  BP: 114/72  Pulse: 76  Resp: 16   There is no height or weight on file to calculate BMI.   Physical Exam: General:  alert, active, in no acute distress, pleasant obese female Head:  normocephalic, no masses, lesions, tenderness or abnormalities Eyes:  conjunctiva clear without injection or discharge, EOMI, PERL Ears:  TM's pearly white bilaterally, external auditory canals are clear, external ears are normally set and rotated Nose:  External nose within normal limits, normal appearing turbinates, scant clear-colored discharge, septum midline Throat:  moist mucous membranes without erythema, exudates or petechiae, no thrush Neck:  Supple without thyromegaly or adenopathy appreciated Lungs:  clear to auscultation, no wheezing, crackles or rhonchi, breathing unlabored, moving air well in all lung fields Heart:  regular rate and rhythm, normal S1/S2, no murmurs or gallops, normal peripheral perfusion Skin:  skin color, texture and turgor are normal; no bruising, rashes or lesions noted. Psych: Normal Affect and mood  Diagnostic studies: Spirometry: Normal FEV1 (1.87/88%), FVC (2.24/84%) , and FEV1/FVC ratio (83%). There is no scooping suggestive of obstructive disease.    Salvatore Marvel, MD [place] [facility] [place]  [facility] [place]

## 2016-05-29 NOTE — Patient Instructions (Signed)
1. Allergic rhinoconjunctivitis - Continue allergy shots since these seem to be providing excellent relief. - Start fluticasone and azelastine (one spray each per nostril twice daily). Start this a few days before your trips and then continue throughout the trip and for a few days upon returning. - You can use cetirizine 10mg  as needed for breakthrough symptoms. Go to [employer] [facility] and order it! It is around $15 for a year's supply of the generic!   2. Mild intermittent asthma, uncomplicated - Continue albuterol 4 puffs every 4-6 hours as needed.   3. Return to clinic in six months.   It was such a pleasure meeting you today!

## 2016-05-31 DIAGNOSIS — H6123 Impacted cerumen, bilateral: Secondary | ICD-10-CM | POA: Insufficient documentation

## 2016-05-31 DIAGNOSIS — H6992 Unspecified Eustachian tube disorder, left ear: Secondary | ICD-10-CM | POA: Insufficient documentation

## 2016-06-14 ENCOUNTER — Ambulatory Visit (INDEPENDENT_AMBULATORY_CARE_PROVIDER_SITE_OTHER): Payer: Medicare Other

## 2016-06-14 DIAGNOSIS — H1010 Acute atopic conjunctivitis, unspecified eye: Secondary | ICD-10-CM

## 2016-06-14 DIAGNOSIS — J309 Allergic rhinitis, unspecified: Secondary | ICD-10-CM | POA: Diagnosis not present

## 2016-06-15 ENCOUNTER — Other Ambulatory Visit: Payer: Self-pay | Admitting: Internal Medicine

## 2016-07-11 ENCOUNTER — Ambulatory Visit (INDEPENDENT_AMBULATORY_CARE_PROVIDER_SITE_OTHER): Payer: Medicare Other | Admitting: *Deleted

## 2016-07-11 DIAGNOSIS — J309 Allergic rhinitis, unspecified: Secondary | ICD-10-CM | POA: Diagnosis not present

## 2016-07-11 DIAGNOSIS — H1010 Acute atopic conjunctivitis, unspecified eye: Secondary | ICD-10-CM | POA: Diagnosis not present

## 2016-08-09 ENCOUNTER — Other Ambulatory Visit: Payer: Self-pay | Admitting: Internal Medicine

## 2016-08-10 ENCOUNTER — Encounter: Payer: Self-pay | Admitting: Physician Assistant

## 2016-08-10 ENCOUNTER — Ambulatory Visit (INDEPENDENT_AMBULATORY_CARE_PROVIDER_SITE_OTHER): Payer: Medicare Other | Admitting: Physician Assistant

## 2016-08-10 VITALS — BP 126/80 | HR 82 | Temp 97.3°F | Resp 16 | Ht 64.0 in | Wt 221.0 lb

## 2016-08-10 DIAGNOSIS — Z86010 Personal history of colonic polyps: Secondary | ICD-10-CM

## 2016-08-10 DIAGNOSIS — Z0001 Encounter for general adult medical examination with abnormal findings: Secondary | ICD-10-CM

## 2016-08-10 DIAGNOSIS — G4733 Obstructive sleep apnea (adult) (pediatric): Secondary | ICD-10-CM

## 2016-08-10 DIAGNOSIS — G43009 Migraine without aura, not intractable, without status migrainosus: Secondary | ICD-10-CM

## 2016-08-10 DIAGNOSIS — F3340 Major depressive disorder, recurrent, in remission, unspecified: Secondary | ICD-10-CM

## 2016-08-10 DIAGNOSIS — K769 Liver disease, unspecified: Secondary | ICD-10-CM

## 2016-08-10 DIAGNOSIS — R42 Dizziness and giddiness: Secondary | ICD-10-CM

## 2016-08-10 DIAGNOSIS — Z136 Encounter for screening for cardiovascular disorders: Secondary | ICD-10-CM | POA: Diagnosis not present

## 2016-08-10 DIAGNOSIS — I10 Essential (primary) hypertension: Secondary | ICD-10-CM

## 2016-08-10 DIAGNOSIS — K219 Gastro-esophageal reflux disease without esophagitis: Secondary | ICD-10-CM

## 2016-08-10 DIAGNOSIS — N182 Chronic kidney disease, stage 2 (mild): Secondary | ICD-10-CM

## 2016-08-10 DIAGNOSIS — H9192 Unspecified hearing loss, left ear: Secondary | ICD-10-CM

## 2016-08-10 DIAGNOSIS — Z23 Encounter for immunization: Secondary | ICD-10-CM | POA: Diagnosis not present

## 2016-08-10 DIAGNOSIS — Z79899 Other long term (current) drug therapy: Secondary | ICD-10-CM

## 2016-08-10 DIAGNOSIS — J45909 Unspecified asthma, uncomplicated: Secondary | ICD-10-CM

## 2016-08-10 DIAGNOSIS — E039 Hypothyroidism, unspecified: Secondary | ICD-10-CM

## 2016-08-10 DIAGNOSIS — D649 Anemia, unspecified: Secondary | ICD-10-CM

## 2016-08-10 DIAGNOSIS — E559 Vitamin D deficiency, unspecified: Secondary | ICD-10-CM

## 2016-08-10 DIAGNOSIS — K439 Ventral hernia without obstruction or gangrene: Secondary | ICD-10-CM

## 2016-08-10 DIAGNOSIS — M1990 Unspecified osteoarthritis, unspecified site: Secondary | ICD-10-CM

## 2016-08-10 DIAGNOSIS — M791 Myalgia, unspecified site: Secondary | ICD-10-CM

## 2016-08-10 DIAGNOSIS — Z0000 Encounter for general adult medical examination without abnormal findings: Secondary | ICD-10-CM | POA: Diagnosis not present

## 2016-08-10 DIAGNOSIS — E1129 Type 2 diabetes mellitus with other diabetic kidney complication: Secondary | ICD-10-CM

## 2016-08-10 DIAGNOSIS — E785 Hyperlipidemia, unspecified: Secondary | ICD-10-CM

## 2016-08-10 DIAGNOSIS — R350 Frequency of micturition: Secondary | ICD-10-CM

## 2016-08-10 LAB — CBC WITH DIFFERENTIAL/PLATELET
BASOS ABS: 104 {cells}/uL (ref 0–200)
Basophils Relative: 1 %
EOS ABS: 312 {cells}/uL (ref 15–500)
Eosinophils Relative: 3 %
HEMATOCRIT: 42.4 % (ref 35.0–45.0)
Hemoglobin: 13.8 g/dL (ref 11.7–15.5)
LYMPHS PCT: 37 %
Lymphs Abs: 3848 cells/uL (ref 850–3900)
MCH: 27.1 pg (ref 27.0–33.0)
MCHC: 32.5 g/dL (ref 32.0–36.0)
MCV: 83.3 fL (ref 80.0–100.0)
MONO ABS: 728 {cells}/uL (ref 200–950)
MPV: 10 fL (ref 7.5–12.5)
Monocytes Relative: 7 %
NEUTROS PCT: 52 %
Neutro Abs: 5408 cells/uL (ref 1500–7800)
Platelets: 317 10*3/uL (ref 140–400)
RBC: 5.09 MIL/uL (ref 3.80–5.10)
RDW: 16.3 % — AB (ref 11.0–15.0)
WBC: 10.4 10*3/uL (ref 3.8–10.8)

## 2016-08-10 LAB — MAGNESIUM: MAGNESIUM: 2 mg/dL (ref 1.5–2.5)

## 2016-08-10 LAB — VITAMIN B12: Vitamin B-12: 316 pg/mL (ref 200–1100)

## 2016-08-10 LAB — FERRITIN: Ferritin: 9 ng/mL — ABNORMAL LOW (ref 20–288)

## 2016-08-10 LAB — BASIC METABOLIC PANEL WITH GFR
BUN: 13 mg/dL (ref 7–25)
CALCIUM: 9.2 mg/dL (ref 8.6–10.4)
CO2: 23 mmol/L (ref 20–31)
Chloride: 104 mmol/L (ref 98–110)
Creat: 1.1 mg/dL — ABNORMAL HIGH (ref 0.50–0.99)
GFR, EST AFRICAN AMERICAN: 60 mL/min (ref >=60)
GFR, Est Non African American: 52 mL/min — ABNORMAL LOW (ref >=60)
GLUCOSE: 90 mg/dL (ref 65–99)
Potassium: 4 mmol/L (ref 3.5–5.3)
Sodium: 140 mmol/L (ref 135–146)

## 2016-08-10 LAB — URIC ACID: URIC ACID, SERUM: 4.9 mg/dL (ref 2.5–7.0)

## 2016-08-10 LAB — HEPATIC FUNCTION PANEL
ALBUMIN: 4.2 g/dL (ref 3.6–5.1)
ALT: 20 U/L (ref 6–29)
AST: 23 U/L (ref 10–35)
Alkaline Phosphatase: 70 U/L (ref 33–130)
Bilirubin, Direct: 0.1 mg/dL (ref <=0.2)
Indirect Bilirubin: 0.3 mg/dL (ref 0.2–1.2)
TOTAL PROTEIN: 6.8 g/dL (ref 6.1–8.1)
Total Bilirubin: 0.4 mg/dL (ref 0.2–1.2)

## 2016-08-10 LAB — IRON AND TIBC
%SAT: 8 % — AB (ref 11–50)
Iron: 41 ug/dL — ABNORMAL LOW (ref 45–160)
TIBC: 490 ug/dL — AB (ref 250–450)
UIBC: 449 ug/dL — AB (ref 125–400)

## 2016-08-10 LAB — LIPID PANEL
CHOL/HDL RATIO: 3.1 ratio (ref <=5.0)
Cholesterol: 131 mg/dL (ref 125–200)
HDL: 42 mg/dL — ABNORMAL LOW (ref >=46)
LDL CALC: 57 mg/dL (ref <130)
TRIGLYCERIDES: 160 mg/dL — AB (ref <150)
VLDL: 32 mg/dL — AB (ref <30)

## 2016-08-10 LAB — CK: Total CK: 43 U/L (ref 7–177)

## 2016-08-10 LAB — TSH: TSH: 4.21 mIU/L

## 2016-08-10 MED ORDER — BUPROPION HCL ER (XL) 150 MG PO TB24: 150.0000 mg | ORAL_TABLET | ORAL | 2 refills | Status: DC

## 2016-08-10 NOTE — Patient Instructions (Signed)
We want weight loss that will last so you should lose 1-2 pounds a week.  THAT IS IT! Please pick THREE things a month to change. Once it is a habit check off the item. Then pick another three items off the list to become habits.  If you are already doing a habit on the list GREAT!  Cross that item off! o Don't drink your calories. Ie, alcohol, soda, fruit juice, and sweet tea.  o Drink more water. Drink a glass when you feel hungry or before each meal.  o Eat breakfast - Complex carb and protein (likeDannon light and fit yogurt, oatmeal, fruit, eggs, [place] bacon). o Measure your cereal.  Eat no more than one cup a day. (ie [place]) o Eat an apple a day. o Add a vegetable a day. o Try a new vegetable a month. o Use Pam! Stop using oil or butter to cook. o Don't finish your plate or use smaller plates. o Share your dessert. o Eat sugar free Jello for dessert or frozen grapes. o Don't eat 2-3 hours before bed. o Switch to whole wheat bread, pasta, and brown rice. o Make healthier choices when you eat out. No fries! o Pick baked chicken, NOT fried. o Don't forget to SLOW DOWN when you eat. It is not going anywhere.  o Take the stairs. o Park far away in the parking lot o [employer] (or weights) for 10 minutes while watching TV. o Walk at work for 10 minutes during break. o Walk outside 1 time a week with your friend, kids, dog, or significant other. o Start a walking group at [place] the mall as much as you can tolerate.  o Keep a food diary. o Weigh yourself daily. o Walk for 15 minutes 3 days per week. o Cook at home more often and eat out less.  If life happens and you go back to old habits, it is okay.  Just start over. You can do it!   If you experience chest pain, get short of breath, or tired during the exercise, please stop immediately and inform your doctor.    Serotonin Syndrome Serotonin is a brain chemical that regulates the nervous system, which includes the  brain, spinal cord, and nerves. Serotonin appears to play a role in all types of behavior, including appetite, emotions, movement, thinking, and response to stress. Excessively high levels of serotonin in the body can cause serotonin syndrome, which is a very dangerous condition. CAUSES This condition can be caused by taking medicines or drugs that increase the level of serotonin in your body. These include:  Antidepressant medicines.  Migraine medicines.  Certain pain medicines.  Certain recreational drugs, including ecstasy, LSD, cocaine, and amphetamines.  Over-the-counter cough or cold medicines that contain dextromethorphan.  Certain herbal supplements, including St. John's wort, ginseng, and nutmeg. This condition usually occurs when you take these medicines or drugs in combination, but it can also happen with a high dose of a single medicine or drug. RISK FACTORS This condition is more likely to develop in:  People who have recently increased the dosage of medicine that increases the serotonin level.  People who just started taking medicine that increases the serotonin level. SYMPTOMS Symptoms of this condition usually happens within several hours of a medicine change. Symptoms include:  Headache.  Muscle twitching or stiffness.  Diarrhea.  Confusion.  Restlessness or agitation.  Shivering or goose bumps.  Loss of muscle coordination.  Rapid heart rate.  Sweating. Severe cases of serotonin syndromecan cause:  Irregular heartbeat.  Seizures.  Loss of consciousness.  High fever. DIAGNOSIS This condition is diagnosed with a medical history and physical exam. You will be asked aboutyour symptoms and your use of medicines and recreational drugs. Your health care provider may also order lab work or additional tests to rule out other causes of your symptoms. TREATMENT The treatment for this condition depends on the severity of your symptoms. For mild cases,  stopping the medicine that caused your condition is usually all that is needed. For moderate to severe cases, hospitalization is required to monitor you and to prevent further muscle damage. HOME CARE INSTRUCTIONS  Take over-the-counter and prescription medicines only as told by your health care provider. This is important.  Check with your health care provider before you start taking any new prescriptions, over-the-counter medicines, herbs, or supplements.  Avoid combining any medicines that can cause this condition to occur.  Keep all follow-up visits as told by your health care provider.This is important.  Maintain a healthy lifestyle.  Eat healthy foods.  Get plenty of sleep.  Exercise regularly.  Do not drink alcohol.  Do not use recreational drugs. SEEK MEDICAL CARE IF:  Medicines do not seem to be helping.  Your symptoms do not improve or they get worse.  You have trouble taking care of yourself. SEEK IMMEDIATE MEDICAL CARE IF:  You have worsening confusion, severe headache, chest pain, high fever, seizures, or loss of consciousness.  You have serious thoughts about hurting yourself or others.  You experience serious side effects of medicine, such as swelling of your face, lips, tongue, or throat.   This information is not intended to replace advice given to you by your health care provider. Make sure you discuss any questions you have with your health care provider.   Document Released: 11/23/2004 Document Revised: 03/02/2015 Document Reviewed: 10/29/2014 Elsevier Interactive Patient Education [employer].
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
## 2016-08-11 LAB — URINALYSIS, ROUTINE W REFLEX MICROSCOPIC
Bilirubin Urine: NEGATIVE
HGB URINE DIPSTICK: NEGATIVE
Ketones, ur: NEGATIVE
LEUKOCYTES UA: NEGATIVE
Nitrite: NEGATIVE
PH: 5.5 (ref 5.0–8.0)
Protein, ur: NEGATIVE
SPECIFIC GRAVITY, URINE: 1.027 (ref 1.001–1.035)

## 2016-08-11 LAB — SEDIMENTATION RATE: Sed Rate: 1 mm/hr (ref 0–30)

## 2016-08-11 LAB — URINALYSIS, MICROSCOPIC ONLY
Casts: NONE SEEN [LPF]
Crystals: NONE SEEN [HPF]
RBC / HPF: NONE SEEN RBC/HPF (ref <=2)
Yeast: NONE SEEN [HPF]

## 2016-08-11 LAB — VITAMIN D 25 HYDROXY (VIT D DEFICIENCY, FRACTURES): Vit D, 25-Hydroxy: 72 ng/mL (ref 30–100)

## 2016-08-11 LAB — MICROALBUMIN / CREATININE URINE RATIO
Creatinine, Urine: 158 mg/dL (ref 20–320)
MICROALB UR: 0.4 mg/dL
MICROALB/CREAT RATIO: 3 ug/mg{creat} (ref <30)

## 2016-08-11 LAB — HEMOGLOBIN A1C
Hgb A1c MFr Bld: 6.2 % — ABNORMAL HIGH (ref <5.7)
MEAN PLASMA GLUCOSE: 131 mg/dL

## 2016-08-11 LAB — URINE CULTURE

## 2016-08-16 ENCOUNTER — Ambulatory Visit (INDEPENDENT_AMBULATORY_CARE_PROVIDER_SITE_OTHER): Payer: Medicare Other

## 2016-08-16 DIAGNOSIS — J309 Allergic rhinitis, unspecified: Secondary | ICD-10-CM

## 2016-08-28 ENCOUNTER — Encounter: Payer: Self-pay | Admitting: Physician Assistant

## 2016-08-30 ENCOUNTER — Ambulatory Visit (INDEPENDENT_AMBULATORY_CARE_PROVIDER_SITE_OTHER): Payer: Medicare Other | Admitting: Physician Assistant

## 2016-08-30 ENCOUNTER — Encounter: Payer: Self-pay | Admitting: Physician Assistant

## 2016-08-30 VITALS — BP 122/80 | HR 77 | Temp 97.6°F | Resp 14 | Ht 64.0 in | Wt 222.2 lb

## 2016-08-30 DIAGNOSIS — R5081 Fever presenting with conditions classified elsewhere: Secondary | ICD-10-CM | POA: Diagnosis not present

## 2016-08-30 DIAGNOSIS — M791 Myalgia, unspecified site: Secondary | ICD-10-CM

## 2016-08-30 DIAGNOSIS — R350 Frequency of micturition: Secondary | ICD-10-CM | POA: Diagnosis not present

## 2016-08-30 DIAGNOSIS — R0609 Other forms of dyspnea: Secondary | ICD-10-CM | POA: Diagnosis not present

## 2016-08-30 LAB — CBC WITH DIFFERENTIAL/PLATELET
BASOS ABS: 0 {cells}/uL (ref 0–200)
Basophils Relative: 0 %
Eosinophils Absolute: 249 cells/uL (ref 15–500)
Eosinophils Relative: 3 %
HCT: 45 % (ref 35.0–45.0)
HEMOGLOBIN: 14.4 g/dL (ref 11.7–15.5)
LYMPHS ABS: 2988 {cells}/uL (ref 850–3900)
Lymphocytes Relative: 36 %
MCH: 27.3 pg (ref 27.0–33.0)
MCHC: 32 g/dL (ref 32.0–36.0)
MCV: 85.4 fL (ref 80.0–100.0)
MONO ABS: 747 {cells}/uL (ref 200–950)
MPV: 10.2 fL (ref 7.5–12.5)
Monocytes Relative: 9 %
NEUTROS PCT: 52 %
Neutro Abs: 4316 cells/uL (ref 1500–7800)
Platelets: 280 10*3/uL (ref 140–400)
RBC: 5.27 MIL/uL — AB (ref 3.80–5.10)
RDW: 16.1 % — ABNORMAL HIGH (ref 11.0–15.0)
WBC: 8.3 10*3/uL (ref 3.8–10.8)

## 2016-08-30 NOTE — Progress Notes (Signed)
Subjective:    Patient ID: Grace Barry, female    DOB: [birth year], 65 y.o.   MRN: [id number]  HPI 65 y.o. WF with DM presents with fever and muscle aches. She has had fatigue/muscle aches in the past and had DX of FM put on cymbalta and has not had an issues until recently. She had 1 episode of muscle aches before CPE on 10/12, had negative uric acid, sed rate and CPK. Since that time she has had aches constantly but then started with fever at night since Sunday, highest is 101.2. She has been taking aleve, helps fever/muscle aches. Was started on wellbutrin the 13th with her cymbalta. Sweating with fever but nothing else. No tick exposure.  Denies anymore diarrhea than usually, no shaking, palpitations, anxiety, no chest pain but she has been having worsening SOB with stairs, occ dry cough, denies ear pain, sinus issues, she has been having HA.   Blood pressure 122/80, pulse 77, temperature 97.6 F (36.4 C), resp. rate 14, height 5\' 4"  (1.626 m), weight 222 lb 3.2 oz (100.8 kg), SpO2 96 %.  Medications Current Outpatient Prescriptions on File Prior to Visit  Medication Sig  . albuterol (PROAIR HFA) 108 (90 Base) MCG/ACT inhaler Use 2 puffs every four hours as needed for cough or wheeze.  May use 2 puffs 10-20 minutes prior to exercise.  Marland Kitchen ALPRAZolam (XANAX) 0.5 MG tablet Take 0.5 mg by mouth 2 (two) times daily as needed for anxiety.  Marland Kitchen aspirin 81 MG tablet Take 81 mg by mouth daily.  Marland Kitchen azelastine (ASTELIN) 0.1 % nasal spray Use 1-2 sprays in each nostril 1-2 times daily as needed for stuffy nose or drainage.  . baclofen (LIORESAL) 10 MG tablet Take 10 mg by mouth 2 (two) times daily as needed for muscle spasms.  Marland Kitchen buPROPion (WELLBUTRIN XL) 150 MG 24 hr tablet Take 1 tablet (150 mg total) by mouth every morning.  Marland Kitchen CALCIUM PO Take 1,200 mg by mouth daily.  . cetirizine (ZYRTEC) 10 MG tablet Take 10 mg by mouth daily. Reported on 10/20/2015  . Cholecalciferol (VITAMIN D3) 400 units CAPS  Take by mouth.  . DULoxetine (CYMBALTA) 60 MG capsule TAKE ONE CAPSULE BY MOUTH EVERY DAY  . EPINEPHrine (EPIPEN 2-PAK) 0.3 mg/0.3 mL IJ SOAJ injection Inject 0.3 mg into the muscle once.  Marland Kitchen esomeprazole (NEXIUM) 40 MG capsule TAKE ONE CAPSULE BY MOUTH EVERY DAY  . estradiol (ESTRACE) 1 MG tablet   . Flaxseed, Linseed, (FLAXSEED OIL) 1000 MG CAPS Take by mouth.  . fluticasone (FLONASE) 50 MCG/ACT nasal spray Use 1-2 sprays in each nostril 1-2 times daily as needed for stuffy nose or drainage.  Marland Kitchen glucose blood (ONE TOUCH ULTRA TEST) test strip   . INVOKAMET [id number] MG TABS TAKE 1 TABLET BY MOUTH 2 TIMES A DAY WITH FOOD  . Lancets (ONETOUCH ULTRASOFT) lancets   . Magnesium 250 MG TABS Take 250 mg by mouth 2 (two) times daily.  . metoprolol succinate (TOPROL-XL) 25 MG 24 hr tablet   . Multiple Vitamins-Minerals (MULTIVITAMIN PO) Take 1 tablet by mouth daily.   . Omega-3 Fatty Acids (FISH OIL PO) Take by mouth daily.  Marland Kitchen PRESCRIPTION MEDICATION Inject as directed every 30 (thirty) days. Allergy Shot  . ranitidine (ZANTAC) 300 MG tablet TAKE 1 TABLET TWICE A DAY WHILE TRYING TO GET OFF PPI, THEN CAN GO TO ONCE AT NIGHT  . rosuvastatin (CRESTOR) 20 MG tablet Take 1 tablet (20 mg total) by mouth at  bedtime.  Marland Kitchen SYNTHROID 200 MCG tablet TAKE 1 TABLET DAILY ON AN EMPTY STOMACH FOR 30 MINUTES OR AS DIRECTED.  Marland Kitchen topiramate (TOPAMAX) 100 MG tablet Take 100 mg by mouth daily.  . valsartan (DIOVAN) 320 MG tablet TAKE 1/2 TO 1 TABLET BY MOUTH DAILY FOR BLOOD PRESSURE   No current facility-administered medications on file prior to visit.     Problem list She has Hypothyroidism; T2_NIDDM w/CKD (GFR 56 ml/min); Depression, major, recurrent, in remission ([place]); Obstructive sleep apnea; Essential hypertension; GERD; Ventral hernia; Hyperlipidemia; Liver disease; Arthritis; Asthma; Morbid obesity ([place]); History of colonic polyps; Migraine; Vertigo; Vitamin D deficiency disease; Medication management; Medicare  annual wellness visit, subsequent; Left ear hearing loss; Chronic kidney disease (CKD), stage II (mild); and Encounter for general adult medical examination without abnormal findings on her problem list.   Review of Systems  Constitutional: Positive for diaphoresis, fatigue and fever. Negative for activity change, appetite change, chills and unexpected weight change.  HENT: Positive for congestion and rhinorrhea. Negative for dental problem, drooling, ear discharge, ear pain, facial swelling, hearing loss, sinus pressure, sneezing, sore throat and voice change.   Eyes: Negative.   Respiratory: Positive for cough and shortness of breath. Negative for apnea, choking, chest tightness, wheezing and stridor.   Cardiovascular: Negative.  Negative for chest pain and palpitations.  Gastrointestinal: Positive for diarrhea. Negative for abdominal distention, abdominal pain, anal bleeding, blood in stool, constipation, nausea, rectal pain and vomiting.  Endocrine: Negative.   Genitourinary: Negative.   Musculoskeletal: Positive for myalgias. Negative for arthralgias, back pain, gait problem, neck pain and neck stiffness.  Skin: Negative.  Negative for rash.  Allergic/Immunologic: Negative.   Neurological: Positive for headaches. Negative for dizziness, tremors, seizures, syncope, facial asymmetry, speech difficulty, weakness, light-headedness and numbness.  Hematological: Negative.  Negative for adenopathy. Does not bruise/bleed easily.  Psychiatric/Behavioral: Negative.        Objective:   Physical Exam  Constitutional: She is oriented to person, place, and time. She appears well-developed and well-nourished.  HENT:  Head: Normocephalic and atraumatic.  Eyes: Conjunctivae and EOM are normal. Pupils are equal, round, and reactive to light.  Neck: Normal range of motion. Neck supple.  Cardiovascular: Normal rate, regular rhythm and normal heart sounds.   No murmur heard. Pulmonary/Chest: Effort  normal and breath sounds normal. She has no wheezes.  Abdominal: Soft. Bowel sounds are normal.  Musculoskeletal: Normal range of motion. She exhibits no edema.  Lymphadenopathy:    She has no cervical adenopathy.  Neurological: She is alert and oriented to person, place, and time. No cranial nerve deficit.  Skin: Skin is warm and dry. No rash noted.      Assessment & Plan:  1. Fever in other diseases ? Unknown cause, stop wellbutrin in case serotonin syndrome with other beds, check labs - CBC with Differential/Platelet - HIV antibody - Comprehensive metabolic panel - Rocky mtn spotted fvr abs pnl(IgG+IgM) - Lyme Aby, Wstrn. Blt. IgG & IgM w/bands - Ehrlichia antibody panel  2. Dyspnea on exertion Patient is on estrogen with dyspnea, cough, low grade temps, check ddimer - D-dimer, quantitative (not at [facility]) - DG Chest 2 View; Future  3. Myalgia ? FM versus infection, check labs - Rocky mtn spotted fvr abs pnl(IgG+IgM) - Lyme Aby, Wstrn. Blt. IgG & IgM w/bands - Ehrlichia antibody panel  4. Urinary frequency - Urinalysis, Routine w reflex microscopic (not at [facility]) - Urine culture

## 2016-08-30 NOTE — Patient Instructions (Addendum)
Stop wellbutrin  Going to get some labs on you, if any new symptoms call or message the office If any worsening symptoms or any new shortness of breath, chest pain, etc go to ER  Fever, Adult A fever is an increase in the body's temperature. It is usually defined as a temperature of 100F (38C) or higher. Brief mild or moderate fevers generally have no long-term effects, and they often do not require treatment. Moderate or high fevers may make you feel uncomfortable and can sometimes be a sign of a serious illness or disease. The sweating that may occur with repeated or prolonged fever may also cause dehydration. Fever is confirmed by taking a temperature with a thermometer. A measured temperature can vary with:  Age.  Time of day.  Location of the thermometer:  Mouth (oral).  Rectum (rectal).  Ear (tympanic).  Underarm (axillary).  Forehead (temporal). HOME CARE INSTRUCTIONS Pay attention to any changes in your symptoms. Take these actions to help with your condition:  Take over-the counter and prescription medicines only as told by your health care provider. Follow the dosing instructions carefully.  If you were prescribed an antibiotic medicine, take it as told by your health care provider. Do not stop taking the antibiotic even if you start to feel better.  Rest as needed.  Drink enough fluid to keep your urine clear or pale yellow. This helps to prevent dehydration.  Sponge yourself or bathe with room-temperature water to help reduce your body temperature as needed. Do not use ice water.  Do not overbundle yourself in blankets or heavy clothes. SEEK MEDICAL CARE IF:  You vomit.  You cannot eat or drink without vomiting.  You have diarrhea.  You have pain when you urinate.  Your symptoms do not improve with treatment.  You develop new symptoms.  You develop excessive weakness. SEEK IMMEDIATE MEDICAL CARE IF:  You have shortness of breath or have trouble  breathing.  You are dizzy or you faint.  You are disoriented or confused.  You develop signs of dehydration, such as a dry mouth, decreased urination, or paleness.  You develop severe pain in your abdomen.  You have persistent vomiting or diarrhea.  You develop a skin rash.  Your symptoms suddenly get worse.   This information is not intended to replace advice given to you by your health care provider. Make sure you discuss any questions you have with your health care provider.   Document Released: 04/11/2001 Document Revised: 07/07/2015 Document Reviewed: 12/10/2014 Elsevier Interactive Patient Education [employer].

## 2016-08-31 LAB — URINALYSIS, MICROSCOPIC ONLY
BACTERIA UA: NONE SEEN [HPF]
Casts: NONE SEEN [LPF]
Crystals: NONE SEEN [HPF]
RBC / HPF: NONE SEEN RBC/HPF (ref <=2)
Squamous Epithelial / LPF: NONE SEEN [HPF] (ref <=5)
Yeast: NONE SEEN [HPF]

## 2016-08-31 LAB — URINALYSIS, ROUTINE W REFLEX MICROSCOPIC
Bilirubin Urine: NEGATIVE
Hgb urine dipstick: NEGATIVE
KETONES UR: NEGATIVE
Leukocytes, UA: NEGATIVE
NITRITE: NEGATIVE
PH: 6.5 (ref 5.0–8.0)
Protein, ur: NEGATIVE
SPECIFIC GRAVITY, URINE: 1.007 (ref 1.001–1.035)

## 2016-08-31 LAB — HIV ANTIBODY (ROUTINE TESTING W REFLEX): HIV 1&2 Ab, 4th Generation: NONREACTIVE

## 2016-08-31 LAB — COMPREHENSIVE METABOLIC PANEL
ALBUMIN: 4.2 g/dL (ref 3.6–5.1)
ALK PHOS: 80 U/L (ref 33–130)
ALT: 23 U/L (ref 6–29)
AST: 26 U/L (ref 10–35)
BILIRUBIN TOTAL: 0.4 mg/dL (ref 0.2–1.2)
BUN: 12 mg/dL (ref 7–25)
CHLORIDE: 103 mmol/L (ref 98–110)
CO2: 20 mmol/L (ref 20–31)
CREATININE: 1.01 mg/dL — AB (ref 0.50–0.99)
Calcium: 9.2 mg/dL (ref 8.6–10.4)
Glucose, Bld: 105 mg/dL — ABNORMAL HIGH (ref 65–99)
Potassium: 4.2 mmol/L (ref 3.5–5.3)
SODIUM: 137 mmol/L (ref 135–146)
TOTAL PROTEIN: 6.8 g/dL (ref 6.1–8.1)

## 2016-08-31 LAB — D-DIMER, QUANTITATIVE: D-Dimer, Quant: 0.47 mcg/mL FEU (ref <0.50)

## 2016-09-01 LAB — LYME ABY, WSTRN BLT IGG & IGM W/BANDS
B burgdorferi IgG Abs (IB): NEGATIVE
B burgdorferi IgM Abs (IB): NEGATIVE
LYME DISEASE 23 KD IGG: NONREACTIVE
LYME DISEASE 23 KD IGM: NONREACTIVE
LYME DISEASE 39 KD IGM: NONREACTIVE
LYME DISEASE 41 KD IGG: NONREACTIVE
LYME DISEASE 41 KD IGM: NONREACTIVE
LYME DISEASE 66 KD IGG: NONREACTIVE
Lyme Disease 18 kD IgG: NONREACTIVE
Lyme Disease 28 kD IgG: NONREACTIVE
Lyme Disease 30 kD IgG: NONREACTIVE
Lyme Disease 39 kD IgG: NONREACTIVE
Lyme Disease 45 kD IgG: NONREACTIVE
Lyme Disease 58 kD IgG: NONREACTIVE
Lyme Disease 93 kD IgG: NONREACTIVE

## 2016-09-01 LAB — ROCKY MTN SPOTTED FVR ABS PNL(IGG+IGM)
RMSF IGG: NOT DETECTED
RMSF IgM: NOT DETECTED

## 2016-09-01 LAB — URINE CULTURE

## 2016-09-04 ENCOUNTER — Encounter: Payer: Self-pay | Admitting: Physician Assistant

## 2016-09-07 LAB — EHRLICHIA ANTIBODY PANEL: INTERPRETATION - ERLICHIA CHAFEENSIS: NOT DETECTED

## 2016-09-13 LAB — HM MAMMOGRAPHY: HM MAMMO: NORMAL (ref 0–4)

## 2016-09-19 ENCOUNTER — Ambulatory Visit (INDEPENDENT_AMBULATORY_CARE_PROVIDER_SITE_OTHER): Payer: Medicare Other | Admitting: *Deleted

## 2016-09-19 DIAGNOSIS — J309 Allergic rhinitis, unspecified: Secondary | ICD-10-CM | POA: Diagnosis not present

## 2016-09-28 ENCOUNTER — Ambulatory Visit (INDEPENDENT_AMBULATORY_CARE_PROVIDER_SITE_OTHER): Payer: Medicare Other

## 2016-09-28 DIAGNOSIS — J309 Allergic rhinitis, unspecified: Secondary | ICD-10-CM | POA: Diagnosis not present

## 2016-09-30 ENCOUNTER — Encounter: Payer: Self-pay | Admitting: *Deleted

## 2016-10-06 ENCOUNTER — Ambulatory Visit (INDEPENDENT_AMBULATORY_CARE_PROVIDER_SITE_OTHER): Payer: Medicare Other

## 2016-10-06 DIAGNOSIS — J309 Allergic rhinitis, unspecified: Secondary | ICD-10-CM

## 2016-10-13 ENCOUNTER — Ambulatory Visit (INDEPENDENT_AMBULATORY_CARE_PROVIDER_SITE_OTHER): Payer: Medicare Other | Admitting: *Deleted

## 2016-10-13 DIAGNOSIS — J309 Allergic rhinitis, unspecified: Secondary | ICD-10-CM | POA: Diagnosis not present

## 2016-10-16 ENCOUNTER — Other Ambulatory Visit: Payer: Self-pay | Admitting: Internal Medicine

## 2016-10-20 ENCOUNTER — Ambulatory Visit (INDEPENDENT_AMBULATORY_CARE_PROVIDER_SITE_OTHER): Payer: Medicare Other

## 2016-10-20 DIAGNOSIS — J309 Allergic rhinitis, unspecified: Secondary | ICD-10-CM | POA: Diagnosis not present

## 2016-11-02 ENCOUNTER — Ambulatory Visit (INDEPENDENT_AMBULATORY_CARE_PROVIDER_SITE_OTHER): Payer: Medicare Other | Admitting: Internal Medicine

## 2016-11-02 ENCOUNTER — Encounter: Payer: Self-pay | Admitting: Internal Medicine

## 2016-11-02 VITALS — BP 128/66 | HR 102 | Temp 97.6°F | Resp 18 | Ht 64.0 in | Wt 221.0 lb

## 2016-11-02 DIAGNOSIS — J069 Acute upper respiratory infection, unspecified: Secondary | ICD-10-CM

## 2016-11-02 MED ORDER — PREDNISONE 20 MG PO TABS: ORAL_TABLET | ORAL | 0 refills | Status: DC

## 2016-11-02 MED ORDER — ALBUTEROL SULFATE (2.5 MG/3ML) 0.083% IN NEBU: 2.5000 mg | INHALATION_SOLUTION | RESPIRATORY_TRACT | 0 refills | Status: DC | PRN

## 2016-11-02 MED ORDER — AZITHROMYCIN 250 MG PO TABS: ORAL_TABLET | ORAL | 0 refills | Status: DC

## 2016-11-02 MED ORDER — PROMETHAZINE-DM 6.25-15 MG/5ML PO SYRP: ORAL_SOLUTION | ORAL | 1 refills | Status: DC

## 2016-11-02 NOTE — Progress Notes (Signed)
HPI  Patient presents to the office for evaluation of cough and wheezing.  It has been going on for 4 days.  Patient reports night > day, wet, worse with lying down, wheezing at home.  They also endorse change in voice, chills, fever, wheezing and mild sore throat, no congestion, ear congestion, .  They have tried albuterol every 4 hours, mucinex, astelin, flonase, and daily antihistamine.  They report that nothing has worked.  They admits to other sick contacts.  Her family members have also been sick around her.    Review of Systems  Constitutional: Positive for malaise/fatigue. Negative for chills and fever.  HENT: Positive for congestion, ear pain and sore throat. Negative for hearing loss.   Respiratory: Positive for cough and wheezing. Negative for sputum production and shortness of breath.   Cardiovascular: Negative for chest pain, palpitations and leg swelling.  Neurological: Positive for headaches.    PE:  Vitals:   11/02/16 1136  BP: 128/66  Pulse: (!) 102  Resp: 18  Temp: 97.6 F (36.4 C)   General:  Alert and non-toxic, WDWN, NAD HEENT: NCAT, PERLA, EOM normal, no occular discharge or erythema.  Nasal mucosal edema with sinus tenderness to palpation.  Oropharynx clear with minimal oropharyngeal edema and erythema.  Mucous membranes moist and pink. Neck:  Cervical adenopathy Chest:  RRR no MRGs.  Lungs clear to auscultation A&P with no wheezes rhonchi or rales.   Abdomen: +BS x 4 quadrants, soft, non-tender, no guarding, rigidity, or rebound. Skin: warm and dry no rash Neuro: A&Ox4, CN II-XII grossly intact  Assessment and Plan:   1. Acute URI -prednisone -cont current allergy medications -albuterol for nebulizer use q6hrs prn for cough and wheezing -symbicort 80 sample given 2 puffs BID  -zpak given but patient told to hold it unless fevers or chills to not take it.   -if worsening SOB go to ER -recheck if needed.

## 2016-11-17 NOTE — Addendum Note (Signed)
Addended by: Katherina Right D on: 11/17/2016 03:54 PM   Modules accepted: Orders

## 2016-11-20 ENCOUNTER — Ambulatory Visit: Payer: Self-pay | Admitting: Physician Assistant

## 2016-12-06 ENCOUNTER — Ambulatory Visit (INDEPENDENT_AMBULATORY_CARE_PROVIDER_SITE_OTHER): Payer: Medicare Other | Admitting: *Deleted

## 2016-12-06 DIAGNOSIS — J309 Allergic rhinitis, unspecified: Secondary | ICD-10-CM

## 2016-12-13 ENCOUNTER — Ambulatory Visit (INDEPENDENT_AMBULATORY_CARE_PROVIDER_SITE_OTHER): Payer: Medicare Other | Admitting: Physician Assistant

## 2016-12-13 ENCOUNTER — Encounter: Payer: Self-pay | Admitting: Physician Assistant

## 2016-12-13 VITALS — BP 122/82 | HR 85 | Temp 97.6°F | Resp 14 | Ht 64.0 in | Wt 223.2 lb

## 2016-12-13 DIAGNOSIS — K769 Liver disease, unspecified: Secondary | ICD-10-CM

## 2016-12-13 DIAGNOSIS — F3340 Major depressive disorder, recurrent, in remission, unspecified: Secondary | ICD-10-CM

## 2016-12-13 DIAGNOSIS — G4733 Obstructive sleep apnea (adult) (pediatric): Secondary | ICD-10-CM

## 2016-12-13 DIAGNOSIS — E785 Hyperlipidemia, unspecified: Secondary | ICD-10-CM

## 2016-12-13 DIAGNOSIS — Z23 Encounter for immunization: Secondary | ICD-10-CM

## 2016-12-13 DIAGNOSIS — I10 Essential (primary) hypertension: Secondary | ICD-10-CM

## 2016-12-13 DIAGNOSIS — Z0001 Encounter for general adult medical examination with abnormal findings: Secondary | ICD-10-CM | POA: Diagnosis not present

## 2016-12-13 DIAGNOSIS — R6889 Other general symptoms and signs: Secondary | ICD-10-CM | POA: Diagnosis not present

## 2016-12-13 DIAGNOSIS — K439 Ventral hernia without obstruction or gangrene: Secondary | ICD-10-CM

## 2016-12-13 DIAGNOSIS — E1129 Type 2 diabetes mellitus with other diabetic kidney complication: Secondary | ICD-10-CM

## 2016-12-13 DIAGNOSIS — E559 Vitamin D deficiency, unspecified: Secondary | ICD-10-CM | POA: Diagnosis not present

## 2016-12-13 DIAGNOSIS — Z79899 Other long term (current) drug therapy: Secondary | ICD-10-CM

## 2016-12-13 DIAGNOSIS — J45909 Unspecified asthma, uncomplicated: Secondary | ICD-10-CM

## 2016-12-13 DIAGNOSIS — E039 Hypothyroidism, unspecified: Secondary | ICD-10-CM | POA: Diagnosis not present

## 2016-12-13 DIAGNOSIS — Z0000 Encounter for general adult medical examination without abnormal findings: Secondary | ICD-10-CM

## 2016-12-13 DIAGNOSIS — K219 Gastro-esophageal reflux disease without esophagitis: Secondary | ICD-10-CM

## 2016-12-13 DIAGNOSIS — Z86010 Personal history of colonic polyps: Secondary | ICD-10-CM

## 2016-12-13 DIAGNOSIS — N182 Chronic kidney disease, stage 2 (mild): Secondary | ICD-10-CM

## 2016-12-13 LAB — TSH: TSH: 2.31 m[IU]/L

## 2016-12-13 LAB — LIPID PANEL
Cholesterol: 113 mg/dL (ref <200)
HDL: 39 mg/dL — AB (ref >50)
LDL CALC: 35 mg/dL (ref <100)
TRIGLYCERIDES: 196 mg/dL — AB (ref <150)
Total CHOL/HDL Ratio: 2.9 Ratio (ref <5.0)
VLDL: 39 mg/dL — ABNORMAL HIGH (ref <30)

## 2016-12-13 LAB — CBC WITH DIFFERENTIAL/PLATELET
BASOS ABS: 0 {cells}/uL (ref 0–200)
Basophils Relative: 0 %
EOS ABS: 330 {cells}/uL (ref 15–500)
Eosinophils Relative: 3 %
HEMATOCRIT: 43.2 % (ref 35.0–45.0)
Hemoglobin: 14.2 g/dL (ref 11.7–15.5)
LYMPHS PCT: 36 %
Lymphs Abs: 3960 cells/uL — ABNORMAL HIGH (ref 850–3900)
MCH: 28.6 pg (ref 27.0–33.0)
MCHC: 32.9 g/dL (ref 32.0–36.0)
MCV: 86.9 fL (ref 80.0–100.0)
MONO ABS: 660 {cells}/uL (ref 200–950)
MONOS PCT: 6 %
MPV: 9.7 fL (ref 7.5–12.5)
NEUTROS PCT: 55 %
Neutro Abs: 6050 cells/uL (ref 1500–7800)
PLATELETS: 277 10*3/uL (ref 140–400)
RBC: 4.97 MIL/uL (ref 3.80–5.10)
RDW: 16 % — AB (ref 11.0–15.0)
WBC: 11 10*3/uL — ABNORMAL HIGH (ref 3.8–10.8)

## 2016-12-13 LAB — HEPATIC FUNCTION PANEL
ALBUMIN: 4.4 g/dL (ref 3.6–5.1)
ALK PHOS: 74 U/L (ref 33–130)
ALT: 24 U/L (ref 6–29)
AST: 19 U/L (ref 10–35)
BILIRUBIN TOTAL: 0.4 mg/dL (ref 0.2–1.2)
Bilirubin, Direct: 0.1 mg/dL (ref <=0.2)
Indirect Bilirubin: 0.3 mg/dL (ref 0.2–1.2)
TOTAL PROTEIN: 6.7 g/dL (ref 6.1–8.1)

## 2016-12-13 LAB — BASIC METABOLIC PANEL WITH GFR
BUN: 15 mg/dL (ref 7–25)
CO2: 24 mmol/L (ref 20–31)
CREATININE: 1.22 mg/dL — AB (ref 0.50–0.99)
Calcium: 10 mg/dL (ref 8.6–10.4)
Chloride: 103 mmol/L (ref 98–110)
GFR, EST AFRICAN AMERICAN: 53 mL/min — AB (ref >=60)
GFR, Est Non African American: 46 mL/min — ABNORMAL LOW (ref >=60)
GLUCOSE: 101 mg/dL — AB (ref 65–99)
Potassium: 3.7 mmol/L (ref 3.5–5.3)
Sodium: 141 mmol/L (ref 135–146)

## 2016-12-13 NOTE — Progress Notes (Signed)
MEDICARE ANNUAL WELLNESS VISIT AND FOLLOW UP  Assessment:    Essential hypertension - continue medications, DASH diet, exercise and monitor at home. Call if greater than 130/80.  - CBC with Differential/Platelet - Hepatic function panel - TSH   Obstructive sleep apnea Sleep apnea- continue mouth piece, weight loss advised.   Asthma, unspecified asthma severity, uncomplicated controlled  Gastroesophageal reflux disease without esophagitis Continue PPI/H2 blocker, diet discussed  Liver disease Check labs, avoid tylenol, alcohol, weight loss advised.   Other specified hypothyroidism Hypothyroidism-check TSH level, continue medications the same, reminded to take on an empty stomach 30-61mins before food.   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 - Hemoglobin A1c - Insulin, fasting - HM DIABETES FOOT EXAM  CKD stage 2  Increase fluids, avoid NSAIDS, monitor sugars, will monitor - BASIC METABOLIC PANEL WITH GFR  Depression, remission Grief reaction Add on melatonin at night, if not help, will do minipress  Hyperlipidemia -continue medications, check lipids, decrease fatty foods, increase activity.  - Lipid panel  Medication management - Magnesium   Morbid Obesity (BMI 30-39.9) Obesity with co morbidities- long discussion about weight loss, diet, and exercise  Arthritis RICE, NSAIDS, exercises given, if not better get xray and PT referral or ortho referral.    Ventral hernia without obstruction or gangrene No symptoms  Diarrhea IBS, controlled Will try to decrease metformin see if this helps   History of colonic polyps Due 3 years  Over 30 minutes of exam, counseling, chart review, and critical  decision making was performed  Plan:   During the course of the visit the patient was educated and counseled about appropriate screening and preventive services including:    Pneumococcal vaccine   Influenza vaccine  Td vaccine  Screening electrocardiogram  Screening mammography  Bone densitometry screening  Colorectal cancer screening  Diabetes screening  Glaucoma screening  Nutrition counseling   Advanced directives: given info/requested   Subjective:   Grace Barry is a 66 y.o. female who presents for Welcome to medicare visit and 3 month follow up on hypertension, prediabetes, hyperlipidemia, vitamin D def.   Her blood pressure has been controlled at home, she has been off the HCTZ, she is on diovan 320 cut in half due to hypotension and Metoprolol 25mg  XL for palpitations,BP: 122/82  She does workout. She denies chest pain, shortness of breath, dizziness.  She is on cholesterol medication, crestor 20mg  daily and denies myalgias. Her cholesterol is at goal. The cholesterol was:   Lab Results  Component Value Date   CHOL 131 08/10/2016   HDL 42 (L) 08/10/2016   LDLCALC 57 08/10/2016   TRIG 160 (H) 08/10/2016   CHOLHDL 3.1 08/10/2016   She has been working on diet and exercise for Diabetes with diabetic chronic kidney disease, she is on bASA, she is on ACE/ARB, and denies paresthesia of the feet, polydipsia, polyuria and visual disturbances. Last A1C was:  Lab Results  Component Value Date   HGBA1C 6.2 (H) 08/10/2016  Patient is on Vitamin D supplement. Lab Results  Component Value Date   VD25OH 72 08/10/2016  She is on thyroid medication. Her medication was not changed last visit,.  Lab Results  Component Value Date   TSH 4.21 08/10/2016   She has followed with Dr. Toy Cookey and is on a mouth  piece that is helping her significantly with sleep, feeling more rested, she has been having night terrors more frequently since Jan, son that passed elliot, no sleep  walking.  Has history of migraines, increased in Dec with stress from passing of son, Tiffany Kocher a year ago and went to see Dr. Domingo Cocking for shots.  Son jon, fin grand kid, Engineer, mining.  Sees Dr. Deatra Ina for diarrhea/IBS, will try to get off metofrmin BMI is Body mass index is 38.31 kg/m., she is working on diet and exercise. Wt Readings from Last 3 Encounters:  12/13/16 223 lb 3.2 oz (101.2 kg)  11/02/16 221 lb (100.2 kg)  08/30/16 222 lb 3.2 oz (100.8 kg)    Medication Review Current Outpatient Prescriptions on File Prior to Visit  Medication Sig Dispense Refill  . albuterol (PROAIR HFA) 108 (90 Base) MCG/ACT inhaler Use 2 puffs every four hours as needed for cough or wheeze.  May use 2 puffs 10-20 minutes prior to exercise. 1 Inhaler 1  . albuterol (PROVENTIL) (2.5 MG/3ML) 0.083% nebulizer solution Take 3 mLs (2.5 mg total) by nebulization every 4 (four) hours as needed for wheezing or shortness of breath. 30 vial 0  . ALPRAZolam (XANAX) 0.5 MG tablet Take 0.5 mg by mouth 2 (two) times daily as needed for anxiety.    Marland Kitchen aspirin 81 MG tablet Take 81 mg by mouth daily.    Marland Kitchen azelastine (ASTELIN) 0.1 % nasal spray Use 1-2 sprays in each nostril 1-2 times daily as needed for stuffy nose or drainage. 30 mL 5  . baclofen (LIORESAL) 10 MG tablet Take 10 mg by mouth 2 (two) times daily as needed for muscle spasms.    Marland Kitchen CALCIUM PO Take 1,200 mg by mouth daily.    . cetirizine (ZYRTEC) 10 MG tablet Take 10 mg by mouth daily. Reported on 10/20/2015    . Cholecalciferol (VITAMIN D3) 400 units CAPS Take by mouth.    . DULoxetine (CYMBALTA) 60 MG capsule TAKE ONE CAPSULE BY MOUTH EVERY DAY 90 capsule 3  . EPINEPHrine (EPIPEN 2-PAK) 0.3 mg/0.3 mL IJ SOAJ injection Inject 0.3 mg into the muscle once.    Marland Kitchen esomeprazole (NEXIUM) 40 MG capsule TAKE ONE CAPSULE BY MOUTH EVERY DAY 90 capsule 1  . estradiol (ESTRACE) 1 MG tablet     . Flaxseed, Linseed, (FLAXSEED OIL) 1000 MG CAPS Take by mouth.    . fluticasone  (FLONASE) 50 MCG/ACT nasal spray Use 1-2 sprays in each nostril 1-2 times daily as needed for stuffy nose or drainage. 17 g 5  . glucose blood (ONE TOUCH ULTRA TEST) test strip     . INVOKAMET 503 [id number] [id number] MG TABS TAKE 1 TABLET BY MOUTH 2 TIMES A DAY WITH FOOD 180 tablet 3  . Lancets (ONETOUCH ULTRASOFT) lancets     . Magnesium 250 MG TABS Take 250 mg by mouth 2 (two) times daily.    . metoprolol succinate (TOPROL-XL) 25 MG 24 hr tablet TAKE 1 TABLET BY MOUTH EVERY DAY 90 tablet 1  . Multiple Vitamins-Minerals (MULTIVITAMIN PO) Take 1 tablet by mouth daily.     . Omega-3 Fatty Acids (FISH OIL PO) Take by mouth daily.    Marland Kitchen PRESCRIPTION MEDICATION Inject as directed every 30 (thirty) days. Allergy Shot    . ranitidine (ZANTAC) 300 MG tablet TAKE 1 TABLET TWICE A DAY WHILE TRYING TO GET OFF PPI, THEN CAN GO TO ONCE AT NIGHT 90 tablet 1  . rosuvastatin (CRESTOR) 20 MG tablet Take 1 tablet (20  mg total) by mouth at bedtime. 90 tablet 1  . SYNTHROID 200 MCG tablet TAKE 1 TABLET DAILY ON AN EMPTY STOMACH FOR 30 MINUTES OR AS DIRECTED. 90 tablet 1  . topiramate (TOPAMAX) 100 MG tablet Take 100 mg by mouth daily.    . valsartan (DIOVAN) 320 MG tablet TAKE 1/2 TO 1 TABLET BY MOUTH DAILY FOR BLOOD PRESSURE 90 tablet 0   No current facility-administered medications on file prior to visit.     Current Problems (verified) Patient Active Problem List   Diagnosis Date Noted  . Left ear hearing loss 03/28/2016  . Vitamin D deficiency disease 05/19/2015  . Medication management 05/19/2015  . Medicare annual wellness visit, subsequent 05/19/2015  . Encounter for general adult medical examination without abnormal findings 05/19/2015  . Migraine 02/19/2015  . Vertigo 02/19/2015  . History of colonic polyps 01/15/2015  . Morbid obesity ([place]) 07/01/2014  . Chronic kidney disease (CKD), stage II (mild) 07/01/2014  . Hyperlipidemia   . Liver disease   . Arthritis   . Asthma   . Ventral hernia 06/12/2011  .  Hypothyroidism 12/15/2010  . T2_NIDDM w/CKD (GFR 56 ml/min) 12/15/2010  . Depression, major, recurrent, in remission ([place]) 12/15/2010  . Obstructive sleep apnea 12/15/2010  . Essential hypertension 12/15/2010  . GERD 12/15/2010    Screening Tests Immunization History  Administered Date(s) Administered  . Influenza, High Dose Seasonal PF 08/10/2015, 08/10/2016  . Pneumococcal Conjugate-13 07/01/2014  . Pneumococcal Polysaccharide-23 10/09/2002, 05/26/2008, 12/13/2016  . Td 10/09/2002, 10/30/2010  . Tdap 08/10/2015  . Zoster 06/20/2011    Preventative care: Tetanus: 2016 Pneumovax: 2009 Prevnar 13: 2015 Flu vaccine: 2017 Zostavax: 2012  Pap: 2009 remote declines another MGM: 07/2016 at [place]: 2017 osteopenia Colonoscopy: 10/2014 due 2019 precancerous polyps EGD: 10/2014 + gastritis  Names of Other Physician/Practitioners you currently use: 1. [facility]- here for primary care 2. Dr. Wynetta Emery, [place], eye doctor, last visit 12/2015 3. Dr. Sarajane Jews, dentist, last visit q 6 months 4. Dr. Domingo Cocking, neuro 5. Dr. Toy Cookey, sleep apnea 6. Dr. Deatra Ina GI Patient Care Team: Unk Pinto, MD as PCP - General (Internal Medicine)  Allergies Allergies  Allergen Reactions  . Floxin [Ofloxacin] Hives  . Viberzi [Eluxadoline] Hives  . Doxycycline Rash    SURGICAL HISTORY She  has a past surgical history that includes Total knee arthroplasty; Cholecystectomy; Abdominal hysterectomy; Cesarean section; Tonsillectomy; Knee arthroscopy; Carpal tunnel release; Hernia repair (07/24/11); and Joint replacement. FAMILY HISTORY Her family history includes Breast cancer in her mother; Colon cancer in her son; Esophageal cancer (age of onset: 70) in her son; Hyperlipidemia in her father and sister; Hypertension in her father; Prostate cancer in her father; Stomach cancer in her son. SOCIAL HISTORY She  reports that she has never smoked. She has never  used smokeless tobacco. She reports that she drinks alcohol. She reports that she does not use drugs.  MEDICARE WELLNESS OBJECTIVES: Physical activity: Current Exercise Habits: The patient does not participate in regular exercise at present Cardiac risk factors: Cardiac Risk Factors include: advanced age (>5men, >33 women);diabetes mellitus;dyslipidemia;hypertension;sedentary lifestyle;obesity (BMI >30kg/m2) Depression/mood screen:   Depression screen [facility] 2/9 12/13/2016  Decreased Interest 0  Down, Depressed, Hopeless 0  PHQ - 2 Score 0  Altered sleeping -  Tired, decreased energy -  Change in appetite -  Feeling bad or failure about yourself  -  Trouble concentrating -  Moving slowly or fidgety/restless -  Suicidal thoughts -  PHQ-9 Score -  ADLs:  In your present state of health, do you have any difficulty performing the following activities: 12/13/2016  Hearing? N  Vision? N  Difficulty concentrating or making decisions? N  Walking or climbing stairs? N  Dressing or bathing? N  Doing errands, shopping? N  Some recent data might be hidden     Cognitive Testing  Alert? Yes  Normal Appearance?Yes  Oriented to person? Yes  Place? Yes   Time? Yes  Recall of three objects?  Yes  Can perform simple calculations? Yes  Displays appropriate judgment?Yes  Can read the correct time from a watch face?Yes  EOL planning: Does Patient Have a Medical Advance Directive?: Yes Type of Advance Directive: Healthcare Power of Attorney, Living will Copy of [place] in Chart?: No - copy requested  Review of Systems:  Review of Systems  Constitutional: Negative.   HENT: Negative for congestion, ear discharge, ear pain, hearing loss, nosebleeds, sore throat and tinnitus.   Eyes: Negative.   Respiratory: Negative.  Negative for stridor.   Cardiovascular: Negative.   Gastrointestinal: Positive for diarrhea. Negative for abdominal pain, blood in stool, constipation,  heartburn, melena, nausea and vomiting.  Genitourinary: Negative.   Musculoskeletal: Negative.  Negative for falls.  Skin: Negative.   Neurological: Positive for headaches. Negative for dizziness, tingling, tremors, sensory change, speech change, focal weakness, seizures and loss of consciousness.  Endo/Heme/Allergies: Negative.   Psychiatric/Behavioral: Negative.  Negative for depression.     Objective:   Today's Vitals   12/13/16 1031  BP: 122/82  Pulse: 85  Resp: 14  Temp: 97.6 F (36.4 C)  SpO2: 97%  Weight: 223 lb 3.2 oz (101.2 kg)  Height: 5\' 4"  (1.626 m)  PainSc: 0-No pain     General appearance: alert, no distress, WD/WN,  female HEENT: normocephalic, sclerae anicteric, TMs pearly, nares patent, no discharge or erythema, pharynx normal Oral cavity: MMM, no lesions Neck: supple, no lymphadenopathy, no thyromegaly, no masses Heart: RRR, normal S1, S2, no murmurs Lungs: CTA bilaterally, no wheezes, rhonchi, or rales Abdomen: +bs, soft, non tender, non distended, no masses, no hepatomegaly, no splenomegaly Musculoskeletal: nontender, no swelling, no obvious deformity Extremities: no edema, no cyanosis, no clubbing Pulses: 2+ symmetric, upper and lower extremities, normal cap refill Neurological: alert, oriented x 3, CN2-12 intact, strength normal upper extremities and lower extremities, sensation normal throughout, DTRs 2+ throughout, no cerebellar signs, gait normal Psychiatric: normal affect, behavior normal, pleasant  Breast: defer Gyn: defer Rectal: defer  Medicare Attestation I have personally reviewed: The patient's medical and social history Their use of alcohol, tobacco or illicit drugs Their current medications and supplements The patient's functional ability including ADLs,fall risks, home safety risks, cognitive, and hearing and visual impairment Diet and physical activities Evidence for depression or mood disorders  The patient's weight, height, BMI,  and visual acuity have been recorded in the chart.  I have made referrals, counseling, and provided education to the patient based on review of the above and I have provided the patient with a written personalized care plan for preventive services.     Vicie Mutters, PA-C   12/13/2016

## 2016-12-13 NOTE — Patient Instructions (Signed)
Try the melatonin 5mg -20mg  dissolvable or gummy 30 mins before bed Increase walking during the day   Simple math prevails.    1st - exercise does not produce significant weight loss - at best one converts fat into muscle , "bulks up", loses inches, but usually stays "weight neutral"     2nd - think of your body weightas a check book: If you eat more calories than you burn up - you save money or gain weight .... Or if you spend more money than you put in the check book, ie burn up more calories than you eat, then you lose weight     3rd - if you walk or run 1 mile, you burn up 100 calories - you have to burn up 3,500 calories to lose 1 pound, ie you have to walk/run 35 miles to lose 1 measly pound. So if you want to lose 10 #, then you have to walk/run 350 miles, so.... clearly exercise is not the solution.     4. So if you consume 1,500 calories, then you have to burn up the equivalent of 15 miles to stay weight neutral - It also stands to reason that if you consume 1,500 cal/day and don't lose weight, then you must be burning up about 1,500 cals/day to stay weight neutral.     5. If you really want to lose weight, you must cut your calorie intake 300 calories /day and at that rate you should lose about 1 # every 3 days.   6. Please purchase Grace Barry's book(s) "The End of Dieting" & "Eat to Live" . It has some great concepts and recipes.

## 2016-12-14 LAB — VITAMIN D 25 HYDROXY (VIT D DEFICIENCY, FRACTURES): Vit D, 25-Hydroxy: 83 ng/mL (ref 30–100)

## 2016-12-14 LAB — HEMOGLOBIN A1C
HEMOGLOBIN A1C: 6.2 % — AB (ref <5.7)
Mean Plasma Glucose: 131 mg/dL

## 2016-12-14 LAB — MAGNESIUM: Magnesium: 1.9 mg/dL (ref 1.5–2.5)

## 2017-01-04 ENCOUNTER — Encounter: Payer: Self-pay | Admitting: Physician Assistant

## 2017-01-08 MED ORDER — METFORMIN HCL ER (MOD) 500 MG PO TB24: 500.0000 mg | ORAL_TABLET | Freq: Two times a day (BID) | ORAL | 3 refills | Status: DC

## 2017-01-08 MED ORDER — CANAGLIFLOZIN 300 MG PO TABS: 300.0000 mg | ORAL_TABLET | Freq: Every day | ORAL | 3 refills | Status: DC

## 2017-01-11 ENCOUNTER — Other Ambulatory Visit: Payer: Self-pay | Admitting: Internal Medicine

## 2017-01-11 MED ORDER — METFORMIN HCL ER 500 MG PO TB24: ORAL_TABLET | ORAL | 2 refills | Status: DC

## 2017-01-12 ENCOUNTER — Ambulatory Visit (INDEPENDENT_AMBULATORY_CARE_PROVIDER_SITE_OTHER): Payer: Medicare Other | Admitting: *Deleted

## 2017-01-12 ENCOUNTER — Other Ambulatory Visit: Payer: Self-pay

## 2017-01-12 ENCOUNTER — Other Ambulatory Visit: Payer: Self-pay | Admitting: Internal Medicine

## 2017-01-12 DIAGNOSIS — J309 Allergic rhinitis, unspecified: Secondary | ICD-10-CM

## 2017-01-12 MED ORDER — METFORMIN HCL ER 500 MG PO TB24: ORAL_TABLET | ORAL | 1 refills | Status: DC

## 2017-02-13 ENCOUNTER — Other Ambulatory Visit: Payer: Self-pay | Admitting: Internal Medicine

## 2017-02-14 ENCOUNTER — Ambulatory Visit (INDEPENDENT_AMBULATORY_CARE_PROVIDER_SITE_OTHER): Payer: Medicare Other

## 2017-02-14 ENCOUNTER — Other Ambulatory Visit: Payer: Self-pay

## 2017-02-14 DIAGNOSIS — J309 Allergic rhinitis, unspecified: Secondary | ICD-10-CM | POA: Diagnosis not present

## 2017-02-14 MED ORDER — CANAGLIFLOZIN 300 MG PO TABS: 300.0000 mg | ORAL_TABLET | Freq: Every day | ORAL | 0 refills | Status: DC

## 2017-03-14 ENCOUNTER — Encounter: Payer: Self-pay | Admitting: *Deleted

## 2017-03-14 NOTE — Progress Notes (Signed)
Maintenance vials made  

## 2017-03-15 DIAGNOSIS — J3089 Other allergic rhinitis: Secondary | ICD-10-CM | POA: Diagnosis not present

## 2017-03-16 DIAGNOSIS — J301 Allergic rhinitis due to pollen: Secondary | ICD-10-CM | POA: Diagnosis not present

## 2017-03-19 ENCOUNTER — Other Ambulatory Visit: Payer: Self-pay | Admitting: Internal Medicine

## 2017-03-27 ENCOUNTER — Ambulatory Visit (INDEPENDENT_AMBULATORY_CARE_PROVIDER_SITE_OTHER): Payer: Medicare Other

## 2017-03-27 DIAGNOSIS — J309 Allergic rhinitis, unspecified: Secondary | ICD-10-CM

## 2017-03-28 ENCOUNTER — Encounter: Payer: Self-pay | Admitting: Internal Medicine

## 2017-03-28 ENCOUNTER — Ambulatory Visit (INDEPENDENT_AMBULATORY_CARE_PROVIDER_SITE_OTHER): Payer: Medicare Other | Admitting: Internal Medicine

## 2017-03-28 VITALS — BP 126/70 | HR 80 | Temp 97.5°F | Resp 16 | Ht 64.0 in | Wt 215.6 lb

## 2017-03-28 DIAGNOSIS — E1122 Type 2 diabetes mellitus with diabetic chronic kidney disease: Secondary | ICD-10-CM

## 2017-03-28 DIAGNOSIS — K219 Gastro-esophageal reflux disease without esophagitis: Secondary | ICD-10-CM

## 2017-03-28 DIAGNOSIS — I10 Essential (primary) hypertension: Secondary | ICD-10-CM

## 2017-03-28 DIAGNOSIS — N183 Chronic kidney disease, stage 3 unspecified: Secondary | ICD-10-CM

## 2017-03-28 DIAGNOSIS — Z79899 Other long term (current) drug therapy: Secondary | ICD-10-CM | POA: Diagnosis not present

## 2017-03-28 DIAGNOSIS — E782 Mixed hyperlipidemia: Secondary | ICD-10-CM

## 2017-03-28 DIAGNOSIS — E039 Hypothyroidism, unspecified: Secondary | ICD-10-CM

## 2017-03-28 DIAGNOSIS — E559 Vitamin D deficiency, unspecified: Secondary | ICD-10-CM

## 2017-03-28 LAB — HEPATIC FUNCTION PANEL
ALBUMIN: 4 g/dL (ref 3.6–5.1)
ALT: 21 U/L (ref 6–29)
AST: 20 U/L (ref 10–35)
Alkaline Phosphatase: 70 U/L (ref 33–130)
BILIRUBIN DIRECT: 0.1 mg/dL (ref <=0.2)
BILIRUBIN TOTAL: 0.6 mg/dL (ref 0.2–1.2)
Indirect Bilirubin: 0.5 mg/dL (ref 0.2–1.2)
Total Protein: 6.5 g/dL (ref 6.1–8.1)

## 2017-03-28 LAB — CBC WITH DIFFERENTIAL/PLATELET
BASOS PCT: 0 %
Basophils Absolute: 0 cells/uL (ref 0–200)
EOS PCT: 3 %
Eosinophils Absolute: 273 cells/uL (ref 15–500)
HCT: 45.1 % — ABNORMAL HIGH (ref 35.0–45.0)
Hemoglobin: 14.5 g/dL (ref 11.7–15.5)
LYMPHS PCT: 30 %
Lymphs Abs: 2730 cells/uL (ref 850–3900)
MCH: 29 pg (ref 27.0–33.0)
MCHC: 32.2 g/dL (ref 32.0–36.0)
MCV: 90.2 fL (ref 80.0–100.0)
MONOS PCT: 7 %
MPV: 10.2 fL (ref 7.5–12.5)
Monocytes Absolute: 637 cells/uL (ref 200–950)
NEUTROS ABS: 5460 {cells}/uL (ref 1500–7800)
Neutrophils Relative %: 60 %
PLATELETS: 254 10*3/uL (ref 140–400)
RBC: 5 MIL/uL (ref 3.80–5.10)
RDW: 14.6 % (ref 11.0–15.0)
WBC: 9.1 10*3/uL (ref 3.8–10.8)

## 2017-03-28 LAB — BASIC METABOLIC PANEL WITH GFR
BUN: 10 mg/dL (ref 7–25)
CHLORIDE: 104 mmol/L (ref 98–110)
CO2: 23 mmol/L (ref 20–31)
CREATININE: 1.11 mg/dL — AB (ref 0.50–0.99)
Calcium: 9.2 mg/dL (ref 8.6–10.4)
GFR, EST AFRICAN AMERICAN: 59 mL/min — AB (ref >=60)
GFR, Est Non African American: 52 mL/min — ABNORMAL LOW (ref >=60)
Glucose, Bld: 144 mg/dL — ABNORMAL HIGH (ref 65–99)
POTASSIUM: 4 mmol/L (ref 3.5–5.3)
SODIUM: 139 mmol/L (ref 135–146)

## 2017-03-28 LAB — LIPID PANEL
CHOL/HDL RATIO: 3.9 ratio (ref <5.0)
Cholesterol: 154 mg/dL (ref <200)
HDL: 39 mg/dL — AB (ref >50)
LDL Cholesterol: 80 mg/dL (ref <100)
Triglycerides: 174 mg/dL — ABNORMAL HIGH (ref <150)
VLDL: 35 mg/dL — ABNORMAL HIGH (ref <30)

## 2017-03-28 LAB — TSH: TSH: 4.12 mIU/L

## 2017-03-28 NOTE — Patient Instructions (Signed)

## 2017-03-28 NOTE — Progress Notes (Signed)
This very nice 66 y.o. MWF presents for  follow up with Hypertension, Hyperlipidemia, T2_NIDDM, Hypothyroid and Vitamin D Deficiency. Patient has OSA on CPAP since 2006.     Patient is treated for HTN circa 2009 & BP has been controlled at home. Today's BP is at goal - 126/70. Patient has had no complaints of any cardiac type chest pain, palpitations, dyspnea/orthopnea/PND, dizziness, claudication, or dependent edema.     Hyperlipidemia is controlled with diet & meds. Patient denies myalgias or other med SE's. Last Lipids were at goal albeit elevated Trig's: Lab Results  Component Value Date   CHOL 113 12/13/2016   HDL 39 (L) 12/13/2016   LDLCALC 35 12/13/2016   TRIG 196 (H) 12/13/2016   CHOLHDL 2.9 12/13/2016      Also, the patient has history of Morbid Obesity (BMI 36+) and consequent T2_NIDDM since 2006  and has had no symptoms of reactive hypoglycemia, diabetic polys, paresthesias or visual blurring.  Last A1c was not at goal: Lab Results  Component Value Date   HGBA1C 6.2 (H) 12/13/2016      Patient has been on Thyroid replacement since 2008. Further, the patient also has history of Vitamin D Deficiency and supplements vitamin D without any suspected side-effects. Last vitamin D was at goal: Lab Results  Component Value Date   VD25OH 83 12/13/2016   Current Outpatient Prescriptions on File Prior to Visit  Medication Sig  . albuterol (PROAIR HFA) 108 (90 Base) MCG/ACT inhaler Use 2 puffs every four hours as needed for cough or wheeze.  May use 2 puffs 10-20 minutes prior to exercise.  Marland Kitchen albuterol (PROVENTIL) (2.5 MG/3ML) 0.083% nebulizer solution Take 3 mLs (2.5 mg total) by nebulization every 4 (four) hours as needed for wheezing or shortness of breath.  Marland Kitchen aspirin 81 MG tablet Take 81 mg by mouth daily.  Marland Kitchen azelastine (ASTELIN) 0.1 % nasal spray Use 1-2 sprays in each nostril 1-2 times daily as needed for stuffy nose or drainage.  . baclofen (LIORESAL) 10 MG tablet Take 10 mg  by mouth 2 (two) times daily as needed for muscle spasms.  Marland Kitchen CALCIUM PO Take 1,200 mg by mouth daily.  . canagliflozin (INVOKANA) 300 MG TABS tablet Take 1 tablet (300 mg total) by mouth daily before breakfast.  . cetirizine (ZYRTEC) 10 MG tablet Take 10 mg by mouth daily. Reported on 10/20/2015  . Cholecalciferol (VITAMIN D3) 400 units CAPS Take by mouth.  . DULoxetine (CYMBALTA) 60 MG capsule TAKE ONE CAPSULE BY MOUTH EVERY DAY  . EPINEPHrine (EPIPEN 2-PAK) 0.3 mg/0.3 mL IJ SOAJ injection Inject 0.3 mg into the muscle once.  Marland Kitchen esomeprazole (NEXIUM) 40 MG capsule TAKE ONE CAPSULE BY MOUTH EVERY DAY  . estradiol (ESTRACE) 1 MG tablet TAKE ONE-HALF TO ONE TABLET BY MOUTH ONCE DAILY  . Flaxseed, Linseed, (FLAXSEED OIL) 1000 MG CAPS Take by mouth.  . fluconazole (DIFLUCAN) 150 MG tablet MAY TAKE 1 TABLET WEEKLY FOR YEAST INFECTION IF NEEDED  . fluticasone (FLONASE) 50 MCG/ACT nasal spray Use 1-2 sprays in each nostril 1-2 times daily as needed for stuffy nose or drainage.  Marland Kitchen glucose blood (ONE TOUCH ULTRA TEST) test strip   . INVOKAMET [phone] MG TABS TAKE 1 TABLET BY MOUTH 2 TIMES A DAY WITH FOOD  . Lancets (ONETOUCH ULTRASOFT) lancets   . Magnesium 250 MG TABS Take 250 mg by mouth 2 (two) times daily.  . metFORMIN (GLUCOPHAGE XR) 500 MG 24 hr tablet Take 1  to 2 tablets 2 x/ day with food for Diabetes  . metoprolol succinate (TOPROL-XL) 25 MG 24 hr tablet TAKE 1 TABLET BY MOUTH EVERY DAY  . Multiple Vitamins-Minerals (MULTIVITAMIN PO) Take 1 tablet by mouth daily.   . Omega-3 Fatty Acids (FISH OIL PO) Take by mouth daily.  Marland Kitchen PRESCRIPTION MEDICATION Inject as directed every 30 (thirty) days. Allergy Shot  . ranitidine (ZANTAC) 300 MG tablet TAKE 1 TABLET TWICE A DAY WHILE TRYING TO GET OFF PPI, THEN CAN GO TO ONCE AT NIGHT  . rosuvastatin (CRESTOR) 20 MG tablet Take 1 tablet (20 mg total) by mouth at bedtime.  Marland Kitchen SYNTHROID 200 MCG tablet TAKE 1 TABLET DAILY ON AN EMPTY STOMACH FOR 30 MINUTES OR  AS DIRECTED.  Marland Kitchen topiramate (TOPAMAX) 100 MG tablet Take 100 mg by mouth daily.  . valsartan (DIOVAN) 320 MG tablet TAKE 1/2 TO 1 TABLET BY MOUTH DAILY FOR BLOOD PRESSURE   No current facility-administered medications on file prior to visit.    Allergies  Allergen Reactions  . Floxin [Ofloxacin] Hives  . Viberzi [Eluxadoline] Hives  . Doxycycline Rash   PMHx:   Past Medical History:  Diagnosis Date  . Allergy   . Anxiety   . Arthritis   . Asthma   . Depression   . Fibromyalgia   . GERD (gastroesophageal reflux disease)   . Hyperlipidemia   . Hypertension   . Liver disease   . Migraine 02/19/2015  . Migraines   . Obesity   . Sleep apnea   . Sleep difficulties   . Thyroid disease    hypothyroidism  . Type II or unspecified type diabetes mellitus without mention of complication, not stated as uncontrolled   . Ventral hernia   . Vertigo 02/19/2015   Immunization History  Administered Date(s) Administered  . Influenza, High Dose Seasonal PF 08/10/2015, 08/10/2016  . Pneumococcal Conjugate-13 07/01/2014  . Pneumococcal Polysaccharide-23 10/09/2002, 05/26/2008, 12/13/2016  . Td 10/09/2002, 10/30/2010  . Tdap 08/10/2015  . Zoster 06/20/2011   Past Surgical History:  Procedure Laterality Date  . ABDOMINAL HYSTERECTOMY    . CARPAL TUNNEL RELEASE     bilateral  . CESAREAN SECTION     x2  . CHOLECYSTECTOMY    . HERNIA REPAIR  07/24/11   ventral hernia  . JOINT REPLACEMENT    . KNEE ARTHROSCOPY     bilateral  . TONSILLECTOMY    . TOTAL KNEE ARTHROPLASTY     bilateral   FHx:    Reviewed / unchanged  SHx:    Reviewed / unchanged  Systems Review:  Constitutional: Denies fever, chills, wt changes, headaches, insomnia, fatigue, night sweats, change in appetite. Eyes: Denies redness, blurred vision, diplopia, discharge, itchy, watery eyes.  ENT: Denies discharge, congestion, post nasal drip, epistaxis, sore throat, earache, hearing loss, dental pain, tinnitus, vertigo,  sinus pain, snoring.  CV: Denies chest pain, palpitations, irregular heartbeat, syncope, dyspnea, diaphoresis, orthopnea, PND, claudication or edema. Respiratory: denies cough, dyspnea, DOE, pleurisy, hoarseness, laryngitis, wheezing.  Gastrointestinal: Denies dysphagia, odynophagia, heartburn, reflux, water brash, abdominal pain or cramps, nausea, vomiting, bloating, diarrhea, constipation, hematemesis, melena, hematochezia  or hemorrhoids. Genitourinary: Denies dysuria, frequency, urgency, nocturia, hesitancy, discharge, hematuria or flank pain. Musculoskeletal: Denies arthralgias, myalgias, stiffness, jt. swelling, pain, limping or strain/sprain.  Skin: Denies pruritus, rash, hives, warts, acne, eczema or change in skin lesion(s). Neuro: No weakness, tremor, incoordination, spasms, paresthesia or pain. Psychiatric: Denies confusion, memory loss or sensory loss. Endo: Denies change in weight,  skin or hair change.  Heme/Lymph: No excessive bleeding, bruising or enlarged lymph nodes.  Physical Exam  BP 126/70   Pulse 80   Temp 97.5 F (36.4 C)   Resp 16   Ht 5\' 4"  (1.626 m)   Wt 215 lb 9.6 oz (97.8 kg)   BMI 37.01 kg/m   Appears well nourished, well groomed  and in no distress.  Eyes: PERRLA, EOMs, conjunctiva no swelling or erythema. Sinuses: No frontal/maxillary tenderness ENT/Mouth: EAC's clear, TM's nl w/o erythema, bulging. Nares clear w/o erythema, swelling, exudates. Oropharynx clear without erythema or exudates. Oral hygiene is good. Tongue normal, non obstructing. Hearing intact.  Neck: Supple. Thyroid nl. Car 2+/2+ without bruits, nodes or JVD. Chest: Respirations nl with BS clear & equal w/o rales, rhonchi, wheezing or stridor.  Cor: Heart sounds normal w/ regular rate and rhythm without sig. murmurs, gallops, clicks or rubs. Peripheral pulses normal and equal  without edema.  Abdomen: Soft & bowel sounds normal. Non-tender w/o guarding, rebound, hernias, masses or  organomegaly.  Lymphatics: Unremarkable.  Musculoskeletal: Full ROM all peripheral extremities, joint stability, 5/5 strength and normal gait.  Skin: Warm, dry without exposed rashes, lesions or ecchymosis apparent.  Neuro: Cranial nerves intact, reflexes equal bilaterally. Sensory-motor testing grossly intact. Tendon reflexes grossly intact.  Pysch: Alert & oriented x 3.  Insight and judgement nl & appropriate. No ideations.  Assessment and Plan:  1. Essential hypertension  - Continue medication, monitor blood pressure at home.  - Continue DASH diet. Reminder to go to the ER if any CP,  SOB, nausea, dizziness, severe HA, changes vision/speech,  left arm numbness and tingling and jaw pain.  - CBC with Differential/Platelet - BASIC METABOLIC PANEL WITH GFR - Magnesium - TSH  2. Hyperlipidemia, mixed  - Continue diet/meds, exercise,& lifestyle modifications.  - Continue monitor periodic cholesterol/liver & renal functions   - Hepatic function panel - Lipid panel - TSH  3. T2_NIDDM w/CKD 3  (HCC)  - Continue diet, exercise, lifestyle modifications.  - Monitor appropriate labs.  - Insulin, random  4. Vitamin D deficiency disease  - Continue supplementation.  - VITAMIN D 25 Hydroxy   5. Gastroesophageal reflux disease   6. Hypothyroidism  - TSH  7. Medication management  - CBC with Differential/Platelet - BASIC METABOLIC PANEL WITH GFR - Hepatic function panel - Magnesium - Lipid panel - TSH - Hemoglobin A1c - Insulin, random - VITAMIN D 25 Hydroxy        Discussed  regular exercise, BP monitoring, weight control to achieve/maintain BMI less than 25 and discussed med and SE's. Recommended labs to assess and monitor clinical status with further disposition pending results of labs. Over 30 minutes of exam, counseling, chart review was performed.

## 2017-03-29 LAB — INSULIN, RANDOM: INSULIN: 48.6 u[IU]/mL — AB (ref 2.0–19.6)

## 2017-03-29 LAB — HEMOGLOBIN A1C
Hgb A1c MFr Bld: 6.1 % — ABNORMAL HIGH (ref <5.7)
Mean Plasma Glucose: 128 mg/dL

## 2017-03-29 LAB — MAGNESIUM: MAGNESIUM: 1.8 mg/dL (ref 1.5–2.5)

## 2017-03-29 LAB — VITAMIN D 25 HYDROXY (VIT D DEFICIENCY, FRACTURES): VIT D 25 HYDROXY: 83 ng/mL (ref 30–100)

## 2017-04-02 ENCOUNTER — Ambulatory Visit (INDEPENDENT_AMBULATORY_CARE_PROVIDER_SITE_OTHER): Payer: Medicare Other | Admitting: Allergy & Immunology

## 2017-04-02 ENCOUNTER — Encounter: Payer: Self-pay | Admitting: Allergy & Immunology

## 2017-04-02 VITALS — BP 130/88 | HR 84 | Resp 18

## 2017-04-02 DIAGNOSIS — J4520 Mild intermittent asthma, uncomplicated: Secondary | ICD-10-CM | POA: Diagnosis not present

## 2017-04-02 DIAGNOSIS — J3089 Other allergic rhinitis: Secondary | ICD-10-CM | POA: Diagnosis not present

## 2017-04-02 NOTE — Progress Notes (Signed)
FOLLOW UP  Date of Service/Encounter:  04/02/17   Assessment:   Mild intermittent asthma, uncomplicated  Perennial allergic rhinitis   Asthma Reportables:  Severity: intermittent  Risk: low Control: well controlled   Plan/Recommendations:   1. Allergic rhinoconjunctivitis - on allergen immunotherapy for 7 years - I would concentrate on nasal steroid use twice daily for 1-2 weeks.  - If there is no improvement in these symptoms, call us back and we can send in antibiotics.  - Continue with shots at the same schedule.  - We did discuss stopping allergy shots, but Grace Barry would rather continue with them since she is doing so well.  2. Mild intermittent asthma, uncomplicated - Continue albuterol 4 puffs every 4-6 hours as needed.   3. Return in about 1 year (around 04/02/2018).    Subjective:   Grace Barry is a 66 y.o. female presenting today for follow up of  Chief Complaint  Patient presents with  . Asthma  . Allergic Rhinitis   . Medication Management    Grace Barry has a history of the following: Patient Active Problem List   Diagnosis Date Noted  . Left ear hearing loss 03/28/2016  . Vitamin D deficiency disease 05/19/2015  . Medication management 05/19/2015  . Medicare annual wellness visit, subsequent 05/19/2015  . Encounter for general adult medical examination without abnormal findings 05/19/2015  . Migraine 02/19/2015  . Vertigo 02/19/2015  . History of colonic polyps 01/15/2015  . Morbid obesity ([place]) 07/01/2014  . Chronic kidney disease (CKD), stage II (mild) 07/01/2014  . Hyperlipidemia   . Liver disease   . Arthritis   . Asthma   . Ventral hernia 06/12/2011  . Hypothyroidism 12/15/2010  . T2_NIDDM w/CKD (GFR 56 ml/min) 12/15/2010  . Depression, major, recurrent, in remission ([place]) 12/15/2010  . Obstructive sleep apnea 12/15/2010  . Essential hypertension 12/15/2010  . GERD 12/15/2010    History obtained from: chart review and  patient.  Grace Barry was referred by Grace Pinto, MD.     Anntionette is a 66 y.o. female presenting for a follow up visit. She was last seen in July 2017. At that time, she was doing very well on shots. I recommended starting fluticasone one spray per nostril twice daily as well as azelastine one spray per nostril twice daily. I also recommended adding cetirizine 10mg  daily for breakthrough symptoms. Her asthma was under good control with albuterol as needed. She is currently receiving 2 injections every month (dander/mold + grass/tree/weed).   Since the last visit, she has mostly done well. Virdell's asthma has been well controlled. She has not required rescue medication, experienced nocturnal awakenings due to lower respiratory symptoms, nor have activities of daily living been limited. ACT is 25 today. She has had no ED visits or Urgent Care visits for her asthma. She has required no prednisone courses since the last visit.   Albert continues to have problems when she goes to visit her son in [place]. She did use her nasal sprays which did take the edge off. She does endorse sinus pain for the past month. She does not feel congestion or drainage but she continues to feel discomfort. She grades it as a 5/10 on a scale. She returned from a three week [place] trip on May 13th. This is her bad time of the year. She does endorse 2 weeks of bilateral maxillary pressure. She has had no discharge or fever. She has not been using her nasal steroids routinely.  She does use nasal saline rinses regularly.  We had a long conversation about cessation of allergy shots. She is very nervous about this given her improvement on allergy shots. She has been on shots for approximately 7 years. Currently she is not paying anything for her shots and would rather continue them since she is doing so well.  She does have a complicated past medical history including hypertension and diabetes, among other problems. These are all  monitored very closely by her primary care physician. In February 2017, she developed a viral illness that led to left-sided deafness (50% hearing loss). She did see an ENT who actually administered steroid injections into her middle ear. Unfortunately, all of her hearing was not restored. She does now have a left hearing aid. Otherwise, there have been no changes to her past medical history, surgical history, family history, or social history. She is currently retired. She worked as a sixth Land. She has one son with her family in the [place] area. She had another son who passed away at age 30 from esophageal cancer approximately 3 years ago.    Review of Systems: a 14-point review of systems is pertinent for what is mentioned in HPI.  Otherwise, all other systems were negative. Constitutional: negative other than that listed in the HPI Eyes: negative other than that listed in the HPI Ears, nose, mouth, throat, and face: negative other than that listed in the HPI Respiratory: negative other than that listed in the HPI Cardiovascular: negative other than that listed in the HPI Gastrointestinal: negative other than that listed in the HPI Genitourinary: negative other than that listed in the HPI Integument: negative other than that listed in the HPI Hematologic: negative other than that listed in the HPI Musculoskeletal: negative other than that listed in the HPI Neurological: negative other than that listed in the HPI Allergy/Immunologic: negative other than that listed in the HPI    Objective:   Blood pressure 130/88, pulse 84, resp. rate 18, SpO2 98 %. There is no height or weight on file to calculate BMI.   Physical Exam:  General: Alert, interactive, in no acute distress. Pleasant female.  Eyes: No conjunctival injection present on the right, No conjunctival injection present on the left, PERRL bilaterally, No discharge on the right, No discharge on the left and No  Horner-Trantas dots present Ears: Right TM pearly gray with normal light reflex, Left TM pearly gray with normal light reflex, Right TM intact without perforation and Left TM intact without perforation.  Nose/Throat: External nose within normal limits and septum midline, turbinates edematous and pale with clear discharge, post-pharynx erythematous with cobblestoning in the posterior oropharynx. Tonsils 2+ without exudates. Bilateral maxillary sinus tenderness.  Neck: Supple without thyromegaly. Lungs: Clear to auscultation without wheezing, rhonchi or rales. No increased work of breathing. CV: Normal S1/S2, no murmurs. Capillary refill <2 seconds.  Skin: Warm and dry, without lesions or rashes. Neuro:   Grossly intact. No focal deficits appreciated. Responsive to questions.   Diagnostic studies:   Spirometry: results normal (FEV1: 1.90/91%, FVC: 2.38/90%, FEV1/FVC: 79%).    Spirometry consistent with normal pattern.   Allergy Studies: none    Salvatore Marvel, MD [facility]

## 2017-04-02 NOTE — Patient Instructions (Addendum)
1. Allergic rhinoconjunctivitis - on allergy shots - I would concentrate on nasal steroid use twice daily for 1-2 weeks.  - If there is no improvement in these symptoms, call us back and we can send in antibiotics.  - Continue with shots at the same schedule.   2. Mild intermittent asthma, uncomplicated - Continue albuterol 4 puffs every 4-6 hours as needed.   3. Return in about 1 year (around 04/02/2018).  Please inform us of any Emergency Department visits, hospitalizations, or changes in symptoms. Call us before going to the ED for breathing or allergy symptoms since we might be able to fit you in for a sick visit. Feel free to contact us anytime with any questions, problems, or concerns.  It was a pleasure to see you again today! Happy summer!   Websites that have reliable patient information: 1. American Academy of Asthma, Allergy, and Immunology: www.aaaai.org 2. Food Allergy Research and Education (FARE): foodallergy.org 3. Mothers of Asthmatics: http://www.asthmacommunitynetwork.org 4. American College of Allergy, Asthma, and Immunology: www.acaai.org

## 2017-04-07 ENCOUNTER — Other Ambulatory Visit: Payer: Self-pay | Admitting: Internal Medicine

## 2017-04-11 ENCOUNTER — Encounter: Payer: Self-pay | Admitting: Allergy & Immunology

## 2017-04-12 MED ORDER — CLARITHROMYCIN 500 MG PO TABS: 500.0000 mg | ORAL_TABLET | Freq: Two times a day (BID) | ORAL | 0 refills | Status: DC

## 2017-05-02 ENCOUNTER — Other Ambulatory Visit: Payer: Self-pay | Admitting: Internal Medicine

## 2017-05-02 ENCOUNTER — Other Ambulatory Visit: Payer: Self-pay | Admitting: Physician Assistant

## 2017-05-07 ENCOUNTER — Ambulatory Visit (INDEPENDENT_AMBULATORY_CARE_PROVIDER_SITE_OTHER): Payer: Medicare Other

## 2017-05-07 DIAGNOSIS — J309 Allergic rhinitis, unspecified: Secondary | ICD-10-CM

## 2017-05-22 ENCOUNTER — Other Ambulatory Visit: Payer: Self-pay | Admitting: Internal Medicine

## 2017-05-23 ENCOUNTER — Other Ambulatory Visit: Payer: Self-pay | Admitting: Internal Medicine

## 2017-06-14 ENCOUNTER — Other Ambulatory Visit: Payer: Self-pay | Admitting: Internal Medicine

## 2017-06-14 ENCOUNTER — Encounter: Payer: Self-pay | Admitting: Internal Medicine

## 2017-06-14 DIAGNOSIS — I10 Essential (primary) hypertension: Secondary | ICD-10-CM

## 2017-06-14 MED ORDER — LOSARTAN POTASSIUM 100 MG PO TABS: ORAL_TABLET | ORAL | 1 refills | Status: DC

## 2017-06-27 ENCOUNTER — Other Ambulatory Visit: Payer: Self-pay | Admitting: Physician Assistant

## 2017-07-05 ENCOUNTER — Ambulatory Visit (INDEPENDENT_AMBULATORY_CARE_PROVIDER_SITE_OTHER): Payer: Medicare Other | Admitting: *Deleted

## 2017-07-05 DIAGNOSIS — J309 Allergic rhinitis, unspecified: Secondary | ICD-10-CM

## 2017-07-10 ENCOUNTER — Ambulatory Visit (INDEPENDENT_AMBULATORY_CARE_PROVIDER_SITE_OTHER): Payer: Medicare Other | Admitting: *Deleted

## 2017-07-10 DIAGNOSIS — J309 Allergic rhinitis, unspecified: Secondary | ICD-10-CM

## 2017-07-19 ENCOUNTER — Ambulatory Visit (INDEPENDENT_AMBULATORY_CARE_PROVIDER_SITE_OTHER): Payer: Medicare Other | Admitting: *Deleted

## 2017-07-19 DIAGNOSIS — J309 Allergic rhinitis, unspecified: Secondary | ICD-10-CM | POA: Diagnosis not present

## 2017-07-25 ENCOUNTER — Ambulatory Visit (INDEPENDENT_AMBULATORY_CARE_PROVIDER_SITE_OTHER): Payer: Medicare Other

## 2017-07-25 DIAGNOSIS — J309 Allergic rhinitis, unspecified: Secondary | ICD-10-CM | POA: Diagnosis not present

## 2017-08-12 ENCOUNTER — Other Ambulatory Visit: Payer: Self-pay | Admitting: Internal Medicine

## 2017-08-15 ENCOUNTER — Ambulatory Visit (INDEPENDENT_AMBULATORY_CARE_PROVIDER_SITE_OTHER): Payer: Medicare Other | Admitting: *Deleted

## 2017-08-15 DIAGNOSIS — J309 Allergic rhinitis, unspecified: Secondary | ICD-10-CM | POA: Diagnosis not present

## 2017-08-18 ENCOUNTER — Other Ambulatory Visit: Payer: Self-pay | Admitting: Internal Medicine

## 2017-08-22 ENCOUNTER — Encounter: Payer: Self-pay | Admitting: Physician Assistant

## 2017-08-27 NOTE — Progress Notes (Signed)
CPE AND FOLLOW UP  Assessment:    Essential hypertension - continue medications, DASH diet, exercise and monitor at home. Call if greater than 130/80.  - CBC with Differential/Platelet - Hepatic function panel - TSH   Obstructive sleep apnea Sleep apnea- continue mouth piece, weight loss advised.   Asthma, unspecified asthma severity, uncomplicated controlled  Gastroesophageal reflux disease without esophagitis Continue PPI/H2 blocker, diet discussed  Liver disease Check labs, avoid tylenol, alcohol, weight loss advised.   Other specified hypothyroidism Hypothyroidism-check TSH level, continue medications the same, reminded to take on an empty stomach 30-20mins before food.   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 - Hemoglobin A1c - Insulin, fasting - OVERDUE EYE EXAM, GET EYE EXAM, SEND [place] SHEET  CKD stage 2  Increase fluids, avoid NSAIDS, monitor sugars, will monitor - BASIC METABOLIC PANEL WITH GFR  Depression, remission Grief reaction Add on melatonin at night  Hyperlipidemia -continue medications, check lipids, decrease fatty foods, increase activity.  - Lipid panel  Medication management - Magnesium   Morbid Obesity (BMI 30-39.9) Obesity with co morbidities- long discussion about weight loss, diet, and exercise  Arthritis RICE, NSAIDS, exercises given, if not better get xray and PT referral or ortho referral.    Ventral hernia without obstruction or gangrene No symptoms  Diarrhea IBS, controlled   History of colonic polyps Due 2019  Hearing loss of left ear, unspecified hearing loss type Has hearing aids  Needs flu shot -     Flu vaccine HIGH DOSE PF  Resting tremor with  imbalance, night terrors/abnormal sleep, fatigue No bradykinesia, no cog wheeling but will refer to neuro to rule out parkinsonism Normal MRI 2016 -     Ambulatory referral to Neurology  Screening, anemia, deficiency, iron -     Iron,Total/Total Iron Binding Cap -     Vitamin B12   Over 40 minutes of exam, counseling, chart review, and critical decision making was performed   Subjective:   Grace Barry is a 66 y.o. female who presents for CPE and 3 month follow up on hypertension, prediabetes, hyperlipidemia, vitamin D def.   Her blood pressure has been controlled at home, she has been off the HCTZ, she is on diovan 320 cut in half due to hypotension and Metoprolol 25mg  XL for palpitations,BP: 124/84  She does workout, has done sliver sneakers.  She denies chest pain, shortness of breath, dizziness.  She is on cholesterol medication, crestor 20mg  daily and denies myalgias. Her cholesterol is at goal. The cholesterol was:   Lab Results  Component Value Date   CHOL 154 03/28/2017   HDL 39 (L) 03/28/2017   LDLCALC 80 03/28/2017   TRIG 174 (H) 03/28/2017   CHOLHDL 3.9 03/28/2017   She has been working on diet and exercise for Diabetes with diabetic chronic kidney disease, she is on bASA, she is on ACE/ARB, and denies paresthesia of the feet, polydipsia, polyuria and visual disturbances. Last A1C was:  Lab Results  Component Value Date   HGBA1C 6.1 (H) 03/28/2017  Patient is on Vitamin D supplement. Lab Results  Component Value Date   VD25OH 68 03/28/2017  She is on thyroid medication. Her medication was not changed last visit,.  Lab Results  Component Value Date   TSH 4.12 03/28/2017   She has followed  with Dr. Toy Cookey and is on a mouth piece that is helping her significantly with sleep, feeling more rested but still has day time fatigue.  Complains of new balance issues, very sporadic, denies dizziness, had an episdoe this AM, she was washing her hair, held the wall an was  better, does not happen with standing quickly. Feels she needs to hold onto walls and rails more with stairs, but denies leg weakness, numbness tingling in legs. Has had some urgency/leaking incontinence but this is unchanged. She has had right hand rest tremor, states will be sitting and see it, can stop it when she is aware of it, has had some sleep/night terrors. Some anomia occ.  Specific Symptoms:  Tremor: yes at rest Voice: softer, hypophonic (pt relates to GERD) Sleep: trouble Vivid Dreams: yes. Acting out dreams: yes, night terrors Wet Pillows: no Postural symptoms: Yes. Falls? No. Bradykinesia symptoms: husband states she is walking slowly, denies difficulty getting out of a chair Loss of smell: No Loss of taste: No. Urinary Incontinence: Yes. , intermittent and associated with urgency Difficulty Swallowing: Yes occ Handwriting, micrographia: No  (R handed) Trouble with ADL's: No Trouble buttoning clothing: No Depression: Comes and goes since loss of son elliot, son jon relapsed on cocaine. Memory changes: No. Hallucinations: No. visual distortions: No. N/V: No. Lightheaded: No Syncope: No. Diplopia: No. Dyskinesia: No.  Neuroimaging Normal MRI brain 02/2015   She denies any associated neurological complications or symptoms, such as one-sided weakness, numbness, tingling, slurring of speech, droopy face, swallowing difficulties, diplopia, vision loss, hearing loss or tinnitus.   Has history of migraines, has seen Dr. Domingo Cocking.  Son jon, fin grand kid, Engineer, mining.  Sees Dr. Deatra Ina for diarrhea/IBS, will try to get off metofrmin BMI is Body mass index is 37.79 kg/m., she is working on diet and exercise. Wt Readings from Last 3 Encounters:  08/28/17 215 lb (97.5 kg)  03/28/17 215 lb 9.6 oz (97.8 kg)  12/13/16 223 lb 3.2 oz (101.2 kg)    Medication Review Current Outpatient  Prescriptions on File Prior to Visit  Medication Sig Dispense Refill  . albuterol (PROAIR HFA) 108 (90 Base) MCG/ACT inhaler Use 2 puffs every four hours as needed for cough or wheeze.  May use 2 puffs 10-20 minutes prior to exercise. 1 Inhaler 1  . albuterol (PROVENTIL) (2.5 MG/3ML) 0.083% nebulizer solution Take 3 mLs (2.5 mg total) by nebulization every 4 (four) hours as needed for wheezing or shortness of breath. 30 vial 0  . aspirin 81 MG tablet Take 81 mg by mouth daily.    Marland Kitchen azelastine (ASTELIN) 0.1 % nasal spray Use 1-2 sprays in each nostril 1-2 times daily as needed for stuffy nose or drainage. 30 mL 5  . baclofen (LIORESAL) 10 MG tablet Take 10 mg by mouth 2 (two) times daily as needed for muscle spasms.    Marland Kitchen CALCIUM PO Take 1,200 mg by mouth daily.    . cetirizine (ZYRTEC) 10 MG tablet Take 10 mg by mouth as needed. Reported on 10/20/2015    . DULoxetine (CYMBALTA) 60 MG capsule TAKE ONE CAPSULE BY MOUTH EVERY DAY 90 capsule 3  . EPINEPHrine (EPIPEN 2-PAK) 0.3 mg/0.3 mL IJ SOAJ injection Inject 0.3 mg into the muscle once.    Marland Kitchen estradiol (ESTRACE) 1 MG tablet TAKE ONE-HALF TO ONE TABLET BY MOUTH ONCE DAILY 90 tablet 1  . fluconazole (DIFLUCAN) 150 MG tablet MAY TAKE 1 TABLET WEEKLY FOR YEAST INFECTION IF NEEDED 4 tablet 1  .  fluticasone (FLONASE) 50 MCG/ACT nasal spray Use 1-2 sprays in each nostril 1-2 times daily as needed for stuffy nose or drainage. 17 g 5  . glucose blood (ONE TOUCH ULTRA TEST) test strip     . INVOKANA 300 MG TABS tablet TAKE 1 TABLET (300 MG TOTAL) BY MOUTH DAILY BEFORE BREAKFAST. 90 tablet 0  . Lancets (ONETOUCH ULTRASOFT) lancets     . losartan (COZAAR) 100 MG tablet Take 1 tablet daily for BP and Kidney protection 90 tablet 1  . Magnesium 250 MG TABS Take 250 mg by mouth 2 (two) times daily.    . metFORMIN (GLUCOPHAGE-XR) 500 MG 24 hr tablet TAKE 1 TO 2 TABLETS BY MOUTH 2 X/ DAY WITH FOOD FOR DIABETES 180 tablet 1  . metoprolol succinate (TOPROL-XL) 25 MG 24  hr tablet TAKE 1 TABLET BY MOUTH EVERY DAY 90 tablet 1  . Multiple Vitamins-Minerals (MULTIVITAMIN PO) Take 1 tablet by mouth daily.     . Omega-3 Fatty Acids (FISH OIL PO) Take by mouth daily.    Marland Kitchen PRESCRIPTION MEDICATION Inject as directed every 30 (thirty) days. Allergy Shot    . ranitidine (ZANTAC) 300 MG tablet TAKE 1 TABLET TWICE A DAY WHILE TRYING TO GET OFF PPI, THEN CAN GO TO ONCE AT NIGHT 90 tablet 1  . rosuvastatin (CRESTOR) 20 MG tablet TAKE 1 TABLET (20 MG TOTAL) BY MOUTH AT BEDTIME. 90 tablet 0  . SYNTHROID 200 MCG tablet TAKE 1 TABLET DAILY ON AN EMPTY STOMACH FOR 30 MINUTES OR AS DIRECTED. 90 tablet 1  . topiramate (TOPAMAX) 100 MG tablet Take 100 mg by mouth daily.    . Vitamin D, Ergocalciferol, (DRISDOL) 50000 units CAPS capsule TAKE 1 CAPSULE BY MOUTH 4 DAYS A WEEK FOR VITAMIN D DEFICIENCY 48 capsule 0  . Cholecalciferol (VITAMIN D3) 400 units CAPS Take by mouth.    . esomeprazole (NEXIUM) 40 MG capsule TAKE ONE CAPSULE BY MOUTH EVERY DAY (Patient not taking: Reported on 04/02/2017) 90 capsule 1  . Flaxseed, Linseed, (FLAXSEED OIL) 1000 MG CAPS Take by mouth.    . INVOKAMET [phone] MG TABS TAKE 1 TABLET BY MOUTH 2 TIMES A DAY WITH FOOD (Patient not taking: Reported on 04/02/2017) 180 tablet 3  . metFORMIN (GLUCOPHAGE XR) 500 MG 24 hr tablet Take 1 to 2 tablets 2 x/ day with food for Diabetes 120 tablet 2   No current facility-administered medications on file prior to visit.     Current Problems (verified) Patient Active Problem List   Diagnosis Date Noted  . Left ear hearing loss 03/28/2016  . Vitamin D deficiency disease 05/19/2015  . Medication management 05/19/2015  . Medicare annual wellness visit, subsequent 05/19/2015  . Encounter for general adult medical examination without abnormal findings 05/19/2015  . Migraine 02/19/2015  . Vertigo 02/19/2015  . History of colonic polyps 01/15/2015  . Morbid obesity ([place]) 07/01/2014  . Chronic kidney disease (CKD), stage II  (mild) 07/01/2014  . Hyperlipidemia   . Liver disease   . Arthritis   . Asthma   . Ventral hernia 06/12/2011  . Hypothyroidism 12/15/2010  . T2_NIDDM w/CKD (GFR 56 ml/min) 12/15/2010  . Depression, major, recurrent, in remission ([place]) 12/15/2010  . Obstructive sleep apnea 12/15/2010  . Essential hypertension 12/15/2010  . GERD 12/15/2010    Screening Tests Immunization History  Administered Date(s) Administered  . Influenza, High Dose Seasonal PF 08/10/2015, 08/10/2016, 08/28/2017  . Pneumococcal Conjugate-13 07/01/2014  . Pneumococcal Polysaccharide-23 10/09/2002, 05/26/2008, 12/13/2016  .  Td 10/09/2002, 10/30/2010  . Tdap 08/10/2015  . Zoster 06/20/2011    Preventative care: Tetanus: 2016 Pneumovax: 2009 Prevnar 13: 2015 Flu vaccine: 2017 Zostavax: 2012  Pap: 2009 remote declines another MGM: 08/2016 at [place]: 2017 osteopenia Colonoscopy: 10/2014 due 2019 precancerous polyps EGD: 10/2014 + gastritis  Names of Other Physician/Practitioners you currently use: 1. [facility]- here for primary care 2. Dr. Wynetta Emery, [place], eye doctor, last visit 12/2015 3. Dr. Sarajane Jews, dentist, last visit q 6 months 4. Dr. Domingo Cocking, neuro 5. Dr. Toy Cookey, sleep apnea 6. Dr. Deatra Ina GI Patient Care Team: Unk Pinto, MD as PCP - General (Internal Medicine)  Allergies Allergies  Allergen Reactions  . Floxin [Ofloxacin] Hives  . Viberzi [Eluxadoline] Hives  . Doxycycline Rash    SURGICAL HISTORY She  has a past surgical history that includes Total knee arthroplasty; Cholecystectomy; Abdominal hysterectomy; Cesarean section; Tonsillectomy; Knee arthroscopy; Carpal tunnel release; Hernia repair (07/24/11); and Joint replacement. FAMILY HISTORY Her family history includes Breast cancer in her mother; Colon cancer in her son; Esophageal cancer (age of onset: 16) in her son; Hyperlipidemia in her father and sister; Hypertension in her father;  Prostate cancer in her father; Stomach cancer in her son. SOCIAL HISTORY She  reports that she has never smoked. She has never used smokeless tobacco. She reports that she drinks alcohol. She reports that she does not use drugs. Husband roger 3 kids, elliot her son passed 2016 Son in [place], history substance abuse, doing well now, married, grandson fin Son in [place] with grand kids Luz Lex a lot  Review of Systems:  Review of Systems  Constitutional: Positive for malaise/fatigue. Negative for chills, diaphoresis, fever and weight loss.  HENT: Negative for congestion, ear discharge, ear pain, hearing loss, nosebleeds, sore throat and tinnitus.   Eyes: Negative.   Respiratory: Negative.  Negative for stridor.   Cardiovascular: Negative.   Gastrointestinal: Positive for diarrhea. Negative for abdominal pain, blood in stool, constipation, heartburn, melena, nausea and vomiting.  Genitourinary: Negative.   Musculoskeletal: Negative.  Negative for falls.  Skin: Negative.   Neurological: Positive for dizziness, tremors and headaches. Negative for tingling, sensory change, speech change, focal weakness, seizures, loss of consciousness and weakness.  Endo/Heme/Allergies: Negative.   Psychiatric/Behavioral: Negative.  Negative for depression, hallucinations, memory loss, substance abuse and suicidal ideas. The patient is not nervous/anxious and does not have insomnia.      Objective:   Today's Vitals   08/28/17 1409  BP: 124/84  Pulse: 88  Resp: 15  Temp: (!) 95.5 F (35.3 C)  SpO2: 97%  Weight: 215 lb (97.5 kg)  Height: 5' 3.25" (1.607 m)   General appearance: alert, no distress, WD/WN,  female HEENT: normocephalic, sclerae anicteric, TMs pearly, nares patent, no discharge or erythema, pharynx normal Oral cavity: MMM, no lesions Neck: supple, no lymphadenopathy, no thyromegaly, no masses Heart: RRR, normal S1, S2, no murmurs Lungs: CTA bilaterally, no wheezes, rhonchi, or  rales Abdomen: +bs, soft, non tender, non distended, no masses, no hepatomegaly, no splenomegaly Musculoskeletal: nontender, no swelling, no obvious deformity Extremities: no edema, no cyanosis, no clubbing Pulses: 2+ symmetric, upper and lower extremities, normal cap refill Neurological: alert, oriented x 3, CN2-12 intact, strength normal upper extremities and lower extremities, sensation normal throughout, DTRs 2+ throughout,, gait normal, no cog wheeling, no visible tremor on exam. + romberg Psychiatric: normal affect, behavior normal, pleasant  Breast: defer Gyn: defer Rectal: defer   Vicie Mutters, PA-C   08/28/2017

## 2017-08-28 ENCOUNTER — Other Ambulatory Visit: Payer: Self-pay | Admitting: Internal Medicine

## 2017-08-28 ENCOUNTER — Ambulatory Visit (INDEPENDENT_AMBULATORY_CARE_PROVIDER_SITE_OTHER): Payer: Medicare Other | Admitting: *Deleted

## 2017-08-28 ENCOUNTER — Ambulatory Visit (INDEPENDENT_AMBULATORY_CARE_PROVIDER_SITE_OTHER): Payer: Medicare Other | Admitting: Physician Assistant

## 2017-08-28 ENCOUNTER — Encounter: Payer: Self-pay | Admitting: Physician Assistant

## 2017-08-28 ENCOUNTER — Encounter: Payer: Self-pay | Admitting: Internal Medicine

## 2017-08-28 VITALS — BP 124/84 | HR 88 | Temp 95.5°F | Resp 15 | Ht 63.25 in | Wt 215.0 lb

## 2017-08-28 DIAGNOSIS — N182 Chronic kidney disease, stage 2 (mild): Secondary | ICD-10-CM

## 2017-08-28 DIAGNOSIS — Z79899 Other long term (current) drug therapy: Secondary | ICD-10-CM

## 2017-08-28 DIAGNOSIS — E559 Vitamin D deficiency, unspecified: Secondary | ICD-10-CM

## 2017-08-28 DIAGNOSIS — G4733 Obstructive sleep apnea (adult) (pediatric): Secondary | ICD-10-CM

## 2017-08-28 DIAGNOSIS — I10 Essential (primary) hypertension: Secondary | ICD-10-CM

## 2017-08-28 DIAGNOSIS — F3340 Major depressive disorder, recurrent, in remission, unspecified: Secondary | ICD-10-CM

## 2017-08-28 DIAGNOSIS — R259 Unspecified abnormal involuntary movements: Secondary | ICD-10-CM

## 2017-08-28 DIAGNOSIS — K219 Gastro-esophageal reflux disease without esophagitis: Secondary | ICD-10-CM

## 2017-08-28 DIAGNOSIS — K769 Liver disease, unspecified: Secondary | ICD-10-CM

## 2017-08-28 DIAGNOSIS — Z23 Encounter for immunization: Secondary | ICD-10-CM

## 2017-08-28 DIAGNOSIS — E785 Hyperlipidemia, unspecified: Secondary | ICD-10-CM

## 2017-08-28 DIAGNOSIS — G43009 Migraine without aura, not intractable, without status migrainosus: Secondary | ICD-10-CM

## 2017-08-28 DIAGNOSIS — Z130 Encounter for screening for diseases of the blood and blood-forming organs and certain disorders involving the immune mechanism: Secondary | ICD-10-CM

## 2017-08-28 DIAGNOSIS — J309 Allergic rhinitis, unspecified: Secondary | ICD-10-CM | POA: Diagnosis not present

## 2017-08-28 DIAGNOSIS — J45909 Unspecified asthma, uncomplicated: Secondary | ICD-10-CM

## 2017-08-28 DIAGNOSIS — M1990 Unspecified osteoarthritis, unspecified site: Secondary | ICD-10-CM

## 2017-08-28 DIAGNOSIS — Z0000 Encounter for general adult medical examination without abnormal findings: Secondary | ICD-10-CM

## 2017-08-28 DIAGNOSIS — E039 Hypothyroidism, unspecified: Secondary | ICD-10-CM

## 2017-08-28 DIAGNOSIS — R2689 Other abnormalities of gait and mobility: Secondary | ICD-10-CM

## 2017-08-28 DIAGNOSIS — Z136 Encounter for screening for cardiovascular disorders: Secondary | ICD-10-CM | POA: Diagnosis not present

## 2017-08-28 DIAGNOSIS — E1129 Type 2 diabetes mellitus with other diabetic kidney complication: Secondary | ICD-10-CM

## 2017-08-28 DIAGNOSIS — G252 Other specified forms of tremor: Secondary | ICD-10-CM

## 2017-08-28 DIAGNOSIS — H9192 Unspecified hearing loss, left ear: Secondary | ICD-10-CM

## 2017-08-28 MED ORDER — METFORMIN HCL ER 500 MG PO TB24: ORAL_TABLET | ORAL | 2 refills | Status: DC

## 2017-08-28 NOTE — Patient Instructions (Signed)
We have made a referral for you to get imaging or go to another doctor. We will try to get this as soon as possible but please understand that we often need to get approval with your insurance before we can schedule and not every office can accommodate you quickly. So please allow 5-7 business days for [place] to call you back about the referral.      Bad carbs also include fruit juice, alcohol, and sweet tea. These are empty calories that do not signal to your brain that you are full.   Please remember the good carbs are still carbs which convert into sugar. So please measure them out no more than 1/2-1 cup of rice, oatmeal, pasta, and beans  Veggies are however free foods! Pile them on.   Not all fruit is created equal. Please see the list below, the fruit at the bottom is higher in sugars than the fruit at the top. Please avoid all dried fruits.

## 2017-08-29 ENCOUNTER — Encounter: Payer: Self-pay | Admitting: Physician Assistant

## 2017-08-29 LAB — CBC WITH DIFFERENTIAL/PLATELET
BASOS PCT: 0.6 %
Basophils Absolute: 56 cells/uL (ref 0–200)
EOS PCT: 2.7 %
Eosinophils Absolute: 251 cells/uL (ref 15–500)
HEMATOCRIT: 42.4 % (ref 35.0–45.0)
Hemoglobin: 14.6 g/dL (ref 11.7–15.5)
LYMPHS ABS: 3339 {cells}/uL (ref 850–3900)
MCH: 30 pg (ref 27.0–33.0)
MCHC: 34.4 g/dL (ref 32.0–36.0)
MCV: 87.2 fL (ref 80.0–100.0)
MPV: 10.4 fL (ref 7.5–12.5)
Monocytes Relative: 7.3 %
Neutro Abs: 4976 cells/uL (ref 1500–7800)
Neutrophils Relative %: 53.5 %
PLATELETS: 303 10*3/uL (ref 140–400)
RBC: 4.86 10*6/uL (ref 3.80–5.10)
RDW: 13.6 % (ref 11.0–15.0)
TOTAL LYMPHOCYTE: 35.9 %
WBC: 9.3 10*3/uL (ref 3.8–10.8)
WBCMIX: 679 {cells}/uL (ref 200–950)

## 2017-08-29 LAB — IRON, TOTAL/TOTAL IRON BINDING CAP
%SAT: 21 % (ref 11–50)
IRON: 74 ug/dL (ref 45–160)
TIBC: 348 ug/dL (ref 250–450)

## 2017-08-29 LAB — HEPATIC FUNCTION PANEL
AG RATIO: 1.6 (calc) (ref 1.0–2.5)
ALKALINE PHOSPHATASE (APISO): 78 U/L (ref 33–130)
ALT: 22 U/L (ref 6–29)
AST: 18 U/L (ref 10–35)
Albumin: 4.1 g/dL (ref 3.6–5.1)
BILIRUBIN INDIRECT: 0.4 mg/dL (ref 0.2–1.2)
Bilirubin, Direct: 0.1 mg/dL (ref 0.0–0.2)
Globulin: 2.5 g/dL (calc) (ref 1.9–3.7)
TOTAL PROTEIN: 6.6 g/dL (ref 6.1–8.1)
Total Bilirubin: 0.5 mg/dL (ref 0.2–1.2)

## 2017-08-29 LAB — BASIC METABOLIC PANEL WITH GFR
BUN / CREAT RATIO: 10 (calc) (ref 6–22)
BUN: 11 mg/dL (ref 7–25)
CHLORIDE: 104 mmol/L (ref 98–110)
CO2: 27 mmol/L (ref 20–32)
CREATININE: 1.05 mg/dL — AB (ref 0.50–0.99)
Calcium: 9.8 mg/dL (ref 8.6–10.4)
GFR, Est African American: 64 mL/min/{1.73_m2} (ref >=60)
GFR, Est Non African American: 55 mL/min/{1.73_m2} — ABNORMAL LOW (ref >=60)
GLUCOSE: 79 mg/dL (ref 65–99)
POTASSIUM: 4.4 mmol/L (ref 3.5–5.3)
SODIUM: 141 mmol/L (ref 135–146)

## 2017-08-29 LAB — URINALYSIS, ROUTINE W REFLEX MICROSCOPIC
Bilirubin Urine: NEGATIVE
HGB URINE DIPSTICK: NEGATIVE
KETONES UR: NEGATIVE
Leukocytes, UA: NEGATIVE
NITRITE: NEGATIVE
PH: 7.5 (ref 5.0–8.0)
PROTEIN: NEGATIVE
Specific Gravity, Urine: 1.008 (ref 1.001–1.03)

## 2017-08-29 LAB — HEMOGLOBIN A1C
EAG (MMOL/L): 7 (calc)
Hgb A1c MFr Bld: 6 % of total Hgb — ABNORMAL HIGH (ref <5.7)
Mean Plasma Glucose: 126 (calc)

## 2017-08-29 LAB — LIPID PANEL
Cholesterol: 113 mg/dL (ref <200)
HDL: 37 mg/dL — AB (ref >50)
LDL Cholesterol (Calc): 46 mg/dL (calc)
NON-HDL CHOLESTEROL (CALC): 76 mg/dL (ref <130)
Total CHOL/HDL Ratio: 3.1 (calc) (ref <5.0)
Triglycerides: 252 mg/dL — ABNORMAL HIGH (ref <150)

## 2017-08-29 LAB — VITAMIN D 25 HYDROXY (VIT D DEFICIENCY, FRACTURES): VIT D 25 HYDROXY: 86 ng/mL (ref 30–100)

## 2017-08-29 LAB — MICROALBUMIN / CREATININE URINE RATIO
Creatinine, Urine: 26 mg/dL (ref 20–275)
Microalb, Ur: <0.2 mg/dL

## 2017-08-29 LAB — TSH: TSH: 0.49 mIU/L (ref 0.40–4.50)

## 2017-08-29 LAB — VITAMIN B12: VITAMIN B 12: 330 pg/mL (ref 200–1100)

## 2017-08-29 LAB — MAGNESIUM: MAGNESIUM: 2.2 mg/dL (ref 1.5–2.5)

## 2017-08-29 MED ORDER — RANITIDINE HCL 300 MG PO TABS: ORAL_TABLET | ORAL | 1 refills | Status: DC

## 2017-09-03 ENCOUNTER — Encounter: Payer: Self-pay | Admitting: Physician Assistant

## 2017-09-25 LAB — HM DIABETES EYE EXAM

## 2017-09-27 ENCOUNTER — Other Ambulatory Visit: Payer: Self-pay | Admitting: Internal Medicine

## 2017-10-01 ENCOUNTER — Ambulatory Visit (INDEPENDENT_AMBULATORY_CARE_PROVIDER_SITE_OTHER): Payer: Medicare Other | Admitting: *Deleted

## 2017-10-01 DIAGNOSIS — J309 Allergic rhinitis, unspecified: Secondary | ICD-10-CM

## 2017-10-02 ENCOUNTER — Encounter: Payer: Self-pay | Admitting: *Deleted

## 2017-10-02 LAB — HM MAMMOGRAPHY

## 2017-10-04 ENCOUNTER — Encounter: Payer: Self-pay | Admitting: *Deleted

## 2017-10-17 ENCOUNTER — Encounter: Payer: Self-pay | Admitting: *Deleted

## 2017-10-17 DIAGNOSIS — J3089 Other allergic rhinitis: Secondary | ICD-10-CM | POA: Diagnosis not present

## 2017-10-17 NOTE — Progress Notes (Signed)
VIALS MADE. EXP: 10-17-18. HV 

## 2017-10-18 DIAGNOSIS — J301 Allergic rhinitis due to pollen: Secondary | ICD-10-CM | POA: Diagnosis not present

## 2017-11-13 ENCOUNTER — Encounter: Payer: Self-pay | Admitting: Neurology

## 2017-11-13 ENCOUNTER — Ambulatory Visit (INDEPENDENT_AMBULATORY_CARE_PROVIDER_SITE_OTHER): Payer: Medicare Other | Admitting: *Deleted

## 2017-11-13 ENCOUNTER — Ambulatory Visit: Payer: Medicare Other | Admitting: Neurology

## 2017-11-13 VITALS — BP 148/92 | HR 87 | Ht 63.0 in | Wt 216.5 lb

## 2017-11-13 DIAGNOSIS — R251 Tremor, unspecified: Secondary | ICD-10-CM | POA: Diagnosis not present

## 2017-11-13 DIAGNOSIS — R413 Other amnesia: Secondary | ICD-10-CM | POA: Diagnosis not present

## 2017-11-13 DIAGNOSIS — J309 Allergic rhinitis, unspecified: Secondary | ICD-10-CM | POA: Diagnosis not present

## 2017-11-13 MED ORDER — METOPROLOL SUCCINATE ER 50 MG PO TB24: 50.0000 mg | ORAL_TABLET | Freq: Every day | ORAL | 3 refills | Status: DC

## 2017-11-13 MED ORDER — TOPIRAMATE 50 MG PO TABS: 50.0000 mg | ORAL_TABLET | Freq: Every day | ORAL | 3 refills | Status: DC

## 2017-11-13 NOTE — Patient Instructions (Signed)
   We will reduce the topamax to 50 mg at night, and increase the Toprol to 50 mg a day.  We will get MRI of the brain.
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
## 2017-11-14 ENCOUNTER — Other Ambulatory Visit: Payer: Self-pay | Admitting: Physician Assistant

## 2017-11-19 ENCOUNTER — Other Ambulatory Visit: Payer: Self-pay

## 2017-11-19 MED ORDER — ROSUVASTATIN CALCIUM 20 MG PO TABS: ORAL_TABLET | ORAL | 0 refills | Status: DC

## 2017-11-20 ENCOUNTER — Ambulatory Visit
Admission: RE | Admit: 2017-11-20 | Discharge: 2017-11-20 | Disposition: A | Discharge status: Home / Self Care | Payer: Medicare Other | Source: Ambulatory Visit | Attending: Neurology | Admitting: Neurology

## 2017-11-20 DIAGNOSIS — R413 Other amnesia: Secondary | ICD-10-CM

## 2017-11-20 DIAGNOSIS — R251 Tremor, unspecified: Secondary | ICD-10-CM

## 2017-11-21 ENCOUNTER — Ambulatory Visit (INDEPENDENT_AMBULATORY_CARE_PROVIDER_SITE_OTHER): Payer: Medicare Other

## 2017-11-21 ENCOUNTER — Telehealth: Payer: Self-pay | Admitting: Neurology

## 2017-11-21 DIAGNOSIS — J309 Allergic rhinitis, unspecified: Secondary | ICD-10-CM

## 2017-11-21 NOTE — Telephone Encounter (Signed)
I called the patient.  MRI of the brain was normal.    MRI brain 11/21/17:  IMPRESSION:   Normal MRI brain (without).

## 2017-11-26 ENCOUNTER — Other Ambulatory Visit: Payer: Self-pay

## 2017-11-26 ENCOUNTER — Other Ambulatory Visit: Payer: Self-pay | Admitting: *Deleted

## 2017-11-26 DIAGNOSIS — I10 Essential (primary) hypertension: Secondary | ICD-10-CM

## 2017-11-26 MED ORDER — DULOXETINE HCL 60 MG PO CPEP: 60.0000 mg | ORAL_CAPSULE | Freq: Every day | ORAL | 1 refills | Status: DC

## 2017-11-26 MED ORDER — LOSARTAN POTASSIUM 100 MG PO TABS: ORAL_TABLET | ORAL | 1 refills | Status: DC

## 2017-12-05 ENCOUNTER — Encounter: Payer: Self-pay | Admitting: Gastroenterology

## 2017-12-13 NOTE — Progress Notes (Signed)
MEDICARE ANNUAL WELLNESS VISIT AND FOLLOW UP  Assessment:    Essential hypertension - continue medications, DASH diet, exercise and monitor at home. Call if greater than 130/80.  - CBC with Differential/Platelet - Hepatic function panel - TSH   Obstructive sleep apnea Sleep apnea- continue mouth piece, weight loss advised.   Asthma, unspecified asthma severity, uncomplicated controlled  Gastroesophageal reflux disease without esophagitis Continue PPI/H2 blocker, diet discussed  Liver disease Check labs, avoid tylenol, alcohol, weight loss advised.   Other specified hypothyroidism Hypothyroidism-check TSH level, continue medications the same, reminded to take on an empty stomach 30-70mins before food.   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 - Hemoglobin A1c  CKD stage 2  Increase fluids, avoid NSAIDS, monitor sugars, will monitor - BASIC METABOLIC PANEL WITH GFR  Hyperlipidemia -continue medications, check lipids, decrease fatty foods, increase activity.  - Lipid panel  Medication management - Magnesium   Morbid Obesity (BMI 30-39.9) Obesity with co morbidities- long discussion about weight loss, diet, and exercise  Arthritis RICE, NSAIDS, exercises given, if not better get xray and PT referral or ortho referral.   Diarrhea IBS, controlled   History of colonic polyps Due this year  BMI 38.0-38.9,adult - follow up 3 months for progress monitoring - increase veggies, decrease carbs - long discussion about weight loss, diet, and exercise  Depression, major, recurrent, in remission (HCC) Grief reaction continue melatonin at night  Morbid obesity ([place]) - follow up 3 months for progress  monitoring - increase veggies, decrease carbs - long discussion about weight loss, diet, and exercise  Encounter for Medicare annual wellness exam   Over 30 minutes of exam, counseling, chart review, and critical decision making was performed  Plan:   During the course of the visit the patient was educated and counseled about appropriate screening and preventive services including:    Pneumococcal vaccine   Influenza vaccine  Td vaccine  Screening electrocardiogram  Screening mammography  Bone densitometry screening  Colorectal cancer screening  Diabetes screening  Glaucoma screening  Nutrition counseling   Advanced directives: given info/requested   Subjective:   Grace Barry is a 67 y.o. female who presents for Welcome to medicare visit and 3 month follow up on hypertension, prediabetes, hyperlipidemia, vitamin D def.   Her blood pressure has been controlled at home, she has been off the HCTZ, she is on diovan 320 cut in half due to hypotension and Metoprolol 25mg  XL for palpitations,BP: 130/74  She does workout. She denies chest pain, shortness of breath, dizziness.  She is on cholesterol medication, crestor 20mg  daily and denies myalgias. Her cholesterol is at goal. The cholesterol was:   Lab Results  Component Value Date   CHOL 113 08/28/2017   HDL 37 (L) 08/28/2017   LDLCALC 80 03/28/2017   TRIG 252 (H) 08/28/2017   CHOLHDL 3.1 08/28/2017   She has been working on diet and exercise for Diabetes with diabetic chronic kidney disease, she is on bASA, she is on ACE/ARB, and denies paresthesia of the feet, polydipsia, polyuria and visual disturbances. Last A1C was:  Lab Results  Component Value Date   HGBA1C 6.0 (H) 08/28/2017   Patient is on Vitamin D supplement. Lab Results  Component Value Date   VD25OH 25 08/28/2017   She is on thyroid medication. Her  medication was not changed last visit,.  Lab Results  Component Value Date   TSH 0.49 08/28/2017    She has been more tired recently, she falls asleep easily but wakes up frequently.  She has followed with Dr. Toy Cookey and is on a mouth piece that is helping her significantly with sleep, has sleep study in march, son that passed elliot, no sleep walking, no night terrors recently.  She has history of migraines, she has been following with Dr. Jannifer Franklin for balance and mental fogging and now will switch to Dr. Domingo Cocking, changed some medications which helped.  Son jon, fin grand kid, Engineer, mining.  BMI is Body mass index is 38.56 kg/m., she is working on diet and exercise. She eats 1-2 meals a day. She emotionally and bored eat. She drink 200 oz a day of crystal lite very diluted. She has not been working out.  Wt Readings from Last 3 Encounters:  12/17/17 217 lb 11.2 oz (98.7 kg)  11/13/17 216 lb 8 oz (98.2 kg)  08/28/17 215 lb (97.5 kg)    Medication Review Current Outpatient Medications on File Prior to Visit  Medication Sig  . albuterol (PROAIR HFA) 108 (90 Base) MCG/ACT inhaler Use 2 puffs every four hours as needed for cough or wheeze.  May use 2 puffs 10-20 minutes prior to exercise.  Marland Kitchen albuterol (PROVENTIL) (2.5 MG/3ML) 0.083% nebulizer solution Take 3 mLs (2.5 mg total) by nebulization every 4 (four) hours as needed for wheezing or shortness of breath.  Marland Kitchen aspirin 81 MG tablet Take 81 mg by mouth daily.  Marland Kitchen azelastine (ASTELIN) 0.1 % nasal spray Use 1-2 sprays in each nostril 1-2 times daily as needed for stuffy nose or drainage.  . baclofen (LIORESAL) 10 MG tablet Take 10 mg by mouth 2 (two) times daily as needed for muscle spasms.  Marland Kitchen CALCIUM PO Take 1,200 mg by mouth daily.  . cetirizine (ZYRTEC) 10 MG tablet Take 10 mg by mouth as needed. Reported on 10/20/2015  . Cholecalciferol (VITAMIN D3) 400 units CAPS Take by mouth.  . DULoxetine (CYMBALTA) 60 MG capsule Take 1 capsule (60 mg total) by mouth daily.  Marland Kitchen EPINEPHrine (EPIPEN 2-PAK) 0.3 mg/0.3 mL IJ SOAJ injection Inject 0.3 mg into the  muscle once.  Marland Kitchen estradiol (ESTRACE) 1 MG tablet TAKE ONE-HALF TO ONE TABLET BY MOUTH ONCE DAILY  . fluticasone (FLONASE) 50 MCG/ACT nasal spray Use 1-2 sprays in each nostril 1-2 times daily as needed for stuffy nose or drainage.  Marland Kitchen glucose blood (ONE TOUCH ULTRA TEST) test strip   . INVOKANA 300 MG TABS tablet TAKE 1 TABLET (300 MG TOTAL) BY MOUTH DAILY BEFORE BREAKFAST.  Marland Kitchen Lancets (ONETOUCH ULTRASOFT) lancets   . losartan (COZAAR) 100 MG tablet Take 1 tablet daily for BP and Kidney protection  . Magnesium 250 MG TABS Take 250 mg by mouth 2 (two) times daily.  . metoprolol succinate (TOPROL XL) 50 MG 24 hr tablet Take 1 tablet (50 mg total) by mouth daily. Take with or immediately following a meal.  . Multiple Vitamins-Minerals (MULTIVITAMIN PO) Take 1 tablet by mouth daily.   . Omega-3 Fatty Acids (FISH OIL PO) Take by mouth daily.  Marland Kitchen PRESCRIPTION MEDICATION Inject as directed every 30 (thirty) days. Allergy Shot  . ranitidine (ZANTAC) 300 MG tablet TAKE 1 TABLET TWICE A DAY  . rosuvastatin (CRESTOR) 20 MG tablet TAKE 1 TABLET BY MOUTH EVERYDAY AT BEDTIME  . SYNTHROID 200 MCG tablet TAKE 1 TABLET DAILY ON AN  EMPTY STOMACH FOR 30 MINUTES OR AS DIRECTED.  Marland Kitchen topiramate (TOPAMAX) 50 MG tablet Take 1 tablet (50 mg total) by mouth at bedtime.  . Vitamin D, Ergocalciferol, (DRISDOL) 50000 units CAPS capsule TAKE 1 CAPSULE BY MOUTH 4 DAYS A WEEK FOR VITAMIN D DEFICIENCY  . fluconazole (DIFLUCAN) 150 MG tablet MAY TAKE 1 TABLET WEEKLY FOR YEAST INFECTION IF NEEDED (Patient not taking: Reported on 12/17/2017)  . metFORMIN (GLUCOPHAGE XR) 500 MG 24 hr tablet Take 2 tablets 2 x/ day with food for Diabetes   No current facility-administered medications on file prior to visit.     Current Problems (verified) Patient Active Problem List   Diagnosis Date Noted  . Left ear hearing loss 03/28/2016  . Vitamin D deficiency disease 05/19/2015  . Medication management 05/19/2015  . Migraine 02/19/2015  .  Vertigo 02/19/2015  . History of colonic polyps 01/15/2015  . Morbid obesity ([place]) 07/01/2014  . Chronic kidney disease (CKD), stage II (mild) 07/01/2014  . Hyperlipidemia   . Liver disease   . Arthritis   . Asthma   . Hypothyroidism 12/15/2010  . T2_NIDDM w/CKD (GFR 56 ml/min) 12/15/2010  . Depression, major, recurrent, in remission ([place]) 12/15/2010  . Obstructive sleep apnea 12/15/2010  . Essential hypertension 12/15/2010  . GERD 12/15/2010    Screening Tests Immunization History  Administered Date(s) Administered  . Influenza, High Dose Seasonal PF 08/10/2015, 08/10/2016, 08/28/2017  . Pneumococcal Conjugate-13 07/01/2014  . Pneumococcal Polysaccharide-23 10/09/2002, 05/26/2008, 12/13/2016  . Td 10/09/2002, 10/30/2010  . Tdap 08/10/2015  . Zoster 06/20/2011    Preventative care: Tetanus: 2016 Pneumovax: 2018 Prevnar 13: 2015 Flu vaccine: 2018 Zostavax: 2012  Pap: 2009 remote declines another MGM: 09/2017 at [place]: 2017 osteopenia Colonoscopy: 10/2014 due 2019 precancerous polyps EGD: 10/2014 + gastritis MRI 11/20/2017  Names of Other Physician/Practitioners you currently use: 1. [facility]- here for primary care 2. Dr. Wynetta Emery, [place], eye doctor, last visit 08/2017- getting cataract surgery 3. Dr. Sarajane Jews, dentist, last visit q 6 months 4. Dr. Domingo Cocking, neuro 5. Dr. Toy Cookey, sleep apnea 6. Dr. Deatra Ina [facility] Patient Care Team: Unk Pinto, MD as PCP - General (Internal Medicine)  Allergies Allergies  Allergen Reactions  . Floxin [Ofloxacin] Hives  . Viberzi [Eluxadoline] Hives  . Doxycycline Rash    SURGICAL HISTORY She  has a past surgical history that includes Total knee arthroplasty; Cholecystectomy; Abdominal hysterectomy; Cesarean section; Tonsillectomy; Knee arthroscopy; Carpal tunnel release; Hernia repair (07/24/11); and Joint replacement. FAMILY HISTORY Her family history includes Breast cancer in her  mother; Colon cancer in her son; Esophageal cancer (age of onset: 41) in her son; Hyperlipidemia in her father and sister; Hypertension in her father; Prostate cancer in her father; Stomach cancer in her son. SOCIAL HISTORY She  reports that  has never smoked. she has never used smokeless tobacco. She reports that she drinks alcohol. She reports that she does not use drugs.  MEDICARE WELLNESS OBJECTIVES: Physical activity: Current Exercise Habits: The patient does not participate in regular exercise at present Cardiac risk factors: Cardiac Risk Factors include: advanced age (>101men, >24 women);diabetes mellitus;dyslipidemia;obesity (BMI >30kg/m2);sedentary lifestyle;hypertension Depression/mood screen:   Depression screen [facility] 2/9 12/17/2017  Decreased Interest 0  Down, Depressed, Hopeless 0  PHQ - 2 Score 0  Altered sleeping -  Tired, decreased energy -  Change in appetite -  Feeling bad or failure about yourself  -  Trouble concentrating -  Moving slowly or fidgety/restless -  Suicidal thoughts -  PHQ-9 Score -    ADLs:  In your present state of health, do you have any difficulty performing the following activities: 12/17/2017 03/28/2017  Hearing? N N  Vision? N N  Difficulty concentrating or making decisions? N N  Comment has improved with medication change -  Walking or climbing stairs? Y N  Dressing or bathing? N N  Doing errands, shopping? N N  Some recent data might be hidden     Cognitive Testing  Alert? Yes  Normal Appearance?Yes  Oriented to person? Yes  Place? Yes   Time? Yes  Recall of three objects?  Yes  Can perform simple calculations? Yes  Displays appropriate judgment?Yes  Can read the correct time from a watch face?Yes  EOL planning: Does Patient Have a Medical Advance Directive?: Yes Type of Advance Directive: Healthcare Power of Attorney, Living will Copy of [place] in Chart?: No - copy requested  Review of Systems:  Review of  Systems  Constitutional: Negative.   HENT: Negative for congestion, ear discharge, ear pain, hearing loss, nosebleeds, sore throat and tinnitus.   Eyes: Negative.   Respiratory: Negative.  Negative for stridor.   Cardiovascular: Negative.   Gastrointestinal: Positive for diarrhea. Negative for abdominal pain, blood in stool, constipation, heartburn, melena, nausea and vomiting.  Genitourinary: Negative.   Musculoskeletal: Negative.  Negative for falls.  Skin: Negative.   Neurological: Positive for headaches. Negative for dizziness, tingling, tremors, sensory change, speech change, focal weakness, seizures and loss of consciousness.  Endo/Heme/Allergies: Negative.   Psychiatric/Behavioral: Negative.  Negative for depression.     Objective:   Today's Vitals   12/17/17 1425  BP: 130/74  Pulse: 76  Resp: 16  Temp: 97.8 F (36.6 C)  SpO2: 97%  Weight: 217 lb 11.2 oz (98.7 kg)  Height: 5\' 3"  (1.6 m)  PainSc: 0-No pain   General appearance: alert, no distress, WD/WN,  female HEENT: normocephalic, sclerae anicteric, TMs pearly, nares patent, no discharge or erythema, pharynx normal Oral cavity: MMM, no lesions Neck: supple, no lymphadenopathy, no thyromegaly, no masses Heart: RRR, normal S1, S2, no murmurs Lungs: CTA bilaterally, no wheezes, rhonchi, or rales Abdomen: +bs, soft, non tender, non distended, no masses, no hepatomegaly, no splenomegaly Musculoskeletal: nontender, no swelling, no obvious deformity Extremities: no edema, no cyanosis, no clubbing Pulses: 2+ symmetric, upper and lower extremities, normal cap refill Neurological: alert, oriented x 3, CN2-12 intact, strength normal upper extremities and lower extremities, sensation normal throughout, DTRs 2+ throughout, no cerebellar signs, gait normal Psychiatric: normal affect, behavior normal, pleasant  Breast: defer Gyn: defer Rectal: defer  Medicare Attestation I have personally reviewed: The patient's medical and  social history Their use of alcohol, tobacco or illicit drugs Their current medications and supplements The patient's functional ability including ADLs,fall risks, home safety risks, cognitive, and hearing and visual impairment Diet and physical activities Evidence for depression or mood disorders  The patient's weight, height, BMI, and visual acuity have been recorded in the chart.  I have made referrals, counseling, and provided education to the patient based on review of the above and I have provided the patient with a written personalized care plan for preventive services.     Vicie Mutters, PA-C   12/17/2017

## 2017-12-17 ENCOUNTER — Ambulatory Visit: Payer: Medicare Other | Admitting: Physician Assistant

## 2017-12-17 ENCOUNTER — Encounter: Payer: Self-pay | Admitting: Physician Assistant

## 2017-12-17 VITALS — BP 130/74 | HR 76 | Temp 97.8°F | Resp 16 | Ht 63.0 in | Wt 217.7 lb

## 2017-12-17 DIAGNOSIS — E785 Hyperlipidemia, unspecified: Secondary | ICD-10-CM | POA: Diagnosis not present

## 2017-12-17 DIAGNOSIS — G4733 Obstructive sleep apnea (adult) (pediatric): Secondary | ICD-10-CM

## 2017-12-17 DIAGNOSIS — F3340 Major depressive disorder, recurrent, in remission, unspecified: Secondary | ICD-10-CM | POA: Diagnosis not present

## 2017-12-17 DIAGNOSIS — E039 Hypothyroidism, unspecified: Secondary | ICD-10-CM

## 2017-12-17 DIAGNOSIS — R6889 Other general symptoms and signs: Secondary | ICD-10-CM

## 2017-12-17 DIAGNOSIS — I10 Essential (primary) hypertension: Secondary | ICD-10-CM | POA: Diagnosis not present

## 2017-12-17 DIAGNOSIS — H9192 Unspecified hearing loss, left ear: Secondary | ICD-10-CM

## 2017-12-17 DIAGNOSIS — G43009 Migraine without aura, not intractable, without status migrainosus: Secondary | ICD-10-CM

## 2017-12-17 DIAGNOSIS — Z86010 Personal history of colonic polyps: Secondary | ICD-10-CM

## 2017-12-17 DIAGNOSIS — Z6838 Body mass index (BMI) 38.0-38.9, adult: Secondary | ICD-10-CM

## 2017-12-17 DIAGNOSIS — E559 Vitamin D deficiency, unspecified: Secondary | ICD-10-CM

## 2017-12-17 DIAGNOSIS — J45909 Unspecified asthma, uncomplicated: Secondary | ICD-10-CM

## 2017-12-17 DIAGNOSIS — M1990 Unspecified osteoarthritis, unspecified site: Secondary | ICD-10-CM

## 2017-12-17 DIAGNOSIS — Z0001 Encounter for general adult medical examination with abnormal findings: Secondary | ICD-10-CM | POA: Diagnosis not present

## 2017-12-17 DIAGNOSIS — K769 Liver disease, unspecified: Secondary | ICD-10-CM

## 2017-12-17 DIAGNOSIS — E1129 Type 2 diabetes mellitus with other diabetic kidney complication: Secondary | ICD-10-CM

## 2017-12-17 DIAGNOSIS — K219 Gastro-esophageal reflux disease without esophagitis: Secondary | ICD-10-CM | POA: Diagnosis not present

## 2017-12-17 DIAGNOSIS — Z79899 Other long term (current) drug therapy: Secondary | ICD-10-CM

## 2017-12-17 DIAGNOSIS — N182 Chronic kidney disease, stage 2 (mild): Secondary | ICD-10-CM

## 2017-12-17 DIAGNOSIS — R42 Dizziness and giddiness: Secondary | ICD-10-CM

## 2017-12-17 DIAGNOSIS — Z0000 Encounter for general adult medical examination without abnormal findings: Secondary | ICD-10-CM

## 2017-12-17 NOTE — Patient Instructions (Addendum)
Intermittent fasting is more about strategy than starvation. It's meant to reset your body in different ways, hopefully with fitness and nutrition changes as a result.  Like any big switchover, though, results may vary when it comes down to the individual level. What works for your friends may not work for you, or vice versa. That's why it's helpful to play around with variations on intermittent fasting and healthy habits and find what works best for you.  WHAT IS INTERMITTENT FASTING AND WHY DO IT?  Intermittent fasting doesn't involve specific foods, but rather, a strict schedule regarding when you eat. Also called "time-restricted eating," the tactic has been praised for its contribution to weight loss, improved body composition, and decreased cravings. Preliminary research also suggests it may be beneficial for glucose tolerance, hormone regulation, better muscle mass and lower body fat.  Part of its appeal is the simplicity of the effort. Unlike some other trends, there's no calculations to intermittent fasting.  You simply eat within a certain block of time, usually a window of 8-10 hours. In the other big block of time - about 14-16 hours, including when you're asleep - you don't eat anything, not even snacks. You can drink water, coffee, tea or any other beverage that doesn't have calories.  For example, if you like having a late dinner, you might skip breakfast and have your first meal at noon and your last meal of the day at 8 p.m., and then not eat until noon again the next day.  IDEAS FOR GETTING STARTED  If you're new to the strategy, it may be helpful to eat within the typical circadian rhythm and keep eating within daylight hours. This can be especially beneficial if you're looking at intermittent fasting for weight-loss goals.  So first try only eating between 12pm to 8pm.  Outside of this time you may have water, black coffee, and hot tea. You may not eat it drink anything  that has carbs, sugars, OR artificial sugars like diet soda.   Like any major eating and fitness shift, it can take time to find the perfect fit, so don't be afraid to experiment with different options - including ditching intermittent fasting altogether if it's simply not for you. But if it is, you may be surprised by some of the benefits that come along with the strategy.  Diet soda leads to weight gain.  We recently discovered that the artifical sugar in the soda stops an enzyme in your stomach that is suppose to signal that your brain is full. So patients that drink a lot of diet soda will never feel full and tend to over eat. So please cut back on diet soda and it can help with your weight loss.   Are you an emotional eater? Do you eat more when you're feeling stressed? Do you eat when you're not hungry or when you're full? Do you eat to feel better (to calm and soothe yourself when you're sad, mad, bored, anxious, etc.)? Do you reward yourself with food? Do you regularly eat until you've stuffed yourself? Does food make you feel safe? Do you feel like food is a friend? Do you feel powerless or out of control around food?  If you answered yes to some of these questions than it is likely that you are an emotional eater. This is normally a learned behavior and can take time to first recognize the signs and second BREAK THE HABIT. But here is more information and tips to  help.   The difference between emotional hunger and physical hunger Emotional hunger can be powerful, so it's easy to mistake it for physical hunger. But there are clues you can look for to help you tell physical and emotional hunger apart.  Emotional hunger comes on suddenly. It hits you in an instant and feels overwhelming and urgent. Physical hunger, on the other hand, comes on more gradually. The urge to eat doesn't feel as dire or demand instant satisfaction (unless you haven't eaten for a very long time).  Emotional  hunger craves specific comfort foods. When you're physically hungry, almost anything sounds good-including healthy stuff like vegetables. But emotional hunger craves junk food or sugary snacks that provide an instant rush. You feel like you need cheesecake or pizza, and nothing else will do.  Emotional hunger often leads to mindless eating. Before you know it, you've eaten a whole bag of chips or an entire pint of ice cream without really paying attention or fully enjoying it. When you're eating in response to physical hunger, you're typically more aware of what you're doing.  Emotional hunger isn't satisfied once you're full. You keep wanting more and more, often eating until you're uncomfortably stuffed. Physical hunger, on the other hand, doesn't need to be stuffed. You feel satisfied when your stomach is full.  Emotional hunger isn't located in the stomach. Rather than a growling belly or a pang in your stomach, you feel your hunger as a craving you can't get out of your head. You're focused on specific textures, tastes, and smells.  Emotional hunger often leads to regret, guilt, or shame. When you eat to satisfy physical hunger, you're unlikely to feel guilty or ashamed because you're simply giving your body what it needs. If you feel guilty after you eat, it's likely because you know deep down that you're not eating for nutritional reasons.  Identify your emotional eating triggers What situations, places, or feelings make you reach for the comfort of food? Most emotional eating is linked to unpleasant feelings, but it can also be triggered by positive emotions, such as rewarding yourself for achieving a goal or celebrating a holiday or happy event. Common causes of emotional eating include:  Stuffing emotions - Eating can be a way to temporarily silence or "stuff down" uncomfortable emotions, including anger, fear, sadness, anxiety, loneliness, resentment, and shame. While you're numbing yourself  with food, you can avoid the difficult emotions you'd rather not feel.  Boredom or feelings of emptiness - Do you ever eat simply to give yourself something to do, to relieve boredom, or as a way to fill a void in your life? You feel unfulfilled and empty, and food is a way to occupy your mouth and your time. In the moment, it fills you up and distracts you from underlying feelings of purposelessness and dissatisfaction with your life.  Childhood habits - Think back to your childhood memories of food. Did your parents reward good behavior with ice cream, take you out for pizza when you got a good report card, or serve you sweets when you were feeling sad? These habits can often carry over into adulthood. Or your eating may be driven by nostalgia-for cherished memories of grilling burgers in the backyard with your dad or baking and eating cookies with your mom.  Social influences - Getting together with other people for a meal is a great way to relieve stress, but it can also lead to overeating. It's easy to overindulge simply because the  food is there or because everyone else is eating. You may also overeat in social situations out of nervousness. Or perhaps your family or circle of friends encourages you to overeat, and it's easier to go along with the group.  Stress - Ever notice how stress makes you hungry? It's not just in your mind. When stress is chronic, as it so often is in our chaotic, fast-paced world, your body produces high levels of the stress hormone, cortisol. Cortisol triggers cravings for salty, sweet, and fried foods-foods that give you a burst of energy and pleasure. The more uncontrolled stress in your life, the more likely you are to turn to food for emotional relief.  Find other ways to feed your feelings If you don't know how to manage your emotions in a way that doesn't involve food, you won't be able to control your eating habits for very long. Diets so often fail because they  offer logical nutritional advice which only works if you have conscious control over your eating habits. It doesn't work when emotions hijack the process, demanding an immediate payoff with food.  In order to stop emotional eating, you have to find other ways to fulfill yourself emotionally. It's not enough to understand the cycle of emotional eating or even to understand your triggers, although that's a huge first step. You need alternatives to food that you can turn to for emotional fulfillment.  Alternatives to emotional eating If you're depressed or lonely, call someone who always makes you feel better, play with your dog or cat, or look at a favorite photo or cherished memento.  If you're anxious, expend your nervous energy by dancing to your favorite song, squeezing a stress ball, or taking a brisk walk.  If you're exhausted, treat yourself with a hot cup of tea, take a bath, light some scented candles, or wrap yourself in a warm blanket.  If you're bored, read a good book, watch a comedy show, explore the outdoors, or turn to an activity you enjoy (woodworking, playing the guitar, shooting hoops, scrapbooking, etc.).  What is mindful eating? Mindful eating is a practice that develops your awareness of eating habits and allows you to pause between your triggers and your actions. Most emotional eaters feel powerless over their food cravings. When the urge to eat hits, you feel an almost unbearable tension that demands to be fed, right now. Because you've tried to resist in the past and failed, you believe that your willpower just isn't up to snuff. But the truth is that you have more power over your cravings than you think.  Take 5 before you give in to a craving Emotional eating tends to be automatic and virtually mindless. Before you even realize what you're doing, you've reached for a tub of ice cream and polished off half of it. But if you can take a moment to pause and reflect when you're  hit with a craving, you give yourself the opportunity to make a different decision.  Can you put off eating for five minutes? Or just start with one minute. Don't tell yourself you can't give in to the craving; remember, the forbidden is extremely tempting. Just tell yourself to wait.  While you're waiting, check in with yourself. How are you feeling? What's going on emotionally? Even if you end up eating, you'll have a better understanding of why you did it. This can help you set yourself up for a different response next time.  How to practice mindful eating  Eating while you're also doing other things-such as watching TV, driving, or playing with your phone-can prevent you from fully enjoying your food. Since your mind is elsewhere, you may not feel satisfied or continue eating even though you're no longer hungry. Eating more mindfully can help focus your mind on your food and the pleasure of a meal and curb overeating.   Eat your meals in a calm place with no distractions, aside from any dining companions.  Try eating with your non-dominant hand or using chopsticks instead of a knife and fork. Eating in such a non-familiar way can slow down how fast you eat and ensure your mind stays focused on your food.  Allow yourself enough time not to have to rush your meal. Set a timer for 20 minutes and pace yourself so you spend at least that much time eating.  Take small bites and chew them well, taking time to notice the different flavors and textures of each mouthful.  Put your utensils down between bites. Take time to consider how you feel-hungry, satiated-before picking up your utensils again.  Try to stop eating before you are full.It takes time for the signal to reach your brain that you've had enough. Don't feel obligated to always clean your plate.  When you've finished your food, take a few moments to assess if you're really still hungry before opting for an extra serving or  dessert.  Learn to accept your feelings-even the bad ones  While it may seem that the core problem is that you're powerless over food, emotional eating actually stems from feeling powerless over your emotions. You don't feel capable of dealing with your feelings head on, so you avoid them with food.  Recommended reading  Mini Habits for weight loss- really like this book  Healthy Eating: A guide to the new nutrition - [place] Report  10 Tips for Mindful Eating - How mindfulness can help you fully enjoy a meal and the experience of eating-with moderation and restraint. ([place])  Weight Loss: Gain Control of Emotional Eating - Tips to regain control of your eating habits. [facility])  Why Stress Causes People to Overeat -Tips on controlling stress eating. ([place])  Mindful Eating Meditations -Free online mindfulness meditations. ([facility])     Dining out: help on how to choose  It is better if you can meal plan and eat at your house but sometimes life happens so here are some tips to help you make healthier choices while eating out:  1) Ask for your server to pack up 1/2 of your meal to take home for lunch or later. Portion sizes are huge so if you don't have all of it in front of you, you are less likely to eat it all.    2) Ask if they have a smaller portion or lunch portion available, this is often cheaper as well!  3) Ask to not have the bread basket brought to the table  4) Ask for the dressing on the side.   5) Look at the nutrition menu BEFORE you go to a restaurant or fast food chain and have what you will eat in mind. You can also look it up on food tracking ups like Myfitness Pal or Lost it. However the web site for the restaurant is usually the best bet.   6) Don't forget that you are the costumer, it is okay to ask them to leave things  out, ask about substituting, or ask them to cook  with less oil!  Here is some more general information for you!

## 2017-12-18 LAB — HEPATIC FUNCTION PANEL
AG Ratio: 1.8 (calc) (ref 1.0–2.5)
ALBUMIN MSPROF: 4.4 g/dL (ref 3.6–5.1)
ALT: 22 U/L (ref 6–29)
AST: 20 U/L (ref 10–35)
Alkaline phosphatase (APISO): 69 U/L (ref 33–130)
Bilirubin, Direct: 0.1 mg/dL (ref 0.0–0.2)
GLOBULIN: 2.4 g/dL (ref 1.9–3.7)
Indirect Bilirubin: 0.5 mg/dL (calc) (ref 0.2–1.2)
TOTAL PROTEIN: 6.8 g/dL (ref 6.1–8.1)
Total Bilirubin: 0.6 mg/dL (ref 0.2–1.2)

## 2017-12-18 LAB — BASIC METABOLIC PANEL WITH GFR
BUN/Creatinine Ratio: 12 (calc) (ref 6–22)
BUN: 12 mg/dL (ref 7–25)
CALCIUM: 9.8 mg/dL (ref 8.6–10.4)
CHLORIDE: 105 mmol/L (ref 98–110)
CO2: 28 mmol/L (ref 20–32)
CREATININE: 1.03 mg/dL — AB (ref 0.50–0.99)
GFR, Est African American: 65 mL/min/{1.73_m2} (ref >=60)
GFR, Est Non African American: 56 mL/min/{1.73_m2} — ABNORMAL LOW (ref >=60)
GLUCOSE: 92 mg/dL (ref 65–99)
Potassium: 4.9 mmol/L (ref 3.5–5.3)
Sodium: 140 mmol/L (ref 135–146)

## 2017-12-18 LAB — CBC WITH DIFFERENTIAL/PLATELET
BASOS PCT: 0.5 %
Basophils Absolute: 47 cells/uL (ref 0–200)
EOS PCT: 1.8 %
Eosinophils Absolute: 169 cells/uL (ref 15–500)
HCT: 42.9 % (ref 35.0–45.0)
HEMOGLOBIN: 14.9 g/dL (ref 11.7–15.5)
Lymphs Abs: 3093 cells/uL (ref 850–3900)
MCH: 29.7 pg (ref 27.0–33.0)
MCHC: 34.7 g/dL (ref 32.0–36.0)
MCV: 85.5 fL (ref 80.0–100.0)
MONOS PCT: 7.3 %
MPV: 10.7 fL (ref 7.5–12.5)
NEUTROS ABS: 5405 {cells}/uL (ref 1500–7800)
Neutrophils Relative %: 57.5 %
PLATELETS: 283 10*3/uL (ref 140–400)
RBC: 5.02 10*6/uL (ref 3.80–5.10)
RDW: 13 % (ref 11.0–15.0)
TOTAL LYMPHOCYTE: 32.9 %
WBC mixed population: 686 cells/uL (ref 200–950)
WBC: 9.4 10*3/uL (ref 3.8–10.8)

## 2017-12-18 LAB — LIPID PANEL
CHOL/HDL RATIO: 3.1 (calc) (ref <5.0)
CHOLESTEROL: 123 mg/dL (ref <200)
HDL: 40 mg/dL — AB (ref >50)
LDL CHOLESTEROL (CALC): 56 mg/dL
Non-HDL Cholesterol (Calc): 83 mg/dL (calc) (ref <130)
TRIGLYCERIDES: 209 mg/dL — AB (ref <150)

## 2017-12-18 LAB — HEMOGLOBIN A1C
EAG (MMOL/L): 7.6 (calc)
Hgb A1c MFr Bld: 6.4 % of total Hgb — ABNORMAL HIGH (ref <5.7)
Mean Plasma Glucose: 137 (calc)

## 2017-12-18 LAB — MAGNESIUM: Magnesium: 2.2 mg/dL (ref 1.5–2.5)

## 2017-12-18 LAB — TSH: TSH: 3.66 mIU/L (ref 0.40–4.50)

## 2017-12-24 ENCOUNTER — Other Ambulatory Visit: Payer: Self-pay | Admitting: Physician Assistant

## 2017-12-26 ENCOUNTER — Ambulatory Visit (INDEPENDENT_AMBULATORY_CARE_PROVIDER_SITE_OTHER): Payer: Medicare Other

## 2017-12-26 DIAGNOSIS — J309 Allergic rhinitis, unspecified: Secondary | ICD-10-CM | POA: Diagnosis not present

## 2017-12-31 DIAGNOSIS — H9042 Sensorineural hearing loss, unilateral, left ear, with unrestricted hearing on the contralateral side: Secondary | ICD-10-CM | POA: Insufficient documentation

## 2017-12-31 HISTORY — DX: Sensorineural hearing loss, unilateral, left ear, with unrestricted hearing on the contralateral side: H90.42

## 2018-01-07 ENCOUNTER — Encounter: Payer: Self-pay | Admitting: Physician Assistant

## 2018-01-07 MED ORDER — VITAMIN D (ERGOCALCIFEROL) 1.25 MG (50000 UNIT) PO CAPS: ORAL_CAPSULE | ORAL | 0 refills | Status: DC

## 2018-01-28 ENCOUNTER — Ambulatory Visit (INDEPENDENT_AMBULATORY_CARE_PROVIDER_SITE_OTHER): Payer: Medicare Other | Admitting: *Deleted

## 2018-01-28 DIAGNOSIS — J309 Allergic rhinitis, unspecified: Secondary | ICD-10-CM

## 2018-02-06 ENCOUNTER — Other Ambulatory Visit: Payer: Self-pay | Admitting: Neurology

## 2018-02-07 ENCOUNTER — Other Ambulatory Visit: Payer: Self-pay | Admitting: Neurology

## 2018-02-11 ENCOUNTER — Other Ambulatory Visit: Payer: Self-pay | Admitting: Physician Assistant

## 2018-02-25 ENCOUNTER — Ambulatory Visit (INDEPENDENT_AMBULATORY_CARE_PROVIDER_SITE_OTHER): Payer: Medicare Other | Admitting: *Deleted

## 2018-02-25 DIAGNOSIS — J309 Allergic rhinitis, unspecified: Secondary | ICD-10-CM

## 2018-03-04 ENCOUNTER — Ambulatory Visit (INDEPENDENT_AMBULATORY_CARE_PROVIDER_SITE_OTHER): Payer: Medicare Other | Admitting: *Deleted

## 2018-03-04 DIAGNOSIS — J309 Allergic rhinitis, unspecified: Secondary | ICD-10-CM | POA: Diagnosis not present

## 2018-03-13 ENCOUNTER — Ambulatory Visit: Payer: Medicare Other | Admitting: Neurology

## 2018-03-13 ENCOUNTER — Ambulatory Visit (INDEPENDENT_AMBULATORY_CARE_PROVIDER_SITE_OTHER): Payer: Medicare Other | Admitting: *Deleted

## 2018-03-13 ENCOUNTER — Encounter: Payer: Self-pay | Admitting: Neurology

## 2018-03-13 VITALS — BP 151/94 | HR 87 | Ht 63.0 in | Wt 221.5 lb

## 2018-03-13 DIAGNOSIS — J309 Allergic rhinitis, unspecified: Secondary | ICD-10-CM

## 2018-03-13 DIAGNOSIS — G43009 Migraine without aura, not intractable, without status migrainosus: Secondary | ICD-10-CM

## 2018-03-13 MED ORDER — TOPIRAMATE 50 MG PO TABS: ORAL_TABLET | ORAL | 3 refills | Status: DC

## 2018-03-13 MED ORDER — METOPROLOL SUCCINATE ER 50 MG PO TB24: 50.0000 mg | ORAL_TABLET | Freq: Every day | ORAL | 3 refills | Status: DC

## 2018-03-13 NOTE — Progress Notes (Signed)
Reason for visit: Migraine headache  Grace Barry is an 67 y.o. female  History of present illness:  Grace Barry is a 67 year old right-handed white female with a history of obesity and sleep apnea who has a history of migraine headache.  The patient has had reports of memory problems on a higher dose of the Topamax, the Topamax was reduced to 50 mg daily and the metoprolol was increased.  The headaches have leveled off to having about 1 month, the headaches are easy to control, the patient will take baclofen if needed.  The patient has chronic issues with feeling tired and drowsy during the day, she uses a sleep appliance at night.  She is followed through a sleep physician.  The patient has had a dramatic improvement in her memory and with her ability to concentrate.  She is quite pleased with her current therapy.  Past Medical History:  Diagnosis Date  . Allergy   . Anxiety   . Arthritis   . Asthma   . Depression   . Fibromyalgia   . GERD (gastroesophageal reflux disease)   . Hyperlipidemia   . Hypertension   . Liver disease   . Migraine 02/19/2015  . Migraines   . Obesity   . Sleep apnea   . Sleep difficulties   . Thyroid disease    hypothyroidism  . Type II or unspecified type diabetes mellitus without mention of complication, not stated as uncontrolled   . Ventral hernia   . Vertigo 02/19/2015    Past Surgical History:  Procedure Laterality Date  . ABDOMINAL HYSTERECTOMY    . CARPAL TUNNEL RELEASE     bilateral  . CESAREAN SECTION     x2  . CHOLECYSTECTOMY    . HERNIA REPAIR  07/24/11   ventral hernia  . JOINT REPLACEMENT    . KNEE ARTHROSCOPY     bilateral  . TONSILLECTOMY    . TOTAL KNEE ARTHROPLASTY     bilateral    Family History  Problem Relation Age of Onset  . Breast cancer Mother   . Hypertension Father   . Hyperlipidemia Father   . Prostate cancer Father   . Hyperlipidemia Sister   . Esophageal cancer Son 21       Died 2013/12/06  . Colon  cancer Son   . Stomach cancer Son   . Colon polyps Neg Hx   . Diabetes Neg Hx   . Kidney disease Neg Hx   . Migraines Neg Hx     Social history:  reports that she has never smoked. She has never used smokeless tobacco. She reports that she drinks alcohol. She reports that she does not use drugs.    Allergies  Allergen Reactions  . Floxin [Ofloxacin] Hives  . Viberzi [Eluxadoline] Hives  . Doxycycline Rash    Medications:  Prior to Admission medications   Medication Sig Start Date End Date Taking? Authorizing Provider  albuterol (PROAIR HFA) 108 (90 Base) MCG/ACT inhaler Use 2 puffs every four hours as needed for cough or wheeze.  May use 2 puffs 10-20 minutes prior to exercise. 05/29/16  Yes Valentina Shaggy, MD  albuterol (PROVENTIL) (2.5 MG/3ML) 0.083% nebulizer solution Take 3 mLs (2.5 mg total) by nebulization every 4 (four) hours as needed for wheezing or shortness of breath. 11/02/16  Yes Forcucci, Courtney, PA-C  aspirin 81 MG tablet Take 81 mg by mouth daily.   Yes [provider]  azelastine (ASTELIN) 0.1 %  nasal spray Use 1-2 sprays in each nostril 1-2 times daily as needed for stuffy nose or drainage. 05/29/16  Yes Valentina Shaggy, MD  baclofen (LIORESAL) 10 MG tablet Take 10 mg by mouth 2 (two) times daily as needed for muscle spasms.   Yes [provider]  CALCIUM PO Take 1,200 mg by mouth daily.   Yes [provider]  cetirizine (ZYRTEC) 10 MG tablet Take 10 mg by mouth as needed. Reported on 10/20/2015   Yes [provider]  Cholecalciferol (VITAMIN D3) 400 units CAPS Take by mouth.   Yes [provider]  DULoxetine (CYMBALTA) 60 MG capsule Take 1 capsule (60 mg total) by mouth daily. 11/26/17  Yes Vicie Mutters, PA-C  EPINEPHrine (EPIPEN 2-PAK) 0.3 mg/0.3 mL IJ SOAJ injection Inject 0.3 mg into the muscle once.   Yes [provider]  estradiol (ESTRACE) 1 MG tablet TAKE ONE-HALF TO ONE TABLET BY MOUTH ONCE  DAILY 01/12/17  Yes Unk Pinto, MD  fluconazole (DIFLUCAN) 150 MG tablet MAY TAKE 1 TABLET WEEKLY FOR YEAST INFECTION IF NEEDED 03/19/17  Yes Unk Pinto, MD  fluticasone [facility]) 50 MCG/ACT nasal spray Use 1-2 sprays in each nostril 1-2 times daily as needed for stuffy nose or drainage. 05/29/16  Yes Valentina Shaggy, MD  glucose blood (ONE TOUCH ULTRA TEST) test strip  08/10/15  Yes [provider]  INVOKANA 300 MG TABS tablet TAKE 1 TABLET (300 MG TOTAL) BY MOUTH DAILY BEFORE BREAKFAST. 12/24/17  Yes Unk Pinto, MD  Lancets [facility] ULTRASOFT) lancets  08/10/15  Yes [provider]  losartan (COZAAR) 100 MG tablet Take 1 tablet daily for BP and Kidney protection 11/26/17 06/26/18 Yes Unk Pinto, MD  Magnesium 250 MG TABS Take 250 mg by mouth 2 (two) times daily.   Yes [provider]  metoprolol succinate (TOPROL-XL) 50 MG 24 hr tablet Take 1 tablet (50 mg total) by mouth daily. Take with or immediately following a meal. 03/13/18  Yes Kathrynn Ducking, MD  Multiple Vitamins-Minerals (MULTIVITAMIN PO) Take 1 tablet by mouth daily.    Yes [provider]  Omega-3 Fatty Acids (FISH OIL PO) Take by mouth daily.   Yes [provider]  PRESCRIPTION MEDICATION Inject as directed every 30 (thirty) days. Allergy Shot   Yes [provider]  ranitidine (ZANTAC) 300 MG tablet TAKE 1 TABLET TWICE A DAY 08/29/17  Yes Vicie Mutters, PA-C  rosuvastatin (CRESTOR) 20 MG tablet TAKE 1 TABLET BY MOUTH EVERYDAY AT BEDTIME 11/19/17  Yes Vicie Mutters, PA-C  SYNTHROID 200 MCG tablet TAKE 1 TABLET DAILY ON AN EMPTY STOMACH FOR 30 MINUTES OR AS DIRECTED. 02/11/18  Yes Liane Comber, NP  topiramate (TOPAMAX) 50 MG tablet TAKE 1 TABLET BY MOUTH EVERYDAY AT BEDTIME 03/13/18  Yes Kathrynn Ducking, MD  Vitamin D, Ergocalciferol, (DRISDOL) 50000 units CAPS capsule TAKE 1 CAPSULE BY MOUTH 4 DAYS A WEEK FOR VITAMIN D DEFICIENCY 01/07/18  Yes Vicie Mutters, PA-C  metFORMIN (GLUCOPHAGE XR) 500 MG 24 hr tablet Take 2 tablets 2 x/ day with food for Diabetes 08/28/17 11/28/17  Vicie Mutters, PA-C    ROS:  Out of a complete 14 system review of symptoms, the patient complains only of the following symptoms, and all other reviewed systems are negative.  Fatigue Ringing in the ears Light sensitivity, blurred vision Heat intolerance, excessive thirst Sleep apnea, frequent waking, daytime sleepiness Environmental allergies Frequency of urination, urinary urgency Headache Balance issues  Blood pressure Marland Kitchen)  151/94, pulse 87, height 5\' 3"  (1.6 m), weight 221 lb 8 oz (100.5 kg).  Physical Exam  General: The patient is alert and cooperative at the time of the examination.  The patient is markedly obese.  Skin: No significant peripheral edema is noted.   Neurologic Exam  Mental status: The patient is alert and oriented x 3 at the time of the examination. The patient has apparent normal recent and remote memory, with an apparently normal attention span and concentration ability.   Cranial nerves: Facial symmetry is present. Speech is normal, no aphasia or dysarthria is noted. Extraocular movements are full. Visual fields are full.  Motor: The patient has good strength in all 4 extremities.  Sensory examination: Soft touch sensation is symmetric on the face, arms, and legs.  Coordination: The patient has good finger-nose-finger and heel-to-shin bilaterally.  Gait and station: The patient has a normal gait. Tandem gait is very minimally unsteady. Romberg is negative. No drift is seen.  Reflexes: Deep tendon reflexes are symmetric.   Assessment/Plan:  1.  Migraine headache  The problems with the memory have improved on a lower dose of Topamax.  The patient is controlling her headaches quite well, she is able to manage the headaches when they do occur.  A prescription was sent in for the Topamax and for the metoprolol, she will  follow-up in 6 months, if she continues to do well we will follow her on an annual basis from there.  Jill Alexanders MD 03/13/2018 11:46 AM  [facility] [address] [place], [place] [address]  Phone [phone] Fax [phone]

## 2018-03-24 ENCOUNTER — Other Ambulatory Visit: Payer: Self-pay | Admitting: Physician Assistant

## 2018-04-01 ENCOUNTER — Ambulatory Visit (INDEPENDENT_AMBULATORY_CARE_PROVIDER_SITE_OTHER): Payer: Medicare Other

## 2018-04-01 DIAGNOSIS — J309 Allergic rhinitis, unspecified: Secondary | ICD-10-CM

## 2018-04-01 NOTE — Progress Notes (Signed)
This very nice 67 y.o. MWF presents for 3 month follow up with HTN, HLD, Pre-Diabetes and Vitamin D Deficiency. Patient has hx/o OSA on CPAP from 2006 thru 2016 switching to an Oral Appliance by Dr Augustina Mood. Her GERD is controlled on her meds.     Patient is treated for HTN (2009) & BP has been controlled at home. Today's BP is at goal - 122/76. Patient has had no complaints of any cardiac type chest pain, palpitations, dyspnea / orthopnea / PND, dizziness, claudication, or dependent edema.     Hyperlipidemia is controlled with diet & meds. Patient denies myalgias or other med SE's. Last Lipids were at goal albeit elevated Trig's: Lab Results  Component Value Date   CHOL 123 12/17/2017   HDL 40 (L) 12/17/2017   LDLCALC 56 12/17/2017   TRIG 209 (H) 12/17/2017   CHOLHDL 3.1 12/17/2017      Also, the patient has history of  Morbid Obesity (BMI 36+) and T2_NIDDM (2006) and has had no symptoms of reactive hypoglycemia, diabetic polys, paresthesias or visual blurring. She admits infrequent monitoring of CBG's and last A1c was not at goal: Lab Results  Component Value Date   HGBA1C 6.4 (H) 12/17/2017      Patient has been on Thyroid  Replacement since 2008.     Further, the patient also has history of Vitamin D Deficiency and supplements vitamin D without any suspected side-effects. Last vitamin D was at goal: Lab Results  Component Value Date   VD25OH 86 08/28/2017   Current Outpatient Medications on File Prior to Visit  Medication Sig  . albuterol (PROAIR HFA) 108 (90 Base) MCG/ACT inhaler Use 2 puffs every four hours as needed for cough or wheeze.  May use 2 puffs 10-20 minutes prior to exercise.  Marland Kitchen albuterol (PROVENTIL) (2.5 MG/3ML) 0.083% nebulizer solution Take 3 mLs (2.5 mg total) by nebulization every 4 (four) hours as needed for wheezing or shortness of breath.  Marland Kitchen aspirin 81 MG tablet Take 81 mg by mouth daily.  Marland Kitchen azelastine (ASTELIN) 0.1 % nasal spray Use 1-2 sprays in  each nostril 1-2 times daily as needed for stuffy nose or drainage.  . baclofen (LIORESAL) 10 MG tablet Take 10 mg by mouth 2 (two) times daily as needed for muscle spasms.  Marland Kitchen CALCIUM PO Take 1,200 mg by mouth daily.  . cetirizine (ZYRTEC) 10 MG tablet Take 10 mg by mouth as needed. Reported on 10/20/2015  . DULoxetine (CYMBALTA) 60 MG capsule Take 1 capsule (60 mg total) by mouth daily.  Marland Kitchen EPINEPHrine (EPIPEN 2-PAK) 0.3 mg/0.3 mL IJ SOAJ injection Inject 0.3 mg into the muscle once.  Marland Kitchen estradiol (ESTRACE) 1 MG tablet TAKE ONE-HALF TO ONE TABLET BY MOUTH ONCE DAILY  . fluconazole (DIFLUCAN) 150 MG tablet MAY TAKE 1 TABLET WEEKLY FOR YEAST INFECTION IF NEEDED  . fluticasone (FLONASE) 50 MCG/ACT nasal spray Use 1-2 sprays in each nostril 1-2 times daily as needed for stuffy nose or drainage.  Marland Kitchen glucose blood (ONE TOUCH ULTRA TEST) test strip   . INVOKANA 300 MG TABS tablet TAKE 1 TABLET (300 MG TOTAL) BY MOUTH DAILY BEFORE BREAKFAST.  Marland Kitchen Lancets (ONETOUCH ULTRASOFT) lancets   . losartan (COZAAR) 100 MG tablet Take 1 tablet daily for BP and Kidney protection  . Magnesium 250 MG TABS Take 250 mg by mouth 2 (two) times daily.  . metoprolol succinate (TOPROL-XL) 50 MG 24 hr tablet Take 1 tablet (50 mg total) by  mouth daily. Take with or immediately following a meal.  . Multiple Vitamins-Minerals (MULTIVITAMIN PO) Take 1 tablet by mouth daily.   . Omega-3 Fatty Acids (FISH OIL PO) Take by mouth daily.  Marland Kitchen PRESCRIPTION MEDICATION Inject as directed every 30 (thirty) days. Allergy Shot  . ranitidine (ZANTAC) 300 MG tablet TAKE 1 TABLET TWICE A DAY  . rosuvastatin (CRESTOR) 20 MG tablet TAKE 1 TABLET BY MOUTH EVERYDAY AT BEDTIME  . SYNTHROID 200 MCG tablet TAKE 1 TABLET DAILY ON AN EMPTY STOMACH FOR 30 MINUTES OR AS DIRECTED.  Marland Kitchen topiramate (TOPAMAX) 50 MG tablet TAKE 1 TABLET BY MOUTH EVERYDAY AT BEDTIME  . Vitamin D, Ergocalciferol, (DRISDOL) 50000 units CAPS capsule TAKE 1 CAPSULE BY MOUTH 4 DAYS A WEEK  FOR VITAMIN D DEFICIENCY  . metFORMIN (GLUCOPHAGE XR) 500 MG 24 hr tablet Take 2 tablets 2 x/ day with food for Diabetes   No current facility-administered medications on file prior to visit.    Allergies  Allergen Reactions  . Floxin [Ofloxacin] Hives  . Viberzi [Eluxadoline] Hives  . Doxycycline Rash   PMHx:   Past Medical History:  Diagnosis Date  . Allergy   . Anxiety   . Arthritis   . Asthma   . Depression   . Fibromyalgia   . GERD (gastroesophageal reflux disease)   . Hyperlipidemia   . Hypertension   . Liver disease   . Migraine 02/19/2015  . Migraines   . Obesity   . Sleep apnea   . Sleep difficulties   . Thyroid disease    hypothyroidism  . Type II or unspecified type diabetes mellitus without mention of complication, not stated as uncontrolled   . Ventral hernia   . Vertigo 02/19/2015   Immunization History  Administered Date(s) Administered  . Influenza, High Dose Seasonal PF 08/10/2015, 08/10/2016, 08/28/2017  . Pneumococcal Conjugate-13 07/01/2014  . Pneumococcal Polysaccharide-23 10/09/2002, 05/26/2008, 12/13/2016  . Td 10/09/2002, 10/30/2010  . Tdap 08/10/2015  . Zoster 06/20/2011   Past Surgical History:  Procedure Laterality Date  . ABDOMINAL HYSTERECTOMY    . CARPAL TUNNEL RELEASE     bilateral  . CESAREAN SECTION     x2  . CHOLECYSTECTOMY    . HERNIA REPAIR  07/24/11   ventral hernia  . JOINT REPLACEMENT    . KNEE ARTHROSCOPY     bilateral  . TONSILLECTOMY    . TOTAL KNEE ARTHROPLASTY     bilateral   FHx:    Reviewed / unchanged  SHx:    Reviewed / unchanged   Systems Review:  Constitutional: Denies fever, chills, wt changes, headaches, insomnia, fatigue, night sweats, change in appetite. Eyes: Denies redness, blurred vision, diplopia, discharge, itchy, watery eyes.  ENT: Denies discharge, congestion, post nasal drip, epistaxis, sore throat, earache, hearing loss, dental pain, tinnitus, vertigo, sinus pain, snoring.  CV: Denies  chest pain, palpitations, irregular heartbeat, syncope, dyspnea, diaphoresis, orthopnea, PND, claudication or edema. Respiratory: denies cough, dyspnea, DOE, pleurisy, hoarseness, laryngitis, wheezing.  Gastrointestinal: Denies dysphagia, odynophagia, heartburn, reflux, water brash, abdominal pain or cramps, nausea, vomiting, bloating, diarrhea, constipation, hematemesis, melena, hematochezia  or hemorrhoids. Genitourinary: Denies dysuria, frequency, urgency, nocturia, hesitancy, discharge, hematuria or flank pain. Musculoskeletal: Denies arthralgias, myalgias, stiffness, jt. swelling, pain, limping or strain/sprain.  Skin: Denies pruritus, rash, hives, warts, acne, eczema or change in skin lesion(s). Neuro: No weakness, tremor, incoordination, spasms, paresthesia or pain. Psychiatric: Denies confusion, memory loss or sensory loss. Endo: Denies change in weight, skin or hair change.  Heme/Lymph: No excessive bleeding, bruising or enlarged lymph nodes.  Physical Exam  BP 122/76   Pulse 92   Temp (!) 97.3 F (36.3 C)   Resp 16   Ht 5\' 3"  (1.6 m)   Wt 219 lb 6.4 oz (99.5 kg)   BMI 38.86 kg/m   Appears  well nourished, well groomed  and in no distress.  Eyes: PERRLA, EOMs, conjunctiva no swelling or erythema. Sinuses: No frontal/maxillary tenderness ENT/Mouth: EAC's clear, TM's nl w/o erythema, bulging. Nares clear w/o erythema, swelling, exudates. Oropharynx clear without erythema or exudates. Oral hygiene is good. Tongue normal, non obstructing. Hearing intact.  Neck: Supple. Thyroid not palpable. Car 2+/2+ without bruits, nodes or JVD. Chest: Respirations nl with BS clear & equal w/o rales, rhonchi, wheezing or stridor.  Cor: Heart sounds normal w/ regular rate and rhythm without sig. murmurs, gallops, clicks or rubs. Peripheral pulses normal and equal  without edema.  Abdomen: Soft & bowel sounds normal. Non-tender w/o guarding, rebound, hernias, masses or organomegaly.  Lymphatics:  Unremarkable.  Musculoskeletal: Full ROM all peripheral extremities, joint stability, 5/5 strength and normal gait.  Skin: Warm, dry without exposed rashes, lesions or ecchymosis apparent.  Neuro: Cranial nerves intact, reflexes equal bilaterally. Sensory-motor testing grossly intact. Tendon reflexes grossly intact.  Pysch: Alert & oriented x 3.  Insight and judgement nl & appropriate. No ideations.  Assessment and Plan:  1. Essential hypertension  - Continue medication, monitor blood pressure at home.  - Continue DASH diet.  Reminder to go to the ER if any CP,  SOB, nausea, dizziness, severe HA, changes vision/speech.  - CBC with Differential/Platelet - COMPLETE METABOLIC PANEL WITH GFR - Magnesium - TSH  2. Hyperlipidemia, mixed  - Continue diet/meds, exercise,& lifestyle modifications.  - Continue monitor periodic cholesterol/liver & renal functions   - Lipid panel - TSH  3. Type 2 diabetes mellitus with stage 3 chronic kidney disease, without long-term current use of insulin (HCC)  - Continue diet, exercise, lifestyle modifications. Importance of weight loss and better diet was discussed w/patient.  - Monitor appropriate labs.  - Hemoglobin A1c - Insulin, random  4. Vitamin D deficiency disease  - Continue supplementation.  - VITAMIN D 25 Hydroxyl  5. Hypothyroidism  - TSH  6. Medication management  - CBC with Differential/Platelet - COMPLETE METABOLIC PANEL WITH GFR - Magnesium - Lipid panel - TSH - Hemoglobin A1c - Insulin, random - VITAMIN D 25 Hydroxyl           Discussed  regular exercise, BP monitoring, weight control to achieve/maintain BMI less than 25 and discussed med and SE's. Recommended labs to assess and monitor clinical status with further disposition pending results of labs. Over 30 minutes of exam, counseling, chart review was performed.

## 2018-04-01 NOTE — Patient Instructions (Signed)

## 2018-04-02 ENCOUNTER — Ambulatory Visit: Payer: Medicare Other | Admitting: Internal Medicine

## 2018-04-02 ENCOUNTER — Encounter: Payer: Self-pay | Admitting: Internal Medicine

## 2018-04-02 VITALS — BP 122/76 | HR 92 | Temp 97.3°F | Resp 16 | Ht 63.0 in | Wt 219.4 lb

## 2018-04-02 DIAGNOSIS — Z79899 Other long term (current) drug therapy: Secondary | ICD-10-CM

## 2018-04-02 DIAGNOSIS — E1122 Type 2 diabetes mellitus with diabetic chronic kidney disease: Secondary | ICD-10-CM | POA: Diagnosis not present

## 2018-04-02 DIAGNOSIS — N183 Chronic kidney disease, stage 3 (moderate): Secondary | ICD-10-CM | POA: Diagnosis not present

## 2018-04-02 DIAGNOSIS — I10 Essential (primary) hypertension: Secondary | ICD-10-CM | POA: Diagnosis not present

## 2018-04-02 DIAGNOSIS — N182 Chronic kidney disease, stage 2 (mild): Secondary | ICD-10-CM

## 2018-04-02 DIAGNOSIS — E782 Mixed hyperlipidemia: Secondary | ICD-10-CM

## 2018-04-02 DIAGNOSIS — E039 Hypothyroidism, unspecified: Secondary | ICD-10-CM | POA: Diagnosis not present

## 2018-04-02 DIAGNOSIS — E559 Vitamin D deficiency, unspecified: Secondary | ICD-10-CM

## 2018-04-03 LAB — CBC WITH DIFFERENTIAL/PLATELET
BASOS PCT: 0.8 %
Basophils Absolute: 93 cells/uL (ref 0–200)
EOS ABS: 325 {cells}/uL (ref 15–500)
Eosinophils Relative: 2.8 %
HEMATOCRIT: 43.7 % (ref 35.0–45.0)
HEMOGLOBIN: 15.3 g/dL (ref 11.7–15.5)
LYMPHS ABS: 4060 {cells}/uL — AB (ref 850–3900)
MCH: 30.5 pg (ref 27.0–33.0)
MCHC: 35 g/dL (ref 32.0–36.0)
MCV: 87.2 fL (ref 80.0–100.0)
MPV: 10.9 fL (ref 7.5–12.5)
Monocytes Relative: 6.6 %
NEUTROS ABS: 6357 {cells}/uL (ref 1500–7800)
Neutrophils Relative %: 54.8 %
PLATELETS: 274 10*3/uL (ref 140–400)
RBC: 5.01 10*6/uL (ref 3.80–5.10)
RDW: 12.7 % (ref 11.0–15.0)
TOTAL LYMPHOCYTE: 35 %
WBC: 11.6 10*3/uL — AB (ref 3.8–10.8)
WBCMIX: 766 {cells}/uL (ref 200–950)

## 2018-04-03 LAB — MAGNESIUM: Magnesium: 1.8 mg/dL (ref 1.5–2.5)

## 2018-04-03 LAB — LIPID PANEL
CHOL/HDL RATIO: 3.1 (calc) (ref <5.0)
CHOLESTEROL: 122 mg/dL (ref <200)
HDL: 39 mg/dL — AB (ref >50)
LDL Cholesterol (Calc): 52 mg/dL (calc)
NON-HDL CHOLESTEROL (CALC): 83 mg/dL (ref <130)
TRIGLYCERIDES: 267 mg/dL — AB (ref <150)

## 2018-04-03 LAB — COMPLETE METABOLIC PANEL WITH GFR
AG Ratio: 2 (calc) (ref 1.0–2.5)
ALT: 22 U/L (ref 6–29)
AST: 18 U/L (ref 10–35)
Albumin: 4.7 g/dL (ref 3.6–5.1)
Alkaline phosphatase (APISO): 93 U/L (ref 33–130)
BUN: 13 mg/dL (ref 7–25)
CHLORIDE: 100 mmol/L (ref 98–110)
CO2: 29 mmol/L (ref 20–32)
Calcium: 9.7 mg/dL (ref 8.6–10.4)
Creat: 0.93 mg/dL (ref 0.50–0.99)
GFR, EST AFRICAN AMERICAN: 73 mL/min/{1.73_m2} (ref >=60)
GFR, EST NON AFRICAN AMERICAN: 63 mL/min/{1.73_m2} (ref >=60)
GLUCOSE: 102 mg/dL — AB (ref 65–99)
Globulin: 2.3 g/dL (calc) (ref 1.9–3.7)
Potassium: 3.9 mmol/L (ref 3.5–5.3)
Sodium: 138 mmol/L (ref 135–146)
TOTAL PROTEIN: 7 g/dL (ref 6.1–8.1)
Total Bilirubin: 0.6 mg/dL (ref 0.2–1.2)

## 2018-04-03 LAB — VITAMIN D 25 HYDROXY (VIT D DEFICIENCY, FRACTURES): VIT D 25 HYDROXY: 63 ng/mL (ref 30–100)

## 2018-04-03 LAB — INSULIN, RANDOM: Insulin: 79.5 u[IU]/mL — ABNORMAL HIGH (ref 2.0–19.6)

## 2018-04-03 LAB — HEMOGLOBIN A1C
EAG (MMOL/L): 7.6 (calc)
Hgb A1c MFr Bld: 6.4 % of total Hgb — ABNORMAL HIGH (ref <5.7)
Mean Plasma Glucose: 137 (calc)

## 2018-04-03 LAB — TSH: TSH: 2.03 mIU/L (ref 0.40–4.50)

## 2018-04-10 ENCOUNTER — Ambulatory Visit (INDEPENDENT_AMBULATORY_CARE_PROVIDER_SITE_OTHER): Payer: Medicare Other

## 2018-04-10 DIAGNOSIS — J309 Allergic rhinitis, unspecified: Secondary | ICD-10-CM

## 2018-04-16 ENCOUNTER — Ambulatory Visit (INDEPENDENT_AMBULATORY_CARE_PROVIDER_SITE_OTHER): Payer: Medicare Other | Admitting: *Deleted

## 2018-04-16 DIAGNOSIS — J309 Allergic rhinitis, unspecified: Secondary | ICD-10-CM | POA: Diagnosis not present

## 2018-05-10 ENCOUNTER — Other Ambulatory Visit: Payer: Self-pay | Admitting: Physician Assistant

## 2018-05-21 ENCOUNTER — Other Ambulatory Visit: Payer: Self-pay | Admitting: Internal Medicine

## 2018-05-21 ENCOUNTER — Other Ambulatory Visit: Payer: Self-pay | Admitting: Physician Assistant

## 2018-05-21 ENCOUNTER — Encounter: Payer: Self-pay | Admitting: Internal Medicine

## 2018-05-21 DIAGNOSIS — G4700 Insomnia, unspecified: Secondary | ICD-10-CM

## 2018-05-21 DIAGNOSIS — I10 Essential (primary) hypertension: Secondary | ICD-10-CM

## 2018-05-21 MED ORDER — TRAZODONE HCL 150 MG PO TABS: ORAL_TABLET | ORAL | 0 refills | Status: DC

## 2018-05-22 ENCOUNTER — Other Ambulatory Visit: Payer: Self-pay | Admitting: Internal Medicine

## 2018-05-22 DIAGNOSIS — E1122 Type 2 diabetes mellitus with diabetic chronic kidney disease: Secondary | ICD-10-CM

## 2018-05-22 DIAGNOSIS — N183 Chronic kidney disease, stage 3 (moderate): Principal | ICD-10-CM

## 2018-05-22 MED ORDER — GLUCOSE BLOOD VI STRP: ORAL_STRIP | 3 refills | Status: DC

## 2018-05-22 MED ORDER — ONETOUCH ULTRASOFT LANCETS MISC: 3 refills | Status: AC

## 2018-05-27 ENCOUNTER — Ambulatory Visit (INDEPENDENT_AMBULATORY_CARE_PROVIDER_SITE_OTHER): Payer: Medicare Other

## 2018-05-27 DIAGNOSIS — J309 Allergic rhinitis, unspecified: Secondary | ICD-10-CM

## 2018-05-27 NOTE — Progress Notes (Signed)
Patient received her injections today and per Dr. Ernst Bowler patient will be back in two weeks on or around 06/12/2018 to receive injections again due to going out of town and being gone for several weeks. Patient will return to every 4 weeks after next injection.

## 2018-06-09 ENCOUNTER — Other Ambulatory Visit: Payer: Self-pay | Admitting: Internal Medicine

## 2018-06-12 ENCOUNTER — Ambulatory Visit (INDEPENDENT_AMBULATORY_CARE_PROVIDER_SITE_OTHER): Payer: Medicare Other

## 2018-06-12 DIAGNOSIS — J309 Allergic rhinitis, unspecified: Secondary | ICD-10-CM

## 2018-06-13 ENCOUNTER — Other Ambulatory Visit: Payer: Self-pay | Admitting: Internal Medicine

## 2018-06-13 DIAGNOSIS — G4700 Insomnia, unspecified: Secondary | ICD-10-CM

## 2018-07-10 ENCOUNTER — Encounter: Payer: Self-pay | Admitting: *Deleted

## 2018-07-10 NOTE — Progress Notes (Signed)
Vials made. Exp: 07-11-19. hv 

## 2018-07-12 DIAGNOSIS — J3089 Other allergic rhinitis: Secondary | ICD-10-CM | POA: Diagnosis not present

## 2018-07-15 DIAGNOSIS — J301 Allergic rhinitis due to pollen: Secondary | ICD-10-CM

## 2018-07-17 ENCOUNTER — Encounter: Payer: Self-pay | Admitting: Allergy & Immunology

## 2018-07-17 ENCOUNTER — Ambulatory Visit: Payer: Self-pay | Admitting: *Deleted

## 2018-07-17 ENCOUNTER — Ambulatory Visit: Payer: Medicare Other | Admitting: Allergy & Immunology

## 2018-07-17 VITALS — BP 116/74 | HR 73 | Temp 97.7°F | Resp 16 | Ht 62.5 in | Wt 216.2 lb

## 2018-07-17 DIAGNOSIS — J302 Other seasonal allergic rhinitis: Secondary | ICD-10-CM

## 2018-07-17 DIAGNOSIS — J309 Allergic rhinitis, unspecified: Secondary | ICD-10-CM | POA: Diagnosis not present

## 2018-07-17 DIAGNOSIS — J3089 Other allergic rhinitis: Secondary | ICD-10-CM | POA: Diagnosis not present

## 2018-07-17 DIAGNOSIS — J4520 Mild intermittent asthma, uncomplicated: Secondary | ICD-10-CM | POA: Diagnosis not present

## 2018-07-17 MED ORDER — EPINEPHRINE 0.3 MG/0.3ML IJ SOAJ: 0.3000 mg | Freq: Once | INTRAMUSCULAR | 2 refills | Status: AC

## 2018-07-17 NOTE — Progress Notes (Signed)
FOLLOW UP  Date of Service/Encounter:  07/17/18   Assessment:   Mild intermittent asthma without complication   Seasonal and perennial allergic rhinitis - on allergen immunotherapy for 7+ years  Plan/Recommendations:   1. Allergic rhinoconjunctivitis - on allergy shots - Continue with shots at the same schedule.  - Continue with your medications as needed.  2. Mild intermittent asthma, uncomplicated - Continue albuterol 4 puffs every 4-6 hours as needed. - We are going to remove this from your diagnosis list.  3. Return in about 1 year (around 07/18/2019).  Subjective:   Grace Barry is a 67 y.o. female presenting today for follow up of  Chief Complaint  Patient presents with  . Follow-up    Grace Barry has a history of the following: Patient Active Problem List   Diagnosis Date Noted  . Left ear hearing loss 03/28/2016  . Vitamin D deficiency disease 05/19/2015  . Medication management 05/19/2015  . Migraine 02/19/2015  . Vertigo 02/19/2015  . History of colonic polyps 01/15/2015  . Morbid obesity ([place]) 07/01/2014  . Chronic kidney disease (CKD), stage II (mild) 07/01/2014  . Hyperlipidemia   . Liver disease   . Arthritis   . Hypothyroidism 12/15/2010  . T2_NIDDM w/CKD (GFR 56 ml/min) 12/15/2010  . Depression, major, recurrent, in remission ([place]) 12/15/2010  . Obstructive sleep apnea 12/15/2010  . Essential hypertension 12/15/2010  . GERD 12/15/2010    History obtained from: chart review and patient.  [facility] Primary Care Provider is Unk Pinto, MD.     Grace Barry is a 67 y.o. female presenting for a follow up visit.  She was last seen in June 2018.  At that time, her allergies were not under great control.  I recommended on using the nasal steroid twice daily for 1 to 2 weeks.  We continued shots at the same schedule.  We have discussed stopping allergy shots in the past, she prefers to avoid stopping since she is doing so well.  Her asthma was  under good control with albuterol 4 puffs every 4-6 hours as needed.  Since the last visit, she had done very well. She does have the medications but she does not use them at all. She does have problems in [place] occasionally. This year she did very well and did not need antibiotics. They are expecting a 6th grandchild within the next few months from her son in [place].  They are planning to go to [place] for a cruise at the end of the year.  Grace Barry is on allergen immunotherapy. She receives two injections. Immunotherapy script #1 contains molds, cat and dog. She currently receives 0.42mL of the RED vial (1/100). Immunotherapy script #2 contains trees, weeds and grasses. She currently receives 0.38mL of the RED vial (1/100). She started shots 2011 and reached maintenance in 2012.  She does have a complicated past medical history including hypertension and diabetes, among other problems. These are all monitored very closely by her primary care physician. In February 2017, she developed a viral illness that led to left-sided deafness (50% hearing loss). She did see an ENT who actually administered steroid injections into her middle ear. Unfortunately, all of her hearing was not restored; her hearing has gradually improved over the last two years. She does now have a left hearing aid. Otherwise, there've been no changes to her past medical history, surgical history, or family history. She is currently retired. She worked as a [employer].  Review of Systems: a 14-point review of systems is pertinent for what is mentioned in HPI.  Otherwise, all other systems were negative. Constitutional: negative other than that listed in the HPI Eyes: negative other than that listed in the HPI Ears, nose, mouth, throat, and face: negative other than that listed in the HPI Respiratory: negative other than that listed in the HPI Cardiovascular: negative other than that listed in the HPI Gastrointestinal:  negative other than that listed in the HPI Genitourinary: negative other than that listed in the HPI Integument: negative other than that listed in the HPI Hematologic: negative other than that listed in the HPI Musculoskeletal: negative other than that listed in the HPI Neurological: negative other than that listed in the HPI Allergy/Immunologic: negative other than that listed in the HPI    Objective:   Blood pressure 116/74, pulse 73, temperature 97.7 F (36.5 C), temperature source Oral, resp. rate 16, height 5' 2.5" (1.588 m), weight 216 lb 3.2 oz (98.1 kg), SpO2 98 %. Body mass index is 38.91 kg/m.   Physical Exam:  General: Alert, interactive, in no acute distress. Pleasant female.  Eyes: No conjunctival injection bilaterally, no discharge on the right, no discharge on the left and no Horner-Trantas dots present. PERRL bilaterally. EOMI without pain. No photophobia.  Ears: Right TM pearly gray with normal light reflex, Left TM pearly gray with normal light reflex, Right TM intact without perforation and Left TM intact without perforation.  Nose/Throat: External nose within normal limits and septum midline. Turbinates minimally edematous without discharge. Posterior oropharynx mildly erythematous without cobblestoning in the posterior oropharynx. Tonsils 2+ without exudates.  Tongue without thrush. Lungs: Clear to auscultation without wheezing, rhonchi or rales. No increased work of breathing. CV: Normal S1/S2. No murmurs. Capillary refill <2 seconds.  Skin: Warm and dry, without lesions or rashes. Neuro:   Grossly intact. No focal deficits appreciated. Responsive to questions.  Diagnostic studies:   Spirometry: results normal (FEV1: 1.91/86%, FVC: 2.57/87%, FEV1/FVC: 74%).    Spirometry consistent with normal pattern.   Allergy Studies: none     Salvatore Marvel, MD  [facility]

## 2018-07-17 NOTE — Patient Instructions (Addendum)
1. Allergic rhinoconjunctivitis - on allergy shots - Continue with shots at the same schedule.  - Continue with your medications as needed.  2. Mild intermittent asthma, uncomplicated - Continue albuterol 4 puffs every 4-6 hours as needed. - We are going to remove this from your diagnosis list.  3. Return in about 1 year (around 07/18/2019).   Please inform us of any Emergency Department visits, hospitalizations, or changes in symptoms. Call us before going to the ED for breathing or allergy symptoms since we might be able to fit you in for a sick visit. Feel free to contact us anytime with any questions, problems, or concerns.  It was a pleasure to see you again today!  Websites that have reliable patient information: 1. American Academy of Asthma, Allergy, and Immunology: www.aaaai.org 2. Food Allergy Research and Education (FARE): foodallergy.org 3. Mothers of Asthmatics: http://www.asthmacommunitynetwork.org 4. American College of Allergy, Asthma, and Immunology: MonthlyElectricBill.co.uk   Make sure you are registered to vote! If you have moved or changed any of your contact information, you will need to get this updated before voting!

## 2018-08-04 ENCOUNTER — Other Ambulatory Visit: Payer: Self-pay | Admitting: Physician Assistant

## 2018-08-15 ENCOUNTER — Ambulatory Visit (INDEPENDENT_AMBULATORY_CARE_PROVIDER_SITE_OTHER): Payer: Medicare Other | Admitting: *Deleted

## 2018-08-15 DIAGNOSIS — J309 Allergic rhinitis, unspecified: Secondary | ICD-10-CM

## 2018-08-28 ENCOUNTER — Encounter: Payer: Self-pay | Admitting: Physician Assistant

## 2018-09-18 ENCOUNTER — Ambulatory Visit: Payer: Medicare Other | Admitting: Adult Health

## 2018-09-23 ENCOUNTER — Encounter: Payer: Self-pay | Admitting: Physician Assistant

## 2018-10-07 ENCOUNTER — Encounter: Payer: Self-pay | Admitting: Adult Health

## 2018-10-26 ENCOUNTER — Other Ambulatory Visit: Payer: Self-pay | Admitting: Internal Medicine

## 2018-10-28 ENCOUNTER — Other Ambulatory Visit: Payer: Self-pay | Admitting: Physician Assistant

## 2018-11-01 ENCOUNTER — Other Ambulatory Visit: Payer: Self-pay | Admitting: Internal Medicine

## 2018-11-01 DIAGNOSIS — I10 Essential (primary) hypertension: Secondary | ICD-10-CM

## 2018-11-07 ENCOUNTER — Encounter: Payer: Self-pay | Admitting: Adult Health

## 2018-11-07 ENCOUNTER — Ambulatory Visit: Payer: Medicare Other | Admitting: Adult Health

## 2018-11-07 VITALS — BP 116/74 | HR 84 | Temp 96.6°F | Ht 62.5 in | Wt 216.0 lb

## 2018-11-07 DIAGNOSIS — R6889 Other general symptoms and signs: Secondary | ICD-10-CM

## 2018-11-07 DIAGNOSIS — J4520 Mild intermittent asthma, uncomplicated: Secondary | ICD-10-CM | POA: Diagnosis not present

## 2018-11-07 DIAGNOSIS — I10 Essential (primary) hypertension: Secondary | ICD-10-CM

## 2018-11-07 DIAGNOSIS — Z23 Encounter for immunization: Secondary | ICD-10-CM | POA: Diagnosis not present

## 2018-11-07 DIAGNOSIS — E785 Hyperlipidemia, unspecified: Secondary | ICD-10-CM

## 2018-11-07 DIAGNOSIS — K769 Liver disease, unspecified: Secondary | ICD-10-CM

## 2018-11-07 DIAGNOSIS — Z86010 Personal history of colonic polyps: Secondary | ICD-10-CM

## 2018-11-07 DIAGNOSIS — G4733 Obstructive sleep apnea (adult) (pediatric): Secondary | ICD-10-CM | POA: Diagnosis not present

## 2018-11-07 DIAGNOSIS — R42 Dizziness and giddiness: Secondary | ICD-10-CM

## 2018-11-07 DIAGNOSIS — Z0001 Encounter for general adult medical examination with abnormal findings: Secondary | ICD-10-CM | POA: Diagnosis not present

## 2018-11-07 DIAGNOSIS — J3089 Other allergic rhinitis: Secondary | ICD-10-CM | POA: Diagnosis not present

## 2018-11-07 DIAGNOSIS — J302 Other seasonal allergic rhinitis: Secondary | ICD-10-CM

## 2018-11-07 DIAGNOSIS — Z79899 Other long term (current) drug therapy: Secondary | ICD-10-CM

## 2018-11-07 DIAGNOSIS — E1122 Type 2 diabetes mellitus with diabetic chronic kidney disease: Secondary | ICD-10-CM

## 2018-11-07 DIAGNOSIS — Z6838 Body mass index (BMI) 38.0-38.9, adult: Secondary | ICD-10-CM

## 2018-11-07 DIAGNOSIS — F3340 Major depressive disorder, recurrent, in remission, unspecified: Secondary | ICD-10-CM

## 2018-11-07 DIAGNOSIS — M8580 Other specified disorders of bone density and structure, unspecified site: Secondary | ICD-10-CM

## 2018-11-07 DIAGNOSIS — Z0000 Encounter for general adult medical examination without abnormal findings: Secondary | ICD-10-CM

## 2018-11-07 DIAGNOSIS — M1990 Unspecified osteoarthritis, unspecified site: Secondary | ICD-10-CM

## 2018-11-07 DIAGNOSIS — G43009 Migraine without aura, not intractable, without status migrainosus: Secondary | ICD-10-CM

## 2018-11-07 DIAGNOSIS — K760 Fatty (change of) liver, not elsewhere classified: Secondary | ICD-10-CM

## 2018-11-07 DIAGNOSIS — E1169 Type 2 diabetes mellitus with other specified complication: Secondary | ICD-10-CM | POA: Insufficient documentation

## 2018-11-07 DIAGNOSIS — K219 Gastro-esophageal reflux disease without esophagitis: Secondary | ICD-10-CM

## 2018-11-07 DIAGNOSIS — E039 Hypothyroidism, unspecified: Secondary | ICD-10-CM

## 2018-11-07 DIAGNOSIS — E1129 Type 2 diabetes mellitus with other diabetic kidney complication: Secondary | ICD-10-CM

## 2018-11-07 DIAGNOSIS — H9192 Unspecified hearing loss, left ear: Secondary | ICD-10-CM

## 2018-11-07 DIAGNOSIS — N182 Chronic kidney disease, stage 2 (mild): Secondary | ICD-10-CM

## 2018-11-07 DIAGNOSIS — E559 Vitamin D deficiency, unspecified: Secondary | ICD-10-CM

## 2018-11-07 MED ORDER — PAROXETINE HCL 20 MG PO TABS: 20.0000 mg | ORAL_TABLET | Freq: Every day | ORAL | 1 refills | Status: DC

## 2018-11-07 MED ORDER — DULOXETINE HCL 30 MG PO CPEP: 60.0000 mg | ORAL_CAPSULE | Freq: Every day | ORAL | 1 refills | Status: DC

## 2018-11-07 NOTE — Progress Notes (Signed)
MEDICARE ANNUAL WELLNESS VISIT AND FOLLOW UP  Assessment:   Encounter for Medicare annual wellness exam   Essential hypertension - continue medications, DASH diet, exercise and monitor at home. Call if greater than 130/80.  - CBC with Differential/Platelet - CMP/GFR - TSH   Obstructive sleep apnea Sleep apnea- continue mouth piece, weight loss advised.   Asthma, unspecified asthma severity, uncomplicated controlled  Gastroesophageal reflux disease without esophagitis Continue PPI/H2 blocker, diet discussed  Liver disease/ fatty liver disease Check labs, avoid tylenol, alcohol, weight loss advised.   Other specified hypothyroidism Hypothyroidism-check TSH level, continue medications the same, reminded to take on an empty stomach 30-42mins before food.   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 - Hemoglobin A1c  CKD stage 2  Increase fluids, avoid NSAIDS, monitor sugars, will monitor - CMP WITH GFR  Hyperlipidemia -continue medications, check lipids, decrease fatty foods, increase activity.  - Lipid panel  Medication management - Magnesium   Morbid Obesity (BMI 30-39.9) Obesity with co morbidities- long discussion about weight loss, diet, and exercise  Arthritis RICE, NSAIDS, exercises given, if not better get xray and PT referral or ortho referral.   Diarrhea IBS, controlled   History of colonic polyps Due this year  Depression, major, recurrent, in remission ([place]) Grief reaction, had better control previously on paxil, cymbalta really hasn't helped -  Will give her 30 mg of cymbalta to start tapering down after returns from travels, start paxil 20 mg with plan to taper up as needed - she will  call when she starts and report progress, f/o OV in 3 months continue melatonin, trazodone, topamax at night  Morbid obesity ([place]) - follow up 3 months for progress monitoring - increase veggies, decrease carbs - long discussion about weight loss, diet, and exercise  Hypothyroidism continue medications the same pending lab results reminded to take on an empty stomach 30-80mins before food.  check TSH level   Over 30 minutes of exam, counseling, chart review, and critical decision making was performed  Future Appointments  Date Time Provider [place]  12/20/2018  8:00 AM Kathrynn Ducking, MD [facility] None  11/12/2019  2:00 PM Liane Comber, NP [facility] None     Plan:   During the course of the visit the patient was educated and counseled about appropriate screening and preventive services including:    Pneumococcal vaccine   Influenza vaccine  Td vaccine  Screening electrocardiogram  Screening mammography  Bone densitometry screening  Colorectal cancer screening  Diabetes screening  Glaucoma screening  Nutrition counseling   Advanced directives: given info/requested   Subjective:   Grace Barry is a 68 y.o. female who presents for Welcome to medicare visit and 3 month follow up on hypertension, prediabetes, hyperlipidemia, vitamin D def. Patient has hx/o OSA on CPAP from 2006 thru 2016 switching to an Oral Appliance by Dr Augustina Mood. Her GERD is controlled on her meds.  She and her husband travel a lot - she is just back this past week from helping her daughter with new grandson for 8 weeks [place]. Going to [place] on cruise in a few weeks.   She has followed with Dr. Toy Cookey and is on a mouth piece for OSA that is helping her significantly with sleep, had sleep study in march.  She has history  of migraines, she has been following with Dr. Jannifer Franklin for balance and mental fogging, changed some medications which helped.   BMI is Body mass  index is 38.88 kg/m., she has not been working on diet and exercise. She eats 1-2 meals a day. She emotionally and bored eat. She drink 200 oz a day of crystal lite very diluted. She has not been working out.  Wt Readings from Last 3 Encounters:  11/07/18 216 lb (98 kg)  07/17/18 216 lb 3.2 oz (98.1 kg)  04/02/18 219 lb 6.4 oz (99.5 kg)   Her blood pressure has been controlled at home,  Metoprolol 50mg  XL for palpitations, losartan  100 mg,BP: 116/74  She does workout. She denies chest pain, shortness of breath, dizziness.   She is on cholesterol medication, crestor 20mg  daily and denies myalgias. Her cholesterol is at goal. The cholesterol was:   Lab Results  Component Value Date   CHOL 122 04/02/2018   HDL 39 (L) 04/02/2018   LDLCALC 52 04/02/2018   TRIG 267 (H) 04/02/2018   CHOLHDL 3.1 04/02/2018   She has been working on diet and exercise for Diabetes controlled on metformin and invokana with diabetic chronic kidney disease, she is on bASA, she is on ACE/ARB, and denies paresthesia of the feet, polydipsia, polyuria and visual disturbances. Last A1C was:  Lab Results  Component Value Date   HGBA1C 6.4 (H) 04/02/2018   Patient is on Vitamin D supplement. Lab Results  Component Value Date   VD25OH 63 04/02/2018   She is on thyroid medication. Her medication was not changed last visit,.  Lab Results  Component Value Date   TSH 2.03 04/02/2018   Lab Results  Component Value Date   GFRNONAA 63 04/02/2018      Medication Review Current Outpatient Medications on File Prior to Visit  Medication Sig  . albuterol (PROAIR HFA) 108 (90 Base) MCG/ACT inhaler Use 2 puffs every four hours as needed for cough or wheeze.  May use 2 puffs 10-20 minutes prior to exercise.  Marland Kitchen albuterol (PROVENTIL) (2.5 MG/3ML) 0.083% nebulizer solution Take 3 mLs (2.5 mg total) by nebulization every 4 (four) hours as needed for wheezing or shortness of breath.  Marland Kitchen aspirin 81 MG tablet Take 81 mg by mouth  daily.  Marland Kitchen azelastine (ASTELIN) 0.1 % nasal spray Use 1-2 sprays in each nostril 1-2 times daily as needed for stuffy nose or drainage.  . baclofen (LIORESAL) 10 MG tablet Take 10 mg by mouth 2 (two) times daily as needed for muscle spasms.  Marland Kitchen CALCIUM PO Take 1,200 mg by mouth daily.  . cetirizine (ZYRTEC) 10 MG tablet Take 10 mg by mouth as needed. Reported on 10/20/2015  . DULoxetine (CYMBALTA) 60 MG capsule TAKE 1 CAPSULE BY MOUTH EVERY DAY  . EPINEPHrine (EPIPEN 2-PAK) 0.3 mg/0.3 mL IJ SOAJ injection Inject 0.3 mg into the muscle once.  . fluconazole (DIFLUCAN) 150 MG tablet MAY TAKE 1 TABLET WEEKLY FOR YEAST INFECTION IF NEEDED  . fluticasone (FLONASE) 50 MCG/ACT nasal spray Use 1-2 sprays in each nostril 1-2 times daily as needed for stuffy nose or drainage.  Marland Kitchen glucose blood (ONE TOUCH ULTRA TEST) test strip Check Blood sugar  daily  . INVOKANA 300 MG TABS tablet TAKE 1 TABLET (300 MG TOTAL) BY MOUTH DAILY BEFORE BREAKFAST.  Marland Kitchen Lancets (ONETOUCH ULTRASOFT) lancets Check Blood sugar  daily  . losartan (COZAAR) 100 MG tablet TAKE 1 TABLET DAILY FOR BP AND KIDNEY PROTECTION  . Magnesium  250 MG TABS Take 250 mg by mouth 2 (two) times daily.  . metFORMIN (GLUCOPHAGE-XR) 500 MG 24 hr tablet TAKE 2 TABLETS BY MOUTH TWICE DAILY WITH FOOD FOR DIABETES  . metoprolol succinate (TOPROL-XL) 50 MG 24 hr tablet Take 1 tablet (50 mg total) by mouth daily. Take with or immediately following a meal.  . Multiple Vitamins-Minerals (MULTIVITAMIN PO) Take 1 tablet by mouth daily.   . Omega-3 Fatty Acids (FISH OIL PO) Take by mouth daily.  Marland Kitchen PRESCRIPTION MEDICATION Inject as directed every 30 (thirty) days. Allergy Shot  . ranitidine (ZANTAC) 300 MG tablet TAKE 1 TABLET BY MOUTH TWICE A DAY  . rosuvastatin (CRESTOR) 20 MG tablet TAKE 1 TABLET BY MOUTH EVERYDAY AT BEDTIME  . SYNTHROID 200 MCG tablet TAKE 1 TABLET DAILY ON AN EMPTY STOMACH FOR 30 MINUTES OR AS DIRECTED.  Marland Kitchen topiramate (TOPAMAX) 50 MG tablet TAKE 1  TABLET BY MOUTH EVERYDAY AT BEDTIME  . traZODone (DESYREL) 150 MG tablet TAKE 1/2 TO 1 TABLET 1 HOUR BEFORE SLEEP  . Vitamin D, Ergocalciferol, (DRISDOL) 1.25 MG (50000 UT) CAPS capsule Take 1 capsule 4 x /week for Vit D Deficiency   No current facility-administered medications on file prior to visit.     Current Problems (verified) Patient Active Problem List   Diagnosis Date Noted  . Hyperlipidemia associated with type 2 diabetes mellitus ([place]) 11/07/2018  . Seasonal and perennial allergic rhinitis 07/17/2018  . Mild intermittent asthma without complication 04/28/1600  . Left ear hearing loss 03/28/2016  . Vitamin D deficiency disease 05/19/2015  . Medication management 05/19/2015  . Migraine 02/19/2015  . Vertigo 02/19/2015  . History of colonic polyps 01/15/2015  . Morbid obesity ([place]) 07/01/2014  . CKD stage 2 due to type 2 diabetes mellitus ([place]) 07/01/2014  . Fatty liver   . Arthritis   . Hypothyroidism 12/15/2010  . T2_NIDDM w/CKD (GFR 56 ml/min) 12/15/2010  . Depression, major, recurrent, in remission ([place]) 12/15/2010  . Obstructive sleep apnea 12/15/2010  . Essential hypertension 12/15/2010  . GERD 12/15/2010    Screening Tests Immunization History  Administered Date(s) Administered  . Influenza, High Dose Seasonal PF 08/10/2015, 08/10/2016, 08/28/2017  . Pneumococcal Conjugate-13 07/01/2014  . Pneumococcal Polysaccharide-23 10/09/2002, 05/26/2008, 12/13/2016  . Td 10/09/2002, 10/30/2010  . Tdap 08/10/2015  . Zoster 06/20/2011    Preventative care: Tetanus: 2016 Pneumovax: 2018 Prevnar 13: 2015 Flu vaccine: 2018 DUE Zostavax: 2012  Pap: 2009 remote declines another MGM: 09/2017 at [facility] - patient will schedule DEXA: 2017 osteopenia Colonoscopy: 10/2014 due 2019 precancerous polyps EGD: 10/2014 + gastritis MRI 11/20/2017  Names of Other Physician/Practitioners you currently use: 1. [facility]- here for primary  care 2. Dr. Wynetta Emery, [place], eye doctor, last visit 2019, just had laser 3. Dr. Betsey Holiday, dentist, last visit q 6 months 4. Dr. Domingo Cocking, neuro 5. Dr. Toy Cookey, sleep apnea 6. Dr. Deatra Ina GI  Patient Care Team: Unk Pinto, MD as PCP - General (Internal Medicine) Jola Baptist, [place] as Anesthesiologist (Chiropractic Medicine)   Allergies Allergies  Allergen Reactions  . Floxin [Ofloxacin] Hives  . Viberzi [Eluxadoline] Hives  . Doxycycline Rash    SURGICAL HISTORY She  has a past surgical history that includes Total knee arthroplasty; Cholecystectomy; Abdominal hysterectomy; Cesarean section; Tonsillectomy; Knee arthroscopy; Carpal tunnel release; Hernia repair (07/24/11); Joint replacement; Adenoidectomy; and Sinoscopy. FAMILY HISTORY Her family history includes Breast cancer in her mother; Colon cancer in her son; Esophageal cancer (age of onset: 93) in her  son; Hyperlipidemia in her father and sister; Hypertension in her father; Prostate cancer in her father; Stomach cancer in her son. SOCIAL HISTORY She  reports that she has never smoked. She has never used smokeless tobacco. She reports current alcohol use. She reports that she does not use drugs.  MEDICARE WELLNESS OBJECTIVES: Physical activity: Current Exercise Habits: Home exercise routine, Type of exercise: Other - see comments(water aerobics ), Time (Minutes): 50, Frequency (Times/Week): 2, Weekly Exercise (Minutes/Week): 100, Intensity: Mild, Exercise limited by: orthopedic condition(s) Cardiac risk factors: Cardiac Risk Factors include: advanced age (>70men, >72 women);hypertension;dyslipidemia;diabetes mellitus;family history of premature cardiovascular disease;obesity (BMI >30kg/m2) Depression/mood screen:   Depression screen [facility] 2/9 11/07/2018  Decreased Interest 0  Down, Depressed, Hopeless 1  PHQ - 2 Score 1  Altered sleeping -  Tired, decreased energy -  Change in appetite -  Feeling bad or failure about yourself  -   Trouble concentrating -  Moving slowly or fidgety/restless -  Suicidal thoughts -  PHQ-9 Score -    ADLs:  In your present state of health, do you have any difficulty performing the following activities: 11/07/2018 04/02/2018  Hearing? N N  Comment has decreased hearing and roaring in L, saw ENT, has hearing aid -  Vision? N N  Difficulty concentrating or making decisions? N N  Comment - -  Walking or climbing stairs? N N  Comment avoids stairs, s/p bil TKA -  -  Dressing or bathing? N N  Doing errands, shopping? N N  Some recent data might be hidden     Cognitive Testing  Alert? Yes  Normal Appearance?Yes  Oriented to person? Yes  Place? Yes   Time? Yes  Recall of three objects?  Yes  Can perform simple calculations? Yes  Displays appropriate judgment?Yes  Can read the correct time from a watch face?Yes  EOL planning: Does Patient Have a Medical Advance Directive?: Yes Type of Advance Directive: Healthcare Power of Attorney, Living will Does patient want to make changes to medical advance directive?: No - Patient declined Copy of [place] in Chart?: No - copy requested  Review of Systems:  Review of Systems  Constitutional: Negative.   HENT: Negative for congestion, ear discharge, ear pain, hearing loss, nosebleeds, sore throat and tinnitus.   Eyes: Negative.   Respiratory: Negative.  Negative for stridor.   Cardiovascular: Negative.   Gastrointestinal: Negative for abdominal pain, blood in stool, constipation, diarrhea, heartburn, melena, nausea and vomiting.  Genitourinary: Negative.   Musculoskeletal: Negative.  Negative for falls.  Skin: Negative.   Neurological: Positive for headaches. Negative for dizziness, tingling, tremors, sensory change, speech change, focal weakness, seizures and loss of consciousness.  Endo/Heme/Allergies: Negative.   Psychiatric/Behavioral: Positive for depression. Negative for substance abuse and suicidal ideas. The  patient is not nervous/anxious and does not have insomnia.      Objective:   Today's Vitals   11/07/18 1346  BP: 116/74  Pulse: 84  Temp: (!) 96.6 F (35.9 C)  SpO2: 95%  Weight: 216 lb (98 kg)  Height: 5' 2.5" (1.588 m)  PainSc: 5   PainLoc: Back   General appearance: alert, no distress, WD/WN,  female HEENT: normocephalic, sclerae anicteric, TMs pearly, nares patent, no discharge or erythema, pharynx normal Oral cavity: MMM, no lesions Neck: supple, no lymphadenopathy, no thyromegaly, no masses Heart: RRR, normal S1, S2, no murmurs Lungs: CTA bilaterally, no wheezes, rhonchi, or rales Abdomen: +bs, soft, non tender, non distended, no masses, no  hepatomegaly, no splenomegaly Musculoskeletal: nontender, no swelling, no obvious deformity Extremities: no edema, no cyanosis, no clubbing Pulses: 2+ symmetric, upper and lower extremities, normal cap refill Neurological: alert, oriented x 3, CN2-12 intact, strength normal upper extremities and lower extremities, sensation normal throughout, DTRs 2+ throughout, no cerebellar signs, gait normal Psychiatric: normal affect, behavior normal, pleasant  Breast: defer Gyn: defer Rectal: defer  Medicare Attestation I have personally reviewed: The patient's medical and social history Their use of alcohol, tobacco or illicit drugs Their current medications and supplements The patient's functional ability including ADLs,fall risks, home safety risks, cognitive, and hearing and visual impairment Diet and physical activities Evidence for depression or mood disorders  The patient's weight, height, BMI, and visual acuity have been recorded in the chart.  I have made referrals, counseling, and provided education to the patient based on review of the above and I have provided the patient with a written personalized care plan for preventive services.     Izora Ribas, NP   11/07/2018

## 2018-11-07 NOTE — Patient Instructions (Addendum)
Schedule colonoscopy, mammogram and bone denstiy  Ms. Luft , Thank you for taking time to come for your Medicare Wellness Visit. I appreciate your ongoing commitment to your health goals. Please review the following plan we discussed and let me know if I can assist you in the future.   These are the goals we discussed: Goals   None     This is a list of the screening recommended for you and due dates:  Health Maintenance  Topic Date Due  . Colon Cancer Screening  11/17/2017  . Flu Shot  05/30/2018  . Eye exam for diabetics  09/25/2018  . Hemoglobin A1C  10/02/2018  .  Hepatitis C: One time screening is recommended by Center for Disease Control  (CDC) for  adults born from [birth year] through 1967.   12/17/2018*  . Complete foot exam   12/17/2018  . Mammogram  10/03/2019  . Tetanus Vaccine  08/09/2025  . DEXA scan (bone density measurement)  Completed  . Pneumonia vaccines  Completed  *Topic was postponed. The date shown is not the original due date.    HOW TO SCHEDULE A MAMMOGRAM  [facility]  7 a.m.-6:30 p.m., Monday 7 a.m.-5 p.m., Tuesday-Friday Schedule an appointment by calling [phone].  [facility] Schedule an appointment by calling [phone].    When ready, try cutting back to cymbalta 30 mg and start paxil 20 mg, stop cymbalta after 2 weeks at reduced dose. Paxil will take 8-12 weeks to reach full benefit, can slowly increase if needed to 30, 40, 50 mg.   Paroxetine tablets What is this medicine? PAROXETINE (pa ROX e teen) is used to treat depression. It may also be used to treat anxiety disorders, obsessive compulsive disorder, panic attacks, post traumatic stress, and premenstrual dysphoric disorder (PMDD). This medicine may be used for other purposes; ask your health care provider or pharmacist if you have questions. COMMON BRAND NAME(S): Paxil, Pexeva What should I tell my health care provider before I take this  medicine? They need to know if you have any of these conditions: -bipolar disorder or a family history of bipolar disorder -bleeding disorders -glaucoma -heart disease -kidney disease -liver disease -low levels of sodium in the blood -seizures -suicidal thoughts, plans, or attempt; a previous suicide attempt by you or a family member -take MAOIs like Carbex, Eldepryl, Marplan, Nardil, and Parnate -take medicines that treat or prevent blood clots -thyroid disease -an unusual or allergic reaction to paroxetine, other medicines, foods, dyes, or preservatives -pregnant or trying to get pregnant -breast-feeding How should I use this medicine? Take this medicine by mouth with a glass of water. Follow the directions on the prescription label. You can take it with or without food. Take your medicine at regular intervals. Do not take your medicine more often than directed. Do not stop taking this medicine suddenly except upon the advice of your doctor. Stopping this medicine too quickly may cause serious side effects or your condition may worsen. A special MedGuide will be given to you by the pharmacist with each prescription and refill. Be sure to read this information carefully each time. Talk to your pediatrician regarding the use of this medicine in children. Special care may be needed. Overdosage: If you think you have taken too much of this medicine contact a poison control center or emergency room at once. NOTE: This medicine is only for you. Do not share this medicine with others. What if I miss a dose?  If you miss a dose, take it as soon as you can. If it is almost time for your next dose, take only that dose. Do not take double or extra doses. What may interact with this medicine? Do not take this medicine with any of the following medications: -linezolid -MAOIs like Carbex, Eldepryl, Marplan, Nardil, and Parnate -methylene blue (injected into a vein) -pimozide -thioridazine This  medicine may also interact with the following medications: -alcohol -amphetamines -aspirin and aspirin-like medicines -atomoxetine -certain medicines for depression, anxiety, or psychotic disturbances -certain medicines for irregular heart beat like propafenone, flecainide, encainide, and quinidine -certain medicines for migraine headache like almotriptan, eletriptan, frovatriptan, naratriptan, rizatriptan, sumatriptan, zolmitriptan -cimetidine -digoxin -diuretics -fentanyl -fosamprenavir -furazolidone -isoniazid -lithium -medicines that treat or prevent blood clots like warfarin, enoxaparin, and dalteparin -medicines for sleep -NSAIDs, medicines for pain and inflammation, like ibuprofen or naproxen -phenobarbital -phenytoin -procarbazine -rasagiline -ritonavir -supplements like St. John's wort, kava kava, valerian -tamoxifen -tramadol -tryptophan This list may not describe all possible interactions. Give your health care provider a list of all the medicines, herbs, non-prescription drugs, or dietary supplements you use. Also tell them if you smoke, drink alcohol, or use illegal drugs. Some items may interact with your medicine. What should I watch for while using this medicine? Tell your doctor if your symptoms do not get better or if they get worse. Visit your doctor or health care professional for regular checks on your progress. Because it may take several weeks to see the full effects of this medicine, it is important to continue your treatment as prescribed by your doctor. Patients and their families should watch out for new or worsening thoughts of suicide or depression. Also watch out for sudden changes in feelings such as feeling anxious, agitated, panicky, irritable, hostile, aggressive, impulsive, severely restless, overly excited and hyperactive, or not being able to sleep. If this happens, especially at the beginning of treatment or after a change in dose, call your health  care professional. Dennis Bast may get drowsy or dizzy. Do not drive, use machinery, or do anything that needs mental alertness until you know how this medicine affects you. Do not stand or sit up quickly, especially if you are an older patient. This reduces the risk of dizzy or fainting spells. Alcohol may interfere with the effect of this medicine. Avoid alcoholic drinks. Your mouth may get dry. Chewing sugarless gum or sucking hard candy, and drinking plenty of water will help. Contact your doctor if the problem does not go away or is severe. What side effects may I notice from receiving this medicine? Side effects that you should report to your doctor or health care professional as soon as possible: -allergic reactions like skin rash, itching or hives, swelling of the face, lips, or tongue -anxious -black, tarry stools -changes in vision -confusion -elevated mood, decreased need for sleep, racing thoughts, impulsive behavior -eye pain -fast, irregular heartbeat -feeling faint or lightheaded, falls -feeling agitated, angry, or irritable -hallucination, loss of contact with reality -loss of balance or coordination -loss of memory -painful or prolonged erections -restlessness, pacing, inability to keep still -seizures -stiff muscles -suicidal thoughts or other mood changes -trouble sleeping -unusual bleeding or bruising -unusually weak or tired -vomiting Side effects that usually do not require medical attention (report to your doctor or health care professional if they continue or are bothersome): -change in appetite or weight -change in sex drive or performance -diarrhea -dizziness -dry mouth -increased sweating -indigestion, nausea -tired -tremors This list may  not describe all possible side effects. Call your doctor for medical advice about side effects. You may report side effects to FDA at 1-800-FDA-1088. Where should I keep my medicine? Keep out of the reach of children. Store  at room temperature between 15 and 30 degrees C (59 and 86 degrees F). Keep container tightly closed. Throw away any unused medicine after the expiration date. NOTE: This sheet is a summary. It may not cover all possible information. If you have questions about this medicine, talk to your doctor, pharmacist, or health care provider.  2019 Elsevier/Gold Standard (2016-03-18 15:50:32)

## 2018-11-08 ENCOUNTER — Other Ambulatory Visit: Payer: Self-pay | Admitting: Internal Medicine

## 2018-11-08 ENCOUNTER — Other Ambulatory Visit: Payer: Self-pay | Admitting: Adult Health

## 2018-11-08 DIAGNOSIS — Z79899 Other long term (current) drug therapy: Secondary | ICD-10-CM

## 2018-11-08 DIAGNOSIS — E039 Hypothyroidism, unspecified: Secondary | ICD-10-CM

## 2018-11-08 DIAGNOSIS — N182 Chronic kidney disease, stage 2 (mild): Secondary | ICD-10-CM

## 2018-11-08 DIAGNOSIS — E1122 Type 2 diabetes mellitus with diabetic chronic kidney disease: Secondary | ICD-10-CM

## 2018-11-08 LAB — CBC WITH DIFFERENTIAL/PLATELET
Absolute Monocytes: 796 cells/uL (ref 200–950)
BASOS PCT: 0.5 %
Basophils Absolute: 59 cells/uL (ref 0–200)
EOS ABS: 234 {cells}/uL (ref 15–500)
Eosinophils Relative: 2 %
HCT: 48.4 % — ABNORMAL HIGH (ref 35.0–45.0)
Hemoglobin: 16.1 g/dL — ABNORMAL HIGH (ref 11.7–15.5)
Lymphs Abs: 3498 cells/uL (ref 850–3900)
MCH: 29.8 pg (ref 27.0–33.0)
MCHC: 33.3 g/dL (ref 32.0–36.0)
MCV: 89.6 fL (ref 80.0–100.0)
MONOS PCT: 6.8 %
MPV: 11 fL (ref 7.5–12.5)
NEUTROS PCT: 60.8 %
Neutro Abs: 7114 cells/uL (ref 1500–7800)
PLATELETS: 350 10*3/uL (ref 140–400)
RBC: 5.4 10*6/uL — AB (ref 3.80–5.10)
RDW: 13.3 % (ref 11.0–15.0)
TOTAL LYMPHOCYTE: 29.9 %
WBC: 11.7 10*3/uL — AB (ref 3.8–10.8)

## 2018-11-08 LAB — COMPLETE METABOLIC PANEL WITH GFR
AG Ratio: 1.9 (calc) (ref 1.0–2.5)
ALKALINE PHOSPHATASE (APISO): 90 U/L (ref 33–130)
ALT: 30 U/L — ABNORMAL HIGH (ref 6–29)
AST: 31 U/L (ref 10–35)
Albumin: 4.7 g/dL (ref 3.6–5.1)
BILIRUBIN TOTAL: 0.7 mg/dL (ref 0.2–1.2)
BUN / CREAT RATIO: 16 (calc) (ref 6–22)
BUN: 18 mg/dL (ref 7–25)
CO2: 28 mmol/L (ref 20–32)
CREATININE: 1.16 mg/dL — AB (ref 0.50–0.99)
Calcium: 10.1 mg/dL (ref 8.6–10.4)
Chloride: 98 mmol/L (ref 98–110)
GFR, EST AFRICAN AMERICAN: 56 mL/min/{1.73_m2} — AB (ref >=60)
GFR, EST NON AFRICAN AMERICAN: 48 mL/min/{1.73_m2} — AB (ref >=60)
GLUCOSE: 117 mg/dL — AB (ref 65–99)
Globulin: 2.5 g/dL (calc) (ref 1.9–3.7)
Potassium: 4.3 mmol/L (ref 3.5–5.3)
Sodium: 136 mmol/L (ref 135–146)
TOTAL PROTEIN: 7.2 g/dL (ref 6.1–8.1)

## 2018-11-08 LAB — LIPID PANEL
CHOL/HDL RATIO: 4.4 (calc) (ref <5.0)
Cholesterol: 206 mg/dL — ABNORMAL HIGH (ref <200)
HDL: 47 mg/dL — ABNORMAL LOW (ref >50)
LDL CHOLESTEROL (CALC): 122 mg/dL — AB
NON-HDL CHOLESTEROL (CALC): 159 mg/dL — AB (ref <130)
TRIGLYCERIDES: 245 mg/dL — AB (ref <150)

## 2018-11-08 LAB — HEMOGLOBIN A1C
Hgb A1c MFr Bld: 6.5 % of total Hgb — ABNORMAL HIGH (ref <5.7)
Mean Plasma Glucose: 140 (calc)
eAG (mmol/L): 7.7 (calc)

## 2018-11-08 LAB — MAGNESIUM: Magnesium: 2.1 mg/dL (ref 1.5–2.5)

## 2018-11-08 LAB — TSH: TSH: 14.56 m[IU]/L — AB (ref 0.40–4.50)

## 2018-11-13 ENCOUNTER — Ambulatory Visit (INDEPENDENT_AMBULATORY_CARE_PROVIDER_SITE_OTHER): Payer: Medicare Other | Admitting: *Deleted

## 2018-11-13 DIAGNOSIS — J309 Allergic rhinitis, unspecified: Secondary | ICD-10-CM

## 2018-11-13 MED ORDER — AZITHROMYCIN 250 MG PO TABS: ORAL_TABLET | ORAL | 0 refills | Status: DC

## 2018-11-13 MED ORDER — FLUCONAZOLE 150 MG PO TABS: 150.0000 mg | ORAL_TABLET | Freq: Every day | ORAL | 0 refills | Status: DC

## 2018-11-13 MED ORDER — PROMETHAZINE HCL 25 MG PO TABS: 25.0000 mg | ORAL_TABLET | Freq: Four times a day (QID) | ORAL | 0 refills | Status: DC | PRN

## 2018-11-13 MED ORDER — SCOPOLAMINE 1 MG/3DAYS TD PT72: 1.0000 | MEDICATED_PATCH | TRANSDERMAL | 0 refills | Status: DC

## 2018-11-18 ENCOUNTER — Other Ambulatory Visit: Payer: Self-pay | Admitting: Adult Health

## 2018-11-18 DIAGNOSIS — M8580 Other specified disorders of bone density and structure, unspecified site: Secondary | ICD-10-CM

## 2018-11-19 LAB — HM DEXA SCAN

## 2018-11-19 LAB — HM MAMMOGRAPHY

## 2018-11-20 ENCOUNTER — Ambulatory Visit (INDEPENDENT_AMBULATORY_CARE_PROVIDER_SITE_OTHER): Payer: Medicare Other

## 2018-11-20 DIAGNOSIS — J309 Allergic rhinitis, unspecified: Secondary | ICD-10-CM | POA: Diagnosis not present

## 2018-11-21 ENCOUNTER — Encounter: Payer: Self-pay | Admitting: Internal Medicine

## 2018-11-22 ENCOUNTER — Ambulatory Visit: Payer: Medicare Other | Admitting: Neurology

## 2018-11-25 ENCOUNTER — Encounter: Payer: Self-pay | Admitting: Internal Medicine

## 2018-12-20 ENCOUNTER — Ambulatory Visit: Payer: Medicare Other | Admitting: Neurology

## 2018-12-23 ENCOUNTER — Other Ambulatory Visit: Payer: Medicare Other

## 2018-12-23 DIAGNOSIS — N182 Chronic kidney disease, stage 2 (mild): Secondary | ICD-10-CM

## 2018-12-23 DIAGNOSIS — E1122 Type 2 diabetes mellitus with diabetic chronic kidney disease: Secondary | ICD-10-CM

## 2018-12-23 DIAGNOSIS — E039 Hypothyroidism, unspecified: Secondary | ICD-10-CM

## 2018-12-23 DIAGNOSIS — Z79899 Other long term (current) drug therapy: Secondary | ICD-10-CM

## 2018-12-24 LAB — BASIC METABOLIC PANEL WITH GFR
BUN/Creatinine Ratio: 14 (calc) (ref 6–22)
BUN: 15 mg/dL (ref 7–25)
CO2: 27 mmol/L (ref 20–32)
Calcium: 9.6 mg/dL (ref 8.6–10.4)
Chloride: 102 mmol/L (ref 98–110)
Creat: 1.1 mg/dL — ABNORMAL HIGH (ref 0.50–0.99)
GFR, Est African American: 59 mL/min/{1.73_m2} — ABNORMAL LOW (ref >=60)
GFR, Est Non African American: 51 mL/min/{1.73_m2} — ABNORMAL LOW (ref >=60)
Glucose, Bld: 134 mg/dL — ABNORMAL HIGH (ref 65–99)
Potassium: 4.2 mmol/L (ref 3.5–5.3)
Sodium: 139 mmol/L (ref 135–146)

## 2018-12-24 LAB — CBC WITH DIFFERENTIAL/PLATELET
Absolute Monocytes: 570 cells/uL (ref 200–950)
Basophils Absolute: 53 cells/uL (ref 0–200)
Basophils Relative: 0.6 %
EOS ABS: 223 {cells}/uL (ref 15–500)
Eosinophils Relative: 2.5 %
HCT: 45.4 % — ABNORMAL HIGH (ref 35.0–45.0)
Hemoglobin: 15.5 g/dL (ref 11.7–15.5)
Lymphs Abs: 2519 cells/uL (ref 850–3900)
MCH: 30.5 pg (ref 27.0–33.0)
MCHC: 34.1 g/dL (ref 32.0–36.0)
MCV: 89.4 fL (ref 80.0–100.0)
MONOS PCT: 6.4 %
MPV: 10.7 fL (ref 7.5–12.5)
Neutro Abs: 5536 cells/uL (ref 1500–7800)
Neutrophils Relative %: 62.2 %
Platelets: 289 10*3/uL (ref 140–400)
RBC: 5.08 10*6/uL (ref 3.80–5.10)
RDW: 12.4 % (ref 11.0–15.0)
Total Lymphocyte: 28.3 %
WBC: 8.9 10*3/uL (ref 3.8–10.8)

## 2018-12-24 LAB — TSH: TSH: 0.4 m[IU]/L (ref 0.40–4.50)

## 2018-12-25 ENCOUNTER — Other Ambulatory Visit: Payer: Self-pay | Admitting: Adult Health

## 2018-12-26 NOTE — Progress Notes (Signed)
PATIENT: Grace Barry DOB: 1951-10-08  REASON FOR VISIT: follow up HISTORY FROM: patient  HISTORY OF PRESENT ILLNESS: Today 12/30/18  Grace Barry is a 68 year old female who presents for follow-up for migraine headache.  She is currently taking Topamax 50 mg daily and metoprolol 50 mg XR.  She reports she may have 2 headaches a month.  She reports that her headaches are not as bad as they used to be.  When she gets a headache she will take a baclofen tablet and go to bed.  Today she is requesting to decrease her Topamax dose because she continues to have a foggy memory.  She finds that she will repeat herself, forgets where she puts things, forgets what people tell her.  She is worried about her memory because her mother had dementia.  She denies any new problems or concerns.  She presents today for follow-up unaccompanied.  HISTORY 03/13/2018 Dr. Jannifer Franklin Grace Barry is a 68 year old right-handed white female with a history of obesity and sleep apnea who has a history of migraine headache. The patient has had reports of memory problems on a higher dose of the Topamax, the Topamax was reduced to 50 mg daily and the metoprolol was increased.  The headaches have leveled off to having about 1 month, the headaches are easy to control, the patient will take baclofen if needed.  The patient has chronic issues with feeling tired and drowsy during the day, she uses a sleep appliance at night.  She is followed through a sleep physician.  The patient has had a dramatic improvement in her memory and with her ability to concentrate.  She is quite pleased with her current therapy.  REVIEW OF SYSTEMS: Out of a complete 14 system review of symptoms, the patient complains only of the following symptoms, and all other reviewed systems are negative.  Fatigue, hearing loss, eye itching, heat intolerance, apnea, daytime sleepiness, sleep talking, environmental allergies, aching muscles, memory loss, depression,  nervous/anxious  ALLERGIES: Allergies  Allergen Reactions  . Floxin [Ofloxacin] Hives  . Viberzi [Eluxadoline] Hives  . Doxycycline Rash    HOME MEDICATIONS: Outpatient Medications Prior to Visit  Medication Sig Dispense Refill  . albuterol (PROAIR HFA) 108 (90 Base) MCG/ACT inhaler Use 2 puffs every four hours as needed for cough or wheeze.  May use 2 puffs 10-20 minutes prior to exercise. 1 Inhaler 1  . albuterol (PROVENTIL) (2.5 MG/3ML) 0.083% nebulizer solution Take 3 mLs (2.5 mg total) by nebulization every 4 (four) hours as needed for wheezing or shortness of breath. 30 vial 0  . aspirin 81 MG tablet Take 81 mg by mouth daily.    Marland Kitchen azelastine (ASTELIN) 0.1 % nasal spray Use 1-2 sprays in each nostril 1-2 times daily as needed for stuffy nose or drainage. 30 mL 5  . CALCIUM PO Take 1,200 mg by mouth daily.    . cetirizine (ZYRTEC) 10 MG tablet Take 10 mg by mouth as needed. Reported on 10/20/2015    . DULoxetine (CYMBALTA) 30 MG capsule Take 30 mg by mouth daily.    Marland Kitchen EPINEPHrine (EPIPEN 2-PAK) 0.3 mg/0.3 mL IJ SOAJ injection Inject 0.3 mg into the muscle once.    . fluconazole (DIFLUCAN) 150 MG tablet Take 1 tablet (150 mg total) by mouth daily. (Patient taking differently: Take 150 mg by mouth as needed. ) 1 tablet 0  . fluticasone (FLONASE) 50 MCG/ACT nasal spray Use 1-2 sprays in each nostril 1-2 times daily as needed for  stuffy nose or drainage. 17 g 5  . glucose blood (ONE TOUCH ULTRA TEST) test strip Check Blood sugar  daily 100 each 3  . INVOKANA 300 MG TABS tablet TAKE 1 TABLET (300 MG TOTAL) BY MOUTH DAILY BEFORE BREAKFAST. 90 tablet 1  . Lancets (ONETOUCH ULTRASOFT) lancets Check Blood sugar  daily 100 each 3  . losartan (COZAAR) 100 MG tablet TAKE 1 TABLET DAILY FOR BP AND KIDNEY PROTECTION 90 tablet 1  . Magnesium 250 MG TABS Take 250 mg by mouth 2 (two) times daily.    . metFORMIN (GLUCOPHAGE-XR) 500 MG 24 hr tablet TAKE 2 TABLETS BY MOUTH TWICE DAILY WITH FOOD FOR  DIABETES 360 tablet 2  . metoprolol succinate (TOPROL-XL) 50 MG 24 hr tablet Take 1 tablet (50 mg total) by mouth daily. Take with or immediately following a meal. 90 tablet 3  . Multiple Vitamins-Minerals (MULTIVITAMIN PO) Take 1 tablet by mouth daily.     . Omega-3 Fatty Acids (FISH OIL PO) Take by mouth daily.    Marland Kitchen PARoxetine (PAXIL) 20 MG tablet Take 1 tablet (20 mg total) by mouth daily. 90 tablet 1  . PRESCRIPTION MEDICATION Inject as directed every 30 (thirty) days. Allergy Shot    . ranitidine (ZANTAC) 300 MG tablet TAKE 1 TABLET BY MOUTH TWICE A DAY 180 tablet 1  . rosuvastatin (CRESTOR) 20 MG tablet TAKE 1 TABLET BY MOUTH EVERYDAY AT BEDTIME 90 tablet 0  . SYNTHROID 200 MCG tablet TAKE 1 TABLET DAILY ON AN EMPTY STOMACH FOR 30 MINUTES OR AS DIRECTED. 90 tablet 1  . topiramate (TOPAMAX) 50 MG tablet TAKE 1 TABLET BY MOUTH EVERYDAY AT BEDTIME 90 tablet 3  . Vitamin D, Ergocalciferol, (DRISDOL) 1.25 MG (50000 UT) CAPS capsule Take 1 capsule 4 x /week for Vit D Deficiency 48 capsule 1  . baclofen (LIORESAL) 10 MG tablet Take 10 mg by mouth 2 (two) times daily as needed for muscle spasms.    . promethazine (PHENERGAN) 25 MG tablet Take 1 tablet (25 mg total) by mouth every 6 (six) hours as needed for nausea or vomiting (can cause fatigue). Max: 4 tablets per day (Patient not taking: Reported on 12/30/2018) 30 tablet 0  . scopolamine (TRANSDERM-SCOP, 1.5 MG,) 1 MG/3DAYS Place 1 patch (1.5 mg total) onto the skin every 3 (three) days. (Patient not taking: Reported on 12/30/2018) 4 patch 0  . traZODone (DESYREL) 150 MG tablet TAKE 1/2 TO 1 TABLET 1 HOUR BEFORE SLEEP (Patient not taking: Reported on 12/30/2018) 90 tablet 1  . azithromycin (ZITHROMAX) 250 MG tablet Take 2 tablets PO on Day 1, then 1 tablet PO QDaily for 4 days. 6 tablet 0   No facility-administered medications prior to visit.     PAST MEDICAL HISTORY: Past Medical History:  Diagnosis Date  . Allergy   . Anxiety   . Arthritis   .  Asthma   . Chronic kidney disease (CKD), stage II (mild) 07/01/2014   Overview:  Overview:  Due to DM, last GFR 64   . Depression   . Fibromyalgia   . GERD (gastroesophageal reflux disease)   . Hyperlipidemia   . Hypertension   . Liver disease   . Migraine 02/19/2015  . Migraines   . Obesity   . Sleep apnea   . Sleep difficulties   . Thyroid disease    hypothyroidism  . Type II or unspecified type diabetes mellitus without mention of complication, not stated as uncontrolled   . Urticaria   .  Ventral hernia   . Vertigo 02/19/2015    PAST SURGICAL HISTORY: Past Surgical History:  Procedure Laterality Date  . ABDOMINAL HYSTERECTOMY    . ADENOIDECTOMY    . CARPAL TUNNEL RELEASE     bilateral  . CESAREAN SECTION     x2  . CHOLECYSTECTOMY    . HERNIA REPAIR  07/24/11   ventral hernia  . JOINT REPLACEMENT    . KNEE ARTHROSCOPY     bilateral  . SINOSCOPY    . TONSILLECTOMY    . TOTAL KNEE ARTHROPLASTY     bilateral    FAMILY HISTORY: Family History  Problem Relation Age of Onset  . Breast cancer Mother   . Hypertension Father   . Hyperlipidemia Father   . Prostate cancer Father   . Hyperlipidemia Sister   . Esophageal cancer Son 23       Died 12/01/2013  . Colon cancer Son   . Stomach cancer Son   . Colon polyps Neg Hx   . Diabetes Neg Hx   . Kidney disease Neg Hx   . Migraines Neg Hx     SOCIAL HISTORY: Social History   Socioeconomic History  . Marital status: Married    Spouse name: Francee Piccolo  . Number of children: 2  . Years of education: masters  . Highest education level: Not on file  Occupational History  . Occupation: Retired Tour manager  . Financial resource strain: Not on file  . Food insecurity:    Worry: Not on file    Inability: Not on file  . Transportation needs:    Medical: Not on file    Non-medical: Not on file  Tobacco Use  . Smoking status: Never Smoker  . Smokeless tobacco: Never Used  Substance and Sexual Activity  .  Alcohol use: Yes    Alcohol/week: 0.0 standard drinks    Comment: occasional  . Drug use: No  . Sexual activity: Not on file  Lifestyle  . Physical activity:    Days per week: Not on file    Minutes per session: Not on file  . Stress: Not on file  Relationships  . Social connections:    Talks on phone: Not on file    Gets together: Not on file    Attends religious service: Not on file    Active member of club or organization: Not on file    Attends meetings of clubs or organizations: Not on file    Relationship status: Not on file  . Intimate partner violence:    Fear of current or ex partner: Not on file    Emotionally abused: Not on file    Physically abused: Not on file    Forced sexual activity: Not on file  Other Topics Concern  . Not on file  Social History Narrative   Lives with husband, Francee Piccolo   Patient is right handed.   Patient drinks 64 oz of diet soda daily.      PHYSICAL EXAM  Vitals:   12/30/18 1035  BP: (!) 144/85  Pulse: 78  Weight: 210 lb 12.8 oz (95.6 kg)  Height: 5' 2.5" (1.588 m)   Body mass index is 37.94 kg/m.  Generalized: Well developed, in no acute distress   Neurological examination  Mentation: Alert oriented to time, place, history taking. Follows all commands speech and language fluent Cranial nerve II-XII: Pupils were equal round reactive to light. Extraocular movements were full, visual field were full on  confrontational test. Facial sensation and strength were normal. Uvula tongue midline. Head turning and shoulder shrug  were normal and symmetric. Motor: The motor testing reveals 5 over 5 strength of all 4 extremities. Good symmetric motor tone is noted throughout.  Sensory: Sensory testing is intact to soft touch on all 4 extremities. No evidence of extinction is noted.  Coordination: Cerebellar testing reveals good finger-nose-finger and heel-to-shin bilaterally.  Gait and station: Gait is normal. Tandem gait is normal. Romberg is  negative. No drift is seen.  Reflexes: Deep tendon reflexes are symmetric and normal bilaterally.   DIAGNOSTIC DATA (LABS, IMAGING, TESTING) - I reviewed patient records, labs, notes, testing and imaging myself where available.  Lab Results  Component Value Date   WBC 8.9 12/23/2018   HGB 15.5 12/23/2018   HCT 45.4 (H) 12/23/2018   MCV 89.4 12/23/2018   PLT 289 12/23/2018      Component Value Date/Time   NA 139 12/23/2018 1407   K 4.2 12/23/2018 1407   CL 102 12/23/2018 1407   CO2 27 12/23/2018 1407   GLUCOSE 134 (H) 12/23/2018 1407   BUN 15 12/23/2018 1407   CREATININE 1.10 (H) 12/23/2018 1407   CALCIUM 9.6 12/23/2018 1407   PROT 7.2 11/07/2018 1449   ALBUMIN 4.0 03/28/2017 1236   AST 31 11/07/2018 1449   ALT 30 (H) 11/07/2018 1449   ALKPHOS 70 03/28/2017 1236   BILITOT 0.7 11/07/2018 1449   GFRNONAA 51 (L) 12/23/2018 1407   GFRAA 59 (L) 12/23/2018 1407   Lab Results  Component Value Date   CHOL 206 (H) 11/07/2018   HDL 47 (L) 11/07/2018   LDLCALC 122 (H) 11/07/2018   TRIG 245 (H) 11/07/2018   CHOLHDL 4.4 11/07/2018   Lab Results  Component Value Date   HGBA1C 6.5 (H) 11/07/2018   Lab Results  Component Value Date   VITAMINB12 330 08/28/2017   Lab Results  Component Value Date   TSH 0.40 12/23/2018      ASSESSMENT AND PLAN 68 y.o. year old female  has a past medical history of Allergy, Anxiety, Arthritis, Asthma, Chronic kidney disease (CKD), stage II (mild) (07/01/2014), Depression, Fibromyalgia, GERD (gastroesophageal reflux disease), Hyperlipidemia, Hypertension, Liver disease, Migraine (02/19/2015), Migraines, Obesity, Sleep apnea, Sleep difficulties, Thyroid disease, Type II or unspecified type diabetes mellitus without mention of complication, not stated as uncontrolled, Urticaria, Ventral hernia, and Vertigo (02/19/2015). here with:  1.  Migraine headache  She reports 1-2 headaches per month.  She is requesting to decrease her Topamax dose due to  memory concerns.  We will decrease the Topamax to 25 mg daily.  We will continue the Toprol-XL at 50 mg daily.Marland Kitchen  Her blood pressure is mildly elevated today at 144/75, HR 78.  She reports she checks her blood pressure at home and it is good in the 120s.  We could potentially increase the Toprol-XL to 75 mg daily. she is concerned about her memory and does mention that her mother had a history of dementia.  I did check an MMSE today and it was 30/30.  She will follow-up in 6 months for a recheck on her headaches.  She will call me if her headaches worsen and we need to adjust the medication.  She may need evaluation for memory in the future however we will continue to decrease the Topamax to see if that provides any improvement.   I spent 15 minutes with the patient. 50% of this time was spent discussing her plan of  care.    Butler Denmark, AGNP-C, DNP 12/30/2018, 11:09 AM [facility] [address], [place], [place] [address] [phone]

## 2018-12-30 ENCOUNTER — Encounter: Payer: Self-pay | Admitting: Neurology

## 2018-12-30 ENCOUNTER — Ambulatory Visit: Payer: Medicare Other | Admitting: Neurology

## 2018-12-30 VITALS — BP 144/85 | HR 78 | Ht 62.5 in | Wt 210.8 lb

## 2018-12-30 DIAGNOSIS — G43009 Migraine without aura, not intractable, without status migrainosus: Secondary | ICD-10-CM | POA: Diagnosis not present

## 2018-12-30 MED ORDER — BACLOFEN 10 MG PO TABS: 10.0000 mg | ORAL_TABLET | Freq: Two times a day (BID) | ORAL | 0 refills | Status: DC | PRN

## 2018-12-30 NOTE — Progress Notes (Signed)
I have read the note, and I agree with the clinical assessment and plan.  Eastyn Skalla K Taronda Comacho   

## 2018-12-31 ENCOUNTER — Other Ambulatory Visit: Payer: Self-pay | Admitting: Adult Health

## 2018-12-31 MED ORDER — VITAMIN D (ERGOCALCIFEROL) 1.25 MG (50000 UNIT) PO CAPS: ORAL_CAPSULE | ORAL | 1 refills | Status: DC

## 2018-12-31 MED ORDER — METFORMIN HCL ER 500 MG PO TB24: ORAL_TABLET | ORAL | 2 refills | Status: DC

## 2019-01-08 ENCOUNTER — Ambulatory Visit (INDEPENDENT_AMBULATORY_CARE_PROVIDER_SITE_OTHER): Payer: Medicare Other | Admitting: *Deleted

## 2019-01-08 DIAGNOSIS — J309 Allergic rhinitis, unspecified: Secondary | ICD-10-CM

## 2019-01-09 ENCOUNTER — Other Ambulatory Visit: Payer: Self-pay | Admitting: Neurology

## 2019-01-17 ENCOUNTER — Ambulatory Visit (INDEPENDENT_AMBULATORY_CARE_PROVIDER_SITE_OTHER): Payer: Medicare Other | Admitting: *Deleted

## 2019-01-17 DIAGNOSIS — M24132 Other articular cartilage disorders, left wrist: Secondary | ICD-10-CM | POA: Insufficient documentation

## 2019-01-17 DIAGNOSIS — R52 Pain, unspecified: Secondary | ICD-10-CM | POA: Insufficient documentation

## 2019-01-17 DIAGNOSIS — J309 Allergic rhinitis, unspecified: Secondary | ICD-10-CM

## 2019-01-17 HISTORY — DX: Other articular cartilage disorders, left wrist: M24.132

## 2019-01-30 ENCOUNTER — Encounter: Payer: Self-pay | Admitting: Allergy & Immunology

## 2019-01-31 ENCOUNTER — Other Ambulatory Visit: Payer: Self-pay | Admitting: Adult Health

## 2019-02-04 ENCOUNTER — Other Ambulatory Visit: Payer: Self-pay | Admitting: Internal Medicine

## 2019-02-11 MED ORDER — FAMOTIDINE 40 MG PO TABS: 40.0000 mg | ORAL_TABLET | Freq: Two times a day (BID) | ORAL | 1 refills | Status: DC

## 2019-02-11 MED ORDER — PAROXETINE HCL 40 MG PO TABS: 40.0000 mg | ORAL_TABLET | Freq: Every day | ORAL | 2 refills | Status: DC

## 2019-02-13 MED ORDER — PAROXETINE HCL 40 MG PO TABS: 40.0000 mg | ORAL_TABLET | Freq: Every day | ORAL | 2 refills | Status: DC

## 2019-02-13 NOTE — Addendum Note (Signed)
Addended by: Vicie Mutters R on: 02/13/2019 12:53 PM   Modules accepted: Orders

## 2019-02-18 ENCOUNTER — Ambulatory Visit: Payer: Self-pay | Admitting: Internal Medicine

## 2019-02-18 ENCOUNTER — Ambulatory Visit: Payer: Medicare Other | Admitting: Internal Medicine

## 2019-02-18 ENCOUNTER — Encounter: Payer: Self-pay | Admitting: Internal Medicine

## 2019-02-18 ENCOUNTER — Other Ambulatory Visit: Payer: Self-pay

## 2019-02-18 VITALS — BP 124/80 | HR 76 | Temp 97.2°F | Resp 16 | Ht 62.5 in | Wt 212.6 lb

## 2019-02-18 DIAGNOSIS — Z79899 Other long term (current) drug therapy: Secondary | ICD-10-CM

## 2019-02-18 DIAGNOSIS — I10 Essential (primary) hypertension: Secondary | ICD-10-CM | POA: Diagnosis not present

## 2019-02-18 DIAGNOSIS — E559 Vitamin D deficiency, unspecified: Secondary | ICD-10-CM | POA: Diagnosis not present

## 2019-02-18 DIAGNOSIS — N183 Chronic kidney disease, stage 3 unspecified: Secondary | ICD-10-CM

## 2019-02-18 DIAGNOSIS — E039 Hypothyroidism, unspecified: Secondary | ICD-10-CM

## 2019-02-18 DIAGNOSIS — E782 Mixed hyperlipidemia: Secondary | ICD-10-CM

## 2019-02-18 DIAGNOSIS — E1122 Type 2 diabetes mellitus with diabetic chronic kidney disease: Secondary | ICD-10-CM

## 2019-02-18 NOTE — Patient Instructions (Signed)

## 2019-02-18 NOTE — Progress Notes (Signed)
This very nice 68 y.o.female presents for 3 month follow up with HTN, HLD, Pre-Diabetes and Vitamin D Deficiency. Patient has GERD controlled on her meds. Patient  on CPAP from 2006 - 2016 for OSA when transitioned to an Oral Appliance by Dr Augustina Mood, DDS.      Patient is treated for HTN since 2009 & BP has been controlled at home. Today's BP: 124/80. Patient has had no complaints of any cardiac type chest pain, palpitations, dyspnea / orthopnea / PND, dizziness, claudication, or dependent edema.      Hyperlipidemia is not controlled with diet & meds. Patient denies myalgias or other med SE's. Last Lipids were not at goal: Lab Results  Component Value Date   CHOL 206 (H) 11/07/2018   HDL 47 (L) 11/07/2018   LDLCALC 122 (H) 11/07/2018   TRIG 245 (H) 11/07/2018   CHOLHDL 4.4 11/07/2018        Also, the patient has Morbid Obesity (BMI 36+) and  history of T2_DM since 2006. And she  has had no symptoms of reactive hypoglycemia, diabetic polys, paresthesias or visual blurring.  Last A1c was not at goal: Lab Results  Component Value Date   HGBA1C 6.5 (H) 11/07/2018        Patient was started on Thyroid  Replacement in 2008.      Further, the patient also has history of Vitamin D Deficiency and supplements vitamin D without any suspected side-effects. Last vitamin D was at goal:  Lab Results  Component Value Date   VD25OH 63 04/02/2018    Current Outpatient Medications on File Prior to Visit  Medication Sig  . albuterol (PROAIR HFA) 108 (90 Base) MCG/ACT inhaler Use 2 puffs every four hours as needed for cough or wheeze.  May use 2 puffs 10-20 minutes prior to exercise.  Marland Kitchen albuterol (PROVENTIL) (2.5 MG/3ML) 0.083% nebulizer solution Take 3 mLs (2.5 mg total) by nebulization every 4 (four) hours as needed for wheezing or shortness of breath.  Marland Kitchen azelastine (ASTELIN) 0.1 % nasal spray Use 1-2 sprays in each nostril 1-2 times daily as needed for stuffy nose or drainage.  .  baclofen (LIORESAL) 10 MG tablet Take 1 tablet (10 mg total) by mouth 2 (two) times daily as needed for muscle spasms.  Marland Kitchen CALCIUM PO Take 1,200 mg by mouth daily.  . cetirizine (ZYRTEC) 10 MG tablet Take 10 mg by mouth as needed. Reported on 10/20/2015  . EPINEPHrine (EPIPEN 2-PAK) 0.3 mg/0.3 mL IJ SOAJ injection Inject 0.3 mg into the muscle once.  . famotidine (PEPCID) 40 MG tablet Take 1 tablet (40 mg total) by mouth 2 (two) times daily.  . fluconazole (DIFLUCAN) 150 MG tablet Take 1 tablet (150 mg total) by mouth daily. (Patient taking differently: Take 150 mg by mouth as needed. )  . fluticasone (FLONASE) 50 MCG/ACT nasal spray Use 1-2 sprays in each nostril 1-2 times daily as needed for stuffy nose or drainage.  Marland Kitchen glucose blood (ONE TOUCH ULTRA TEST) test strip Check Blood sugar  daily  . INVOKANA 300 MG TABS tablet TAKE 1 TABLET (300 MG TOTAL) BY MOUTH DAILY BEFORE BREAKFAST.  Marland Kitchen Lancets (ONETOUCH ULTRASOFT) lancets Check Blood sugar  daily  . losartan (COZAAR) 100 MG tablet TAKE 1 TABLET DAILY FOR BP AND KIDNEY PROTECTION  . Magnesium 250 MG TABS Take 250 mg by mouth 2 (two) times daily.  . metFORMIN (GLUCOPHAGE-XR) 500 MG 24 hr tablet TAKE 2 TABLETS BY  MOUTH TWICE DAILY WITH FOOD FOR DIABETES  . metoprolol succinate (TOPROL-XL) 50 MG 24 hr tablet Take 1 tablet (50 mg total) by mouth daily. Take with or immediately following a meal.  . Multiple Vitamins-Minerals (MULTIVITAMIN PO) Take 1 tablet by mouth daily.   . Omega-3 Fatty Acids (FISH OIL PO) Take by mouth daily.  Marland Kitchen PARoxetine (PAXIL) 40 MG tablet Take 1 tablet (40 mg total) by mouth daily.  Marland Kitchen PRESCRIPTION MEDICATION Inject as directed every 30 (thirty) days. Allergy Shot  . promethazine (PHENERGAN) 25 MG tablet Take 1 tablet (25 mg total) by mouth every 6 (six) hours as needed for nausea or vomiting (can cause fatigue). Max: 4 tablets per day  . rosuvastatin (CRESTOR) 20 MG tablet TAKE 1 TABLET BY MOUTH EVERYDAY AT BEDTIME  .  SYNTHROID 200 MCG tablet TAKE 1 TABLET DAILY ON AN EMPTY STOMACH FOR 30 MINUTES OR AS DIRECTED.  Marland Kitchen topiramate (TOPAMAX) 50 MG tablet TAKE 1 TABLET BY MOUTH EVERYDAY AT BEDTIME  . traZODone (DESYREL) 150 MG tablet TAKE 1/2 TO 1 TABLET 1 HOUR BEFORE SLEEP  . aspirin 81 MG tablet Take 81 mg by mouth daily.   No current facility-administered medications on file prior to visit.    Allergies  Allergen Reactions  . Floxin [Ofloxacin] Hives  . Viberzi [Eluxadoline] Hives  . Doxycycline Rash   PMHx:   Past Medical History:  Diagnosis Date  . Allergy   . Anxiety   . Arthritis   . Asthma   . Chronic kidney disease (CKD), stage II (mild) 07/01/2014   Overview:  Overview:  Due to DM, last GFR 64   . Depression   . Fibromyalgia   . GERD (gastroesophageal reflux disease)   . Hyperlipidemia   . Hypertension   . Liver disease   . Migraine 02/19/2015  . Migraines   . Obesity   . Sleep apnea   . Sleep difficulties   . Thyroid disease    hypothyroidism  . Type II or unspecified type diabetes mellitus without mention of complication, not stated as uncontrolled   . Urticaria   . Ventral hernia   . Vertigo 02/19/2015   Immunization History  Administered Date(s) Administered  . Influenza, High Dose Seasonal PF 08/10/2015, 08/10/2016, 08/28/2017, 11/07/2018  . Pneumococcal Conjugate-13 07/01/2014  . Pneumococcal Polysaccharide-23 10/09/2002, 05/26/2008, 12/13/2016  . Td 10/09/2002, 10/30/2010  . Tdap 08/10/2015  . Zoster 06/20/2011   Past Surgical History:  Procedure Laterality Date  . ABDOMINAL HYSTERECTOMY    . ADENOIDECTOMY    . CARPAL TUNNEL RELEASE     bilateral  . CESAREAN SECTION     x2  . CHOLECYSTECTOMY    . HERNIA REPAIR  07/24/11   ventral hernia  . JOINT REPLACEMENT    . KNEE ARTHROSCOPY     bilateral  . SINOSCOPY    . TONSILLECTOMY    . TOTAL KNEE ARTHROPLASTY     bilateral   FHx:    Reviewed / unchanged SHx:    Reviewed / unchanged   Systems Review:   Constitutional: Denies fever, chills, wt changes, headaches, insomnia, fatigue, night sweats, change in appetite. Eyes: Denies redness, blurred vision, diplopia, discharge, itchy, watery eyes.  ENT: Denies discharge, congestion, post nasal drip, epistaxis, sore throat, earache, hearing loss, dental pain, tinnitus, vertigo, sinus pain, snoring.  CV: Denies chest pain, palpitations, irregular heartbeat, syncope, dyspnea, diaphoresis, orthopnea, PND, claudication or edema. Respiratory: denies cough, dyspnea, DOE, pleurisy, hoarseness, laryngitis, wheezing.  Gastrointestinal: Denies dysphagia, odynophagia,  heartburn, reflux, water brash, abdominal pain or cramps, nausea, vomiting, bloating, diarrhea, constipation, hematemesis, melena, hematochezia  or hemorrhoids. Genitourinary: Denies dysuria, frequency, urgency, nocturia, hesitancy, discharge, hematuria or flank pain. Musculoskeletal: Denies arthralgias, myalgias, stiffness, jt. swelling, pain, limping or strain/sprain.  Skin: Denies pruritus, rash, hives, warts, acne, eczema or change in skin lesion(s). Neuro: No weakness, tremor, incoordination, spasms, paresthesia or pain. Psychiatric: Denies confusion, memory loss or sensory loss. Endo: Denies change in weight, skin or hair change.  Heme/Lymph: No excessive bleeding, bruising or enlarged lymph nodes.  Physical Exam  BP 124/80   Pulse 76   Temp (!) 97.2 F (36.2 C)   Resp 16   Ht 5' 2.5" (1.588 m)   Wt 212 lb 9.6 oz (96.4 kg)   BMI 38.27 kg/m   Appears  well nourished, well groomed  and in no distress.  Eyes: PERRLA, EOMs, conjunctiva no swelling or erythema. Sinuses: No frontal/maxillary tenderness ENT/Mouth: EAC's clear, TM's nl w/o erythema, bulging. Nares clear w/o erythema, swelling, exudates. Oropharynx clear without erythema or exudates. Oral hygiene is good. Tongue normal, non obstructing. Hearing intact.  Neck: Supple. Thyroid not palpable. Car 2+/2+ without bruits, nodes or  JVD. Chest: Respirations nl with BS clear & equal w/o rales, rhonchi, wheezing or stridor.  Cor: Heart sounds normal w/ regular rate and rhythm without sig. murmurs, gallops, clicks or rubs. Peripheral pulses normal and equal  without edema.  Abdomen: Soft & bowel sounds normal. Non-tender w/o guarding, rebound, hernias, masses or organomegaly.  Lymphatics: Unremarkable.  Musculoskeletal: Full ROM all peripheral extremities, joint stability, 5/5 strength and normal gait.  Skin: Warm, dry without exposed rashes, lesions or ecchymosis apparent.  Neuro: Cranial nerves intact, reflexes equal bilaterally. Sensory-motor testing grossly intact. Tendon reflexes grossly intact.  Pysch: Alert & oriented x 3.  Insight and judgement nl & appropriate. No ideations.  Assessment and Plan:  1. Essential hypertension  - Continue medication, monitor blood pressure at home.  - Continue DASH diet.  Reminder to go to the ER if any CP,  SOB, nausea, dizziness, severe HA, changes vision/speech.  - CBC with Differential/Platelet - COMPLETE METABOLIC PANEL WITH GFR - Magnesium - TSH  2. Hyperlipidemia, mixed  - Continue diet/meds, exercise,& lifestyle modifications.  - Continue monitor periodic cholesterol/liver & renal functions   - Lipid panel - TSH  3. Type 2 diabetes mellitus with stage 3 chronic kidney disease, without long-term current use of insulin (HCC)  - Continue diet, exercise  - Lifestyle modifications.  - Monitor appropriate labs.  - Hemoglobin A1c - Insulin, random  4. Vitamin D deficiency disease  - Continue supplementation.   - VITAMIN D 25 Hydroxyl  5. Hypothyroidism  - TSH  6. Medication management  - CBC with Differential/Platelet - COMPLETE METABOLIC PANEL WITH GFR - Magnesium - Lipid panel - TSH - Hemoglobin A1c - Insulin, random      Discussed  regular exercise, BP monitoring, weight control to achieve/maintain BMI less than 25 and discussed med and SE's.  Recommended labs to assess and monitor clinical status with further disposition pending results of labs. I discussed the assessment and treatment plan with the patient. The patient was provided an opportunity to ask questions and all were answered. The patient agreed with the plan and demonstrated an understanding of the instructions. I provided  over 40 minutes of exam, counseling, chart review and  complex critical decision making was performed.      Kirtland Bouchard, MD

## 2019-02-19 LAB — COMPLETE METABOLIC PANEL WITH GFR
AG Ratio: 1.9 (calc) (ref 1.0–2.5)
ALT: 21 U/L (ref 6–29)
AST: 18 U/L (ref 10–35)
Albumin: 4.5 g/dL (ref 3.6–5.1)
Alkaline phosphatase (APISO): 83 U/L (ref 37–153)
BUN/Creatinine Ratio: 14 (calc) (ref 6–22)
BUN: 15 mg/dL (ref 7–25)
CO2: 25 mmol/L (ref 20–32)
Calcium: 9.7 mg/dL (ref 8.6–10.4)
Chloride: 103 mmol/L (ref 98–110)
Creat: 1.09 mg/dL — ABNORMAL HIGH (ref 0.50–0.99)
GFR, Est African American: 60 mL/min/{1.73_m2} (ref >=60)
GFR, Est Non African American: 52 mL/min/{1.73_m2} — ABNORMAL LOW (ref >=60)
Globulin: 2.4 g/dL (calc) (ref 1.9–3.7)
Glucose, Bld: 96 mg/dL (ref 65–99)
Potassium: 4.2 mmol/L (ref 3.5–5.3)
Sodium: 139 mmol/L (ref 135–146)
Total Bilirubin: 0.7 mg/dL (ref 0.2–1.2)
Total Protein: 6.9 g/dL (ref 6.1–8.1)

## 2019-02-19 LAB — HEMOGLOBIN A1C
Hgb A1c MFr Bld: 6.1 % of total Hgb — ABNORMAL HIGH (ref <5.7)
Mean Plasma Glucose: 128 (calc)
eAG (mmol/L): 7.1 (calc)

## 2019-02-19 LAB — CBC WITH DIFFERENTIAL/PLATELET
Absolute Monocytes: 710 cells/uL (ref 200–950)
Basophils Absolute: 55 cells/uL (ref 0–200)
Basophils Relative: 0.6 %
Eosinophils Absolute: 291 cells/uL (ref 15–500)
Eosinophils Relative: 3.2 %
HCT: 45.1 % — ABNORMAL HIGH (ref 35.0–45.0)
Hemoglobin: 15.3 g/dL (ref 11.7–15.5)
Lymphs Abs: 3058 cells/uL (ref 850–3900)
MCH: 29.8 pg (ref 27.0–33.0)
MCHC: 33.9 g/dL (ref 32.0–36.0)
MCV: 87.7 fL (ref 80.0–100.0)
MPV: 10.4 fL (ref 7.5–12.5)
Monocytes Relative: 7.8 %
Neutro Abs: 4987 cells/uL (ref 1500–7800)
Neutrophils Relative %: 54.8 %
Platelets: 284 10*3/uL (ref 140–400)
RBC: 5.14 10*6/uL — ABNORMAL HIGH (ref 3.80–5.10)
RDW: 12.4 % (ref 11.0–15.0)
Total Lymphocyte: 33.6 %
WBC: 9.1 10*3/uL (ref 3.8–10.8)

## 2019-02-19 LAB — LIPID PANEL
Cholesterol: 129 mg/dL (ref <200)
HDL: 41 mg/dL — ABNORMAL LOW (ref >=50)
LDL Cholesterol (Calc): 61 mg/dL (calc)
Non-HDL Cholesterol (Calc): 88 mg/dL (calc) (ref <130)
Total CHOL/HDL Ratio: 3.1 (calc) (ref <5.0)
Triglycerides: 208 mg/dL — ABNORMAL HIGH (ref <150)

## 2019-02-19 LAB — INSULIN, RANDOM: Insulin: 15.9 u[IU]/mL

## 2019-02-19 LAB — VITAMIN D 25 HYDROXY (VIT D DEFICIENCY, FRACTURES): Vit D, 25-Hydroxy: 98 ng/mL (ref 30–100)

## 2019-02-19 LAB — MAGNESIUM: Magnesium: 2 mg/dL (ref 1.5–2.5)

## 2019-02-19 LAB — TSH: TSH: 0.35 mIU/L — ABNORMAL LOW (ref 0.40–4.50)

## 2019-04-14 ENCOUNTER — Encounter: Payer: Self-pay | Admitting: Allergy & Immunology

## 2019-04-15 ENCOUNTER — Other Ambulatory Visit: Payer: Self-pay | Admitting: Physician Assistant

## 2019-04-15 DIAGNOSIS — M25569 Pain in unspecified knee: Secondary | ICD-10-CM

## 2019-04-28 ENCOUNTER — Other Ambulatory Visit: Payer: Self-pay | Admitting: Neurology

## 2019-04-28 ENCOUNTER — Other Ambulatory Visit: Payer: Self-pay | Admitting: Adult Health

## 2019-05-11 ENCOUNTER — Other Ambulatory Visit: Payer: Self-pay | Admitting: Internal Medicine

## 2019-05-11 DIAGNOSIS — I10 Essential (primary) hypertension: Secondary | ICD-10-CM

## 2019-05-21 ENCOUNTER — Encounter: Payer: Self-pay | Admitting: Physician Assistant

## 2019-06-09 NOTE — Progress Notes (Signed)
CPE AND FOLLOW UP  Assessment:    Diaphoresis with fatigue and EKG changes in obese 68 year old diabetic with HTN, chol, OSA and family history -     Ambulatory referral to Cardiology EKG with IRBBB and NEW Twave inversion V2-V6 with mild ST depression V4-V6 possible strain pattern and T wave flattentin I,II, and LAE. With symptoms and risk factors will refer to cardiology for evaluation before starting on phentermine for weight loss and exercise program.   Myalgia -     C-reactive protein -     CK -     B. burgdorfi antibodies - check labs  Encounter for general adult medical examination with abnormal findings 1 year  Essential hypertension - continue medications, DASH diet, exercise and monitor at home. Call if greater than 130/80.  -     CBC with Differential/Platelet -     COMPLETE METABOLIC PANEL WITH GFR -     TSH -     Urinalysis, Routine w reflex microscopic -     Microalbumin / creatinine urine ratio -     EKG 12-Lead -     DG Chest 2 View; Future  Type 2 diabetes mellitus with other diabetic kidney complication, without long-term current use of insulin ([place]) Discussed general issues about diabetes pathophysiology and management., Educational material distributed., Suggested low cholesterol diet., Encouraged aerobic exercise., Discussed foot care., Reminded to get yearly retinal exam. -     Hemoglobin A1c -     Ambulatory referral to Cardiology  Depression, major, recurrent, in remission ([place]) Continue medications  Obstructive sleep apnea ? Need to repeat in lab sleep study Continue mouth piece and following with Dr. Toy Cookey for now  Morbid obesity [facility]) - follow up 3 months for progress monitoring - increase veggies, decrease carbs - long discussion about weight loss, diet, and exercise  CKD stage 2 due to type 2 diabetes mellitus ([place]) Discussed general issues about diabetes pathophysiology and management., Educational material distributed., Suggested low  cholesterol diet., Encouraged aerobic exercise., Discussed foot care., Reminded to get yearly retinal exam.  Hyperlipidemia associated with type 2 diabetes mellitus ([place]) -     Lipid panel check lipids decrease fatty foods increase activity.   Hypothyroidism, unspecified type -     TSH Hypothyroidism-check TSH level, continue medications the same, reminded to take on an empty stomach 30-69mins before food.   Migraine without aura and without status migrainosus, not intractable Continue follow up neuro  Fatty liver Check labs, avoid tylenol, alcohol, weight loss advised.   Vitamin D deficiency disease -     VITAMIN D 25 Hydroxy (Vit-D Deficiency, Fractures)  Medication management -     Magnesium  Mild intermittent asthma without complication Since being on allergy shots, no issues or exacerbations  Osteopenia, unspecified location Repeat DEXA  2years  Vertigo Improved  Gastroesophageal reflux disease, esophagitis presence not specified Continue PPI/H2 blocker, diet discussed  Arthritis Weight loss advised, will start exercise program after cardiology cleared  History of colonic polyps  Hearing loss of left ear, unspecified hearing loss type  Seasonal and perennial allergic rhinitis Continue follow up for allergy shots  Iron deficiency anemia, unspecified iron deficiency anemia type -     Iron,Total/Total Iron Binding Cap  B12 deficiency -     Vitamin B12  Over 30 minutes of exam, counseling, chart review, and critical decision making was performed  Future Appointments  Date Time Provider [place]  07/02/2019 11:15 AM Suzzanne Cloud, NP [facility] None  11/17/2019  2:00 PM Liane Comber, NP [facility] None  06/14/2020  2:00 PM Vicie Mutters, PA-C [facility] None     Plan:   During the course of the visit the patient was educated and counseled about appropriate screening and preventive services including:    Pneumococcal vaccine   Influenza  vaccine  Td vaccine  Screening electrocardiogram  Screening mammography  Bone densitometry screening  Colorectal cancer screening  Diabetes screening  Glaucoma screening  Nutrition counseling   Advanced directives: given info/requested   Subjective:   Grace Barry is a 68 y.o. female who presents for Welcome to medicare visit and 3 month follow up on hypertension, prediabetes, hyperlipidemia, vitamin D def.   She has followed with Dr. Toy Cookey and is on a mouth piece for OSA switched from CPAP 2016, Last original sleep study was 2006. She states she feels like she sleeps okay but she will wake up several times in the night. She will have episodes of fatigue, feeling her legs are heavy and feels like she is taking very deep breaths, 3-4 times a week, will have to take a nap for an hour in her chair and states feel better after that. Will check her sugars and will be in normal range. States nothing causes it. She is rather inactive but states she has visited her kids in [place] and no issues with [place].   She has history of migraines, she has been following with Dr. Jannifer Franklin for balance and mental fogging and essential tremor worse with stress.    BMI is Body mass index is 38.19 kg/m., she has not been working on diet and exercise. She eats 1-2 meals a day. She emotionally and bored eat.  Wt Readings from Last 3 Encounters:  06/10/19 219 lb (99.3 kg)  02/18/19 212 lb 9.6 oz (96.4 kg)  12/30/18 210 lb 12.8 oz (95.6 kg)   Her blood pressure has been controlled at home,  Metoprolol 50mg  XL for palpitations, losartan  100 mg,BP: 120/80  She does workout. She denies chest pain, shortness of breath, dizziness.   She is on cholesterol medication, crestor 20mg  daily and denies myalgias. Her cholesterol is at goal. The cholesterol was:   Lab Results  Component Value Date   CHOL 129 02/18/2019   HDL 41 (L) 02/18/2019   LDLCALC 61 02/18/2019   TRIG 208 (H) 02/18/2019    CHOLHDL 3.1 02/18/2019   She has been working on diet and exercise for Diabetes controlled on metformin and invokana with diabetic chronic kidney disease, she is on bASA, she is on ACE/ARB, and denies paresthesia of the feet, polydipsia, polyuria and visual disturbances. Last A1C was:  Lab Results  Component Value Date   HGBA1C 6.1 (H) 02/18/2019   Patient is on Vitamin D supplement. Lab Results  Component Value Date   VD25OH 98 02/18/2019   She is on thyroid medication. Her medication was not changed last visit,.  Lab Results  Component Value Date   TSH 0.35 (L) 02/18/2019   Lab Results  Component Value Date   GFRNONAA 52 (L) 02/18/2019      Medication Review Current Outpatient Medications on File Prior to Visit  Medication Sig  . albuterol (PROAIR HFA) 108 (90 Base) MCG/ACT inhaler Use 2 puffs every four hours as needed for cough or wheeze.  May use 2 puffs 10-20 minutes prior to exercise.  Marland Kitchen albuterol (PROVENTIL) (2.5 MG/3ML) 0.083% nebulizer solution Take 3 mLs (2.5 mg total) by nebulization  every 4 (four) hours as needed for wheezing or shortness of breath.  Marland Kitchen aspirin 81 MG tablet Take 81 mg by mouth daily.  Marland Kitchen azelastine (ASTELIN) 0.1 % nasal spray Use 1-2 sprays in each nostril 1-2 times daily as needed for stuffy nose or drainage.  . baclofen (LIORESAL) 10 MG tablet Take 1 tablet (10 mg total) by mouth 2 (two) times daily as needed for muscle spasms.  Marland Kitchen CALCIUM PO Take 1,200 mg by mouth daily.  . cetirizine (ZYRTEC) 10 MG tablet Take 10 mg by mouth as needed. Reported on 10/20/2015  . EPINEPHrine (EPIPEN 2-PAK) 0.3 mg/0.3 mL IJ SOAJ injection Inject 0.3 mg into the muscle once.  . famotidine (PEPCID) 40 MG tablet Take 1 tablet (40 mg total) by mouth 2 (two) times daily.  . fluconazole (DIFLUCAN) 150 MG tablet Take 1 tablet (150 mg total) by mouth daily. (Patient taking differently: Take 150 mg by mouth as needed. )  . fluticasone (FLONASE) 50 MCG/ACT nasal spray Use 1-2  sprays in each nostril 1-2 times daily as needed for stuffy nose or drainage.  Marland Kitchen glucose blood (ONE TOUCH ULTRA TEST) test strip Check Blood sugar  daily  . INVOKANA 300 MG TABS tablet TAKE 1 TABLET (300 MG TOTAL) BY MOUTH DAILY BEFORE BREAKFAST.  Marland Kitchen Lancets (ONETOUCH ULTRASOFT) lancets Check Blood sugar  daily  . losartan (COZAAR) 100 MG tablet TAKE 1 TABLET DAILY FOR BP AND KIDNEY PROTECTION  . Magnesium 250 MG TABS Take 250 mg by mouth 2 (two) times daily.  . metFORMIN (GLUCOPHAGE-XR) 500 MG 24 hr tablet TAKE 2 TABLETS BY MOUTH TWICE DAILY WITH FOOD FOR DIABETES  . metoprolol succinate (TOPROL-XL) 50 MG 24 hr tablet TAKE 1 TABLET (50 MG TOTAL) BY MOUTH DAILY. TAKE WITH OR IMMEDIATELY FOLLOWING A MEAL.  . Multiple Vitamins-Minerals (MULTIVITAMIN PO) Take 1 tablet by mouth daily.   . Omega-3 Fatty Acids (FISH OIL PO) Take by mouth daily.  Marland Kitchen PARoxetine (PAXIL) 40 MG tablet Take 1 tablet (40 mg total) by mouth daily.  Marland Kitchen PRESCRIPTION MEDICATION Inject as directed every 30 (thirty) days. Allergy Shot  . promethazine (PHENERGAN) 25 MG tablet Take 1 tablet (25 mg total) by mouth every 6 (six) hours as needed for nausea or vomiting (can cause fatigue). Max: 4 tablets per day  . rosuvastatin (CRESTOR) 20 MG tablet TAKE 1 TABLET BY MOUTH EVERYDAY AT BEDTIME  . SYNTHROID 200 MCG tablet Take 1 tablet daily on an empty stomach with only water for 30 minutes & no Antacid meds, Calcium or Magnesium for 4 hours & avoid Biotin  . topiramate (TOPAMAX) 50 MG tablet TAKE 1 TABLET BY MOUTH EVERYDAY AT BEDTIME   No current facility-administered medications on file prior to visit.     Current Problems (verified) Patient Active Problem List   Diagnosis Date Noted  . Hyperlipidemia associated with type 2 diabetes mellitus ([place]) 11/07/2018  . Osteopenia 11/07/2018  . Seasonal and perennial allergic rhinitis 07/17/2018  . Mild intermittent asthma without complication 61/44/3154  . Left ear hearing loss 03/28/2016   . Vitamin D deficiency disease 05/19/2015  . Medication management 05/19/2015  . Migraine 02/19/2015  . Vertigo 02/19/2015  . History of colonic polyps 01/15/2015  . Morbid obesity ([place]) 07/01/2014  . CKD stage 2 due to type 2 diabetes mellitus ([place]) 07/01/2014  . Fatty liver   . Arthritis   . Hypothyroidism 12/15/2010  . T2_NIDDM w/CKD (GFR 56 ml/min) 12/15/2010  . Depression, major, recurrent, in remission ([place])  12/15/2010  . Obstructive sleep apnea 12/15/2010  . Essential hypertension 12/15/2010  . GERD 12/15/2010    Screening Tests Immunization History  Administered Date(s) Administered  . Influenza, High Dose Seasonal PF 08/10/2015, 08/10/2016, 08/28/2017, 11/07/2018  . Pneumococcal Conjugate-13 07/01/2014  . Pneumococcal Polysaccharide-23 10/09/2002, 05/26/2008, 12/13/2016  . Td 10/09/2002, 10/30/2010  . Tdap 08/10/2015  . Zoster 06/20/2011    Preventative care: Tetanus: 2016 Pneumovax: 2018 Prevnar 13: 2015 Flu vaccine: 2019 Zostavax: 2012  Pap: 2009 remote declines another MGM: 10/2018 DEXA: 2020 osteopenia -1.5 CXR 2013 Colonoscopy: 10/2014 due 2019 precancerous polyps Dr. Deatra Ina EGD: 10/2014 + gastritis MRI 11/20/2017 PFT 06/2018  Names of Other Physician/Practitioners you currently use: 1. [facility]- here for primary care 2. Dr. Wynetta Emery, [place], eye doctor, last visit 2019 will send out paper 3. Dr. Betsey Holiday, dentist, last visit q 6 months 4. Dr. Domingo Cocking, neuro 5. Dr. Toy Cookey, sleep apnea 6. Dr. Deatra Ina GI  Patient Care Team: Unk Pinto, MD as PCP - General (Internal Medicine) Jola Baptist, [place] as Anesthesiologist (Chiropractic Medicine)   Allergies Allergies  Allergen Reactions  . Floxin [Ofloxacin] Hives  . Viberzi [Eluxadoline] Hives  . Doxycycline Rash    SURGICAL HISTORY She  has a past surgical history that includes Total knee arthroplasty; Cholecystectomy; Abdominal hysterectomy; Cesarean  section; Tonsillectomy; Knee arthroscopy; Carpal tunnel release; Hernia repair (07/24/11); Joint replacement; Adenoidectomy; and Sinoscopy. FAMILY HISTORY Her family history includes Breast cancer in her mother; Colon cancer in her son; Esophageal cancer (age of onset: 54) in her son; Hyperlipidemia in her father and sister; Hypertension in her father; Prostate cancer in her father; Stomach cancer in her son. SOCIAL HISTORY She  reports that she has never smoked. She has never used smokeless tobacco. She reports current alcohol use. She reports that she does not use drugs.   Review of Systems:  Review of Systems  Constitutional: Positive for diaphoresis and malaise/fatigue. Negative for chills, fever and weight loss.  HENT: Negative for congestion, ear discharge, ear pain, hearing loss, nosebleeds, sinus pain, sore throat and tinnitus.   Eyes: Negative.   Respiratory: Positive for shortness of breath. Negative for stridor.   Cardiovascular: Negative.  Negative for chest pain.  Gastrointestinal: Negative for abdominal pain, blood in stool, constipation, diarrhea, heartburn, melena, nausea and vomiting.  Genitourinary: Negative.   Musculoskeletal: Positive for myalgias. Negative for falls.  Skin: Negative.   Neurological: Negative for dizziness, tingling, tremors, sensory change, speech change, focal weakness, seizures, loss of consciousness and headaches.  Endo/Heme/Allergies: Negative.   Psychiatric/Behavioral: Negative for depression, substance abuse and suicidal ideas. The patient is not nervous/anxious and does not have insomnia.      Objective:   Today's Vitals   06/10/19 1357  BP: 120/80  Pulse: 61  Temp: 98.7 F (37.1 C)  SpO2: 99%  Weight: 219 lb (99.3 kg)  Height: 5' 3.5" (1.613 m)   General appearance: alert, no distress, WD/WN,  female HEENT: normocephalic, sclerae anicteric, TMs pearly, nares patent, no discharge or erythema, pharynx normal Oral cavity: MMM, no  lesions Neck: supple, no lymphadenopathy, no thyromegaly, no masses Heart: RRR, normal S1, S2, no murmurs Lungs: CTA bilaterally, no wheezes, rhonchi, or rales Abdomen: +bs, soft, non tender, non distended, no masses, no hepatomegaly, no splenomegaly Musculoskeletal: nontender, no swelling, no obvious deformity Extremities: no edema, no cyanosis, no clubbing Pulses: 2+ symmetric, upper and lower extremities, normal cap refill Neurological: alert, oriented x 3, CN2-12 intact, strength normal upper extremities and lower  extremities, sensation normal throughout, DTRs 2+ throughout, no cerebellar signs, gait normal Psychiatric: normal affect, behavior normal, pleasant  Breast: defer Gyn: defer Rectal: defer  Vicie Mutters, PA-C   06/10/2019

## 2019-06-10 ENCOUNTER — Other Ambulatory Visit: Payer: Self-pay

## 2019-06-10 ENCOUNTER — Ambulatory Visit: Payer: Medicare Other | Admitting: Physician Assistant

## 2019-06-10 ENCOUNTER — Ambulatory Visit
Admission: RE | Admit: 2019-06-10 | Discharge: 2019-06-10 | Disposition: A | Discharge status: Home / Self Care | Payer: Medicare Other | Source: Ambulatory Visit | Attending: Physician Assistant | Admitting: Physician Assistant

## 2019-06-10 ENCOUNTER — Encounter: Payer: Self-pay | Admitting: Physician Assistant

## 2019-06-10 VITALS — BP 120/80 | HR 61 | Temp 98.7°F | Ht 63.5 in | Wt 219.0 lb

## 2019-06-10 DIAGNOSIS — R42 Dizziness and giddiness: Secondary | ICD-10-CM

## 2019-06-10 DIAGNOSIS — G4733 Obstructive sleep apnea (adult) (pediatric): Secondary | ICD-10-CM

## 2019-06-10 DIAGNOSIS — N182 Chronic kidney disease, stage 2 (mild): Secondary | ICD-10-CM

## 2019-06-10 DIAGNOSIS — J302 Other seasonal allergic rhinitis: Secondary | ICD-10-CM

## 2019-06-10 DIAGNOSIS — Z0001 Encounter for general adult medical examination with abnormal findings: Secondary | ICD-10-CM

## 2019-06-10 DIAGNOSIS — J4520 Mild intermittent asthma, uncomplicated: Secondary | ICD-10-CM

## 2019-06-10 DIAGNOSIS — I10 Essential (primary) hypertension: Secondary | ICD-10-CM

## 2019-06-10 DIAGNOSIS — K219 Gastro-esophageal reflux disease without esophagitis: Secondary | ICD-10-CM

## 2019-06-10 DIAGNOSIS — E559 Vitamin D deficiency, unspecified: Secondary | ICD-10-CM

## 2019-06-10 DIAGNOSIS — Z860100 Personal history of colon polyps, unspecified: Secondary | ICD-10-CM

## 2019-06-10 DIAGNOSIS — E1169 Type 2 diabetes mellitus with other specified complication: Secondary | ICD-10-CM

## 2019-06-10 DIAGNOSIS — K760 Fatty (change of) liver, not elsewhere classified: Secondary | ICD-10-CM

## 2019-06-10 DIAGNOSIS — M8580 Other specified disorders of bone density and structure, unspecified site: Secondary | ICD-10-CM

## 2019-06-10 DIAGNOSIS — D509 Iron deficiency anemia, unspecified: Secondary | ICD-10-CM

## 2019-06-10 DIAGNOSIS — M791 Myalgia, unspecified site: Secondary | ICD-10-CM

## 2019-06-10 DIAGNOSIS — E538 Deficiency of other specified B group vitamins: Secondary | ICD-10-CM

## 2019-06-10 DIAGNOSIS — F3340 Major depressive disorder, recurrent, in remission, unspecified: Secondary | ICD-10-CM

## 2019-06-10 DIAGNOSIS — H9192 Unspecified hearing loss, left ear: Secondary | ICD-10-CM

## 2019-06-10 DIAGNOSIS — G43009 Migraine without aura, not intractable, without status migrainosus: Secondary | ICD-10-CM

## 2019-06-10 DIAGNOSIS — Z136 Encounter for screening for cardiovascular disorders: Secondary | ICD-10-CM | POA: Diagnosis not present

## 2019-06-10 DIAGNOSIS — Z79899 Other long term (current) drug therapy: Secondary | ICD-10-CM

## 2019-06-10 DIAGNOSIS — E1122 Type 2 diabetes mellitus with diabetic chronic kidney disease: Secondary | ICD-10-CM

## 2019-06-10 DIAGNOSIS — M1990 Unspecified osteoarthritis, unspecified site: Secondary | ICD-10-CM

## 2019-06-10 DIAGNOSIS — Z0000 Encounter for general adult medical examination without abnormal findings: Secondary | ICD-10-CM | POA: Diagnosis not present

## 2019-06-10 DIAGNOSIS — R61 Generalized hyperhidrosis: Secondary | ICD-10-CM

## 2019-06-10 DIAGNOSIS — E1129 Type 2 diabetes mellitus with other diabetic kidney complication: Secondary | ICD-10-CM

## 2019-06-10 DIAGNOSIS — E039 Hypothyroidism, unspecified: Secondary | ICD-10-CM

## 2019-06-10 NOTE — Patient Instructions (Addendum)
Going to refer for colonoscopy  Colon cancer is 3rd most diagnosed cancer and 2nd leading cause of death in both men and women 70 years of age and older despite being one of the most preventable and treatable cancers if found early.  4 of out 5 people diagnosed with colon cancer have NO prior family history.  When caught EARLY 90% of colon cancer is curable.  INFORMATION ABOUT YOUR XRAY  Can walk into [address] building for an Insurance account manager. They will have the order and take you back. You do not any paper work, I should get the result back today or tomorrow. This order is good for a year.  Can call [phone] to schedule an appointment if you wish.    Weakness Weakness is a lack of strength. You may feel weak all over your body (generalized), or you may feel weak in one specific part of your body (focal). Common causes of weakness include:  Infection and immune system disorders.  Physical exhaustion.  Internal bleeding or other blood loss that results in a lack of red blood cells (anemia).  Dehydration.  An imbalance in mineral (electrolyte) levels, such as potassium.  Heart disease, circulation problems, or stroke. Other causes include:  Some medicines or cancer treatment.  Stress, anxiety, or depression.  Nervous system disorders.  Thyroid disorders.  Loss of muscle strength because of age or inactivity.  Poor sleep quality or sleep disorders. The cause of your weakness may not be known. Some causes of weakness can be serious, so it is important to see your health care provider. Follow these instructions at home: Activity  Rest as needed.  Try to get enough sleep. Most adults need 7-8 hours of quality sleep each night. Talk to your health care provider about how much sleep you need each night.  Do exercises, such as arm curls and leg raises, for 30 minutes at least 2 days a week or as told by your health care provider. This helps build muscle strength.  Consider  working with a physical therapist or trainer who can develop an exercise plan to help you gain muscle strength. General instructions   Take over-the-counter and prescription medicines only as told by your health care provider.  Eat a healthy, well-balanced diet. This includes: ? Proteins to build muscles, such as lean meats and fish. ? Fresh fruits and vegetables. ? Carbohydrates to boost energy, such as whole grains.  Drink enough fluid to keep your urine pale yellow.  Keep all follow-up visits as told by your health care provider. This is important. Contact a health care provider if your weakness:  Does not improve or gets worse.  Affects your ability to think clearly.  Affects your ability to do your normal daily activities. Get help right away if you:  Develop sudden weakness, especially on one side of your face or body.  Have chest pain.  Have trouble breathing or shortness of breath.  Have problems with your vision.  Have trouble talking or swallowing.  Have trouble standing or walking.  Are light-headed or lose consciousness. Summary  Weakness is a lack of strength. You may feel weak all over your body or just in one specific part of your body.  Weakness can be caused by a variety of things. In some cases, the cause may be unknown.  Rest as needed, and try to get enough sleep. Most adults need 7-8 hours of quality sleep each night.  Eat a healthy, well-balanced diet. This information is  not intended to replace advice given to you by your health care provider. Make sure you discuss any questions you have with your health care provider. Document Released: 10/16/2005 Document Revised: 05/22/2018 Document Reviewed: 05/22/2018 Elsevier Patient Education  2020 [employer].

## 2019-06-13 LAB — COMPLETE METABOLIC PANEL WITH GFR
AG Ratio: 1.8 (calc) (ref 1.0–2.5)
ALT: 31 U/L — ABNORMAL HIGH (ref 6–29)
AST: 24 U/L (ref 10–35)
Albumin: 4.4 g/dL (ref 3.6–5.1)
Alkaline phosphatase (APISO): 79 U/L (ref 37–153)
BUN: 13 mg/dL (ref 7–25)
CO2: 24 mmol/L (ref 20–32)
Calcium: 9.9 mg/dL (ref 8.6–10.4)
Chloride: 105 mmol/L (ref 98–110)
Creat: 0.96 mg/dL (ref 0.50–0.99)
GFR, Est African American: 70 mL/min/{1.73_m2} (ref >=60)
GFR, Est Non African American: 60 mL/min/{1.73_m2} (ref >=60)
Globulin: 2.4 g/dL (calc) (ref 1.9–3.7)
Glucose, Bld: 92 mg/dL (ref 65–99)
Potassium: 4.2 mmol/L (ref 3.5–5.3)
Sodium: 140 mmol/L (ref 135–146)
Total Bilirubin: 0.6 mg/dL (ref 0.2–1.2)
Total Protein: 6.8 g/dL (ref 6.1–8.1)

## 2019-06-13 LAB — MAGNESIUM: Magnesium: 1.8 mg/dL (ref 1.5–2.5)

## 2019-06-13 LAB — CK: Total CK: 36 U/L (ref 29–143)

## 2019-06-13 LAB — CBC WITH DIFFERENTIAL/PLATELET
Absolute Monocytes: 690 cells/uL (ref 200–950)
Basophils Absolute: 62 cells/uL (ref 0–200)
Basophils Relative: 0.6 %
Eosinophils Absolute: 206 cells/uL (ref 15–500)
Eosinophils Relative: 2 %
HCT: 44.4 % (ref 35.0–45.0)
Hemoglobin: 14.7 g/dL (ref 11.7–15.5)
Lymphs Abs: 3904 cells/uL — ABNORMAL HIGH (ref 850–3900)
MCH: 29.2 pg (ref 27.0–33.0)
MCHC: 33.1 g/dL (ref 32.0–36.0)
MCV: 88.3 fL (ref 80.0–100.0)
MPV: 10.8 fL (ref 7.5–12.5)
Monocytes Relative: 6.7 %
Neutro Abs: 5438 cells/uL (ref 1500–7800)
Neutrophils Relative %: 52.8 %
Platelets: 277 10*3/uL (ref 140–400)
RBC: 5.03 10*6/uL (ref 3.80–5.10)
RDW: 13.4 % (ref 11.0–15.0)
Total Lymphocyte: 37.9 %
WBC: 10.3 10*3/uL (ref 3.8–10.8)

## 2019-06-13 LAB — URINALYSIS, ROUTINE W REFLEX MICROSCOPIC
Bacteria, UA: NONE SEEN /HPF
Bilirubin Urine: NEGATIVE
Hyaline Cast: NONE SEEN /LPF
Ketones, ur: NEGATIVE
Leukocytes,Ua: NEGATIVE
Nitrite: NEGATIVE
Protein, ur: NEGATIVE
RBC / HPF: NONE SEEN /HPF (ref 0–2)
Specific Gravity, Urine: 1.013 (ref 1.001–1.03)
Squamous Epithelial / HPF: NONE SEEN /HPF (ref <=5)
WBC, UA: NONE SEEN /HPF (ref 0–5)
pH: 5.5 (ref 5.0–8.0)

## 2019-06-13 LAB — MICROALBUMIN / CREATININE URINE RATIO
Creatinine, Urine: 39 mg/dL (ref 20–275)
Microalb, Ur: <0.2 mg/dL

## 2019-06-13 LAB — B. BURGDORFI ANTIBODIES: B burgdorferi Ab IgG+IgM: <0.9 index

## 2019-06-13 LAB — VITAMIN D 25 HYDROXY (VIT D DEFICIENCY, FRACTURES): Vit D, 25-Hydroxy: 86 ng/mL (ref 30–100)

## 2019-06-13 LAB — HEMOGLOBIN A1C
Hgb A1c MFr Bld: 6.4 % of total Hgb — ABNORMAL HIGH (ref <5.7)
Mean Plasma Glucose: 137 (calc)
eAG (mmol/L): 7.6 (calc)

## 2019-06-13 LAB — IRON, TOTAL/TOTAL IRON BINDING CAP
%SAT: 21 % (calc) (ref 16–45)
Iron: 79 ug/dL (ref 45–160)
TIBC: 377 mcg/dL (calc) (ref 250–450)

## 2019-06-13 LAB — C-REACTIVE PROTEIN: CRP: 1.3 mg/L (ref <8.0)

## 2019-06-13 LAB — LIPID PANEL
Cholesterol: 123 mg/dL (ref <200)
HDL: 42 mg/dL — ABNORMAL LOW (ref >=50)
LDL Cholesterol (Calc): 55 mg/dL (calc)
Non-HDL Cholesterol (Calc): 81 mg/dL (calc) (ref <130)
Total CHOL/HDL Ratio: 2.9 (calc) (ref <5.0)
Triglycerides: 189 mg/dL — ABNORMAL HIGH (ref <150)

## 2019-06-13 LAB — VITAMIN B12: Vitamin B-12: 338 pg/mL (ref 200–1100)

## 2019-06-13 LAB — TSH: TSH: 1.46 mIU/L (ref 0.40–4.50)

## 2019-06-17 ENCOUNTER — Telehealth: Payer: Self-pay | Admitting: *Deleted

## 2019-06-17 DIAGNOSIS — J3089 Other allergic rhinitis: Secondary | ICD-10-CM

## 2019-06-17 NOTE — Telephone Encounter (Signed)
Patient came in today to get an injection. Previously vials have expired.  Patient last injection was on 01/17/19 she was given 0.10 of Red vial and she was at every 4 weeks once at 0.50. Please advise on where you would like to restart.

## 2019-06-17 NOTE — Telephone Encounter (Signed)
Let's start at Red Vial 0.025 mL and advance on schedule A.  Thanks, Salvatore Marvel, MD [facility] [place]

## 2019-06-18 DIAGNOSIS — J301 Allergic rhinitis due to pollen: Secondary | ICD-10-CM

## 2019-06-18 NOTE — Progress Notes (Signed)
VIALS EXP 06-17-20 

## 2019-06-18 NOTE — Telephone Encounter (Signed)
Left message for patient to call the office

## 2019-06-18 NOTE — Telephone Encounter (Addendum)
Patient informed plan for restart and she agreed with plan.

## 2019-06-26 ENCOUNTER — Other Ambulatory Visit: Payer: Self-pay | Admitting: Adult Health

## 2019-06-26 NOTE — Progress Notes (Signed)
Cardiology Office Note:    Date:  06/27/2019   ID:  Grace Barry, DOB 09-10-1951, MRN [id number]  PCP:  Unk Pinto, MD  Cardiologist:  No primary care provider on file.  Electrophysiologist:  None   Referring MD: Vicie Mutters, PA-C   Chief complaint: chest pain  History of Present Illness:    Grace Barry is a 68 y.o. female with a hx of hypertension, hyperlipidemia, OSA, type 2 diabetes, hypothyroidism who was referred by Vicie Mutters, PA-C for evaluation of EKG abnormalities and chest pain.  Patient was seen by PCP with plans to start on phentermine for weight loss and an exercise program.  EKG was checked and showed new T wave inversions.  She was referred to cardiology for further evaluation.  She reports BPs under good control.  She had been walking in the pool at the [facility], currently not exercising since the [facility] closed.  Reports intermittent sharp stabbing pain in center of chest.  Occus ~once per week.  Can occur at rest or with exertion. Lasts ~1 minute and resolves.  Also reports dyspnea with minimal exertion.  Cannot walk up flight of stairs without having dyspnea.    Past Medical History:  Diagnosis Date  . Allergy   . Anxiety   . Arthritis   . Asthma   . Chronic kidney disease (CKD), stage II (mild) 07/01/2014   Overview:  Overview:  Due to DM, last GFR 64   . Depression   . Fibromyalgia   . GERD (gastroesophageal reflux disease)   . Hyperlipidemia   . Hypertension   . Liver disease   . Migraine 02/19/2015  . Migraines   . Obesity   . Sleep apnea   . Sleep difficulties   . Thyroid disease    hypothyroidism  . Type II or unspecified type diabetes mellitus without mention of complication, not stated as uncontrolled   . Urticaria   . Ventral hernia   . Vertigo 02/19/2015    Past Surgical History:  Procedure Laterality Date  . ABDOMINAL HYSTERECTOMY    . ADENOIDECTOMY    . CARPAL TUNNEL RELEASE     bilateral  . CESAREAN SECTION     x2  .  CHOLECYSTECTOMY    . HERNIA REPAIR  07/24/11   ventral hernia  . JOINT REPLACEMENT    . KNEE ARTHROSCOPY     bilateral  . SINOSCOPY    . TONSILLECTOMY    . TOTAL KNEE ARTHROPLASTY     bilateral    Current Medications: Current Meds  Medication Sig  . aspirin 81 MG tablet Take 81 mg by mouth daily.  . baclofen (LIORESAL) 10 MG tablet Take 1 tablet (10 mg total) by mouth 2 (two) times daily as needed for muscle spasms.  Marland Kitchen CALCIUM PO Take 1,200 mg by mouth daily.  . cetirizine (ZYRTEC) 10 MG tablet Take 10 mg by mouth as needed. Reported on 10/20/2015  . EPINEPHrine (EPIPEN 2-PAK) 0.3 mg/0.3 mL IJ SOAJ injection Inject 0.3 mg into the muscle once.  . famotidine (PEPCID) 40 MG tablet Take 1 tablet (40 mg total) by mouth 2 (two) times daily.  . fluconazole (DIFLUCAN) 150 MG tablet Take 1 tablet (150 mg total) by mouth daily. (Patient taking differently: Take 150 mg by mouth as needed. )  . glucose blood (ONE TOUCH ULTRA TEST) test strip Check Blood sugar  daily  . INVOKANA 300 MG TABS tablet TAKE 1 TABLET (300 MG TOTAL) BY MOUTH DAILY BEFORE BREAKFAST.  Marland Kitchen  Lancets (ONETOUCH ULTRASOFT) lancets Check Blood sugar  daily  . losartan (COZAAR) 100 MG tablet TAKE 1 TABLET DAILY FOR BP AND KIDNEY PROTECTION  . Magnesium 250 MG TABS Take 250 mg by mouth 2 (two) times daily.  . metFORMIN (GLUCOPHAGE-XR) 500 MG 24 hr tablet TAKE 2 TABLETS BY MOUTH TWICE DAILY WITH FOOD FOR DIABETES  . metoprolol succinate (TOPROL-XL) 50 MG 24 hr tablet TAKE 1 TABLET (50 MG TOTAL) BY MOUTH DAILY. TAKE WITH OR IMMEDIATELY FOLLOWING A MEAL.  . Multiple Vitamins-Minerals (MULTIVITAMIN PO) Take 1 tablet by mouth daily.   . Omega-3 Fatty Acids (FISH OIL PO) Take by mouth daily.  Marland Kitchen PARoxetine (PAXIL) 40 MG tablet Take 1 tablet (40 mg total) by mouth daily.  Marland Kitchen PRESCRIPTION MEDICATION Inject as directed every 30 (thirty) days. Allergy Shot  . rosuvastatin (CRESTOR) 20 MG tablet TAKE 1 TABLET BY MOUTH EVERYDAY AT BEDTIME  .  SYNTHROID 200 MCG tablet Take 1 tablet daily on an empty stomach with only water for 30 minutes & no Antacid meds, Calcium or Magnesium for 4 hours & avoid Biotin  . topiramate (TOPAMAX) 50 MG tablet TAKE 1 TABLET BY MOUTH EVERYDAY AT BEDTIME  . Turmeric (QC TUMERIC COMPLEX PO) Take by mouth.  . Vitamin D, Ergocalciferol, (DRISDOL) 1.25 MG (50000 UT) CAPS capsule TAKE 1 CAPSULE 4 X /WEEK FOR VIT D DEFICIENCY     Allergies:   Floxin [ofloxacin], Viberzi [eluxadoline], and Doxycycline   Social History   Socioeconomic History  . Marital status: Married    Spouse name: Francee Piccolo  . Number of children: 2  . Years of education: masters  . Highest education level: Not on file  Occupational History  . Occupation: Retired Tour manager  . Financial resource strain: Not on file  . Food insecurity    Worry: Not on file    Inability: Not on file  . Transportation needs    Medical: Not on file    Non-medical: Not on file  Tobacco Use  . Smoking status: Never Smoker  . Smokeless tobacco: Never Used  Substance and Sexual Activity  . Alcohol use: Yes    Alcohol/week: 0.0 standard drinks    Comment: occasional  . Drug use: No  . Sexual activity: Not on file  Lifestyle  . Physical activity    Days per week: Not on file    Minutes per session: Not on file  . Stress: Not on file  Relationships  . Social Herbalist on phone: Not on file    Gets together: Not on file    Attends religious service: Not on file    Active member of club or organization: Not on file    Attends meetings of clubs or organizations: Not on file    Relationship status: Not on file  Other Topics Concern  . Not on file  Social History Narrative   Lives with husband, Francee Piccolo   Patient is right handed.   Patient drinks 64 oz of diet soda daily.     Family History: The patient'sfamily history includes Breast cancer in her mother; Colon cancer in her son; Esophageal cancer (age of onset: 43) in her  son; Hyperlipidemia in her father and sister; Hypertension in her father; Prostate cancer in her father; Stomach cancer in her son. There is no history of Colon polyps, Diabetes, Kidney disease, or Migraines.  ROS:   Please see the history of present illness.    All other systems  reviewed and are negative.  EKGs/Labs/Other Studies Reviewed:    The following studies were reviewed today:   EKG:  EKG is ordered today.  The ekg ordered today demonstrates normal sinus rhythm, <1 mm ST depressions/TWI in V4-6.  Recent Labs: 06/10/2019: ALT 31; BUN 13; Creat 0.96; Hemoglobin 14.7; Magnesium 1.8; Platelets 277; Potassium 4.2; Sodium 140; TSH 1.46  Recent Lipid Panel    Component Value Date/Time   CHOL 123 06/10/2019 1504   TRIG 189 (H) 06/10/2019 1504   HDL 42 (L) 06/10/2019 1504   CHOLHDL 2.9 06/10/2019 1504   VLDL 35 (H) 03/28/2017 1236   LDLCALC 55 06/10/2019 1504    Physical Exam:    VS:  BP 125/86   Pulse 83   Ht 5\' 3"  (1.6 m)   Wt 220 lb 6.4 oz (100 kg)   BMI 39.04 kg/m     Wt Readings from Last 3 Encounters:  06/27/19 220 lb 6.4 oz (100 kg)  06/10/19 219 lb (99.3 kg)  02/18/19 212 lb 9.6 oz (96.4 kg)     GEN: Well nourished, well developed in no acute distress HEENT: Normal NECK: No JVD LYMPHATICS: No lymphadenopathy CARDIAC: RRR, no murmurs, rubs, gallops RESPIRATORY:  Clear to auscultation without rales, wheezing or rhonchi  ABDOMEN: Soft, non-tender, non-distended MUSCULOSKELETAL:  No edema; No deformity  SKIN: Warm and dry NEUROLOGIC:  Alert and oriented x 3 PSYCHIATRIC:  Normal affect   ASSESSMENT:    1. DOE (dyspnea on exertion)   2. Precordial pain   3. Pre-procedure lab exam    PLAN:    In order of problems listed above:  Chest pain:atypical.  Describes sharp substernal pain occurring at rest or with exertion.  Given risk factors and abnormal EKG with nonspecific ST depressions, warrants CAD evaluation - Coronary CTA  DOE: reports dyspnea with  minimal exertion.  Could represent anginal equivalent as above or be related to deconditioning. - Coronary CTA - TTE  Hypertension: On losartan 100 mg daily, Toprol-XL 50 mg daily. Appears well-controlled, continue current meds.  Hyperlipidemia: On rosuvastatin 20 mg daily.  Last LDL 55 on 06/10/2019.  Well-controlled, continue rosuvastatin.  Type 2 diabetes: On Invokana and metformin.  Last A1c 6.4 on 06/10/2019.  Well-controlled  RTC as needed  Medication Adjustments/Labs and Tests Ordered: Current medicines are reviewed at length with the patient today.  Concerns regarding medicines are outlined above.  Orders Placed This Encounter  Procedures  . CT CORONARY MORPH W/CTA COR W/SCORE W/CA W/CM &/OR WO/CM  . CT CORONARY FRACTIONAL FLOW RESERVE DATA PREP  . CT CORONARY FRACTIONAL FLOW RESERVE FLUID ANALYSIS  . Basic metabolic panel  . EKG 12-Lead  . ECHOCARDIOGRAM COMPLETE   Meds ordered this encounter  Medications  . metoprolol tartrate (LOPRESSOR) 50 MG tablet    Sig: TAKE 1 TABLET 2 HR PRIOR TO CARDIAC PROCEDURE    Dispense:  1 tablet    Refill:  0    Patient Instructions  Medication Instructions:  Your Physician recommend you continue on your current medication as directed.    If you need a refill on your cardiac medications before your next appointment, please call your pharmacy.   Lab work: Your physician recommends that you return for lab work in 1 week prior to procedure (BMP)  If you have labs (blood work) drawn today and your tests are completely normal, you will receive your results only by: Marland Kitchen MyChart Message (if you have MyChart) OR . A paper copy in the mail  If you have any lab test that is abnormal or we need to change your treatment, we will call you to review the results.  Testing/Procedures: Your physician has requested that you have an echocardiogram. Echocardiography is a painless test that uses sound waves to create images of your heart. It provides your  doctor with information about the size and shape of your heart and how well your heart's chambers and valves are working. This procedure takes approximately one hour. There are no restrictions for this procedure. [place] has requested that you have cardiac CT. Cardiac computed tomography (CT) is a painless test that uses an x-ray machine to take clear, detailed pictures of your heart. For further information please visit HugeFiesta.tn. Please follow instruction sheet as given. [facility]   Follow-Up: Your physician recommends that you schedule a follow-up appointment based on test results.  Your cardiac CT will be scheduled at one of the below locations:   [facility] [address] [place], [place] [address] [phone]  Please arrive at the [facility] main entrance of [facility] 30-45 minutes prior to test start time. Proceed to the [facility] Radiology Department (first floor) to check-in and test prep.  Please follow these instructions carefully (unless otherwise directed): Marland Kitchen  On the Night Before the Test: . Be sure to Drink plenty of water. . Do not consume any caffeinated/decaffeinated beverages or chocolate 12 hours prior to your test. . Do not take any antihistamines 12 hours prior to your test. . If you take Metformin do not take 24 hours prior to test.   On the Day of the Test: . Drink plenty of water. Do not drink any water within one hour of the test. . Do not eat any food 4 hours prior to the test. . You may take your regular medications prior to the test.  . Take metoprolol (Lopressor) two hours prior to test. . HOLD Furosemide/Hydrochlorothiazide morning of the test. . FEMALES- please wear underwire-free bra if available        After the Test: . Drink plenty of water. . After receiving IV contrast, you may experience a mild flushed feeling. This is normal. . On occasion, you may  experience a mild rash up to 24 hours after the test. This is not dangerous. If this occurs, you can take Benadryl 25 mg and increase your fluid intake. . If you experience trouble breathing, this can be serious. If it is severe call 911 IMMEDIATELY. If it is mild, please call our office. . If you take any of these medications: Glipizide/Metformin, Avandament, Glucavance, please do not take 48 hours after completing test.    Please contact the cardiac imaging nurse navigator should you have any questions/concerns Marchia Bond, RN Navigator Cardiac Imaging [facility] and Vascular Services [phone] Office  [phone] Cell        Signed, Donato Heinz, MD  06/27/2019 9:53 AM    [place]

## 2019-06-27 ENCOUNTER — Ambulatory Visit (INDEPENDENT_AMBULATORY_CARE_PROVIDER_SITE_OTHER): Payer: Medicare Other | Admitting: Cardiology

## 2019-06-27 ENCOUNTER — Encounter: Payer: Self-pay | Admitting: Cardiology

## 2019-06-27 ENCOUNTER — Other Ambulatory Visit: Payer: Self-pay

## 2019-06-27 VITALS — BP 125/86 | HR 83 | Ht 63.0 in | Wt 220.4 lb

## 2019-06-27 DIAGNOSIS — R072 Precordial pain: Secondary | ICD-10-CM | POA: Diagnosis not present

## 2019-06-27 DIAGNOSIS — Z01812 Encounter for preprocedural laboratory examination: Secondary | ICD-10-CM

## 2019-06-27 DIAGNOSIS — E1169 Type 2 diabetes mellitus with other specified complication: Secondary | ICD-10-CM

## 2019-06-27 DIAGNOSIS — I10 Essential (primary) hypertension: Secondary | ICD-10-CM

## 2019-06-27 DIAGNOSIS — R0609 Other forms of dyspnea: Secondary | ICD-10-CM

## 2019-06-27 DIAGNOSIS — E785 Hyperlipidemia, unspecified: Secondary | ICD-10-CM

## 2019-06-27 MED ORDER — METOPROLOL TARTRATE 50 MG PO TABS: ORAL_TABLET | ORAL | 0 refills | Status: DC

## 2019-06-27 NOTE — Patient Instructions (Addendum)
Medication Instructions:  Your Physician recommend you continue on your current medication as directed.    If you need a refill on your cardiac medications before your next appointment, please call your pharmacy.   Lab work: Your physician recommends that you return for lab work in 1 week prior to procedure (BMP)  If you have labs (blood work) drawn today and your tests are completely normal, you will receive your results only by: Marland Kitchen MyChart Message (if you have MyChart) OR . A paper copy in the mail If you have any lab test that is abnormal or we need to change your treatment, we will call you to review the results.  Testing/Procedures: Your physician has requested that you have an echocardiogram. Echocardiography is a painless test that uses sound waves to create images of your heart. It provides your doctor with information about the size and shape of your heart and how well your heart's chambers and valves are working. This procedure takes approximately one hour. There are no restrictions for this procedure. [place] has requested that you have cardiac CT. Cardiac computed tomography (CT) is a painless test that uses an x-ray machine to take clear, detailed pictures of your heart. For further information please visit HugeFiesta.tn. Please follow instruction sheet as given. [facility]   Follow-Up: Your physician recommends that you schedule a follow-up appointment based on test results.  Your cardiac CT will be scheduled at one of the below locations:   [facility] [address] [place], [place] [address] [phone]  Please arrive at the [facility] main entrance of [facility] 30-45 minutes prior to test start time. Proceed to the [facility] Radiology Department (first floor) to check-in and test prep.  Please follow these instructions carefully (unless otherwise directed): Marland Kitchen  On the Night Before  the Test: . Be sure to Drink plenty of water. . Do not consume any caffeinated/decaffeinated beverages or chocolate 12 hours prior to your test. . Do not take any antihistamines 12 hours prior to your test. . If you take Metformin do not take 24 hours prior to test.   On the Day of the Test: . Drink plenty of water. Do not drink any water within one hour of the test. . Do not eat any food 4 hours prior to the test. . You may take your regular medications prior to the test.  . Take metoprolol (Lopressor) two hours prior to test. . HOLD Furosemide/Hydrochlorothiazide morning of the test. . FEMALES- please wear underwire-free bra if available        After the Test: . Drink plenty of water. . After receiving IV contrast, you may experience a mild flushed feeling. This is normal. . On occasion, you may experience a mild rash up to 24 hours after the test. This is not dangerous. If this occurs, you can take Benadryl 25 mg and increase your fluid intake. . If you experience trouble breathing, this can be serious. If it is severe call 911 IMMEDIATELY. If it is mild, please call our office. . If you take any of these medications: Glipizide/Metformin, Avandament, Glucavance, please do not take 48 hours after completing test.    Please contact the cardiac imaging nurse navigator should you have any questions/concerns Marchia Bond, RN Navigator Cardiac [facility] [phone] Office  [phone] Cell

## 2019-06-30 ENCOUNTER — Telehealth: Payer: Self-pay | Admitting: Neurology

## 2019-06-30 NOTE — Telephone Encounter (Signed)
I called this patient regarding rescheduling 9/2 appointment due to NP out of office. I offered patient a virtual visit via MyChart, which she consented to, and verbalized understanding of how the visit will work. Appt was rescheduled to 9/28.

## 2019-07-01 ENCOUNTER — Ambulatory Visit (HOSPITAL_COMMUNITY): Payer: Medicare Other | Attending: Cardiovascular Disease

## 2019-07-01 ENCOUNTER — Other Ambulatory Visit: Payer: Self-pay

## 2019-07-01 DIAGNOSIS — R0609 Other forms of dyspnea: Secondary | ICD-10-CM | POA: Diagnosis not present

## 2019-07-02 ENCOUNTER — Telehealth: Payer: Self-pay | Admitting: *Deleted

## 2019-07-02 ENCOUNTER — Ambulatory Visit (INDEPENDENT_AMBULATORY_CARE_PROVIDER_SITE_OTHER): Payer: Medicare Other | Admitting: *Deleted

## 2019-07-02 ENCOUNTER — Ambulatory Visit: Payer: Medicare Other | Admitting: Neurology

## 2019-07-02 DIAGNOSIS — J309 Allergic rhinitis, unspecified: Secondary | ICD-10-CM | POA: Diagnosis not present

## 2019-07-02 NOTE — Telephone Encounter (Signed)
The patient is set to restart her red vials and had needed new red vials made. When the vial list was sent it stated that red vials needed to be made. A whole new vial set from [facility] to Red was made instead. I'm not sure how this affects the patient on the billing end of things, is this something you can look into for me please? The patient should only be charged for red vials.

## 2019-07-02 NOTE — Progress Notes (Signed)
Immunotherapy   Patient Details  Name: Grace Barry MRN: [id number] Date of Birth: April 21, 1951  07/02/2019  [name] started injections for  GRASS-TREE-WEED, CAT-DOG-MOLD Red Vials Following schedule: A Frequency:1 time per week Epi-Pen: Yes Consent signed and patient instructions given. Patient restarted Red vials today per Dr. Ernst Bowler at .081mL. Patient waited 30 minutes and did not experience any issues.    Ashleigh Fernandez-Vernon 07/02/2019, 1:40 PM

## 2019-07-03 ENCOUNTER — Telehealth (HOSPITAL_COMMUNITY): Payer: Self-pay | Admitting: Emergency Medicine

## 2019-07-03 LAB — BASIC METABOLIC PANEL
BUN/Creatinine Ratio: 14 (ref 12–28)
BUN: 11 mg/dL (ref 8–27)
CO2: 21 mmol/L (ref 20–29)
Calcium: 9.8 mg/dL (ref 8.7–10.3)
Chloride: 102 mmol/L (ref 96–106)
Creatinine, Ser: 0.79 mg/dL (ref 0.57–1.00)
GFR calc Af Amer: 88 mL/min/{1.73_m2} (ref >59)
GFR calc non Af Amer: 77 mL/min/{1.73_m2} (ref >59)
Glucose: 133 mg/dL — ABNORMAL HIGH (ref 65–99)
Potassium: 4.3 mmol/L (ref 3.5–5.2)
Sodium: 140 mmol/L (ref 134–144)

## 2019-07-03 NOTE — Telephone Encounter (Signed)
Thank You I appreciate it!

## 2019-07-03 NOTE — Telephone Encounter (Signed)
Ashleigh, I had already been made aware of the error and am waiting for her insurance to pay and then I will refund them back. Thanks for letting me know.

## 2019-07-03 NOTE — Telephone Encounter (Signed)
Left message on voicemail with name and callback number Eleanore Junio RN Navigator Cardiac Imaging [facility] [phone] Office [phone] Cell  

## 2019-07-04 ENCOUNTER — Other Ambulatory Visit: Payer: Self-pay

## 2019-07-04 ENCOUNTER — Ambulatory Visit (HOSPITAL_COMMUNITY)
Admission: RE | Admit: 2019-07-04 | Discharge: 2019-07-04 | Disposition: A | Discharge status: Home / Self Care | Payer: Medicare Other | Source: Ambulatory Visit | Attending: Cardiology | Admitting: Cardiology

## 2019-07-04 ENCOUNTER — Encounter (HOSPITAL_COMMUNITY): Payer: Self-pay

## 2019-07-04 ENCOUNTER — Ambulatory Visit (HOSPITAL_COMMUNITY): Payer: Medicare Other

## 2019-07-04 DIAGNOSIS — R072 Precordial pain: Secondary | ICD-10-CM | POA: Diagnosis not present

## 2019-07-04 MED ORDER — IOHEXOL 350 MG/ML SOLN
100.0000 mL | Freq: Once | INTRAVENOUS | Status: AC | PRN
  Administered 2019-07-04: 100 mL via INTRAVENOUS

## 2019-07-04 MED ORDER — METOPROLOL TARTRATE 5 MG/5ML IV SOLN
INTRAVENOUS | Status: AC
  Administered 2019-07-04: 11:00:00 5 mg via INTRAVENOUS
  Filled 2019-07-04: qty 10

## 2019-07-04 MED ORDER — METOPROLOL TARTRATE 5 MG/5ML IV SOLN
5.0000 mg | INTRAVENOUS | Status: DC | PRN
  Administered 2019-07-04: 11:00:00 5 mg via INTRAVENOUS
  Filled 2019-07-04: qty 5

## 2019-07-04 MED ORDER — NITROGLYCERIN 0.4 MG SL SUBL
SUBLINGUAL_TABLET | SUBLINGUAL | Status: AC
  Administered 2019-07-04: 11:00:00 0.8 mg via SUBLINGUAL
  Filled 2019-07-04: qty 2

## 2019-07-04 MED ORDER — NITROGLYCERIN 0.4 MG SL SUBL
0.8000 mg | SUBLINGUAL_TABLET | Freq: Once | SUBLINGUAL | Status: AC
  Administered 2019-07-04: 11:00:00 0.8 mg via SUBLINGUAL
  Filled 2019-07-04: qty 25

## 2019-07-09 ENCOUNTER — Ambulatory Visit (INDEPENDENT_AMBULATORY_CARE_PROVIDER_SITE_OTHER): Payer: Medicare Other | Admitting: *Deleted

## 2019-07-09 ENCOUNTER — Other Ambulatory Visit: Payer: Self-pay | Admitting: Cardiology

## 2019-07-09 DIAGNOSIS — J309 Allergic rhinitis, unspecified: Secondary | ICD-10-CM | POA: Diagnosis not present

## 2019-07-09 DIAGNOSIS — R079 Chest pain, unspecified: Secondary | ICD-10-CM

## 2019-07-14 ENCOUNTER — Ambulatory Visit (INDEPENDENT_AMBULATORY_CARE_PROVIDER_SITE_OTHER): Payer: Medicare Other | Admitting: *Deleted

## 2019-07-14 DIAGNOSIS — J309 Allergic rhinitis, unspecified: Secondary | ICD-10-CM

## 2019-07-24 ENCOUNTER — Telehealth (HOSPITAL_COMMUNITY): Payer: Self-pay

## 2019-07-24 NOTE — Telephone Encounter (Signed)
Encounter complete. 

## 2019-07-25 ENCOUNTER — Other Ambulatory Visit: Payer: Self-pay | Admitting: Physician Assistant

## 2019-07-25 ENCOUNTER — Other Ambulatory Visit: Payer: Self-pay | Admitting: Internal Medicine

## 2019-07-27 NOTE — Progress Notes (Signed)
Virtual Visit via Video Note  I connected with Grace Barry on 07/28/19 at  1:45 PM EDT by a video enabled telemedicine application and verified that I am speaking with the correct person using two identifiers.  Location: Patient: At her home  Provider: In the office    I discussed the limitations of evaluation and management by telemedicine and the availability of in person appointments. The patient expressed understanding and agreed to proceed.  History of Present Illness: 07/28/2019 SS: Grace Barry is a 68 year old female with history of migraine headache and reported tremor.  She remains on Topamax 50 mg daily, and metoprolol XR 50 mg daily.  She indicates this provides excellent control of her migraine headaches.  She says she is having less than 1 headache a month.  If she gets a headache, it may be moderate intensity, but taking her baclofen relieves the headache.  She indicates that her memory decline seems to have improved.  After last visit, she tried to decrease her dose of Topamax due to report of memory decline, but her headaches increased. She is currently undergoing evaluation by cardiology for EKG changes.  She is planning to have a cardiac stress test tomorrow.  She indicates she has been suffering from significant fatigue.  She does report tremor in her hands, left is worse than the right.  This does not impact her daily activity or handwriting.  She notices it worse during times of anxiety or stress.  She says she has had a few stumbles, but no falls.  She presents today for follow-up via virtual visit.  12/30/2018 SS: Grace Barry is a 68 year old female who presents for follow-up for migraine headache.  She is currently taking Topamax 50 mg daily and metoprolol 50 mg XR.  She reports she may have 2 headaches a month.  She reports that her headaches are not as bad as they used to be.  When she gets a headache she will take a baclofen tablet and go to bed.  Today she is requesting to  decrease her Topamax dose because she continues to have a foggy memory.  She finds that she will repeat herself, forgets where she puts things, forgets what people tell her.  She is worried about her memory because her mother had dementia.  She denies any new problems or concerns.  She presents today for follow-up unaccompanied.  HISTORY 03/13/2018 Dr. Jannifer Franklin GraceChanceis a 68 year old right-handed white female with a history of obesity and sleep apnea who has a history of migraine headache. The patient has had reports of memory problems on a higher dose of the Topamax, the Topamax was reduced to 50 mg daily and the metoprolol was increased. The headaches have leveled off to having about 1 month, the headaches are easy to control, the patient will take baclofen if needed. The patient has chronic issues with feeling tired and drowsy during the day, she uses a sleep appliance at night. She is followed through a sleep physician. The patient has had a dramatic improvement in her memory and with herability to concentrate. She is quite pleased with her current therapy.   Observations/Objective: Via virtual visit, she is alert and oriented, answers questions appropriately, speech is clear and concise, well-appearing, facial symmetry noted, no arm drift, mild tremor noted to left hand when outstretched, gait is intact, reported blood pressure 129/70, HR 69  Assessment and Plan: 1.  Migraine headache 2.  Tremor  Her headaches are currently under excellent control.  She indicates less than 1 headache a month.  She will continue taking Topamax 50 mg daily, and metoprolol XR 50 mg daily.  She indicates her memory concern, has improved.  She has tried to decrease her dose of Topamax due to cognitive dysfunction, but her headaches increase.  She will continue taking baclofen as needed for acute headache.  She has a mild left intention tremor noted on exam. She has had MRI of the brain in 2019 that was normal.  The tremor is intermittent but seems to worsen during times of stress or anxiety.  She is pending a cardiac stress test tomorrow.  She will follow-up in this office in 1 year or sooner if needed.   Follow Up Instructions: 1 year July 28, 2020 at 1115   I discussed the assessment and treatment plan with the patient. The patient was provided an opportunity to ask questions and all were answered. The patient agreed with the plan and demonstrated an understanding of the instructions.   The patient was advised to call back or seek an in-person evaluation if the symptoms worsen or if the condition fails to improve as anticipated.  I provided 15 minutes of non-face-to-face time during this encounter.   Evangeline Dakin, DNP  [facility] [address], [place], [place] [address] [phone]

## 2019-07-28 ENCOUNTER — Encounter: Payer: Self-pay | Admitting: Neurology

## 2019-07-28 ENCOUNTER — Telehealth (INDEPENDENT_AMBULATORY_CARE_PROVIDER_SITE_OTHER): Payer: Medicare Other | Admitting: Neurology

## 2019-07-28 DIAGNOSIS — G43009 Migraine without aura, not intractable, without status migrainosus: Secondary | ICD-10-CM

## 2019-07-29 ENCOUNTER — Ambulatory Visit (HOSPITAL_COMMUNITY)
Admission: RE | Admit: 2019-07-29 | Discharge: 2019-07-29 | Disposition: A | Discharge status: Home / Self Care | Payer: Medicare Other | Source: Ambulatory Visit | Attending: Cardiology | Admitting: Cardiology

## 2019-07-29 ENCOUNTER — Other Ambulatory Visit: Payer: Self-pay

## 2019-07-29 DIAGNOSIS — R079 Chest pain, unspecified: Secondary | ICD-10-CM | POA: Diagnosis not present

## 2019-07-29 LAB — MYOCARDIAL PERFUSION IMAGING
LV dias vol: 48 mL (ref 46–106)
LV sys vol: 14 mL
Peak HR: 83 {beats}/min
Rest HR: 66 {beats}/min
SDS: 1
SRS: 4
SSS: 5
TID: 1.07

## 2019-07-29 MED ORDER — TECHNETIUM TC 99M TETROFOSMIN IV KIT
10.8000 | PACK | Freq: Once | INTRAVENOUS | Status: AC | PRN
  Administered 2019-07-29: 10.8 via INTRAVENOUS
  Filled 2019-07-29: qty 11

## 2019-07-29 MED ORDER — TECHNETIUM TC 99M TETROFOSMIN IV KIT
32.0000 | PACK | Freq: Once | INTRAVENOUS | Status: AC | PRN
  Administered 2019-07-29: 32 via INTRAVENOUS
  Filled 2019-07-29: qty 32

## 2019-07-29 MED ORDER — AMINOPHYLLINE 25 MG/ML IV SOLN
75.0000 mg | Freq: Once | INTRAVENOUS | Status: AC
  Administered 2019-07-29: 75 mg via INTRAVENOUS

## 2019-07-29 MED ORDER — REGADENOSON 0.4 MG/5ML IV SOLN
0.4000 mg | Freq: Once | INTRAVENOUS | Status: AC
  Administered 2019-07-29: 0.4 mg via INTRAVENOUS

## 2019-07-29 NOTE — Progress Notes (Signed)
I have read the note, and I agree with the clinical assessment and plan.  Xandria Gallaga K Lorane Cousar   

## 2019-08-04 ENCOUNTER — Ambulatory Visit (INDEPENDENT_AMBULATORY_CARE_PROVIDER_SITE_OTHER): Payer: Medicare Other | Admitting: *Deleted

## 2019-08-04 DIAGNOSIS — J309 Allergic rhinitis, unspecified: Secondary | ICD-10-CM

## 2019-08-14 ENCOUNTER — Ambulatory Visit (INDEPENDENT_AMBULATORY_CARE_PROVIDER_SITE_OTHER): Payer: Medicare Other

## 2019-08-14 DIAGNOSIS — J309 Allergic rhinitis, unspecified: Secondary | ICD-10-CM

## 2019-08-20 ENCOUNTER — Encounter: Payer: Self-pay | Admitting: Cardiology

## 2019-08-20 ENCOUNTER — Other Ambulatory Visit: Payer: Self-pay

## 2019-08-20 ENCOUNTER — Other Ambulatory Visit: Payer: Self-pay | Admitting: Physician Assistant

## 2019-08-20 ENCOUNTER — Ambulatory Visit (INDEPENDENT_AMBULATORY_CARE_PROVIDER_SITE_OTHER): Payer: Medicare Other | Admitting: Cardiology

## 2019-08-20 VITALS — BP 128/70 | HR 89 | Temp 96.6°F | Ht 63.0 in | Wt 221.0 lb

## 2019-08-20 DIAGNOSIS — R0600 Dyspnea, unspecified: Secondary | ICD-10-CM

## 2019-08-20 DIAGNOSIS — R0609 Other forms of dyspnea: Secondary | ICD-10-CM

## 2019-08-20 DIAGNOSIS — Q245 Malformation of coronary vessels: Secondary | ICD-10-CM

## 2019-08-20 DIAGNOSIS — R079 Chest pain, unspecified: Secondary | ICD-10-CM | POA: Diagnosis not present

## 2019-08-20 DIAGNOSIS — E1169 Type 2 diabetes mellitus with other specified complication: Secondary | ICD-10-CM | POA: Diagnosis not present

## 2019-08-20 DIAGNOSIS — E785 Hyperlipidemia, unspecified: Secondary | ICD-10-CM

## 2019-08-20 DIAGNOSIS — I10 Essential (primary) hypertension: Secondary | ICD-10-CM | POA: Diagnosis not present

## 2019-08-20 MED ORDER — PHENTERMINE HCL 37.5 MG PO TABS: 37.5000 mg | ORAL_TABLET | Freq: Every day | ORAL | 2 refills | Status: DC

## 2019-08-20 NOTE — Addendum Note (Signed)
Addended by: Vicie Mutters R on: 08/20/2019 02:20 PM   Modules accepted: Orders

## 2019-08-20 NOTE — Patient Instructions (Signed)
Follow-Up: IN 12 months Please call our office 2 months in advance, AUG 2021 to schedule this OCT 2021 appointment. In Person You may see DR Oswaldo Milian, MD or one of the following Advanced Practice Providers on your designated Care Team:  Rosaria Ferries, PA-C Jory Sims, DNP, ANP Cadence Kathlen Mody, NP.    Medication Instructions:  The current medical regimen is effective;  continue present plan and medications as directed. Please refer to the Current Medication list given to you today. If you need a refill on your cardiac medications before your next appointment, please call your pharmacy.  At [facility], you and your health needs are our priority.  As part of our continuing mission to provide you with exceptional heart care, we have created designated Provider Care Teams.  These Care Teams include your primary Cardiologist (physician) and Advanced Practice Providers (APPs -  Physician Assistants and Nurse Practitioners) who all work together to provide you with the care you need, when you need it.  Thank you for choosing [facility] at [facility]!!

## 2019-08-20 NOTE — Progress Notes (Signed)
Future Appointments  Date Time Provider [place]  09/10/2019  3:30 PM Unk Pinto, MD [facility] None  12/15/2019  2:00 PM Liane Comber, NP [facility] None  06/14/2020  2:00 PM Vicie Mutters, PA-C [facility] None  07/28/2020 11:15 AM Suzzanne Cloud, NP [facility] None

## 2019-08-20 NOTE — Progress Notes (Signed)
Cardiology Office Note:    Date:  08/20/2019   ID:  Grace Barry, DOB [birth year], MRN [id number]  PCP:  Unk Pinto, MD  Cardiologist:  No primary care provider on file.  Electrophysiologist:  None   Referring MD: Unk Pinto, MD   Chief complaint: chest pain  History of Present Illness:    Grace Barry is a 68 y.o. female with a hx of hypertension, hyperlipidemia, OSA, type 2 diabetes, hypothyroidism who was referred by Vicie Mutters, PA-C for evaluation of EKG abnormalities and chest pain.  Patient was seen by PCP with plans to start on phentermine for weight loss and an exercise program.  EKG was checked and showed new T wave inversions.  She was referred to cardiology for further evaluation.  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  TTE on 9 1/20 showed no abnormalities.  She underwent a SPECT on 07/29/2019 to evaluate for ischemia given her fistula, and it showed no evidence of ischemia.  She states that she feels that her chest pain is indigestion, improving with famotidine.  Main issues have been fatigue and dyspnea.  She feels her activity is limited by dyspnea.  Has not been getting any regular exercise.   Past Medical History:  Diagnosis Date  . Allergy   . Anxiety   . Arthritis   . Asthma   . Chronic kidney disease (CKD), stage II (mild) 07/01/2014   Overview:  Overview:  Due to DM, last GFR 64   . Depression   . Fibromyalgia   . GERD (gastroesophageal reflux disease)   . Hyperlipidemia   . Hypertension   . Liver disease   . Migraine 02/19/2015  . Migraines   . Obesity   . Sleep apnea   . Sleep difficulties   . Thyroid disease    hypothyroidism  . Type II or unspecified type diabetes mellitus without mention of complication, not stated as uncontrolled   . Urticaria   . Ventral hernia   . Vertigo 02/19/2015    Past Surgical History:  Procedure Laterality  Date  . ABDOMINAL HYSTERECTOMY    . ADENOIDECTOMY    . CARPAL TUNNEL RELEASE     bilateral  . CESAREAN SECTION     x2  . CHOLECYSTECTOMY    . HERNIA REPAIR  07/24/11   ventral hernia  . JOINT REPLACEMENT    . KNEE ARTHROSCOPY     bilateral  . SINOSCOPY    . TONSILLECTOMY    . TOTAL KNEE ARTHROPLASTY     bilateral    Current Medications: Current Meds  Medication Sig  . aspirin 81 MG tablet Take 81 mg by mouth daily.  . baclofen (LIORESAL) 10 MG tablet Take 1 tablet (10 mg total) by mouth 2 (two) times daily as needed for muscle spasms.  Marland Kitchen CALCIUM PO Take 1,200 mg by mouth daily.  . cetirizine (ZYRTEC) 10 MG tablet Take 10 mg by mouth as needed. Reported on 10/20/2015  . EPINEPHrine (EPIPEN 2-PAK) 0.3 mg/0.3 mL IJ SOAJ injection Inject 0.3 mg into the muscle once.  . famotidine (PEPCID) 40 MG tablet TAKE 1 TABLET BY MOUTH TWICE A DAY. ( REPLACING ZANTAC/RANITIDINE)  . fluconazole (DIFLUCAN) 150 MG tablet Take 1 tablet (150 mg total) by mouth daily. (Patient taking differently: Take 150 mg by mouth as needed. )  . glucose blood (ONE TOUCH ULTRA TEST) test strip Check  Blood sugar  daily  . INVOKANA 300 MG TABS tablet TAKE 1 TABLET (300 MG TOTAL) BY MOUTH DAILY BEFORE BREAKFAST.  Marland Kitchen Lancets (ONETOUCH ULTRASOFT) lancets Check Blood sugar  daily  . losartan (COZAAR) 100 MG tablet TAKE 1 TABLET DAILY FOR BP AND KIDNEY PROTECTION  . Magnesium 250 MG TABS Take 250 mg by mouth 2 (two) times daily.  . metFORMIN (GLUCOPHAGE-XR) 500 MG 24 hr tablet TAKE 2 TABLETS BY MOUTH TWICE DAILY WITH FOOD FOR DIABETES  . metoprolol succinate (TOPROL-XL) 50 MG 24 hr tablet TAKE 1 TABLET (50 MG TOTAL) BY MOUTH DAILY. TAKE WITH OR IMMEDIATELY FOLLOWING A MEAL.  . Multiple Vitamins-Minerals (MULTIVITAMIN PO) Take 1 tablet by mouth daily.   . Omega-3 Fatty Acids (FISH OIL PO) Take by mouth daily.  Marland Kitchen PARoxetine (PAXIL) 40 MG tablet Take 1 tablet (40 mg total) by mouth daily.  Marland Kitchen PRESCRIPTION MEDICATION Inject as  directed every 30 (thirty) days. Allergy Shot  . rosuvastatin (CRESTOR) 20 MG tablet TAKE 1 TABLET BY MOUTH EVERYDAY AT BEDTIME  . SYNTHROID 200 MCG tablet Take 1 tablet daily on an empty stomach with only water for 30 minutes & no Antacid meds, Calcium or Magnesium for 4 hours & avoid Biotin  . topiramate (TOPAMAX) 50 MG tablet TAKE 1 TABLET BY MOUTH EVERYDAY AT BEDTIME  . Turmeric (QC TUMERIC COMPLEX PO) Take by mouth.  . Vitamin D, Ergocalciferol, (DRISDOL) 1.25 MG (50000 UT) CAPS capsule TAKE 1 CAPSULE 4 X /WEEK FOR VIT D DEFICIENCY     Allergies:   Floxin [ofloxacin], Viberzi [eluxadoline], and Doxycycline   Social History   Socioeconomic History  . Marital status: Married    Spouse name: Francee Piccolo  . Number of children: 2  . Years of education: masters  . Highest education level: Not on file  Occupational History  . Occupation: Retired Tour manager  . Financial resource strain: Not on file  . Food insecurity    Worry: Not on file    Inability: Not on file  . Transportation needs    Medical: Not on file    Non-medical: Not on file  Tobacco Use  . Smoking status: Never Smoker  . Smokeless tobacco: Never Used  Substance and Sexual Activity  . Alcohol use: Yes    Alcohol/week: 0.0 standard drinks    Comment: occasional  . Drug use: No  . Sexual activity: Not on file  Lifestyle  . Physical activity    Days per week: Not on file    Minutes per session: Not on file  . Stress: Not on file  Relationships  . Social Herbalist on phone: Not on file    Gets together: Not on file    Attends religious service: Not on file    Active member of club or organization: Not on file    Attends meetings of clubs or organizations: Not on file    Relationship status: Not on file  Other Topics Concern  . Not on file  Social History Narrative   Lives with husband, Francee Piccolo   Patient is right handed.   Patient drinks 64 oz of diet soda daily.     Family History: The  patient'sfamily history includes Breast cancer in her mother; Colon cancer in her son; Esophageal cancer (age of onset: 105) in her son; Hyperlipidemia in her father and sister; Hypertension in her father; Prostate cancer in her father; Stomach cancer in her son. There is no history of  Colon polyps, Diabetes, Kidney disease, or Migraines.  ROS:   Please see the history of present illness.    All other systems reviewed and are negative.  EKGs/Labs/Other Studies Reviewed:    The following studies were reviewed today:  SPECT 07/29/19:  The left ventricular ejection fraction is hyperdynamic (>65%).  Nuclear stress EF: 70%.  There was no ST segment deviation noted during stress.  No T wave inversion was noted during stress.  The study is normal.  This is a low risk study.   Coronary CTA 07/04/19: 1. Coronary calcium score of 1. This was 85 percentile for age and sex matched control.  2. Normal coronary origin with right dominance.  3. There appears to be a coronary fistula from the LAD to the main pulmonary artery  4. Nonobstructive CAD, with calcified and noncalcified plaques causing minimal (0-24%) stenosis  CAD-RADS 1. Minimal non-obstructive CAD (0-24%). Consider non-atherosclerotic causes of chest pain. Consider preventive therapy and risk factor modification.   TTE 07/01/19:  1. The left ventricle has normal systolic function with an ejection fraction of 60-65%. The cavity size was normal. Left ventricular diastolic parameters were normal.  2. The right ventricle has normal systolic function. The cavity was normal. There is no increase in right ventricular wall thickness.  3. No evidence of mitral valve stenosis.  4. No stenosis of the aortic valve.  5. The aorta is normal unless otherwise noted.  6. The aortic root and ascending aorta are normal in size and structure.  7. The atrial septum is grossly normal.  EKG:  EKG is ordered today.  The ekg ordered today  demonstrates normal sinus rhythm, <1 mm ST depressions/TWI in V3-6, I,II  Recent Labs: 06/10/2019: ALT 31; Hemoglobin 14.7; Magnesium 1.8; Platelets 277; TSH 1.46 07/02/2019: BUN 11; Creatinine, Ser 0.79; Potassium 4.3; Sodium 140  Recent Lipid Panel    Component Value Date/Time   CHOL 123 06/10/2019 1504   TRIG 189 (H) 06/10/2019 1504   HDL 42 (L) 06/10/2019 1504   CHOLHDL 2.9 06/10/2019 1504   VLDL 35 (H) 03/28/2017 1236   LDLCALC 55 06/10/2019 1504    Physical Exam:    VS:  BP 128/70 (BP Location: Right Arm, Patient Position: Sitting, Cuff Size: Large)   Pulse 89   Temp (!) 96.6 F (35.9 C)   Ht 5\' 3"  (1.6 m)   Wt 221 lb (100.2 kg)   BMI 39.15 kg/m     Wt Readings from Last 3 Encounters:  08/20/19 221 lb (100.2 kg)  07/29/19 220 lb (99.8 kg)  06/27/19 220 lb 6.4 oz (100 kg)     GEN: Well nourished, well developed in no acute distress HEENT: Normal NECK: No JVD LYMPHATICS: No lymphadenopathy CARDIAC: RRR, no murmurs, rubs, gallops RESPIRATORY:  Clear to auscultation without rales, wheezing or rhonchi  ABDOMEN: Soft, non-tender, non-distended MUSCULOSKELETAL:  No edema; No deformity  SKIN: Warm and dry NEUROLOGIC:  Alert and oriented x 3 PSYCHIATRIC:  Normal affect   ASSESSMENT:    1. Essential hypertension   2. Chest pain, unspecified type   3. DOE (dyspnea on exertion)   4. 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  Likely an incidental finding and not causing any pathology.  No evidence of ischemia on SPECT.  Normal chamber sizes on TTE.  Chest pain: Coronary CTA showed nonobstructive CAD.  Suspect secondary to GERD, improving with famotidine  DOE: reports dyspnea with minimal exertion.  TTE shows normal systolic function, normal diastolic  function, no significant valvular abnormalities.  Suspect related to deconditioning  Hypertension: On losartan 100 mg daily, Toprol-XL 50 mg daily. Appears well-controlled, continue current meds.  Hyperlipidemia: On rosuvastatin 20 mg daily.  Last LDL 55 on 06/10/2019.  Well-controlled, continue rosuvastatin.  Type 2 diabetes: On Invokana and metformin.  Last A1c 6.4 on 06/10/2019.  Well-controlled  RTC in 1 year  Medication Adjustments/Labs and Tests Ordered: Current medicines are reviewed at length with the patient today.  Concerns regarding medicines are outlined above.  Orders Placed This Encounter  Procedures  . EKG 12-Lead   No orders of the defined types were placed in this encounter.   Patient Instructions  Follow-Up: IN 12 months Please call our office 2 months in advance, AUG 2021 to schedule this OCT 2021 appointment. In Person You may see DR Oswaldo Milian, MD or one of the following Advanced Practice Providers on your designated Care Team:  Rosaria Ferries, PA-C Jory Sims, DNP, ANP Cadence Kathlen Mody, NP.    Medication Instructions:  The current medical regimen is effective;  continue present plan and medications as directed. Please refer to the Current Medication list given to you today. If you need a refill on your cardiac medications before your next appointment, please call your pharmacy.  At [facility], you and your health needs are our priority.  As part of our continuing mission to provide you with exceptional heart care, we have created designated Provider Care Teams.  These Care Teams include your primary Cardiologist (physician) and Advanced Practice Providers (APPs -  Physician Assistants and Nurse Practitioners) who all work together to provide you with the care you need, when you need it.  Thank you for choosing [facility] at [facility]!!          Signed, Donato Heinz, MD  08/20/2019 1:47 PM    [facility]

## 2019-08-21 ENCOUNTER — Ambulatory Visit (INDEPENDENT_AMBULATORY_CARE_PROVIDER_SITE_OTHER): Payer: Medicare Other | Admitting: *Deleted

## 2019-08-21 DIAGNOSIS — J309 Allergic rhinitis, unspecified: Secondary | ICD-10-CM

## 2019-09-02 ENCOUNTER — Ambulatory Visit (INDEPENDENT_AMBULATORY_CARE_PROVIDER_SITE_OTHER): Payer: Medicare Other

## 2019-09-02 DIAGNOSIS — J302 Other seasonal allergic rhinitis: Secondary | ICD-10-CM | POA: Diagnosis not present

## 2019-09-02 DIAGNOSIS — J3089 Other allergic rhinitis: Secondary | ICD-10-CM

## 2019-09-10 ENCOUNTER — Other Ambulatory Visit: Payer: Self-pay

## 2019-09-10 ENCOUNTER — Ambulatory Visit (INDEPENDENT_AMBULATORY_CARE_PROVIDER_SITE_OTHER): Payer: Medicare Other | Admitting: *Deleted

## 2019-09-10 ENCOUNTER — Ambulatory Visit: Payer: Medicare Other | Admitting: Internal Medicine

## 2019-09-10 ENCOUNTER — Encounter: Payer: Self-pay | Admitting: Internal Medicine

## 2019-09-10 VITALS — BP 124/80 | HR 76 | Temp 97.1°F | Resp 16 | Ht 63.0 in | Wt 220.8 lb

## 2019-09-10 DIAGNOSIS — I10 Essential (primary) hypertension: Secondary | ICD-10-CM | POA: Diagnosis not present

## 2019-09-10 DIAGNOSIS — N1831 Chronic kidney disease, stage 3a: Secondary | ICD-10-CM

## 2019-09-10 DIAGNOSIS — E669 Obesity, unspecified: Secondary | ICD-10-CM

## 2019-09-10 DIAGNOSIS — E1169 Type 2 diabetes mellitus with other specified complication: Secondary | ICD-10-CM

## 2019-09-10 DIAGNOSIS — E1122 Type 2 diabetes mellitus with diabetic chronic kidney disease: Secondary | ICD-10-CM

## 2019-09-10 DIAGNOSIS — E1121 Type 2 diabetes mellitus with diabetic nephropathy: Secondary | ICD-10-CM | POA: Diagnosis not present

## 2019-09-10 DIAGNOSIS — J309 Allergic rhinitis, unspecified: Secondary | ICD-10-CM

## 2019-09-10 DIAGNOSIS — E785 Hyperlipidemia, unspecified: Secondary | ICD-10-CM

## 2019-09-10 DIAGNOSIS — Z79899 Other long term (current) drug therapy: Secondary | ICD-10-CM

## 2019-09-10 DIAGNOSIS — E559 Vitamin D deficiency, unspecified: Secondary | ICD-10-CM

## 2019-09-10 MED ORDER — TOPIRAMATE 50 MG PO TABS: ORAL_TABLET | ORAL | 1 refills | Status: DC

## 2019-09-10 MED ORDER — PHENTERMINE HCL 37.5 MG PO TABS: ORAL_TABLET | ORAL | 1 refills | Status: DC

## 2019-09-10 NOTE — Patient Instructions (Signed)

## 2019-09-10 NOTE — Progress Notes (Signed)
History of Present Illness:      This very nice 67 y.o.female presents for 3 month follow up with HTN, HLD, T2_NIDDM and Vitamin D Deficiency. Patienthas OSA currently using an oral apliance from Dr Augustina Mood.       Patient is treated for HTN (2009) & BP has been controlled at home. Today's BP is at goal -  124/80. Patient has had no complaints of any cardiac type chest pain, palpitations, dyspnea / orthopnea / PND, dizziness, claudication, or dependent edema.      Hyperlipidemia is controlled with diet & meds. Patient denies myalgias or other med SE's. Last Lipids were at goal albeit elevated trig's:  Lab Results  Component Value Date   CHOL 123 06/10/2019   HDL 42 (L) 06/10/2019   LDLCALC 55 06/10/2019   TRIG 189 (H) 06/10/2019   CHOLHDL 2.9 06/10/2019       Patient was dx'd Hypothyroid in 2008 and has been on Thyroid Replacement since.    Also, the patient has Morbid Obesity (BMI 36+)   history of T2_NIDDM (2006) w/CKD3  and has had no symptoms of reactive hypoglycemia, diabetic polys, paresthesias or visual blurring.  Last A1c was not at goal:  Lab Results  Component Value Date   HGBA1C 6.4 (H) 06/10/2019       Further, the patient also has history of Vitamin D Deficiency and supplements vitamin D without any suspected side-effects. Last vitamin D was at goal:  Lab Results  Component Value Date   VD25OH 86 06/10/2019    Current Outpatient Medications on File Prior to Visit  Medication Sig  . aspirin 81 MG tablet Take 81 mg by mouth daily.  . baclofen (LIORESAL) 10 MG tablet Take 1 tablet (10 mg total) by mouth 2 (two) times daily as needed for muscle spasms.  Marland Kitchen CALCIUM PO Take 1,200 mg by mouth daily.  . cetirizine (ZYRTEC) 10 MG tablet Take 10 mg by mouth as needed. Reported on 10/20/2015  . Cyanocobalamin (VITAMIN B12 SL) Place 1 tablet under the tongue daily.  Marland Kitchen EPINEPHrine (EPIPEN 2-PAK) 0.3 mg/0.3 mL IJ SOAJ injection Inject 0.3 mg into the muscle once.  .  famotidine (PEPCID) 40 MG tablet TAKE 1 TABLET BY MOUTH TWICE A DAY. ( REPLACING ZANTAC/RANITIDINE)  . fluconazole (DIFLUCAN) 150 MG tablet Take 1 tablet (150 mg total) by mouth daily. (Patient taking differently: Take 150 mg by mouth as needed. )  . glucose blood (ONE TOUCH ULTRA TEST) test strip Check Blood sugar  daily  . INVOKANA 300 MG TABS tablet TAKE 1 TABLET (300 MG TOTAL) BY MOUTH DAILY BEFORE BREAKFAST.  Marland Kitchen Lancets (ONETOUCH ULTRASOFT) lancets Check Blood sugar  daily  . losartan (COZAAR) 100 MG tablet TAKE 1 TABLET DAILY FOR BP AND KIDNEY PROTECTION  . Magnesium 250 MG TABS Take 250 mg by mouth 2 (two) times daily.  . metFORMIN (GLUCOPHAGE-XR) 500 MG 24 hr tablet TAKE 2 TABLETS BY MOUTH TWICE DAILY WITH FOOD FOR DIABETES  . metoprolol succinate (TOPROL-XL) 50 MG 24 hr tablet TAKE 1 TABLET (50 MG TOTAL) BY MOUTH DAILY. TAKE WITH OR IMMEDIATELY FOLLOWING A MEAL.  . Multiple Vitamins-Minerals (MULTIVITAMIN PO) Take 1 tablet by mouth daily.   . Omega-3 Fatty Acids (FISH OIL PO) Take by mouth daily.  Marland Kitchen PARoxetine (PAXIL) 40 MG tablet Take 1 tablet (40 mg total) by mouth daily.  . phentermine (ADIPEX-P) 37.5 MG tablet Take 1 tablet (37.5 mg total) by mouth daily  before breakfast.  . PRESCRIPTION MEDICATION Inject as directed every 30 (thirty) days. Allergy Shot  . rosuvastatin (CRESTOR) 20 MG tablet TAKE 1 TABLET BY MOUTH EVERYDAY AT BEDTIME  . SYNTHROID 200 MCG tablet Take 1 tablet daily on an empty stomach with only water for 30 minutes & no Antacid meds, Calcium or Magnesium for 4 hours & avoid Biotin  . topiramate (TOPAMAX) 50 MG tablet TAKE 1 TABLET BY MOUTH EVERYDAY AT BEDTIME  . Turmeric (QC TUMERIC COMPLEX PO) Take by mouth.  . Vitamin D, Ergocalciferol, (DRISDOL) 1.25 MG (50000 UT) CAPS capsule TAKE 1 CAPSULE 4 X /WEEK FOR VIT D DEFICIENCY   No current facility-administered medications on file prior to visit.     Allergies  Allergen Reactions  . Floxin [Ofloxacin] Hives  .  Viberzi [Eluxadoline] Hives  . Doxycycline Rash    PMHx:   Past Medical History:  Diagnosis Date  . Allergy   . Anxiety   . Arthritis   . Asthma   . Chronic kidney disease (CKD), stage II (mild) 07/01/2014   Overview:  Overview:  Due to DM, last GFR 64   . Depression   . Fibromyalgia   . GERD (gastroesophageal reflux disease)   . Hyperlipidemia   . Hypertension   . Liver disease   . Migraine 02/19/2015  . Migraines   . Obesity   . Sleep apnea   . Sleep difficulties   . Thyroid disease    hypothyroidism  . Type II or unspecified type diabetes mellitus without mention of complication, not stated as uncontrolled   . Urticaria   . Ventral hernia   . Vertigo 02/19/2015   Immunization History  Administered Date(s) Administered  . Influenza, High Dose Seasonal PF 08/10/2015, 08/10/2016, 08/28/2017, 11/07/2018, 08/07/2019  . Influenza-Unspecified 08/05/2019  . Pneumococcal Conjugate-13 07/01/2014  . Pneumococcal Polysaccharide-23 10/09/2002, 05/26/2008, 12/13/2016  . Td 10/09/2002, 10/30/2010  . Tdap 08/10/2015  . Zoster 06/20/2011   Past Surgical History:  Procedure Laterality Date  . ABDOMINAL HYSTERECTOMY    . ADENOIDECTOMY    . CARPAL TUNNEL RELEASE     bilateral  . CESAREAN SECTION     x2  . CHOLECYSTECTOMY    . HERNIA REPAIR  07/24/11   ventral hernia  . JOINT REPLACEMENT    . KNEE ARTHROSCOPY     bilateral  . SINOSCOPY    . TONSILLECTOMY    . TOTAL KNEE ARTHROPLASTY     bilateral    FHx:    Reviewed / unchanged  SHx:    Reviewed / unchanged   Systems Review:  Constitutional: Denies fever, chills, wt changes, headaches, insomnia, fatigue, night sweats, change in appetite. Eyes: Denies redness, blurred vision, diplopia, discharge, itchy, watery eyes.  ENT: Denies discharge, congestion, post nasal drip, epistaxis, sore throat, earache, hearing loss, dental pain, tinnitus, vertigo, sinus pain, snoring.  CV: Denies chest pain, palpitations, irregular  heartbeat, syncope, dyspnea, diaphoresis, orthopnea, PND, claudication or edema. Respiratory: denies cough, dyspnea, DOE, pleurisy, hoarseness, laryngitis, wheezing.  Gastrointestinal: Denies dysphagia, odynophagia, heartburn, reflux, water brash, abdominal pain or cramps, nausea, vomiting, bloating, diarrhea, constipation, hematemesis, melena, hematochezia  or hemorrhoids. Genitourinary: Denies dysuria, frequency, urgency, nocturia, hesitancy, discharge, hematuria or flank pain. Musculoskeletal: Denies arthralgias, myalgias, stiffness, jt. swelling, pain, limping or strain/sprain.  Skin: Denies pruritus, rash, hives, warts, acne, eczema or change in skin lesion(s). Neuro: No weakness, tremor, incoordination, spasms, paresthesia or pain. Psychiatric: Denies confusion, memory loss or sensory loss. Endo: Denies change in weight, skin  or hair change.  Heme/Lymph: No excessive bleeding, bruising or enlarged lymph nodes.  Physical Exam  BP 124/80   Pulse 76   Temp (!) 97.1 F (36.2 C)   Resp 16   Ht 5\' 3"  (1.6 m)   Wt 220 lb 12.8 oz (100.2 kg)   BMI 39.11 kg/m   Appears  well nourished, well groomed  and in no distress.  Eyes: PERRLA, EOMs, conjunctiva no swelling or erythema. Sinuses: No frontal/maxillary tenderness ENT/Mouth: EAC's clear, TM's nl w/o erythema, bulging. Nares clear w/o erythema, swelling, exudates. Oropharynx clear without erythema or exudates. Oral hygiene is good. Tongue normal, non obstructing. Hearing intact.  Neck: Supple. Thyroid not palpable. Car 2+/2+ without bruits, nodes or JVD. Chest: Respirations nl with BS clear & equal w/o rales, rhonchi, wheezing or stridor.  Cor: Heart sounds normal w/ regular rate and rhythm without sig. murmurs, gallops, clicks or rubs. Peripheral pulses normal and equal  without edema.  Abdomen: Soft & bowel sounds normal. Non-tender w/o guarding, rebound, hernias, masses or organomegaly.  Lymphatics: Unremarkable.  Musculoskeletal:  Full ROM all peripheral extremities, joint stability, 5/5 strength and normal gait.  Skin: Warm, dry without exposed rashes, lesions or ecchymosis apparent.  Neuro: Cranial nerves intact, reflexes equal bilaterally. Sensory-motor testing grossly intact. Tendon reflexes grossly intact.  Pysch: Alert & oriented x 3.  Insight and judgement nl & appropriate. No ideations.  Assessment and Plan:  1. Essential hypertension  - Continue medication, monitor blood pressure at home.  - Continue DASH diet.  Reminder to go to the ER if any CP,  SOB, nausea, dizziness, severe HA, changes vision/speech.  - CBC with Diff - COMPLETE METABOLIC PANEL WITH GFR - Magnesium - TSH  2.  Hyperlipidemia associated with type 2 diabetes mellitus ([place])  - Continue diet/meds, exercise,& lifestyle modifications.  - Continue monitor periodic cholesterol/liver & renal functions   - Lipid Profile - TSH  3. Type 2 diabetes mellitus with stage 3a chronic kidney disease,  without long-term current use of insulin ([facility])  - Continue diet, exercise  - Lifestyle modifications.  - Monitor appropriate labs.  - Hemoglobin A1c ([facility]) - Insulin, random  4. Stage 3a chronic kidney disease  - Continue supplementation.  - COMPLETE METABOLIC PANEL WITH GFR - Hemoglobin A1c ([facility]) - Insulin, random  5. Vitamin D deficiency disease  - Vitamin D (25 hydroxy)  6. Medication management  - CBC with Diff - COMPLETE METABOLIC PANEL WITH GFR - Magnesium - Lipid Profile - TSH - Hemoglobin A1c ([facility]) - Insulin, random - Vitamin D (25 hydroxy)  7. Obesity (BMI 35.0-39.9 without comorbidity)  - topiramate (TOPAMAX) 50 MG tablet; Take 1/2 to 1 tablet 2 x /day with Supper & Bedtime for Dieting & Weight Loss  Dispense: 180 tablet; Refill: 1 - phentermine (ADIPEX-P) 37.5 MG tablet; Take 1/2 to 1 tablet every Morning for Dieting & Weight Loss  Dispense: 90 tablet; Refill: 1         Discussed  regular exercise, BP  monitoring, weight control to achieve/maintain BMI less than 25 and discussed med and SE's. Recommended labs to assess and monitor clinical status with further disposition pending results of labs.  I discussed the assessment and treatment plan with the patient. The patient was provided an opportunity to ask questions and all were answered. The patient agreed with the plan and demonstrated an understanding of the instructions.  I provided over 30 minutes of exam, counseling, chart review and  complex critical decision making.  Kirtland Bouchard, MD

## 2019-09-11 LAB — LIPID PANEL
Cholesterol: 125 mg/dL (ref <200)
HDL: 40 mg/dL — ABNORMAL LOW (ref >=50)
LDL Cholesterol (Calc): 53 mg/dL (calc)
Non-HDL Cholesterol (Calc): 85 mg/dL (calc) (ref <130)
Total CHOL/HDL Ratio: 3.1 (calc) (ref <5.0)
Triglycerides: 304 mg/dL — ABNORMAL HIGH (ref <150)

## 2019-09-11 LAB — COMPLETE METABOLIC PANEL WITH GFR
AG Ratio: 2.1 (calc) (ref 1.0–2.5)
ALT: 42 U/L — ABNORMAL HIGH (ref 6–29)
AST: 30 U/L (ref 10–35)
Albumin: 4.7 g/dL (ref 3.6–5.1)
Alkaline phosphatase (APISO): 78 U/L (ref 37–153)
BUN/Creatinine Ratio: 16 (calc) (ref 6–22)
BUN: 16 mg/dL (ref 7–25)
CO2: 27 mmol/L (ref 20–32)
Calcium: 9.7 mg/dL (ref 8.6–10.4)
Chloride: 102 mmol/L (ref 98–110)
Creat: 1.03 mg/dL — ABNORMAL HIGH (ref 0.50–0.99)
GFR, Est African American: 64 mL/min/{1.73_m2} (ref >=60)
GFR, Est Non African American: 55 mL/min/{1.73_m2} — ABNORMAL LOW (ref >=60)
Globulin: 2.2 g/dL (calc) (ref 1.9–3.7)
Glucose, Bld: 108 mg/dL — ABNORMAL HIGH (ref 65–99)
Potassium: 4.3 mmol/L (ref 3.5–5.3)
Sodium: 139 mmol/L (ref 135–146)
Total Bilirubin: 0.6 mg/dL (ref 0.2–1.2)
Total Protein: 6.9 g/dL (ref 6.1–8.1)

## 2019-09-11 LAB — VITAMIN D 25 HYDROXY (VIT D DEFICIENCY, FRACTURES): Vit D, 25-Hydroxy: 87 ng/mL (ref 30–100)

## 2019-09-11 LAB — CBC WITH DIFFERENTIAL/PLATELET
Absolute Monocytes: 874 cells/uL (ref 200–950)
Basophils Absolute: 69 cells/uL (ref 0–200)
Basophils Relative: 0.6 %
Eosinophils Absolute: 276 cells/uL (ref 15–500)
Eosinophils Relative: 2.4 %
HCT: 46.2 % — ABNORMAL HIGH (ref 35.0–45.0)
Hemoglobin: 15 g/dL (ref 11.7–15.5)
Lymphs Abs: 4002 cells/uL — ABNORMAL HIGH (ref 850–3900)
MCH: 28.9 pg (ref 27.0–33.0)
MCHC: 32.5 g/dL (ref 32.0–36.0)
MCV: 89 fL (ref 80.0–100.0)
MPV: 11 fL (ref 7.5–12.5)
Monocytes Relative: 7.6 %
Neutro Abs: 6279 cells/uL (ref 1500–7800)
Neutrophils Relative %: 54.6 %
Platelets: 284 10*3/uL (ref 140–400)
RBC: 5.19 10*6/uL — ABNORMAL HIGH (ref 3.80–5.10)
RDW: 12.8 % (ref 11.0–15.0)
Total Lymphocyte: 34.8 %
WBC: 11.5 10*3/uL — ABNORMAL HIGH (ref 3.8–10.8)

## 2019-09-11 LAB — HEMOGLOBIN A1C
Hgb A1c MFr Bld: 6.4 % of total Hgb — ABNORMAL HIGH (ref <5.7)
Mean Plasma Glucose: 137 (calc)
eAG (mmol/L): 7.6 (calc)

## 2019-09-11 LAB — INSULIN, RANDOM: Insulin: 32.1 u[IU]/mL — ABNORMAL HIGH

## 2019-09-11 LAB — MAGNESIUM: Magnesium: 2.1 mg/dL (ref 1.5–2.5)

## 2019-09-11 LAB — TSH: TSH: 2.78 mIU/L (ref 0.40–4.50)

## 2019-09-14 ENCOUNTER — Encounter: Payer: Self-pay | Admitting: Internal Medicine

## 2019-09-19 ENCOUNTER — Ambulatory Visit (INDEPENDENT_AMBULATORY_CARE_PROVIDER_SITE_OTHER): Payer: Medicare Other

## 2019-09-19 DIAGNOSIS — J309 Allergic rhinitis, unspecified: Secondary | ICD-10-CM | POA: Diagnosis not present

## 2019-09-22 ENCOUNTER — Other Ambulatory Visit: Payer: Self-pay | Admitting: Cardiology

## 2019-09-22 MED ORDER — CANAGLIFLOZIN 300 MG PO TABS: 300.0000 mg | ORAL_TABLET | Freq: Every day | ORAL | 1 refills | Status: DC

## 2019-09-22 MED ORDER — FAMOTIDINE 40 MG PO TABS: ORAL_TABLET | ORAL | 1 refills | Status: DC

## 2019-09-30 ENCOUNTER — Ambulatory Visit (INDEPENDENT_AMBULATORY_CARE_PROVIDER_SITE_OTHER): Payer: Medicare Other | Admitting: *Deleted

## 2019-09-30 DIAGNOSIS — J309 Allergic rhinitis, unspecified: Secondary | ICD-10-CM

## 2019-10-07 ENCOUNTER — Ambulatory Visit (INDEPENDENT_AMBULATORY_CARE_PROVIDER_SITE_OTHER): Payer: Medicare Other

## 2019-10-07 DIAGNOSIS — J309 Allergic rhinitis, unspecified: Secondary | ICD-10-CM

## 2019-10-12 ENCOUNTER — Other Ambulatory Visit: Payer: Self-pay | Admitting: Cardiology

## 2019-10-13 DIAGNOSIS — J3089 Other allergic rhinitis: Secondary | ICD-10-CM | POA: Diagnosis not present

## 2019-10-13 NOTE — Progress Notes (Signed)
VIALS EXP 10-12-20 

## 2019-10-14 ENCOUNTER — Ambulatory Visit (INDEPENDENT_AMBULATORY_CARE_PROVIDER_SITE_OTHER): Payer: Medicare Other

## 2019-10-14 DIAGNOSIS — J309 Allergic rhinitis, unspecified: Secondary | ICD-10-CM | POA: Diagnosis not present

## 2019-10-15 DIAGNOSIS — J301 Allergic rhinitis due to pollen: Secondary | ICD-10-CM

## 2019-10-22 ENCOUNTER — Ambulatory Visit (INDEPENDENT_AMBULATORY_CARE_PROVIDER_SITE_OTHER): Payer: Medicare Other | Admitting: *Deleted

## 2019-10-22 DIAGNOSIS — J309 Allergic rhinitis, unspecified: Secondary | ICD-10-CM | POA: Diagnosis not present

## 2019-11-06 ENCOUNTER — Ambulatory Visit (INDEPENDENT_AMBULATORY_CARE_PROVIDER_SITE_OTHER): Payer: Medicare HMO

## 2019-11-06 DIAGNOSIS — J309 Allergic rhinitis, unspecified: Secondary | ICD-10-CM | POA: Diagnosis not present

## 2019-11-10 ENCOUNTER — Telehealth: Payer: Self-pay | Admitting: Allergy & Immunology

## 2019-11-10 NOTE — Telephone Encounter (Signed)
Called patient to schedule office visit for insurance purposes for shots.

## 2019-11-11 ENCOUNTER — Ambulatory Visit (INDEPENDENT_AMBULATORY_CARE_PROVIDER_SITE_OTHER): Payer: Medicare HMO | Admitting: Allergy & Immunology

## 2019-11-11 ENCOUNTER — Ambulatory Visit: Payer: Self-pay

## 2019-11-11 ENCOUNTER — Other Ambulatory Visit: Payer: Self-pay

## 2019-11-11 ENCOUNTER — Encounter: Payer: Self-pay | Admitting: Allergy & Immunology

## 2019-11-11 VITALS — BP 122/88 | HR 133 | Temp 97.0°F | Resp 18 | Ht 63.0 in | Wt 221.8 lb

## 2019-11-11 DIAGNOSIS — J309 Allergic rhinitis, unspecified: Secondary | ICD-10-CM

## 2019-11-11 DIAGNOSIS — J302 Other seasonal allergic rhinitis: Secondary | ICD-10-CM

## 2019-11-11 DIAGNOSIS — J3089 Other allergic rhinitis: Secondary | ICD-10-CM

## 2019-11-11 NOTE — Patient Instructions (Addendum)
1. Allergic rhinoconjunctivitis - on allergy shots - Continue with allergy shots at the same schedule.  - We are going to space out to every 4 weeks once you reach 0.5 mL.   2. Mild intermittent asthma, uncomplicated - Continue albuterol 4 puffs every 4-6 hours as needed. - We are going to remove this from your diagnosis list.  3. Return in about 1 year (around 11/10/2020). This can be an in-person, a virtual Webex or a telephone follow up visit.   Please inform us of any Emergency Department visits, hospitalizations, or changes in symptoms. Call us before going to the ED for breathing or allergy symptoms since we might be able to fit you in for a sick visit. Feel free to contact us anytime with any questions, problems, or concerns.  It was a pleasure to see you again today!  Websites that have reliable patient information: 1. American Academy of Asthma, Allergy, and Immunology: www.aaaai.org 2. Food Allergy Research and Education (FARE): foodallergy.org 3. Mothers of Asthmatics: http://www.asthmacommunitynetwork.org 4. American College of Allergy, Asthma, and Immunology: www.acaai.org  "Like" [place] on Facebook and Instagram for our latest updates!        Make sure you are registered to vote! If you have moved or changed any of your contact information, you will need to get this updated before voting!  In some cases, you MAY be able to register to vote online: [email or web address]

## 2019-11-11 NOTE — Progress Notes (Signed)
FOLLOW UP  Date of Service/Encounter:  11/11/19  Referring provider: Unk Pinto, MD   Assessment:   Seasonal and perennial allergic rhinitis  Plan/Recommendations:   1. Allergic rhinoconjunctivitis - on allergy shots - Continue with allergy shots at the same schedule.  - We are going to space out to every 4 weeks once you reach 0.5 mL.   2. Mild intermittent asthma, uncomplicated - Continue albuterol 4 puffs every 4-6 hours as needed. - We are going to remove this from your diagnosis list.  3. Return in about 1 year (around 11/10/2020). This can be an in-person, a virtual Webex or a telephone follow up visit.   Subjective:   Grace Barry is a 69 y.o. female presenting today for evaluation of  Chief Complaint  Patient presents with  . Asthma    Grace Barry has a history of the following: Patient Active Problem List   Diagnosis Date Noted  . Hyperlipidemia associated with type 2 diabetes mellitus ([place]) 11/07/2018  . Osteopenia 11/07/2018  . Seasonal and perennial allergic rhinitis 07/17/2018  . Left ear hearing loss 03/28/2016  . Vitamin D deficiency disease 05/19/2015  . Medication management 05/19/2015  . Migraine 02/19/2015  . Vertigo 02/19/2015  . History of colonic polyps 01/15/2015  . Morbid obesity (Grace Barry) 07/01/2014  . CKD stage 2 due to type 2 diabetes mellitus ([place]) 07/01/2014  . Fatty liver   . Arthritis   . Hypothyroidism 12/15/2010  . T2_NIDDM w/CKD (GFR 56 ml/min) 12/15/2010  . Depression, major, recurrent, in remission ([place]) 12/15/2010  . Obstructive sleep apnea 12/15/2010  . Essential hypertension 12/15/2010  . GERD 12/15/2010    History obtained from: chart review and patient.  Grace Fila Imparato was referred by Unk Pinto, MD.     Grace Barry is a 69 y.o. female presenting for a follow up visit.   Asthma/Respiratory Symptom History: She has not used her inhaler in three years. Cold foods make her cough and certain fumes.  However,  after a few coughs everything is back to normal.  She does not want to start any daily medication for asthma.  She cannot remember the last time that she needed prednisone for her breathing.  Allergic Rhinitis Symptom History: We did discuss stopping her allergy shots at the last visit. However, as she has been so stable while on the allergy shots, she prefers to not stop them.  She is going to see what her new Medicare plan covers, but she does not anticipate wanting to stop them if her plan continues to cover them.  She was on monthly for period of time, but apparently she missed a couple of months during the worst of the COVID-19 pandemic.  Therefore, she had the start at a lower 1 and they have her every week at this point.  She is wondering if she can change to monthly instead.  She had been on monthly previously for a number of years.  She remains busy, despite the coronavirus pandemic.  There have been doing a lot of traveling in the car to see children and grandkids.  Likely, no one has been infected with COVID-19 at this point. She did smuggle several kringles out of [place]. She buys the ones in [place].   Otherwise, there is no history of other atopic diseases, including asthma, drug allergies, eczema, urticaria or contact dermatitis. There is no significant infectious history. Vaccinations are up to date.       Review of Systems  Constitutional:  Negative.  Negative for chills, fever, malaise/fatigue and weight loss.  HENT: Negative.  Negative for congestion, ear discharge and ear pain.   Eyes: Negative for pain, discharge and redness.  Respiratory: Negative for cough, sputum production, shortness of breath and wheezing.   Cardiovascular: Negative.  Negative for chest pain and palpitations.  Gastrointestinal: Negative for abdominal pain, diarrhea, heartburn, nausea and vomiting.  Skin: Negative.  Negative for itching and rash.  Neurological: Negative for dizziness and headaches.   Endo/Heme/Allergies: Negative for environmental allergies. Does not bruise/bleed easily.       Objective:   Blood pressure 122/88, pulse (!) 133, temperature (!) 97 F (36.1 C), temperature source Temporal, resp. rate 18, height 5\' 3"  (1.6 m), weight 221 lb 12.8 oz (100.6 kg), SpO2 96 %. Body mass index is 39.29 kg/m.   Physical Exam:   Physical Exam  Constitutional: She appears well-developed.  Pleasant female.  Cooperative with the exam.  Very talkative.  HENT:  Head: Normocephalic and atraumatic.  Right Ear: Tympanic membrane, external ear and ear canal normal.  Left Ear: Tympanic membrane, external ear and ear canal normal.  Nose: Mucosal edema and rhinorrhea present. No nasal deformity or septal deviation. No epistaxis. Right sinus exhibits no maxillary sinus tenderness and no frontal sinus tenderness. Left sinus exhibits no maxillary sinus tenderness and no frontal sinus tenderness.  Mouth/Throat: Uvula is midline and oropharynx is clear and moist. Mucous membranes are not pale and not dry.  Turbinates are slightly enlarged.  Eyes: Pupils are equal, round, and reactive to light. Conjunctivae and EOM are normal. Right eye exhibits no chemosis and no discharge. Left eye exhibits no chemosis and no discharge. Right conjunctiva is not injected. Left conjunctiva is not injected.  Cardiovascular: Normal rate, regular rhythm and normal heart sounds.  Respiratory: Effort normal and breath sounds normal. No accessory muscle usage. No tachypnea. No respiratory distress. She has no wheezes. She has no rhonchi. She has no rales. She exhibits no tenderness.  Moving air well in all lung fields.  Lymphadenopathy:    She has no cervical adenopathy.  Neurological: She is alert.  Skin: No abrasion, no petechiae and no rash noted. Rash is not papular, not vesicular and not urticarial. No erythema. No pallor.  Psychiatric: She has a normal mood and affect.     Diagnostic studies:  none      Salvatore Marvel, MD [facility]

## 2019-11-12 ENCOUNTER — Encounter: Payer: Self-pay | Admitting: Adult Health

## 2019-11-13 ENCOUNTER — Other Ambulatory Visit: Payer: Self-pay | Admitting: Internal Medicine

## 2019-11-13 ENCOUNTER — Other Ambulatory Visit: Payer: Self-pay | Admitting: Physician Assistant

## 2019-11-17 ENCOUNTER — Ambulatory Visit: Payer: Self-pay | Admitting: Adult Health

## 2019-11-24 ENCOUNTER — Ambulatory Visit (INDEPENDENT_AMBULATORY_CARE_PROVIDER_SITE_OTHER): Payer: Medicare HMO

## 2019-11-24 DIAGNOSIS — J309 Allergic rhinitis, unspecified: Secondary | ICD-10-CM

## 2019-12-05 ENCOUNTER — Ambulatory Visit (INDEPENDENT_AMBULATORY_CARE_PROVIDER_SITE_OTHER): Payer: Medicare HMO | Admitting: *Deleted

## 2019-12-05 DIAGNOSIS — J309 Allergic rhinitis, unspecified: Secondary | ICD-10-CM

## 2019-12-12 DIAGNOSIS — I700 Atherosclerosis of aorta: Secondary | ICD-10-CM | POA: Insufficient documentation

## 2019-12-12 NOTE — Progress Notes (Signed)
MEDICARE ANNUAL WELLNESS VISIT AND FOLLOW UP  Assessment:   Encounter for Medicare annual wellness exam  Atherosclerosis of aorta Per CT 07/2019 Control blood pressure, cholesterol, glucose, increase exercise.    Essential hypertension - continue medications, DASH diet, exercise and monitor at home. Call if greater than 130/80.  - CBC with Differential/Platelet - CMP/GFR - TSH   Obstructive sleep apnea Sleep apnea- continue mouth piece, weight loss advised.   Asthma, unspecified asthma severity, uncomplicated controlled  Gastroesophageal reflux disease without esophagitis Continue PPI/H2 blocker, diet discussed  Other specified hypothyroidism Hypothyroidism-check TSH level, continue medications the same, reminded to take on an empty stomach 30-15mins before food.   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 - Hemoglobin A1c  CKD stage 2 associated with T2DM ([facility]) Increase fluids, avoid NSAIDS, monitor sugars, will monitor - CMP WITH GFR  Hyperlipidemia associated with T2DM ([place])  -continue medications, check lipids, decrease fatty foods, increase activity.  - Lipid panel  Medication management - Magnesium  Arthritis RICE, NSAIDS PRN, ortho referral if needed  Diarrhea IBS, controlled   History of colonic polyps Due - referral placed  Depression, major, recurrent, in remission ([place]) In remission on medications; continue paxil   Lifestyle discussed: diet/exerise, sleep hygiene, stress management, hydration  Morbid obesity ([place]) - increase veggies, decrease carbs - long discussion about weight loss, diet, and exercise - phentermine d/c'd due to lack of benefit - patient admits to emotional  eating; strategies discussed, get chocolate out of the house, get husband on board, have plan for when cravings hit, try protein heavy snacks, information provided  Hypothyroidism continue medications the same pending lab results reminded to take on an empty stomach 30-84mins before food.  check TSH level  Liver disease/ fatty liver disease Check labs, avoid tylenol, alcohol, weight loss advised.   Bil cerumen impaction - stop using Qtips, irrigation used in the office without complications, use OTC drops/oil at home to prevent reoccurence   Over 30 minutes of exam, counseling, chart review, and critical decision making was performed  Future Appointments  Date Time Provider [place]  06/14/2020  2:00 PM Vicie Mutters, PA-C [facility] None  07/28/2020 11:15 AM Suzzanne Cloud, NP [facility] None  11/16/2020  2:30 PM Valentina Shaggy, MD [facility] None     Plan:   During the course of the visit the patient was educated and counseled about appropriate screening and preventive services including:    Pneumococcal vaccine   Influenza vaccine  Td vaccine  Screening electrocardiogram  Screening mammography  Bone densitometry screening  Colorectal cancer screening  Diabetes screening  Glaucoma screening  Nutrition counseling   Advanced directives: given info/requested   Subjective:   Grace Barry is a 69 y.o. female who presents for Welcome to medicare visit and 3 month follow up on hypertension, T2 diabetes with CKD, hyperlipidemia, vitamin D def.   She and her husband travel a lot, none this year.   She has followed with Dr. Toy Cookey and is on a mouth piece for OSA that is helping her significantly with sleep.   She has history of migraines, she has been following with Dr. Jannifer Franklin for balance and mental fogging, changed some medications which helped. Currently on topiramate which seems to be working well.   She is hx of depression for many  years in  remission on paxil 40 mg daily, doing well with this dose.   She is in bilateral knee braces due to patellar issue per Dr. Jimmye Norman, chiropractor, much improved with this.   BMI is Body mass index is 39.68 kg/m., she has not been working on diet and exercise. She eats 1-2 meals a day. She emotionally and bored eat. She drink 120 oz of water a day, with crystal lite very diluted. She has not been working out since pandemic and knee pain, plans to restart water aerobics once able.   she is prescribed phentermine and topamax for weight loss.  While on the medication they have lost 0 lbs since last visit. They deny palpitations, anxiety, trouble sleeping, elevated BP.  Wt Readings from Last 3 Encounters:  12/15/19 224 lb (101.6 kg)  11/11/19 221 lb 12.8 oz (100.6 kg)  09/10/19 220 lb 12.8 oz (100.2 kg)    She had CT coronary 07/2019 -CAD-RADS 1. Minimal non-obstructive CAD (0-24%) but did show aortic atherosclerosis Her blood pressure has been controlled at home,  Metoprolol 50mg  XL for palpitations, losartan  100 mg,BP: 124/74  She does workout. She denies chest pain, shortness of breath, dizziness.   She is on cholesterol medication, crestor 20mg  daily and denies myalgias. Her cholesterol is at goal. The cholesterol was:   Lab Results  Component Value Date   CHOL 125 09/10/2019   HDL 40 (L) 09/10/2019   LDLCALC 53 09/10/2019   TRIG 304 (H) 09/10/2019   CHOLHDL 3.1 09/10/2019   She has been working on diet and exercise for Diabetes with CKD III controlled on metformin and invokana, she is on bASA, she is on ACE/ARB, and denies paresthesia of the feet, polydipsia, polyuria and visual disturbances. Last A1C was:  Lab Results  Component Value Date   HGBA1C 6.4 (H) 09/10/2019    She has CKD IIIa associated with T2DM monitored at this office:  Lab Results  Component Value Date   GFRNONAA 55 (L) 09/10/2019   Patient is on Vitamin D supplement and at goal at recent check:  Lab Results   Component Value Date   VD25OH 87 09/10/2019   She is on thyroid medication. Her medication was not changed last visit,.  Lab Results  Component Value Date   TSH 2.78 09/10/2019      Medication Review Current Outpatient Medications on File Prior to Visit  Medication Sig  . aspirin 81 MG tablet Take 81 mg by mouth daily.  . baclofen (LIORESAL) 10 MG tablet Take 1 tablet (10 mg total) by mouth 2 (two) times daily as needed for muscle spasms.  Marland Kitchen CALCIUM PO Take 1,200 mg by mouth daily.  . canagliflozin (INVOKANA) 300 MG TABS tablet Take 1 tablet (300 mg total) by mouth daily before breakfast.  . cetirizine (ZYRTEC) 10 MG tablet Take 10 mg by mouth as needed. Reported on 10/20/2015  . Cyanocobalamin (VITAMIN B12 SL) Place 1 tablet under the tongue daily.  Marland Kitchen EPINEPHrine (EPIPEN 2-PAK) 0.3 mg/0.3 mL IJ SOAJ injection Inject 0.3 mg into the muscle once.  . famotidine (PEPCID) 40 MG tablet TAKE 1 TABLET BY MOUTH TWICE A DAY. ( REPLACING ZANTAC/RANITIDINE)  . fluconazole (DIFLUCAN) 150 MG tablet Take 1 tablet (150 mg total) by mouth daily. (Patient taking differently: Take 150 mg by mouth as needed. )  . glucose blood (ONE TOUCH ULTRA TEST) test strip Check Blood sugar  daily  . Lancets (ONETOUCH ULTRASOFT) lancets Check Blood sugar  daily  .  losartan (COZAAR) 100 MG tablet TAKE 1 TABLET DAILY FOR BP AND KIDNEY PROTECTION  . Magnesium 250 MG TABS Take 250 mg by mouth 2 (two) times daily.  . metFORMIN (GLUCOPHAGE-XR) 500 MG 24 hr tablet TAKE 2 TABLETS BY MOUTH TWICE DAILY WITH FOOD FOR DIABETES  . metoprolol succinate (TOPROL-XL) 50 MG 24 hr tablet TAKE 1 TABLET (50 MG TOTAL) BY MOUTH DAILY. TAKE WITH OR IMMEDIATELY FOLLOWING A MEAL.  . Multiple Vitamins-Minerals (MULTIVITAMIN PO) Take 1 tablet by mouth daily.   . Omega-3 Fatty Acids (FISH OIL PO) Take by mouth daily.  Marland Kitchen PARoxetine (PAXIL) 40 MG tablet Take 1 tablet Daily for Mood  . PRESCRIPTION MEDICATION Inject as directed every 30 (thirty)  days. Allergy Shot  . rosuvastatin (CRESTOR) 20 MG tablet Take 1 tablet Daily for Cholesterol  . SYNTHROID 200 MCG tablet Take 1 tablet daily on an empty stomach with only water for 30 minutes & no Antacid meds, Calcium or Magnesium for 4 hours & avoid Biotin  . topiramate (TOPAMAX) 50 MG tablet Take 1/2 to 1 tablet 2 x /day with Supper & Bedtime for Dieting & Weight Loss  . Turmeric (QC TUMERIC COMPLEX PO) Take by mouth.  . Vitamin D, Ergocalciferol, (DRISDOL) 1.25 MG (50000 UT) CAPS capsule TAKE 1 CAPSULE 4 X /WEEK FOR VIT D DEFICIENCY  . clindamycin (CLEOCIN) 150 MG capsule TAKE 2 CAPSULES BY MOUTH NOW,THEN TAKE 1 CAPSULE BY MOUTH 3 TIMES A DAY FOR DENTAL INFECTION   No current facility-administered medications on file prior to visit.    Current Problems (verified) Patient Active Problem List   Diagnosis Date Noted  . Aortic atherosclerosis ([place]) 12/12/2019  . Hyperlipidemia associated with type 2 diabetes mellitus ([place]) 11/07/2018  . Osteopenia 11/07/2018  . Seasonal and perennial allergic rhinitis 07/17/2018  . Left ear hearing loss 03/28/2016  . Vitamin D deficiency disease 05/19/2015  . Medication management 05/19/2015  . Migraine 02/19/2015  . Vertigo 02/19/2015  . History of colonic polyps 01/15/2015  . Morbid obesity ([place]) 07/01/2014  . CKD stage 2 due to type 2 diabetes mellitus ([place]) 07/01/2014  . Fatty liver   . Arthritis   . Hypothyroidism 12/15/2010  . T2_NIDDM w/CKD (GFR 56 ml/min) 12/15/2010  . Depression, major, recurrent, in remission ([place]) 12/15/2010  . Obstructive sleep apnea 12/15/2010  . Essential hypertension 12/15/2010  . GERD 12/15/2010    Screening Tests Immunization History  Administered Date(s) Administered  . Influenza, High Dose Seasonal PF 08/10/2015, 08/10/2016, 08/28/2017, 11/07/2018, 08/07/2019  . Influenza-Unspecified 08/05/2019  . PFIZER SARS-COV-2 Vaccination 11/19/2019, 12/10/2019  . Pneumococcal Conjugate-13 07/01/2014  . Pneumococcal  Polysaccharide-23 10/09/2002, 05/26/2008, 12/13/2016  . Td 10/09/2002, 10/30/2010  . Tdap 08/10/2015  . Zoster 06/20/2011     Preventative care: Tetanus: 2016 Pneumovax: 2018 Prevnar 13: 2015 Flu vaccine: 07/2019 Zostavax: 2012  Pap: 2009 remote declines another MGM: 10/2018, due patient will schedule DEXA: 2020 osteopenia -1.5 CXR 2013 Colonoscopy: 10/2014 due 2019 precancerous polyps Dr. Deatra Ina,  EGD: 10/2014 + gastritis MRI 11/20/2017 PFT 06/2018  CT coronary 07/2019 -CAD-RADS 1. Minimal non-obstructive CAD (0-24%).  aortic atherosclerosis  Names of Other Physician/Practitioners you currently use: 1. [facility]- here for primary care 2. Dr. Wynetta Emery, [place], eye doctor, last visit 2019, patient has insurance again, plans to schedule  3. Dr. Betsey Holiday, dentist, last visit q 6 months 4. Dr. Domingo Cocking, neuro  5. Dr. Toy Cookey, sleep apnea 6. Dr. Deatra Ina GI   Patient Care Team: Unk Pinto, MD  as PCP - General (Internal Medicine) Jola Baptist, DC as Anesthesiologist (Chiropractic Medicine)   Allergies Allergies  Allergen Reactions  . Floxin [Ofloxacin] Hives  . Viberzi [Eluxadoline] Hives  . Doxycycline Rash  . Penicillins Rash    SURGICAL HISTORY She  has a past surgical history that includes Total knee arthroplasty; Cholecystectomy; Abdominal hysterectomy; Cesarean section; Tonsillectomy; Knee arthroscopy; Carpal tunnel release; Hernia repair (07/24/11); Joint replacement; Adenoidectomy; and Sinoscopy. FAMILY HISTORY Her family history includes Breast cancer in her mother; Colon cancer in her son; Esophageal cancer (age of onset: 63) in her son; Hyperlipidemia in her father and sister; Hypertension in her father; Prostate cancer in her father; Stomach cancer in her son. SOCIAL HISTORY She  reports that she has never smoked. She has never used smokeless tobacco. She reports current alcohol use. She reports that she does not use  drugs.  MEDICARE WELLNESS OBJECTIVES: Physical activity: Current Exercise Habits: The patient does not participate in regular exercise at present, Exercise limited by: orthopedic condition(s) Cardiac risk factors: Cardiac Risk Factors include: advanced age (>70men, >32 women);dyslipidemia;hypertension;diabetes mellitus;obesity (BMI >30kg/m2);sedentary lifestyle;family history of premature cardiovascular disease Depression/mood screen:   Depression screen [facility] 2/9 12/15/2019  Decreased Interest 0  Down, Depressed, Hopeless 0  PHQ - 2 Score 0  Altered sleeping -  Tired, decreased energy -  Change in appetite -  Feeling bad or failure about yourself  -  Trouble concentrating -  Moving slowly or fidgety/restless -  Suicidal thoughts -  PHQ-9 Score -    ADLs:  In your present state of health, do you have any difficulty performing the following activities: 12/15/2019 09/14/2019  Hearing? N N  Vision? N N  Difficulty concentrating or making decisions? N N  Walking or climbing stairs? Y N  Comment struggles with stairs, has rail, can do slowly if needed -  Dressing or bathing? N N  Doing errands, shopping? N N  Some recent data might be hidden     Cognitive Testing  Alert? Yes  Normal Appearance?Yes  Oriented to person? Yes  Place? Yes   Time? Yes  Recall of three objects?  Yes  Can perform simple calculations? Yes  Displays appropriate judgment?Yes  Can read the correct time from a watch face?Yes  EOL planning: Does Patient Have a Medical Advance Directive?: Yes Type of Advance Directive: Healthcare Power of Attorney, Living will Does patient want to make changes to medical advance directive?: No - Patient declined Copy of [place] in Chart?: No - copy requested  Review of Systems:  Review of Systems  Constitutional: Negative.   HENT: Negative for congestion, ear discharge, ear pain, hearing loss, nosebleeds, sore throat and tinnitus.   Eyes: Negative.    Respiratory: Negative.  Negative for stridor.   Cardiovascular: Negative.   Gastrointestinal: Negative for abdominal pain, blood in stool, constipation, diarrhea, heartburn, melena, nausea and vomiting.  Genitourinary: Negative.   Musculoskeletal: Negative.  Negative for falls.  Skin: Negative.   Neurological: Positive for headaches. Negative for dizziness, tingling, tremors, sensory change, speech change, focal weakness, seizures and loss of consciousness.  Endo/Heme/Allergies: Negative.   Psychiatric/Behavioral: Negative for depression, substance abuse and suicidal ideas. The patient is not nervous/anxious and does not have insomnia.      Objective:   Today's Vitals   12/15/19 1612  BP: 124/74  Pulse: 83  Temp: (!) 97.3 F (36.3 C)  SpO2: 98%  Weight: 224 lb (101.6 kg)  Height: 5\' 3"  (1.6 m)  General appearance: alert, no distress, WD/WN,  female HEENT: normocephalic, sclerae anicteric, bil soft wax obstructing ext canals, TMs not visualized, nares patent, no discharge or erythema, pharynx normal Oral cavity: MMM, no lesions Neck: supple, no lymphadenopathy, no thyromegaly, no masses Heart: RRR, normal S1, S2, no murmurs Lungs: CTA bilaterally, no wheezes, rhonchi, or rales Abdomen: +bs, soft, non tender, non distended, no masses, no hepatomegaly, no splenomegaly Musculoskeletal: nontender, no swelling, no obvious deformity Extremities: no edema, no cyanosis, no clubbing Pulses: 2+ symmetric, upper and lower extremities, normal cap refill Neurological: alert, oriented x 3, CN2-12 intact, strength normal upper extremities and lower extremities, sensation normal throughout, DTRs 2+ throughout, no cerebellar signs, gait normal Psychiatric: normal affect, behavior normal, pleasant  Breast: defer Gyn: defer Rectal: defer  Medicare Attestation I have personally reviewed: The patient's medical and social history Their use of alcohol, tobacco or illicit drugs Their current  medications and supplements The patient's functional ability including ADLs,fall risks, home safety risks, cognitive, and hearing and visual impairment Diet and physical activities Evidence for depression or mood disorders  The patient's weight, height, BMI, and visual acuity have been recorded in the chart.  I have made referrals, counseling, and provided education to the patient based on review of the above and I have provided the patient with a written personalized care plan for preventive services.     Izora Ribas, NP   12/15/2019

## 2019-12-15 ENCOUNTER — Encounter: Payer: Self-pay | Admitting: Adult Health

## 2019-12-15 ENCOUNTER — Ambulatory Visit (INDEPENDENT_AMBULATORY_CARE_PROVIDER_SITE_OTHER): Payer: Medicare HMO | Admitting: Adult Health

## 2019-12-15 ENCOUNTER — Ambulatory Visit: Payer: Self-pay | Admitting: Adult Health

## 2019-12-15 ENCOUNTER — Other Ambulatory Visit: Payer: Self-pay

## 2019-12-15 ENCOUNTER — Encounter: Payer: Self-pay | Admitting: Internal Medicine

## 2019-12-15 VITALS — BP 124/74 | HR 83 | Temp 97.3°F | Ht 63.0 in | Wt 224.0 lb

## 2019-12-15 DIAGNOSIS — K760 Fatty (change of) liver, not elsewhere classified: Secondary | ICD-10-CM | POA: Diagnosis not present

## 2019-12-15 DIAGNOSIS — Z1211 Encounter for screening for malignant neoplasm of colon: Secondary | ICD-10-CM

## 2019-12-15 DIAGNOSIS — I700 Atherosclerosis of aorta: Secondary | ICD-10-CM | POA: Diagnosis not present

## 2019-12-15 DIAGNOSIS — R6889 Other general symptoms and signs: Secondary | ICD-10-CM

## 2019-12-15 DIAGNOSIS — Z860100 Personal history of colon polyps, unspecified: Secondary | ICD-10-CM

## 2019-12-15 DIAGNOSIS — E559 Vitamin D deficiency, unspecified: Secondary | ICD-10-CM

## 2019-12-15 DIAGNOSIS — F3340 Major depressive disorder, recurrent, in remission, unspecified: Secondary | ICD-10-CM

## 2019-12-15 DIAGNOSIS — Z0000 Encounter for general adult medical examination without abnormal findings: Secondary | ICD-10-CM

## 2019-12-15 DIAGNOSIS — H6123 Impacted cerumen, bilateral: Secondary | ICD-10-CM

## 2019-12-15 DIAGNOSIS — G4733 Obstructive sleep apnea (adult) (pediatric): Secondary | ICD-10-CM

## 2019-12-15 DIAGNOSIS — Z0001 Encounter for general adult medical examination with abnormal findings: Secondary | ICD-10-CM | POA: Diagnosis not present

## 2019-12-15 DIAGNOSIS — G43009 Migraine without aura, not intractable, without status migrainosus: Secondary | ICD-10-CM

## 2019-12-15 DIAGNOSIS — E1169 Type 2 diabetes mellitus with other specified complication: Secondary | ICD-10-CM | POA: Diagnosis not present

## 2019-12-15 DIAGNOSIS — E1129 Type 2 diabetes mellitus with other diabetic kidney complication: Secondary | ICD-10-CM

## 2019-12-15 DIAGNOSIS — E039 Hypothyroidism, unspecified: Secondary | ICD-10-CM | POA: Diagnosis not present

## 2019-12-15 DIAGNOSIS — Z1159 Encounter for screening for other viral diseases: Secondary | ICD-10-CM

## 2019-12-15 DIAGNOSIS — E1122 Type 2 diabetes mellitus with diabetic chronic kidney disease: Secondary | ICD-10-CM | POA: Diagnosis not present

## 2019-12-15 DIAGNOSIS — Z79899 Other long term (current) drug therapy: Secondary | ICD-10-CM | POA: Diagnosis not present

## 2019-12-15 DIAGNOSIS — I10 Essential (primary) hypertension: Secondary | ICD-10-CM | POA: Diagnosis not present

## 2019-12-15 DIAGNOSIS — M1990 Unspecified osteoarthritis, unspecified site: Secondary | ICD-10-CM

## 2019-12-15 DIAGNOSIS — J3089 Other allergic rhinitis: Secondary | ICD-10-CM

## 2019-12-15 DIAGNOSIS — K219 Gastro-esophageal reflux disease without esophagitis: Secondary | ICD-10-CM

## 2019-12-15 DIAGNOSIS — J302 Other seasonal allergic rhinitis: Secondary | ICD-10-CM

## 2019-12-15 DIAGNOSIS — M8580 Other specified disorders of bone density and structure, unspecified site: Secondary | ICD-10-CM

## 2019-12-15 DIAGNOSIS — R42 Dizziness and giddiness: Secondary | ICD-10-CM

## 2019-12-15 DIAGNOSIS — E785 Hyperlipidemia, unspecified: Secondary | ICD-10-CM | POA: Diagnosis not present

## 2019-12-15 NOTE — Patient Instructions (Addendum)
Grace Barry , Thank you for taking time to come for your Medicare Wellness Visit. I appreciate your ongoing commitment to your health goals. Please review the following plan we discussed and let me know if I can assist you in the future.   These are the goals we discussed: Goals    . Weight (lb) < 200 lb (90.7 kg)     Try to cut back on chocolate, avoid emotional/habitual eating Check weight once a week        This is a list of the screening recommended for you and due dates:  Health Maintenance  Topic Date Due  .  Hepatitis C: One time screening is recommended by Center for Disease Control  (CDC) for  adults born from [birth year] through 1967.   [birth year]  . Colon Cancer Screening  11/17/2017  . Eye exam for diabetics  09/25/2018  . Hemoglobin A1C  03/09/2020  . Mammogram  11/19/2020  . Complete foot exam   12/14/2020  . Tetanus Vaccine  08/09/2025  . Flu Shot  Completed  . DEXA scan (bone density measurement)  Completed  . Pneumonia vaccines  Completed    Mammogram and diabetic eye exam     Are you an emotional eater? Do you eat more when you're feeling stressed? Do you eat when you're not hungry or when you're full? Do you eat to feel better (to calm and soothe yourself when you're sad, mad, bored, anxious, etc.)? Do you reward yourself with food? Do you regularly eat until you've stuffed yourself? Does food make you feel safe? Do you feel like food is a friend? Do you feel powerless or out of control around food?  If you answered yes to some of these questions than it is likely that you are an emotional eater. This is normally a learned behavior and can take time to first recognize the signs and second BREAK THE HABIT. But here is more information and tips to help.   The difference between emotional hunger and physical hunger Emotional hunger can be powerful, so it's easy to mistake it for physical hunger. But there are clues you can look for to help you tell physical and  emotional hunger apart.  Emotional hunger comes on suddenly. It hits you in an instant and feels overwhelming and urgent. Physical hunger, on the other hand, comes on more gradually. The urge to eat doesn't feel as dire or demand instant satisfaction (unless you haven't eaten for a very long time).  Emotional hunger craves specific comfort foods. When you're physically hungry, almost anything sounds good--including healthy stuff like vegetables. But emotional hunger craves junk food or sugary snacks that provide an instant rush. You feel like you need cheesecake or pizza, and nothing else will do.  Emotional hunger often leads to mindless eating. Before you know it, you've eaten a whole bag of chips or an entire pint of ice cream without really paying attention or fully enjoying it. When you're eating in response to physical hunger, you're typically more aware of what you're doing.  Emotional hunger isn't satisfied once you're full. You keep wanting more and more, often eating until you're uncomfortably stuffed. Physical hunger, on the other hand, doesn't need to be stuffed. You feel satisfied when your stomach is full.  Emotional hunger isn't located in the stomach. Rather than a growling belly or a pang in your stomach, you feel your hunger as a craving you can't get out of your head. You're focused on specific  textures, tastes, and smells.  Emotional hunger often leads to regret, guilt, or shame. When you eat to satisfy physical hunger, you're unlikely to feel guilty or ashamed because you're simply giving your body what it needs. If you feel guilty after you eat, it's likely because you know deep down that you're not eating for nutritional reasons.  Identify your emotional eating triggers What situations, places, or feelings make you reach for the comfort of food? Most emotional eating is linked to unpleasant feelings, but it can also be triggered by positive emotions, such as rewarding yourself  for achieving a goal or celebrating a holiday or happy event. Common causes of emotional eating include:  Stuffing emotions - Eating can be a way to temporarily silence or "stuff down" uncomfortable emotions, including anger, fear, sadness, anxiety, loneliness, resentment, and shame. While you're numbing yourself with food, you can avoid the difficult emotions you'd rather not feel.  Boredom or feelings of emptiness - Do you ever eat simply to give yourself something to do, to relieve boredom, or as a way to fill a void in your life? You feel unfulfilled and empty, and food is a way to occupy your mouth and your time. In the moment, it fills you up and distracts you from underlying feelings of purposelessness and dissatisfaction with your life.  Childhood habits - Think back to your childhood memories of food. Did your parents reward good behavior with ice cream, take you out for pizza when you got a good report card, or serve you sweets when you were feeling sad? These habits can often carry over into adulthood. Or your eating may be driven by nostalgia--for cherished memories of grilling burgers in the backyard with your dad or baking and eating cookies with your mom.  Social influences - Getting together with other people for a meal is a great way to relieve stress, but it can also lead to overeating. It's easy to overindulge simply because the food is there or because everyone else is eating. You may also overeat in social situations out of nervousness. Or perhaps your family or circle of friends encourages you to overeat, and it's easier to go along with the group.  Stress - Ever notice how stress makes you hungry? It's not just in your mind. When stress is chronic, as it so often is in our chaotic, fast-paced world, your body produces high levels of the stress hormone, cortisol. Cortisol triggers cravings for salty, sweet, and fried foods--foods that give you a burst of energy and pleasure. The more  uncontrolled stress in your life, the more likely you are to turn to food for emotional relief.  Find other ways to feed your feelings If you don't know how to manage your emotions in a way that doesn't involve food, you won't be able to control your eating habits for very long. Diets so often fail because they offer logical nutritional advice which only works if you have conscious control over your eating habits. It doesn't work when emotions hijack the process, demanding an immediate payoff with food.  In order to stop emotional eating, you have to find other ways to fulfill yourself emotionally. It's not enough to understand the cycle of emotional eating or even to understand your triggers, although that's a huge first step. You need alternatives to food that you can turn to for emotional fulfillment.  Alternatives to emotional eating If you're depressed or lonely, call someone who always makes you feel better, play with your  dog or cat, or look at a favorite photo or cherished memento.  If you're anxious, expend your nervous energy by dancing to your favorite song, squeezing a stress ball, or taking a brisk walk.  If you're exhausted, treat yourself with a hot cup of tea, take a bath, light some scented candles, or wrap yourself in a warm blanket.  If you're bored, read a good book, watch a comedy show, explore the outdoors, or turn to an activity you enjoy (woodworking, playing the guitar, shooting hoops, scrapbooking, etc.).  What is mindful eating? Mindful eating is a practice that develops your awareness of eating habits and allows you to pause between your triggers and your actions. Most emotional eaters feel powerless over their food cravings. When the urge to eat hits, you feel an almost unbearable tension that demands to be fed, right now. Because you've tried to resist in the past and failed, you believe that your willpower just isn't up to snuff. But the truth is that you have more  power over your cravings than you think.  Take 5 before you give in to a craving Emotional eating tends to be automatic and virtually mindless. Before you even realize what you're doing, you've reached for a tub of ice cream and polished off half of it. But if you can take a moment to pause and reflect when you're hit with a craving, you give yourself the opportunity to make a different decision.  Can you put off eating for five minutes? Or just start with one minute. Don't tell yourself you can't give in to the craving; remember, the forbidden is extremely tempting. Just tell yourself to wait.  While you're waiting, check in with yourself. How are you feeling? What's going on emotionally? Even if you end up eating, you'll have a better understanding of why you did it. This can help you set yourself up for a different response next time.  How to practice mindful eating Eating while you're also doing other things--such as watching TV, driving, or playing with your phone--can prevent you from fully enjoying your food. Since your mind is elsewhere, you may not feel satisfied or continue eating even though you're no longer hungry. Eating more mindfully can help focus your mind on your food and the pleasure of a meal and curb overeating.   Eat your meals in a calm place with no distractions, aside from any dining companions.  Try eating with your non-dominant hand or using chopsticks instead of a knife and fork. Eating in such a non-familiar way can slow down how fast you eat and ensure your mind stays focused on your food.  Allow yourself enough time not to have to rush your meal. Set a timer for 20 minutes and pace yourself so you spend at least that much time eating.  Take small bites and chew them well, taking time to notice the different flavors and textures of each mouthful.  Put your utensils down between bites. Take time to consider how you feel--hungry, satiated--before picking up your  utensils again.  Try to stop eating before you are full.It takes time for the signal to reach your brain that you've had enough. Don't feel obligated to always clean your plate.  When you've finished your food, take a few moments to assess if you're really still hungry before opting for an extra serving or dessert.  Learn to accept your feelings--even the bad ones  While it may seem that the core problem is that  you're powerless over food, emotional eating actually stems from feeling powerless over your emotions. You don't feel capable of dealing with your feelings head on, so you avoid them with food.  Recommended reading  Mini Habits for weight loss  Healthy Eating: A guide to the new nutrition - [place] Report  10 Tips for Mindful Eating - How mindfulness can help you fully enjoy a meal and the experience of eating--with moderation and restraint. ([place])  Weight Loss: Gain Control of Emotional Eating - Tips to regain control of your eating habits. [facility])  Why Stress Causes People to Overeat -Tips on controlling stress eating. ([place])  Mindful Eating Meditations -Free online mindfulness meditations. ([employer]ful [employer])       Google mindful eating and here are some tips and tricks below.   Rate your hunger before you eat on a scale of 1-10, try to eat closer to a 6 or higher. And if you are at below that, why are you eating? Slow down and listen to your body.

## 2019-12-16 ENCOUNTER — Other Ambulatory Visit: Payer: Self-pay | Admitting: Adult Health

## 2019-12-16 DIAGNOSIS — N289 Disorder of kidney and ureter, unspecified: Secondary | ICD-10-CM

## 2019-12-16 DIAGNOSIS — E039 Hypothyroidism, unspecified: Secondary | ICD-10-CM

## 2019-12-16 LAB — CBC WITH DIFFERENTIAL/PLATELET
Absolute Monocytes: 724 cells/uL (ref 200–950)
Basophils Absolute: 76 cells/uL (ref 0–200)
Basophils Relative: 0.7 %
Eosinophils Absolute: 302 cells/uL (ref 15–500)
Eosinophils Relative: 2.8 %
HCT: 44.6 % (ref 35.0–45.0)
Hemoglobin: 14.8 g/dL (ref 11.7–15.5)
Lymphs Abs: 3802 cells/uL (ref 850–3900)
MCH: 29.5 pg (ref 27.0–33.0)
MCHC: 33.2 g/dL (ref 32.0–36.0)
MCV: 89 fL (ref 80.0–100.0)
MPV: 10.8 fL (ref 7.5–12.5)
Monocytes Relative: 6.7 %
Neutro Abs: 5897 cells/uL (ref 1500–7800)
Neutrophils Relative %: 54.6 %
Platelets: 298 10*3/uL (ref 140–400)
RBC: 5.01 10*6/uL (ref 3.80–5.10)
RDW: 12.8 % (ref 11.0–15.0)
Total Lymphocyte: 35.2 %
WBC: 10.8 10*3/uL (ref 3.8–10.8)

## 2019-12-16 LAB — COMPLETE METABOLIC PANEL WITH GFR
AG Ratio: 1.7 (calc) (ref 1.0–2.5)
ALT: 31 U/L — ABNORMAL HIGH (ref 6–29)
AST: 27 U/L (ref 10–35)
Albumin: 4.4 g/dL (ref 3.6–5.1)
Alkaline phosphatase (APISO): 76 U/L (ref 37–153)
BUN/Creatinine Ratio: 13 (calc) (ref 6–22)
BUN: 16 mg/dL (ref 7–25)
CO2: 27 mmol/L (ref 20–32)
Calcium: 9.9 mg/dL (ref 8.6–10.4)
Chloride: 102 mmol/L (ref 98–110)
Creat: 1.19 mg/dL — ABNORMAL HIGH (ref 0.60–0.93)
GFR, Est African American: 54 mL/min/{1.73_m2} — ABNORMAL LOW (ref >=60)
GFR, Est Non African American: 46 mL/min/{1.73_m2} — ABNORMAL LOW (ref >=60)
Globulin: 2.6 g/dL (calc) (ref 1.9–3.7)
Glucose, Bld: 101 mg/dL — ABNORMAL HIGH (ref 65–99)
Potassium: 4.3 mmol/L (ref 3.5–5.3)
Sodium: 139 mmol/L (ref 135–146)
Total Bilirubin: 0.5 mg/dL (ref 0.2–1.2)
Total Protein: 7 g/dL (ref 6.1–8.1)

## 2019-12-16 LAB — HEMOGLOBIN A1C
Hgb A1c MFr Bld: 6.7 % of total Hgb — ABNORMAL HIGH (ref <5.7)
Mean Plasma Glucose: 146 (calc)
eAG (mmol/L): 8.1 (calc)

## 2019-12-16 LAB — HEPATITIS C ANTIBODY
Hepatitis C Ab: NONREACTIVE
SIGNAL TO CUT-OFF: 0.01 (ref <1.00)

## 2019-12-16 LAB — MAGNESIUM: Magnesium: 2.2 mg/dL (ref 1.5–2.5)

## 2019-12-16 LAB — LIPID PANEL
Cholesterol: 142 mg/dL (ref <200)
HDL: 41 mg/dL — ABNORMAL LOW (ref >=50)
LDL Cholesterol (Calc): 69 mg/dL (calc)
Non-HDL Cholesterol (Calc): 101 mg/dL (calc) (ref <130)
Total CHOL/HDL Ratio: 3.5 (calc) (ref <5.0)
Triglycerides: 232 mg/dL — ABNORMAL HIGH (ref <150)

## 2019-12-16 LAB — TSH: TSH: 16.05 mIU/L — ABNORMAL HIGH (ref 0.40–4.50)

## 2019-12-18 ENCOUNTER — Other Ambulatory Visit: Payer: Self-pay | Admitting: Physician Assistant

## 2019-12-23 ENCOUNTER — Other Ambulatory Visit: Payer: Self-pay | Admitting: Adult Health

## 2019-12-27 ENCOUNTER — Other Ambulatory Visit: Payer: Self-pay | Admitting: Internal Medicine

## 2019-12-28 MED ORDER — FARXIGA 10 MG PO TABS: 10.0000 mg | ORAL_TABLET | Freq: Every day | ORAL | 3 refills | Status: DC

## 2020-01-07 ENCOUNTER — Ambulatory Visit (INDEPENDENT_AMBULATORY_CARE_PROVIDER_SITE_OTHER): Payer: Medicare HMO

## 2020-01-07 ENCOUNTER — Ambulatory Visit (AMBULATORY_SURGERY_CENTER): Payer: Self-pay | Admitting: *Deleted

## 2020-01-07 ENCOUNTER — Other Ambulatory Visit: Payer: Self-pay

## 2020-01-07 VITALS — Temp 95.8°F | Ht 63.0 in | Wt 220.0 lb

## 2020-01-07 DIAGNOSIS — Z86010 Personal history of colonic polyps: Secondary | ICD-10-CM

## 2020-01-07 DIAGNOSIS — J309 Allergic rhinitis, unspecified: Secondary | ICD-10-CM

## 2020-01-07 MED ORDER — SUPREP BOWEL PREP KIT 17.5-3.13-1.6 GM/177ML PO SOLN: ORAL | 0 refills | Status: DC

## 2020-01-07 NOTE — Progress Notes (Signed)
Pt had second vaccine 12-10-19- no need for pre procedure testing   Pt is aware that care partner will wait in the car during procedure; if they feel like they will be too hot or cold to wait in the car; they may wait in the 4 th floor lobby. Patient is aware to bring only one care partner. We want them to wear a mask (we do not have any that we can provide them), practice social distancing, and we will check their temperatures when they get here.  I did remind the patient that their care partner needs to stay in the parking lot the entire time and have a cell phone available, we will call them when the pt is ready for discharge. Patient will wear mask into building.   No trouble with anesthesia, difficulty with intubation or hx/fam hx of malignant hyperthermia per pt   No egg or soy allergy  No home oxygen use   No medications for weight loss taken  emmi information given

## 2020-01-12 ENCOUNTER — Ambulatory Visit (INDEPENDENT_AMBULATORY_CARE_PROVIDER_SITE_OTHER): Payer: Medicare HMO | Admitting: Physician Assistant

## 2020-01-12 ENCOUNTER — Encounter: Payer: Self-pay | Admitting: Physician Assistant

## 2020-01-12 ENCOUNTER — Other Ambulatory Visit: Payer: Self-pay

## 2020-01-12 VITALS — BP 124/82 | HR 76 | Temp 97.3°F | Wt 220.0 lb

## 2020-01-12 DIAGNOSIS — R319 Hematuria, unspecified: Secondary | ICD-10-CM | POA: Diagnosis not present

## 2020-01-12 DIAGNOSIS — R1030 Lower abdominal pain, unspecified: Secondary | ICD-10-CM

## 2020-01-12 DIAGNOSIS — N179 Acute kidney failure, unspecified: Secondary | ICD-10-CM | POA: Diagnosis not present

## 2020-01-12 DIAGNOSIS — E039 Hypothyroidism, unspecified: Secondary | ICD-10-CM

## 2020-01-12 MED ORDER — SULFAMETHOXAZOLE-TRIMETHOPRIM 800-160 MG PO TABS: 1.0000 | ORAL_TABLET | Freq: Two times a day (BID) | ORAL | 0 refills | Status: DC

## 2020-01-12 NOTE — Patient Instructions (Addendum)
Get on the bactrim If any fever, chills, blood in urine, unable to urinate, severe AB pain or worsening symptoms, patient will go to the ER.   Stop the invokana for now Will refer to hematology   Hematuria, Adult Hematuria is blood in the urine. Blood may be visible in the urine, or it may be identified with a test. This condition can be caused by infections of the bladder, urethra, kidney, or prostate. Other possible causes include:  Kidney stones.  Cancer of the urinary tract.  Too much calcium in the urine.  Conditions that are passed from parent to child (inherited conditions).  Exercise that requires a lot of energy. Infections can usually be treated with medicine, and a kidney stone usually will pass through your urine. If neither of these is the cause of your hematuria, more tests may be needed to identify the cause of your symptoms. It is very important to tell your health care provider about any blood in your urine, even if it is painless or the blood stops without treatment. Blood in the urine, when it happens and then stops and then happens again, can be a symptom of a very serious condition, including cancer. There is no pain in the initial stages of many urinary cancers. Follow these instructions at home: Medicines  Take over-the-counter and prescription medicines only as told by your health care provider.  If you were prescribed an antibiotic medicine, take it as told by your health care provider. Do not stop taking the antibiotic even if you start to feel better. Eating and drinking  Drink enough fluid to keep your urine clear or pale yellow. It is recommended that you drink 3-4 quarts (2.8-3.8 L) a day. If you have been diagnosed with an infection, it is recommended that you drink cranberry juice in addition to large amounts of water.  Avoid caffeine, tea, and carbonated beverages. These tend to irritate the bladder.  Avoid alcohol because it may irritate the prostate  (men). General instructions  If you have been diagnosed with a kidney stone, follow your health care provider's instructions about straining your urine to catch the stone.  Empty your bladder often. Avoid holding urine for long periods of time.  If you are female: ? After a bowel movement, wipe from front to back and use each piece of toilet paper only once. ? Empty your bladder before and after sex.  Pay attention to any changes in your symptoms. Tell your health care provider about any changes or any new symptoms.  It is your responsibility to get your test results. Ask your health care provider, or the department performing the test, when your results will be ready.  Keep all follow-up visits as told by your health care provider. This is important. Contact a health care provider if:  You develop back pain.  You have a fever.  You have nausea or vomiting.  Your symptoms do not improve after 3 days.  Your symptoms get worse. Get help right away if:  You develop severe vomiting and are unable take medicine without vomiting.  You develop severe pain in your back or abdomen even though you are taking medicine.  You pass a large amount of blood in your urine.  You pass blood clots in your urine.  You feel very weak or like you might faint.  You faint. Summary  Hematuria is blood in the urine. It has many possible causes.  It is very important that you tell your health  care provider about any blood in your urine, even if it is painless or the blood stops without treatment.  Take over-the-counter and prescription medicines only as told by your health care provider.  Drink enough fluid to keep your urine clear or pale yellow. This information is not intended to replace advice given to you by your health care provider. Make sure you discuss any questions you have with your health care provider. Document Revised: 03/12/2019 Document Reviewed: 11/18/2016 Elsevier Patient  Education  2020 [employer].

## 2020-01-12 NOTE — Progress Notes (Signed)
Subjective:    Patient ID: Grace Barry, female    DOB: 1951/10/26, 69 y.o.   MRN: [id number]  HPI 68 y.o. WF s/p hysterectomy presents with blood in her urine.  She is on invokana (has not started farxiga), metformin, and losartan.   She states Saturday she was urinating every 15 mins, some incontinence, with some blood on TP. Then Sunday she was having severe suprapubic pain, lower back pain, and gross hematuria with blood clots in it.  Then 4 this AM pain has improved until the last hour, she is having pain.  She has felt subjective fever and chills, highest is 98.7 at home.   She states towards the end of her urination she has sharp spasm pains, the rest of the time bilateral lower back achy, not worse with movement.  No nausea or vomiting. Appetite has been fine.   Her GFR has been treading down the last 3 visits, suppose to come in for urine and BMP recheck.  She has never had a kidney stone.  CT AB 2012  Lab Results  Component Value Date   GFRAA 54 (L) 12/15/2019    Lab Results  Component Value Date   TSH 16.05 (H) 12/15/2019    Blood pressure 124/82, pulse 76, temperature (!) 97.3 F (36.3 C), weight 220 lb (99.8 kg), SpO2 99 %.  Medications  Current Outpatient Medications (Endocrine & Metabolic):  .  dapagliflozin propanediol (FARXIGA) 10 MG TABS tablet, Take 10 mg by mouth daily. .  metFORMIN (GLUCOPHAGE-XR) 500 MG 24 hr tablet, Take 2 tablets 2 x /day with a meal for Diabetes .  SYNTHROID 200 MCG tablet, Take 1 tablet daily on an empty stomach with only water for 30 minutes & no Antacid meds, Calcium or Magnesium for 4 hours & avoid Biotin  Current Outpatient Medications (Cardiovascular):  Marland Kitchen  EPINEPHrine (EPIPEN 2-PAK) 0.3 mg/0.3 mL IJ SOAJ injection, Inject 0.3 mg into the muscle once. Marland Kitchen  losartan (COZAAR) 100 MG tablet, TAKE 1 TABLET DAILY FOR BP AND KIDNEY PROTECTION .  metoprolol succinate (TOPROL-XL) 50 MG 24 hr tablet, TAKE 1 TABLET (50 MG TOTAL) BY  MOUTH DAILY. TAKE WITH OR IMMEDIATELY FOLLOWING A MEAL. .  rosuvastatin (CRESTOR) 20 MG tablet, Take 1 tablet Daily for Cholesterol  Current Outpatient Medications (Respiratory):  .  cetirizine (ZYRTEC) 10 MG tablet, Take 10 mg by mouth as needed. Reported on 10/20/2015  Current Outpatient Medications (Analgesics):  .  aspirin 81 MG tablet, Take 81 mg by mouth daily.  Current Outpatient Medications (Hematological):  Marland Kitchen  Cyanocobalamin (VITAMIN B12 SL), Place 1 tablet under the tongue daily.  Current Outpatient Medications (Other):  .  baclofen (LIORESAL) 10 MG tablet, Take 1 tablet (10 mg total) by mouth 2 (two) times daily as needed for muscle spasms. Marland Kitchen  CALCIUM PO, Take 1,200 mg by mouth daily. .  famotidine (PEPCID) 40 MG tablet, TAKE 1 TABLET BY MOUTH TWICE A DAY. ( REPLACING ZANTAC/RANITIDINE) .  fluconazole (DIFLUCAN) 150 MG tablet, Take 1 tablet (150 mg total) by mouth daily. (Patient taking differently: Take 150 mg by mouth as needed. ) .  glucose blood (ONE TOUCH ULTRA TEST) test strip, Check Blood sugar  daily .  Lancets (ONETOUCH ULTRASOFT) lancets, Check Blood sugar  daily .  Magnesium 250 MG TABS, Take 250 mg by mouth 2 (two) times daily. .  Multiple Vitamins-Minerals (MULTIVITAMIN PO), Take 1 tablet by mouth daily.  .  Omega-3 Fatty Acids (FISH OIL PO), Take by  mouth daily. Marland Kitchen  PARoxetine (PAXIL) 40 MG tablet, Take 1 tablet Daily for Mood .  PRESCRIPTION MEDICATION, Inject as directed every 30 (thirty) days. Allergy Shot .  topiramate (TOPAMAX) 50 MG tablet, Take 1/2 to 1 tablet 2 x /day with Supper & Bedtime for Dieting & Weight Loss (Patient taking differently: Take 1/2 to 1 tablet daily) .  Turmeric (QC TUMERIC COMPLEX PO), Take by mouth. .  Vitamin D, Ergocalciferol, (DRISDOL) 1.25 MG (50000 UNIT) CAPS capsule, TAKE 1 CAPSULE 4 X /WEEK FOR VIT D DEFICIENCY .  Na Sulfate-K Sulfate-Mg Sulf (SUPREP BOWEL PREP KIT) 17.5-3.13-1.6 GM/177ML SOLN, Suprep as directed, no  substitutions (Patient not taking: Reported on 01/12/2020)  Problem list She has Hypothyroidism; T2_NIDDM w/CKD (GFR 56 ml/min); Depression, major, recurrent, in remission (Grace Barry); Obstructive sleep apnea; Essential hypertension; GERD; Fatty liver; Arthritis; Morbid obesity (Grace Barry); History of colonic polyps; Migraine; Vertigo; Vitamin D deficiency disease; Medication management; Left ear hearing loss; CKD stage 2 due to type 2 diabetes mellitus (Grace Barry); Seasonal and perennial allergic rhinitis; Hyperlipidemia associated with type 2 diabetes mellitus (Grace Barry); Osteopenia; and Aortic atherosclerosis ([facility]) on their problem list.   Review of Systems  Constitutional: Negative for chills.  HENT: Negative.   Respiratory: Negative.   Cardiovascular: Negative.   Gastrointestinal: Negative.  Negative for nausea and vomiting.  Genitourinary: Positive for dysuria, flank pain, frequency, hematuria and urgency. Negative for decreased urine volume, difficulty urinating, dyspareunia, enuresis, genital sores, menstrual problem, pelvic pain, vaginal bleeding, vaginal discharge and vaginal pain.       Objective:   Physical Exam Constitutional:      Appearance: She is well-developed.  Cardiovascular:     Rate and Rhythm: Normal rate and regular rhythm.  Pulmonary:     Effort: Pulmonary effort is normal.     Breath sounds: Normal breath sounds.  Abdominal:     General: Bowel sounds are normal. There is no distension.     Palpations: Abdomen is soft. There is no mass.     Tenderness: There is abdominal tenderness (right middle AB tenderness). There is no guarding or rebound.  Musculoskeletal:        General: No tenderness. Normal range of motion.     Cervical back: Normal range of motion and neck supple.  Skin:    General: Skin is warm and dry.  Neurological:     Mental Status: She is alert and oriented to person, place, and time.       Assessment & Plan:   Hematuria, unspecified type -     CBC with  Differential/Platelet -     COMPLETE METABOLIC PANEL WITH GFR -     Urinalysis, Routine w reflex microscopic -     Urine Culture -     CT Abdomen Pelvis Wo and with Contrast; Future- will get both to rule out kidney stone and cancer -     Ambulatory referral to Urology -     sulfamethoxazole-trimethoprim (BACTRIM DS) 800-160 MG tablet; Take 1 tablet by mouth 2 (two) times daily.  Hypothyroidism, unspecified type -     TSH  AKI (acute kidney injury) ([place]) -     COMPLETE METABOLIC PANEL WITH GFR  Lower abdominal pain -     CT Abdomen Pelvis Wo Contrast; Future -     Ambulatory referral to Urology -     sulfamethoxazole-trimethoprim (BACTRIM DS) 800-160 MG tablet; Take 1 tablet by mouth 2 (two) times daily.

## 2020-01-13 ENCOUNTER — Ambulatory Visit: Payer: Medicare HMO

## 2020-01-13 ENCOUNTER — Other Ambulatory Visit: Payer: Self-pay | Admitting: Family Medicine

## 2020-01-14 ENCOUNTER — Encounter: Payer: Self-pay | Admitting: Internal Medicine

## 2020-01-15 ENCOUNTER — Other Ambulatory Visit: Payer: Medicare HMO

## 2020-01-15 ENCOUNTER — Ambulatory Visit
Admission: RE | Admit: 2020-01-15 | Discharge: 2020-01-15 | Disposition: A | Discharge status: Home / Self Care | Payer: Medicare HMO | Source: Ambulatory Visit | Attending: Physician Assistant | Admitting: Physician Assistant

## 2020-01-15 DIAGNOSIS — R319 Hematuria, unspecified: Secondary | ICD-10-CM

## 2020-01-15 DIAGNOSIS — R1030 Lower abdominal pain, unspecified: Secondary | ICD-10-CM

## 2020-01-15 DIAGNOSIS — N3289 Other specified disorders of bladder: Secondary | ICD-10-CM | POA: Diagnosis not present

## 2020-01-15 DIAGNOSIS — R310 Gross hematuria: Secondary | ICD-10-CM | POA: Diagnosis not present

## 2020-01-15 LAB — URINE CULTURE
MICRO NUMBER:: 10254700
SPECIMEN QUALITY:: ADEQUATE

## 2020-01-15 LAB — URINALYSIS, ROUTINE W REFLEX MICROSCOPIC
Bilirubin Urine: NEGATIVE
Hyaline Cast: NONE SEEN /LPF
Ketones, ur: NEGATIVE
Nitrite: NEGATIVE
Specific Gravity, Urine: 1.012 (ref 1.001–1.03)
Squamous Epithelial / HPF: NONE SEEN /HPF (ref <=5)
pH: 7 (ref 5.0–8.0)

## 2020-01-15 LAB — CBC WITH DIFFERENTIAL/PLATELET
Absolute Monocytes: 1011 cells/uL — ABNORMAL HIGH (ref 200–950)
Basophils Absolute: 64 cells/uL (ref 0–200)
Basophils Relative: 0.5 %
Eosinophils Absolute: 269 cells/uL (ref 15–500)
Eosinophils Relative: 2.1 %
HCT: 44 % (ref 35.0–45.0)
Hemoglobin: 14.6 g/dL (ref 11.7–15.5)
Lymphs Abs: 3840 cells/uL (ref 850–3900)
MCH: 29.9 pg (ref 27.0–33.0)
MCHC: 33.2 g/dL (ref 32.0–36.0)
MCV: 90 fL (ref 80.0–100.0)
MPV: 10.7 fL (ref 7.5–12.5)
Monocytes Relative: 7.9 %
Neutro Abs: 7616 cells/uL (ref 1500–7800)
Neutrophils Relative %: 59.5 %
Platelets: 301 10*3/uL (ref 140–400)
RBC: 4.89 10*6/uL (ref 3.80–5.10)
RDW: 12.5 % (ref 11.0–15.0)
Total Lymphocyte: 30 %
WBC: 12.8 10*3/uL — ABNORMAL HIGH (ref 3.8–10.8)

## 2020-01-15 LAB — COMPLETE METABOLIC PANEL WITH GFR
AG Ratio: 1.9 (calc) (ref 1.0–2.5)
ALT: 32 U/L — ABNORMAL HIGH (ref 6–29)
AST: 23 U/L (ref 10–35)
Albumin: 4.3 g/dL (ref 3.6–5.1)
Alkaline phosphatase (APISO): 78 U/L (ref 37–153)
BUN/Creatinine Ratio: 14 (calc) (ref 6–22)
BUN: 14 mg/dL (ref 7–25)
CO2: 30 mmol/L (ref 20–32)
Calcium: 9.9 mg/dL (ref 8.6–10.4)
Chloride: 103 mmol/L (ref 98–110)
Creat: 0.99 mg/dL — ABNORMAL HIGH (ref 0.60–0.93)
GFR, Est African American: 67 mL/min/{1.73_m2} (ref >=60)
GFR, Est Non African American: 58 mL/min/{1.73_m2} — ABNORMAL LOW (ref >=60)
Globulin: 2.3 g/dL (calc) (ref 1.9–3.7)
Glucose, Bld: 89 mg/dL (ref 65–99)
Potassium: 4.3 mmol/L (ref 3.5–5.3)
Sodium: 140 mmol/L (ref 135–146)
Total Bilirubin: 0.7 mg/dL (ref 0.2–1.2)
Total Protein: 6.6 g/dL (ref 6.1–8.1)

## 2020-01-15 LAB — TSH: TSH: 4.92 mIU/L — ABNORMAL HIGH (ref 0.40–4.50)

## 2020-01-15 MED ORDER — IOPAMIDOL (ISOVUE-300) INJECTION 61%
125.0000 mL | Freq: Once | INTRAVENOUS | Status: AC | PRN
  Administered 2020-01-15: 16:00:00 125 mL via INTRAVENOUS

## 2020-01-21 ENCOUNTER — Ambulatory Visit (AMBULATORY_SURGERY_CENTER): Payer: Medicare HMO | Admitting: Gastroenterology

## 2020-01-21 ENCOUNTER — Other Ambulatory Visit: Payer: Self-pay

## 2020-01-21 ENCOUNTER — Encounter: Payer: Self-pay | Admitting: Gastroenterology

## 2020-01-21 VITALS — BP 109/66 | HR 87 | Temp 96.9°F | Resp 19 | Ht 63.0 in | Wt 220.0 lb

## 2020-01-21 DIAGNOSIS — D122 Benign neoplasm of ascending colon: Secondary | ICD-10-CM

## 2020-01-21 DIAGNOSIS — D120 Benign neoplasm of cecum: Secondary | ICD-10-CM

## 2020-01-21 DIAGNOSIS — Z1211 Encounter for screening for malignant neoplasm of colon: Secondary | ICD-10-CM | POA: Diagnosis not present

## 2020-01-21 DIAGNOSIS — Z86010 Personal history of colonic polyps: Secondary | ICD-10-CM

## 2020-01-21 DIAGNOSIS — D125 Benign neoplasm of sigmoid colon: Secondary | ICD-10-CM

## 2020-01-21 MED ORDER — SODIUM CHLORIDE 0.9 % IV SOLN: 500.0000 mL | Freq: Once | INTRAVENOUS | Status: DC

## 2020-01-21 NOTE — Progress Notes (Signed)
Pt's states no medical or surgical changes since previsit or office visit.   SH IV, LC temp and DT vitals.

## 2020-01-21 NOTE — Progress Notes (Signed)
To PACU, VSS. Report to [email or web address] 

## 2020-01-21 NOTE — Patient Instructions (Signed)
Handout on polyps & hemorrhoids given to you today   Await pathology results on polyps removed    YOU HAD AN ENDOSCOPIC PROCEDURE TODAY AT [facility]:   Refer to the procedure report that was given to you for any specific questions about what was found during the examination.  If the procedure report does not answer your questions, please call your gastroenterologist to clarify.  If you requested that your care partner not be given the details of your procedure findings, then the procedure report has been included in a sealed envelope for you to review at your convenience later.  YOU SHOULD EXPECT: Some feelings of bloating in the abdomen. Passage of more gas than usual.  Walking can help get rid of the air that was put into your GI tract during the procedure and reduce the bloating. If you had a lower endoscopy (such as a colonoscopy or flexible sigmoidoscopy) you may notice spotting of blood in your stool or on the toilet paper. If you underwent a bowel prep for your procedure, you may not have a normal bowel movement for a few days.  Please Note:  You might notice some irritation and congestion in your nose or some drainage.  This is from the oxygen used during your procedure.  There is no need for concern and it should clear up in a day or so.  SYMPTOMS TO REPORT IMMEDIATELY:   Following lower endoscopy (colonoscopy or flexible sigmoidoscopy):  Excessive amounts of blood in the stool  Significant tenderness or worsening of abdominal pains  Swelling of the abdomen that is new, acute  Fever of 100F or higher     For urgent or emergent issues, a gastroenterologist can be reached at any hour by calling [phone]. Do not use MyChart messaging for urgent concerns.    DIET:  We do recommend a small meal at first, but then you may proceed to your regular diet.  Drink plenty of fluids but you should avoid alcoholic beverages for 24 hours.  ACTIVITY:  You should  plan to take it easy for the rest of today and you should NOT DRIVE or use heavy machinery until tomorrow (because of the sedation medicines used during the test).    FOLLOW UP: Our staff will call the number listed on your records 48-72 hours following your procedure to check on you and address any questions or concerns that you may have regarding the information given to you following your procedure. If we do not reach you, we will leave a message.  We will attempt to reach you two times.  During this call, we will ask if you have developed any symptoms of COVID 19. If you develop any symptoms (ie: fever, flu-like symptoms, shortness of breath, cough etc.) before then, please call [phone].  If you test positive for Covid 19 in the 2 weeks post procedure, please call and report this information to [place].    If any biopsies were taken you will be contacted by phone or by letter within the next 1-3 weeks.  Please call us at [phone] if you have not heard about the biopsies in 3 weeks.    SIGNATURES/CONFIDENTIALITY: You and/or your care partner have signed paperwork which will be entered into your electronic medical record.  These signatures attest to the fact that that the information above on your After Visit Summary has been reviewed and is understood.  Full responsibility of the confidentiality of this discharge information lies  with you and/or your care-partner.

## 2020-01-21 NOTE — Op Note (Signed)
[facility] Patient Name: Valeri Petrick Procedure Date: 01/21/2020 10:34 AM MRN: [id number] Endoscopist: Mauri Pole , MD Age: 69 Referring MD:  Date of Birth: 21-Feb-1951 Gender: Female Account #: [id number] Procedure:                Colonoscopy Indications:              High risk colon cancer surveillance: Personal                            history of colonic polyps, High risk colon cancer                            surveillance: Personal history of multiple (3 or                            more) adenomas Medicines:                Monitored Anesthesia Care Procedure:                Pre-Anesthesia Assessment:                           - Prior to the procedure, a History and Physical                            was performed, and patient medications and                            allergies were reviewed. The patient's tolerance of                            previous anesthesia was also reviewed. The risks                            and benefits of the procedure and the sedation                            options and risks were discussed with the patient.                            All questions were answered, and informed consent                            was obtained. Prior Anticoagulants: The patient has                            taken no previous anticoagulant or antiplatelet                            agents. ASA Grade Assessment: II - A patient with                            mild systemic disease. After reviewing the risks  and benefits, the patient was deemed in                            satisfactory condition to undergo the procedure.                           After obtaining informed consent, the colonoscope                            was passed under direct vision. Throughout the                            procedure, the patient's blood pressure, pulse, and                            oxygen saturations were monitored continuously.  The                            Colonoscope was introduced through the anus and                            advanced to the the cecum, identified by                            appendiceal orifice and ileocecal valve. The                            colonoscopy was performed without difficulty. The                            patient tolerated the procedure well. The quality                            of the bowel preparation was adequate. The                            ileocecal valve, appendiceal orifice, and rectum                            were photographed. Scope In: 10:40:00 AM Scope Out: 11:07:56 AM Scope Withdrawal Time: 0 hours 18 minutes 41 seconds  Total Procedure Duration: 0 hours 27 minutes 56 seconds  Findings:                 The perianal and digital rectal examinations were                            normal.                           A less than 1 mm polyp was found in the cecum. The                            polyp was sessile. The polyp was removed with a  cold biopsy forceps. Resection and retrieval were                            complete.                           Five sessile polyps were found in the sigmoid                            colon, transverse colon and ascending colon. The                            polyps were 5 to 11 mm in size. These polyps were                            removed with a cold snare. Resection and retrieval                            were complete.                           Non-bleeding internal hemorrhoids were found during                            retroflexion. The hemorrhoids were small.                           The exam was otherwise without abnormality. Complications:            No immediate complications. Estimated Blood Loss:     Estimated blood loss was minimal. Impression:               - One less than 1 mm polyp in the cecum, removed                            with a cold biopsy forceps. Resected and  retrieved.                           - Five 5 to 11 mm polyps in the sigmoid colon, in                            the transverse colon and in the ascending colon,                            removed with a cold snare. Resected and retrieved.                           - Non-bleeding internal hemorrhoids.                           - The examination was otherwise normal. Recommendation:           - Patient has a contact number available for  emergencies. The signs and symptoms of potential                            delayed complications were discussed with the                            patient. Return to normal activities tomorrow.                            Written discharge instructions were provided to the                            patient.                           - Resume previous diet.                           - Continue present medications.                           - Await pathology results.                           - Repeat colonoscopy in 3 years for surveillance                            based on pathology results.                           - For future colonoscopy the patient will require                            an extended preparation. If there are any                            questions, please contact the gastroenterologist. Mauri Pole, MD 01/21/2020 11:13:20 AM This report has been signed electronically.

## 2020-01-22 ENCOUNTER — Encounter: Payer: Self-pay | Admitting: Gastroenterology

## 2020-01-23 ENCOUNTER — Telehealth: Payer: Self-pay | Admitting: *Deleted

## 2020-01-23 ENCOUNTER — Encounter: Payer: Self-pay | Admitting: Gastroenterology

## 2020-01-23 NOTE — Telephone Encounter (Signed)
Left message on f/u call 

## 2020-01-23 NOTE — Telephone Encounter (Signed)
1. Have you developed a fever since your procedure? no  2.   Have you had an respiratory symptoms (SOB or cough) since your procedure? no  3.   Have you tested positive for COVID 19 since your procedure no  4.   Have you had any family members/close contacts diagnosed with the COVID 19 since your procedure?  no   If yes to any of these questions please route to Joylene John, RN and Erenest Rasher, RN Follow up Call-  No flowsheet data found.   Patient questions:  Do you have a fever, pain , or abdominal swelling? No. Pain Score  0 *  Have you tolerated food without any problems? Yes.    Have you been able to return to your normal activities? Yes.    Do you have any questions about your discharge instructions: Diet   No. Medications  No. Follow up visit  No.  Do you have questions or concerns about your Care? No.  Actions: * If pain score is 4 or above: No action needed, pain <4.

## 2020-01-26 ENCOUNTER — Other Ambulatory Visit: Payer: Medicare HMO

## 2020-02-09 ENCOUNTER — Ambulatory Visit (INDEPENDENT_AMBULATORY_CARE_PROVIDER_SITE_OTHER): Payer: Medicare HMO

## 2020-02-09 DIAGNOSIS — J309 Allergic rhinitis, unspecified: Secondary | ICD-10-CM | POA: Diagnosis not present

## 2020-02-13 ENCOUNTER — Ambulatory Visit (INDEPENDENT_AMBULATORY_CARE_PROVIDER_SITE_OTHER): Payer: Medicare HMO | Admitting: Urology

## 2020-02-13 ENCOUNTER — Other Ambulatory Visit: Payer: Self-pay

## 2020-02-13 VITALS — BP 133/81 | HR 93 | Ht 63.0 in | Wt 223.0 lb

## 2020-02-13 DIAGNOSIS — R319 Hematuria, unspecified: Secondary | ICD-10-CM | POA: Diagnosis not present

## 2020-02-13 DIAGNOSIS — R310 Gross hematuria: Secondary | ICD-10-CM | POA: Diagnosis not present

## 2020-02-13 LAB — MICROSCOPIC EXAMINATION: Bacteria, UA: NONE SEEN

## 2020-02-13 LAB — URINALYSIS, COMPLETE
Bilirubin, UA: NEGATIVE
Ketones, UA: NEGATIVE
Leukocytes,UA: NEGATIVE
Nitrite, UA: NEGATIVE
Protein,UA: NEGATIVE
Specific Gravity, UA: 1.01 (ref 1.005–1.030)
Urobilinogen, Ur: 0.2 mg/dL (ref 0.2–1.0)
pH, UA: 5.5 (ref 5.0–7.5)

## 2020-02-13 NOTE — Progress Notes (Signed)
02/13/2020 1:31 PM   Grace Barry 1951/04/17 [id number]  Referring provider: Unk Pinto, MD [address] [place],  [place] [address]  Chief Complaint  Patient presents with  . Hematuria    HPI:  Grace Barry was referred over for gross hematuria.  She noticed red urine January 12, 2020. She reports lots of clots. She took ab. No dysuria. Non-smoker. No chemo or RT. No dye exposure. She underwent CT scan of the abdomen and pelvis on 01/15/2020 which was benign.  I reviewed all the images. Mild diffuse bladder wall thickening, but the bladder wasn't distended.  Symptoms have cleared. She voids with a good flow, some urgency. UA today clear.   PMH: Past Medical History:  Diagnosis Date  . Allergy   . Anxiety   . Arthritis   . Asthma   . Cataract   . Chronic kidney disease (CKD), stage II (mild) 07/01/2014   Overview:  Overview:  Due to DM, last GFR 64   . Depression   . Fibromyalgia   . GERD (gastroesophageal reflux disease)   . Hyperlipidemia   . Hypertension   . IBS (irritable bowel syndrome)   . Liver disease   . Migraine 02/19/2015  . Migraines   . Obesity   . Osteoporosis   . Sleep apnea    uses oral device  . Sleep difficulties   . Thyroid disease    hypothyroidism  . Tuberculosis    positive PPD test, chest x ray negative  . Type II or unspecified type diabetes mellitus without mention of complication, not stated as uncontrolled   . Urticaria   . Ventral hernia   . Vertigo 02/19/2015    Surgical History: Past Surgical History:  Procedure Laterality Date  . ABDOMINAL HYSTERECTOMY    . ADENOIDECTOMY    . CARPAL TUNNEL RELEASE     bilateral  . CATARACT EXTRACTION, BILATERAL    . CESAREAN SECTION     x2  . CHOLECYSTECTOMY    . COLONOSCOPY    . HERNIA REPAIR  07/24/11   ventral hernia  . JOINT REPLACEMENT     bilateral knees  . KNEE ARTHROSCOPY     bilateral  . SINOSCOPY    . TONSILLECTOMY    . TOTAL KNEE ARTHROPLASTY     bilateral     Home Medications:  Allergies as of 02/13/2020      Reactions   Floxin [ofloxacin] Hives   Viberzi [eluxadoline] Hives   Doxycycline Rash   Penicillins Rash      Medication List       Accurate as of February 13, 2020  1:31 PM. If you have any questions, ask your nurse or doctor.        aspirin 81 MG tablet Take 81 mg by mouth daily.   baclofen 10 MG tablet Commonly known as: LIORESAL Take 1 tablet (10 mg total) by mouth 2 (two) times daily as needed for muscle spasms.   CALCIUM PO Take 1,200 mg by mouth daily.   cetirizine 10 MG tablet Commonly known as: ZYRTEC Take 10 mg by mouth as needed. Reported on 10/20/2015   EpiPen 2-Pak 0.3 mg/0.3 mL Soaj injection Generic drug: EPINEPHrine Inject 0.3 mg into the muscle once.   famotidine 40 MG tablet Commonly known as: PEPCID TAKE 1 TABLET BY MOUTH TWICE A DAY. ( REPLACING ZANTAC/RANITIDINE)   Farxiga 10 MG Tabs tablet Generic drug: dapagliflozin propanediol Take 10 mg by mouth daily.   FISH OIL PO  Take by mouth daily.   fluconazole 150 MG tablet Commonly known as: Diflucan Take 1 tablet (150 mg total) by mouth daily. What changed:   when to take this  reasons to take this   glucose blood test strip Commonly known as: ONE TOUCH ULTRA TEST Check Blood sugar  daily   losartan 100 MG tablet Commonly known as: COZAAR TAKE 1 TABLET DAILY FOR BP AND KIDNEY PROTECTION   Magnesium 250 MG Tabs Take 250 mg by mouth 2 (two) times daily.   metFORMIN 500 MG 24 hr tablet Commonly known as: GLUCOPHAGE-XR Take 2 tablets 2 x /day with a meal for Diabetes   metoprolol succinate 50 MG 24 hr tablet Commonly known as: TOPROL-XL TAKE 1 TABLET (50 MG TOTAL) BY MOUTH DAILY. TAKE WITH OR IMMEDIATELY FOLLOWING A MEAL.   MULTIVITAMIN PO Take 1 tablet by mouth daily.   onetouch ultrasoft lancets Check Blood sugar  daily   PARoxetine 40 MG tablet Commonly known as: PAXIL Take 1 tablet Daily for Mood   PRESCRIPTION  MEDICATION Inject as directed every 30 (thirty) days. Allergy Shot   QC TUMERIC COMPLEX PO Take by mouth.   rosuvastatin 20 MG tablet Commonly known as: CRESTOR Take 1 tablet Daily for Cholesterol   Synthroid 200 MCG tablet Generic drug: levothyroxine Take 1 tablet daily on an empty stomach with only water for 30 minutes & no Antacid meds, Calcium or Magnesium for 4 hours & avoid Biotin   topiramate 50 MG tablet Commonly known as: TOPAMAX Take 1/2 to 1 tablet 2 x /day with Supper & Bedtime for Dieting & Weight Loss What changed: additional instructions   VITAMIN B12 SL Place 1 tablet under the tongue daily.   Vitamin D (Ergocalciferol) 1.25 MG (50000 UNIT) Caps capsule Commonly known as: DRISDOL TAKE 1 CAPSULE 4 X /WEEK FOR VIT D DEFICIENCY       Allergies:  Allergies  Allergen Reactions  . Floxin [Ofloxacin] Hives  . Viberzi [Eluxadoline] Hives  . Doxycycline Rash  . Penicillins Rash    Family History: Family History  Problem Relation Age of Onset  . Breast cancer Mother   . Hypertension Father   . Hyperlipidemia Father   . Prostate cancer Father   . Hyperlipidemia Sister   . Esophageal cancer Son 34       Died Nov 29, 2013  . Colon cancer Son   . Stomach cancer Son   . Colon polyps Neg Hx   . Diabetes Neg Hx   . Kidney disease Neg Hx   . Migraines Neg Hx   . Rectal cancer Neg Hx     Social History:  reports that she has never smoked. She has never used smokeless tobacco. She reports current alcohol use. She reports that she does not use drugs.   Physical Exam: BP 133/81   Pulse 93   Ht 5\' 3"  (1.6 m)   Wt 223 lb (101.2 kg)   BMI 39.50 kg/m   Constitutional:  Alert and oriented, No acute distress. HEENT: [place] AT, moist mucus membranes.  Trachea midline, no masses. Cardiovascular: No clubbing, cyanosis, or edema. Respiratory: Normal respiratory effort, no increased work of breathing. GI: Abdomen is soft, nontender, nondistended, no abdominal masses GU:  No CVA tenderness Skin: No rashes, bruises or suspicious lesions. Neurologic: Grossly intact, no focal deficits, moving all 4 extremities. Psychiatric: Normal mood and affect.  Laboratory Data: Lab Results  Component Value Date   WBC 12.8 (H) 01/12/2020   HGB 14.6  01/12/2020   HCT 44.0 01/12/2020   MCV 90.0 01/12/2020   PLT 301 01/12/2020    Lab Results  Component Value Date   CREATININE 0.99 (H) 01/12/2020    No results found for: PSA  No results found for: TESTOSTERONE  Lab Results  Component Value Date   HGBA1C 6.7 (H) 12/15/2019    Urinalysis    Component Value Date/Time   COLORURINE ORANGE (A) 01/12/2020 1636   APPEARANCEUR CLEAR 01/12/2020 1636   LABSPEC 1.012 01/12/2020 1636   PHURINE 7.0 01/12/2020 1636   GLUCOSEU 3+ (A) 01/12/2020 1636   HGBUR 3+ (A) 01/12/2020 1636   BILIRUBINUR NEGATIVE 08/30/2016 1617   KETONESUR NEGATIVE 01/12/2020 1636   PROTEINUR 1+ (A) 01/12/2020 1636   UROBILINOGEN 0.2 07/01/2014 1101   NITRITE NEGATIVE 01/12/2020 1636   LEUKOCYTESUR 3+ (A) 01/12/2020 1636    Lab Results  Component Value Date   BACTERIA FEW (A) 01/12/2020    Pertinent Imaging: CT images  No results found for this or any previous visit. No results found for this or any previous visit. No results found for this or any previous visit. No results found for this or any previous visit. No results found for this or any previous visit. No results found for this or any previous visit. No results found for this or any previous visit. No results found for this or any previous visit.  Assessment & Plan:    1. Hematuria, gross - I recommended she return for exam and cystoscopy.  We discussed the CT findings.  All questions answered. - Urinalysis, Complete   No follow-ups on file.  Festus Aloe, MD  [facility] [address] [place], [place] [address] [phone]

## 2020-02-13 NOTE — Patient Instructions (Signed)

## 2020-02-14 ENCOUNTER — Other Ambulatory Visit: Payer: Self-pay | Admitting: Physician Assistant

## 2020-02-14 ENCOUNTER — Encounter: Payer: Self-pay | Admitting: Physician Assistant

## 2020-02-18 ENCOUNTER — Encounter: Payer: Self-pay | Admitting: Physician Assistant

## 2020-02-18 ENCOUNTER — Ambulatory Visit (INDEPENDENT_AMBULATORY_CARE_PROVIDER_SITE_OTHER): Payer: Medicare HMO | Admitting: Physician Assistant

## 2020-02-18 ENCOUNTER — Other Ambulatory Visit: Payer: Self-pay

## 2020-02-18 DIAGNOSIS — N183 Chronic kidney disease, stage 3 unspecified: Secondary | ICD-10-CM

## 2020-02-18 DIAGNOSIS — E1122 Type 2 diabetes mellitus with diabetic chronic kidney disease: Secondary | ICD-10-CM | POA: Diagnosis not present

## 2020-02-18 MED ORDER — GLUCOSE BLOOD VI STRP: ORAL_STRIP | 3 refills | Status: DC

## 2020-02-18 MED ORDER — OZEMPIC (0.25 OR 0.5 MG/DOSE) 2 MG/1.5ML ~~LOC~~ SOPN: 0.5000 mg | PEN_INJECTOR | SUBCUTANEOUS | 3 refills | Status: DC

## 2020-02-18 NOTE — Progress Notes (Signed)
Diabetes Education and Follow-Up Visit  69 y.o.female presents for diabetic medication change.  She has Diabetes Mellitus type 2 With CKD on ARB With hyperlipidemia on statin, at goal less than 70 She was on invokana but had hematuria so was taken off. Here to discuss medications to replace with it.   Last hemoglobin A1c was: Lab Results  Component Value Date   HGBA1C 6.7 (H) 12/15/2019   HGBA1C 6.4 (H) 09/10/2019   HGBA1C 6.4 (H) 06/10/2019   BMI is Body mass index is 39.33 kg/m., she is working on diet and exercis but frustrated with her weight. Does not want a medication that will increase her weight.  Wt Readings from Last 3 Encounters:  02/18/20 222 lb (100.7 kg)  02/13/20 223 lb (101.2 kg)  01/21/20 220 lb (99.8 kg)    Pt checks her sugars 1 x day  Glucometer: accu check   Lab Results  Component Value Date   GFRNONAA 58 (L) 01/12/2020   Lab Results  Component Value Date   MICROALBUR <0.2 06/10/2019    Lab Results  Component Value Date   CHOL 142 12/15/2019   HDL 41 (L) 12/15/2019   LDLCALC 69 12/15/2019   TRIG 232 (H) 12/15/2019   CHOLHDL 3.5 12/15/2019     Problem List has Hypothyroidism; T2_NIDDM w/CKD (GFR 56 ml/min); Depression, major, recurrent, in remission ([facility]); Obstructive sleep apnea; Essential hypertension; GERD; Fatty liver; Arthritis; Morbid obesity ([place]); History of colonic polyps; Migraine; Vertigo; Vitamin D deficiency disease; Medication management; Left ear hearing loss; CKD stage 2 due to type 2 diabetes mellitus ([place]); Seasonal and perennial allergic rhinitis; Hyperlipidemia associated with type 2 diabetes mellitus ([place]); Osteopenia; and Aortic atherosclerosis ([facility]) on their problem list.  Medications Current Outpatient Medications on File Prior to Visit  Medication Sig  . aspirin 81 MG tablet Take 81 mg by mouth daily.  . baclofen (LIORESAL) 10 MG tablet Take 1 tablet (10 mg total) by mouth 2 (two) times daily as needed for muscle spasms.   Marland Kitchen CALCIUM PO Take 1,200 mg by mouth daily.  . cetirizine (ZYRTEC) 10 MG tablet Take 10 mg by mouth as needed. Reported on 10/20/2015  . Cyanocobalamin (VITAMIN B12 SL) Place 1 tablet under the tongue daily.  Marland Kitchen EPINEPHrine (EPIPEN 2-PAK) 0.3 mg/0.3 mL IJ SOAJ injection Inject 0.3 mg into the muscle once.  . famotidine (PEPCID) 40 MG tablet TAKE 1 TABLET BY MOUTH TWICE A DAY. ( REPLACING ZANTAC/RANITIDINE)  . glucose blood (ONE TOUCH ULTRA TEST) test strip Check Blood sugar  daily  . Lancets (ONETOUCH ULTRASOFT) lancets Check Blood sugar  daily  . losartan (COZAAR) 100 MG tablet TAKE 1 TABLET DAILY FOR BP AND KIDNEY PROTECTION  . Magnesium 250 MG TABS Take 250 mg by mouth 2 (two) times daily.  . metFORMIN (GLUCOPHAGE-XR) 500 MG 24 hr tablet Take 2 tablets 2 x /day with a meal for Diabetes  . metoprolol succinate (TOPROL-XL) 50 MG 24 hr tablet TAKE 1 TABLET (50 MG TOTAL) BY MOUTH DAILY. TAKE WITH OR IMMEDIATELY FOLLOWING A MEAL.  . Multiple Vitamins-Minerals (MULTIVITAMIN PO) Take 1 tablet by mouth daily.   . Omega-3 Fatty Acids (FISH OIL PO) Take by mouth daily.  Marland Kitchen PARoxetine (PAXIL) 40 MG tablet Take 1 tablet Daily for Mood  . PRESCRIPTION MEDICATION Inject as directed every 30 (thirty) days. Allergy Shot  . rosuvastatin (CRESTOR) 20 MG tablet Take 1 tablet Daily for Cholesterol  . SYNTHROID 200 MCG tablet Take 1 tablet daily on an  empty stomach with only water for 30 minutes & no Antacid meds, Calcium or Magnesium for 4 hours & avoid Biotin  . topiramate (TOPAMAX) 50 MG tablet Take 1/2 to 1 tablet 2 x /day with Supper & Bedtime for Dieting & Weight Loss (Patient taking differently: Take 1/2 to 1 tablet daily)  . Turmeric (QC TUMERIC COMPLEX PO) Take by mouth.  . Vitamin D, Ergocalciferol, (DRISDOL) 1.25 MG (50000 UNIT) CAPS capsule TAKE 1 CAPSULE 4 X /WEEK FOR VIT D DEFICIENCY   No current facility-administered medications on file prior to visit.    ROS- see HPI  Physical Exam: Blood  pressure 122/88, pulse 100, temperature (!) 97.1 F (36.2 C), weight 222 lb (100.7 kg), SpO2 97 %. Body mass index is 39.33 kg/m. General Appearance: Well nourished, in no apparent distress. Eyes: PERRLA, EOMs, conjunctiva no swelling or erythema ENT/Mouth: Ext aud canals clear, TMs without erythema, bulging. No erythema, swelling, or exudate on post pharynx.  Tonsils not swollen or erythematous. Hearing normal.  Respiratory: Respiratory effort normal, BS equal bilaterally without rales, rhonchi, wheezing or stridor.  Cardio: RRR with no MRGs. Brisk peripheral pulses without edema.  Abdomen: Soft, + BS.  Non tender, no guarding, rebound, hernias, masses. Musculoskeletal: Full ROM, 5/5 strength, normal gait.  Skin: Warm, dry without rashes, lesions, ecchymosis.  Neuro: Cranial nerves intact. Normal muscle tone, no cerebellar symptoms. Sensation intact.    Plan and Assessment: Diabetes Education: Reviewed 'ABCs' of diabetes management (respective goals in parentheses):  A1C (<7), blood pressure (<130/80), and cholesterol (LDL <70) Intoleratant to SGLT2 due to hematuria, could potentially try others in the class however will avoid at this time ozempic 0.5mg  q weekly for weight loss and sugar control Follow up in may for 3 month recheck  Eye Exam yearly and Dental Exam every 6 months. Dietary recommendations Physical Activity recommendations - Strongly advised her to start checking sugars at different times of the day - check 2 times a day, rotating checks - given sugar log and advised how to fill it and to bring it at next appt  - given foot care handout and explained the principles  - given instructions for hypoglycemia management    Future Appointments  Date Time Provider [place]  02/27/2020  3:30 PM Festus Aloe, MD [facility] None  06/14/2020  2:00 PM Vicie Mutters, PA-C [facility] None  07/28/2020 11:15 AM Suzzanne Cloud, NP [facility] None  11/16/2020  2:30 PM Valentina Shaggy, MD [facility] None  12/22/2020  3:00 PM Liane Comber, NP [facility] None

## 2020-02-18 NOTE — Patient Instructions (Signed)
  Start ozempic injection as shown once a week. The starting dose is 0.25 mg on the pen for the first 2-4 weeks.  You may inject in the stomach, thigh or arm. You may experience nausea in the first few days which usually goes away.   You will feel fullness of the stomach with starting the medication and should try to keep the portions at meals small.  After 4 weeks increase the dose to 0.5mg  daily if no nausea present.    If any questions or concerns are present call the office  After another month we can try to increase to 1 mg but people often experience nausea with this.    Check out  Mini habits for weight loss book  2 free apps for tracking food is myfitness pal  loseit  If you want more structured weight loss that you have to pay for, you can look into  Noom  weight watchers  General eating tips  What to Avoid . Avoid added sugars o Often added sugar can be found in processed foods such as many condiments, dry cereals, cakes, cookies, chips, crisps, crackers, candies, sweetened drinks, etc.  o Read labels and AVOID/DECREASE use of foods with the following in their ingredient list: Sugar, fructose, high fructose corn syrup, sucrose, glucose, maltose, dextrose, molasses, cane sugar, brown sugar, any type of syrup, agave nectar, etc.   . Avoid snacking in between meals- drink water or if you feel you need a snack, pick a high water content snack such as cucumbers, watermelon, or any veggie.  Marland Kitchen Avoid foods made with flour o If you are going to eat food made with flour, choose those made with whole-grains; and, minimize your consumption as much as is tolerable . Avoid processed foods o These foods are generally stocked in the middle of the grocery store.  o Focus on shopping on the perimeter of the grocery.  What to Include . Vegetables o GREEN LEAFY VEGETABLES: Kale, spinach, mustard greens, collard greens, cabbage, broccoli, etc. o OTHER: Asparagus, cauliflower, eggplant, carrots,  peas, Brussel sprouts, tomatoes, bell peppers, zucchini, beets, cucumbers, etc. . Grains, seeds, and legumes o Beans: kidney beans, black eyed peas, garbanzo beans, black beans, pinto beans, etc. o Whole, unrefined grains: brown rice, barley, bulgur, oatmeal, etc. . Healthy fats  o Avoid highly processed fats such as vegetable oil o Examples of healthy fats: avocado, olives, virgin olive oil, dark chocolate (?72% Cocoa), nuts (peanuts, almonds, walnuts, cashews, pecans, etc.) o Please still do small amount of these healthy fats, they are dense in calories.  . Low - Moderate Intake of Animal Sources of Protein o Meat sources: chicken, [place], salmon, tuna. Limit to 4 ounces of meat at one time or the size of your palm. o Consider limiting dairy sources, but when choosing dairy focus on: PLAIN [place] yogurt, cottage cheese, high-protein milk . Fruit o Choose berries

## 2020-02-27 ENCOUNTER — Other Ambulatory Visit: Payer: Self-pay

## 2020-02-27 ENCOUNTER — Encounter: Payer: Self-pay | Admitting: Urology

## 2020-02-27 ENCOUNTER — Ambulatory Visit (INDEPENDENT_AMBULATORY_CARE_PROVIDER_SITE_OTHER): Payer: Medicare HMO | Admitting: Urology

## 2020-02-27 VITALS — BP 114/73 | HR 89 | Ht 63.0 in | Wt 223.0 lb

## 2020-02-27 DIAGNOSIS — R310 Gross hematuria: Secondary | ICD-10-CM

## 2020-02-27 MED ORDER — LIDOCAINE HCL URETHRAL/MUCOSAL 2 % EX GEL
1.0000 "application " | Freq: Once | CUTANEOUS | Status: AC
  Administered 2020-02-27: 1 via URETHRAL

## 2020-02-27 NOTE — Progress Notes (Signed)
   02/27/20  CC:  Chief Complaint  Patient presents with  . Cysto    HPI:  F/u - gross hematuria - She noticed red urine January 12, 2020. She reports lots of clots. She took abx. No dysuria. Non-smoker. No chemo or RT. No dye exposure. She underwent CT scan of the abdomen and pelvis on 01/15/2020 which was benign.  I reviewed all the images. Mild diffuse bladder wall thickening, but the bladder wasn't distended.  Symptoms have cleared. She voids with a good flow, some urgency. F/u UA at GU normal.    She is well today and here for cystoscopy.  No gross hematuria or dysuria since I last saw her.  Blood pressure 114/73, pulse 89, height 5\' 3"  (1.6 m), weight 223 lb (101.2 kg). NED. A&Ox3.   No respiratory distress   Abd soft, NT, ND Normal external genitalia with patent urethral meatus  Cystoscopy Procedure Note  Patient identification was confirmed, informed consent was obtained, and patient was prepped using Betadine solution.  Lidocaine jelly was administered per urethral meatus.    Procedure: - Flexible cystoscope introduced, without any difficulty.   - Thorough search of the bladder revealed:    normal urethral meatus    normal urothelium    no stones    no ulcers     no tumors    no urethral polyps    no trabeculation  - Ureteral orifices were normal in position and appearance.  Post-Procedure: - Patient tolerated the procedure well -Chaperone: Oki for exam and cystoscopy  Assessment/ Plan:  Gross hematuria-benign evaluation.  I will see her back in 6 months to check the urine and if clear she could likely follow-up as needed.  She will let us know if she has any changes or sees any new hematuria or bladder symptoms.   No follow-ups on file.  Festus Aloe, MD

## 2020-02-27 NOTE — Patient Instructions (Signed)

## 2020-03-01 LAB — MICROSCOPIC EXAMINATION: Bacteria, UA: NONE SEEN

## 2020-03-01 LAB — URINALYSIS, COMPLETE
Bilirubin, UA: NEGATIVE
Glucose, UA: NEGATIVE
Ketones, UA: NEGATIVE
Leukocytes,UA: NEGATIVE
Nitrite, UA: NEGATIVE
Protein,UA: NEGATIVE
Specific Gravity, UA: 1.01 (ref 1.005–1.030)
Urobilinogen, Ur: 0.2 mg/dL (ref 0.2–1.0)
pH, UA: 6 (ref 5.0–7.5)

## 2020-03-22 NOTE — Progress Notes (Signed)
FOLLOW UP  Assessment:   Atherosclerosis of aorta Per CT 07/2019 Control blood pressure, cholesterol, glucose, increase exercise.    Essential hypertension - continue medications, DASH diet, exercise and monitor at home. Call if greater than 130/80.  - CBC with Differential/Platelet - CMP/GFR - TSH  Diabetes mellitus due to underlying condition with diabetic chronic kidney disease Will see if we can back off to 0.25mg  for ozempic for 4 weeks and then increase to the 0.5 to help decrease symptoms May be able to cut back on metofrmin as well pending A1C If this does not help can try trulicity samples - Hemoglobin A1c  CKD stage 2 associated with T2DM (HCC) Increase fluids, avoid NSAIDS, monitor sugars, will monitor - CMP WITH GFR  Hyperlipidemia associated with T2DM ([place])  -continue medications, check lipids, decrease fatty foods, increase activity.  - Lipid panel  Medication management - Magnesium  Depression, major, recurrent, in remission ([place]) In remission on medications; continue paxil - ? Need to change for weight loss  Lifestyle discussed: diet/exerise, sleep hygiene, stress management, hydration  Morbid obesity (HCC) - increase veggies, decrease carbs - long discussion about weight loss, diet, and exercise Continue ozempic for now - phentermine d/c'd due to lack of benefit - patient admits to emotional eating; strategies discussed, get chocolate out of the house, get husband on board, have plan for when cravings hit, try protein heavy snacks, information provided  Hypothyroidism continue medications the same pending lab results reminded to take on an empty stomach 30-31mins before food.  check TSH level  Over 30 minutes of exam, counseling, chart review, and critical decision making was performed  Future Appointments  Date Time Provider [place]  06/14/2020  2:00 PM Vicie Mutters, PA-C [facility] None  07/28/2020 11:15 AM Suzzanne Cloud, NP [facility]  None  10/08/2020 11:00 AM Festus Aloe, MD [facility] None  11/16/2020  2:30 PM Valentina Shaggy, MD [facility] None  12/22/2020  3:00 PM Liane Comber, NP [facility] None     Subjective:   Grace Barry is a 69 y.o. female who presents for  3 month follow up on hypertension, T2 diabetes with CKD, hyperlipidemia, vitamin D def.   She has left lateral arm pain, better in the AM, worse later in the day, will radiate around her arm. Had pfizer left arm late Jan and started a few weeks after that. She can point directly to the pin point tender spot. No swelling. She has seen Dr. Fredna Dow in the past. Normal CXR 2020  BMI is Body mass index is 38.09 kg/m., she has not been working on diet and exercise. She eats 1-2 meals a day. She emotionally and bored eat. She drink 120 oz of water a day, with crystal lite very diluted. She has not been working out since pandemic and knee pain, plans to restart water aerobics once able.  Wt Readings from Last 3 Encounters:  03/24/20 215 lb (97.5 kg)  02/27/20 223 lb (101.2 kg)  02/18/20 222 lb (100.7 kg)    She had CT coronary 07/2019 -CAD-RADS 1. Minimal non-obstructive CAD (0-24%) but did show aortic atherosclerosis Her blood pressure has been controlled at home,  Metoprolol 50mg  XL for palpitations, losartan  100 mg,BP: 122/80  She does workout. She denies chest pain, shortness of breath, dizziness.   She has been working on diet and exercise for Diabetes With CKD on ARB With hyperlipidemia on statin, crestor 20mg , at goal less than 70 She was on invokana but had  hematuria so was taken off, has had benign exam with Dr. Junious Silk  She was started on ozempic 0.5mg  last visit- she did 2 weeks of the 0.25mg  and then went to the 0.5 and she has had worsening reflux, gas, and AB issues. She has lost weight but she is not tolerating it well.   and denies paresthesia of the feet, polydipsia, polyuria and visual disturbances.  Last A1C was:  Lab Results   Component Value Date   HGBA1C 6.7 (H) 12/15/2019    She has CKD IIIa associated with T2DM monitored at this office:  Lab Results  Component Value Date   GFRNONAA 58 (L) 01/12/2020   Lab Results  Component Value Date   CHOL 142 12/15/2019   HDL 41 (L) 12/15/2019   LDLCALC 69 12/15/2019   TRIG 232 (H) 12/15/2019   CHOLHDL 3.5 12/15/2019   Patient is on Vitamin D supplement and at goal at recent check:  Lab Results  Component Value Date   VD25OH 87 09/10/2019   She is on thyroid medication. Her medication was not changed last visit, one pill daily, not on a biotin.  Lab Results  Component Value Date   TSH 4.92 (H) 01/12/2020      Medication Review  Current Outpatient Medications (Endocrine & Metabolic):  .  metFORMIN (GLUCOPHAGE-XR) 500 MG 24 hr tablet, Take 2 tablets 2 x /day with a meal for Diabetes .  Semaglutide,0.25 or 0.5MG /DOS, (OZEMPIC, 0.25 OR 0.5 MG/DOSE,) 2 MG/1.5ML SOPN, Inject 0.5 mg into the skin once a week. Marland Kitchen  SYNTHROID 200 MCG tablet, Take 1 tablet daily on an empty stomach with only water for 30 minutes & no Antacid meds, Calcium or Magnesium for 4 hours & avoid Biotin  Current Outpatient Medications (Cardiovascular):  Marland Kitchen  EPINEPHrine (EPIPEN 2-PAK) 0.3 mg/0.3 mL IJ SOAJ injection, Inject 0.3 mg into the muscle once. Marland Kitchen  losartan (COZAAR) 100 MG tablet, TAKE 1 TABLET DAILY FOR BP AND KIDNEY PROTECTION .  metoprolol succinate (TOPROL-XL) 50 MG 24 hr tablet, TAKE 1 TABLET (50 MG TOTAL) BY MOUTH DAILY. TAKE WITH OR IMMEDIATELY FOLLOWING A MEAL. .  rosuvastatin (CRESTOR) 20 MG tablet, Take 1 tablet Daily for Cholesterol  Current Outpatient Medications (Respiratory):  .  cetirizine (ZYRTEC) 10 MG tablet, Take 10 mg by mouth as needed. Reported on 10/20/2015  Current Outpatient Medications (Analgesics):  .  aspirin 81 MG tablet, Take 81 mg by mouth daily.  Current Outpatient Medications (Hematological):  Marland Kitchen  Cyanocobalamin (VITAMIN B12 SL), Place 1 tablet  under the tongue daily.  Current Outpatient Medications (Other):  .  baclofen (LIORESAL) 10 MG tablet, Take 1 tablet (10 mg total) by mouth 2 (two) times daily as needed for muscle spasms. Marland Kitchen  CALCIUM PO, Take 1,200 mg by mouth daily. .  famotidine (PEPCID) 40 MG tablet, TAKE 1 TABLET BY MOUTH TWICE A DAY. ( REPLACING ZANTAC/RANITIDINE) .  glucose blood (ONE TOUCH ULTRA TEST) test strip, Check Blood sugar Daily for diabetes .  Lancets (ONETOUCH ULTRASOFT) lancets, Check Blood sugar  daily .  Magnesium 250 MG TABS, Take 250 mg by mouth 2 (two) times daily. .  Multiple Vitamins-Minerals (MULTIVITAMIN PO), Take 1 tablet by mouth daily.  .  Omega-3 Fatty Acids (FISH OIL PO), Take by mouth daily. Marland Kitchen  PARoxetine (PAXIL) 40 MG tablet, Take 1 tablet Daily for Mood .  PRESCRIPTION MEDICATION, Inject as directed every 30 (thirty) days. Allergy Shot .  topiramate (TOPAMAX) 50 MG tablet, Take 1/2 to 1  tablet 2 x /day with Supper & Bedtime for Dieting & Weight Loss (Patient taking differently: Take 1/2 to 1 tablet daily) .  Turmeric (QC TUMERIC COMPLEX PO), Take by mouth. .  Vitamin D, Ergocalciferol, (DRISDOL) 1.25 MG (50000 UNIT) CAPS capsule, TAKE 1 CAPSULE 4 X /WEEK FOR VIT D DEFICIENCY  Current Problems (verified) Patient Active Problem List   Diagnosis Date Noted  . Aortic atherosclerosis ([place]) 12/12/2019  . Degenerative tear of triangular fibrocartilage complex (TFCC) of left wrist 01/17/2019  . Pain 01/17/2019  . Hyperlipidemia associated with type 2 diabetes mellitus ([place]) 11/07/2018  . Osteopenia 11/07/2018  . Seasonal and perennial allergic rhinitis 07/17/2018  . Sensorineural hearing loss (SNHL) of left ear with unrestricted hearing of right ear 12/31/2017  . Bilateral impacted cerumen 05/31/2016  . Dysfunction of left eustachian tube 05/31/2016  . Left ear hearing loss 03/28/2016  . Vitamin D deficiency disease 05/19/2015  . Medication management 05/19/2015  . Migraine 02/19/2015  .  Vertigo 02/19/2015  . History of colonic polyps 01/15/2015  . Diarrhea 09/01/2014  . Morbid obesity ([place]) 07/01/2014  . CKD stage 2 due to type 2 diabetes mellitus ([place]) 07/01/2014  . Fatty liver   . Arthritis   . Hypothyroidism 12/15/2010  . T2_NIDDM w/CKD (GFR 56 ml/min) 12/15/2010  . Depression, major, recurrent, in remission ([place]) 12/15/2010  . Obstructive sleep apnea 12/15/2010  . Essential hypertension 12/15/2010  . GERD 12/15/2010    Allergies Allergies  Allergen Reactions  . Floxin [Ofloxacin] Hives  . Invokana [Canagliflozin]     Hematuria- avoid class  . Viberzi [Eluxadoline] Hives  . Doxycycline Rash  . Penicillins Rash    Surgical History: reviewed and unchanged Family History: reviewed and unchanged Social History: reviewed and unchanged  Review of Systems:  Review of Systems  Constitutional: Negative.   HENT: Negative for congestion, ear discharge, ear pain, hearing loss, nosebleeds, sore throat and tinnitus.   Eyes: Negative.   Respiratory: Negative.  Negative for stridor.   Cardiovascular: Negative.   Gastrointestinal: Negative for abdominal pain, blood in stool, constipation, diarrhea, heartburn, melena, nausea and vomiting.  Genitourinary: Negative.   Musculoskeletal: Positive for joint pain. Negative for back pain, falls, myalgias and neck pain.  Skin: Negative.   Neurological: Positive for headaches. Negative for dizziness, tingling, tremors, sensory change, speech change, focal weakness, seizures and loss of consciousness.  Endo/Heme/Allergies: Negative.   Psychiatric/Behavioral: Negative for depression, substance abuse and suicidal ideas. The patient is not nervous/anxious and does not have insomnia.      Objective:   Today's Vitals   03/24/20 1040  BP: 122/80  Pulse: 94  Temp: 98.9 F (37.2 C)  SpO2: 96%  Weight: 215 lb (97.5 kg)   General appearance: alert, no distress, WD/WN,  female HEENT: normocephalic, sclerae anicteric, bil soft  wax obstructing ext canals, TMs not visualized, nares patent, no discharge or erythema, pharynx normal Oral cavity: MMM, no lesions Neck: supple, no lymphadenopathy, no thyromegaly, no masses Heart: RRR, normal S1, S2, no murmurs Lungs: CTA bilaterally, no wheezes, rhonchi, or rales Abdomen: +bs, soft, non tender, non distended, no masses, no hepatomegaly, no splenomegaly Musculoskeletal: left shoulder with some suprafossa swelling, no lymphnodes, she has + empty can test, + subacromial bursa tenderness, pain with internal, external rotation, full ROM of abduction and adduction, normal distal neurovascular exam and strength.  nontender, no swelling, no obvious deformity Extremities: no edema, no cyanosis, no clubbing Pulses: 2+ symmetric, upper and lower extremities, normal cap refill Neurological: alert,  oriented x 3, CN2-12 intact, strength normal upper extremities and lower extremities, sensation normal throughout, DTRs 2+ throughout, no cerebellar signs, gait normal Psychiatric: normal affect, behavior normal, pleasant    Vicie Mutters, PA-C   03/24/2020

## 2020-03-24 ENCOUNTER — Encounter: Payer: Self-pay | Admitting: Physician Assistant

## 2020-03-24 ENCOUNTER — Ambulatory Visit (INDEPENDENT_AMBULATORY_CARE_PROVIDER_SITE_OTHER): Payer: Medicare HMO

## 2020-03-24 ENCOUNTER — Other Ambulatory Visit: Payer: Self-pay | Admitting: Urology

## 2020-03-24 ENCOUNTER — Ambulatory Visit (INDEPENDENT_AMBULATORY_CARE_PROVIDER_SITE_OTHER): Payer: Medicare HMO | Admitting: Physician Assistant

## 2020-03-24 ENCOUNTER — Other Ambulatory Visit: Payer: Self-pay

## 2020-03-24 VITALS — BP 122/80 | HR 94 | Temp 98.9°F | Wt 215.0 lb

## 2020-03-24 DIAGNOSIS — E1169 Type 2 diabetes mellitus with other specified complication: Secondary | ICD-10-CM

## 2020-03-24 DIAGNOSIS — I10 Essential (primary) hypertension: Secondary | ICD-10-CM | POA: Diagnosis not present

## 2020-03-24 DIAGNOSIS — F3340 Major depressive disorder, recurrent, in remission, unspecified: Secondary | ICD-10-CM

## 2020-03-24 DIAGNOSIS — E785 Hyperlipidemia, unspecified: Secondary | ICD-10-CM

## 2020-03-24 DIAGNOSIS — Z79899 Other long term (current) drug therapy: Secondary | ICD-10-CM | POA: Diagnosis not present

## 2020-03-24 DIAGNOSIS — J309 Allergic rhinitis, unspecified: Secondary | ICD-10-CM

## 2020-03-24 DIAGNOSIS — E1129 Type 2 diabetes mellitus with other diabetic kidney complication: Secondary | ICD-10-CM | POA: Diagnosis not present

## 2020-03-24 DIAGNOSIS — E1122 Type 2 diabetes mellitus with diabetic chronic kidney disease: Secondary | ICD-10-CM

## 2020-03-24 DIAGNOSIS — I700 Atherosclerosis of aorta: Secondary | ICD-10-CM

## 2020-03-24 DIAGNOSIS — R69 Illness, unspecified: Secondary | ICD-10-CM | POA: Diagnosis not present

## 2020-03-24 DIAGNOSIS — N182 Chronic kidney disease, stage 2 (mild): Secondary | ICD-10-CM | POA: Diagnosis not present

## 2020-03-24 DIAGNOSIS — E039 Hypothyroidism, unspecified: Secondary | ICD-10-CM | POA: Diagnosis not present

## 2020-03-24 NOTE — Patient Instructions (Addendum)
Go back to the ozempic 0.25mg  weekly for 4 weeks Then increase to the 0.5mg  weekly if you are still having issues with the nausea then we can try trulicity samples/card that give you a month free  We can also potentially keep you on your ozempic 0.5 BUT cut back to 2 in the AM and 1 or none at night to help with gERD  Can do pepcid BID  Can add on tums or mylanta  Follow up with Dr. Levell July office for your left shoulder I think you need injection.    General eating tips  What to Avoid . Avoid added sugars o Often added sugar can be found in processed foods such as many condiments, dry cereals, cakes, cookies, chips, crisps, crackers, candies, sweetened drinks, etc.  o Read labels and AVOID/DECREASE use of foods with the following in their ingredient list: Sugar, fructose, high fructose corn syrup, sucrose, glucose, maltose, dextrose, molasses, cane sugar, brown sugar, any type of syrup, agave nectar, etc.   . Avoid snacking in between meals- drink water or if you feel you need a snack, pick a high water content snack such as cucumbers, watermelon, or any veggie.  Marland Kitchen Avoid foods made with flour o If you are going to eat food made with flour, choose those made with whole-grains; and, minimize your consumption as much as is tolerable . Avoid processed foods o These foods are generally stocked in the middle of the grocery store.  o Focus on shopping on the perimeter of the grocery.  What to Include . Vegetables o GREEN LEAFY VEGETABLES: Kale, spinach, mustard greens, collard greens, cabbage, broccoli, etc. o OTHER: Asparagus, cauliflower, eggplant, carrots, peas, Brussel sprouts, tomatoes, bell peppers, zucchini, beets, cucumbers, etc. . Grains, seeds, and legumes o Beans: kidney beans, black eyed peas, garbanzo beans, black beans, pinto beans, etc. o Whole, unrefined grains: brown rice, barley, bulgur, oatmeal, etc. . Healthy fats  o Avoid highly processed fats such as vegetable  oil o Examples of healthy fats: avocado, olives, virgin olive oil, dark chocolate (?72% Cocoa), nuts (peanuts, almonds, walnuts, cashews, pecans, etc.) o Please still do small amount of these healthy fats, they are dense in calories.  . Low - Moderate Intake of Animal Sources of Protein o Meat sources: chicken, [place], salmon, tuna. Limit to 4 ounces of meat at one time or the size of your palm. o Consider limiting dairy sources, but when choosing dairy focus on: PLAIN [place] yogurt, cottage cheese, high-protein milk . Fruit o Choose berries   Shoulder Impingement Syndrome Rehab Ask your health care provider which exercises are safe for you. Do exercises exactly as told by your health care provider and adjust them as directed. It is normal to feel mild stretching, pulling, tightness, or discomfort as you do these exercises. Stop right away if you feel sudden pain or your pain gets worse. Do not begin these exercises until told by your health care provider. Stretching and range-of-motion exercise This exercise warms up your muscles and joints and improves the movement and flexibility of your shoulder. This exercise also helps to relieve pain and stiffness. Passive horizontal adduction In passive adduction, you use your other hand to move the injured arm toward your body. The injured arm does not move on its own. In this movement, your arm is moved across your body in the horizontal plane (horizontal adduction). 1. Sit or stand and pull your left / right elbow across your chest, toward your other shoulder. Stop when you  feel a gentle stretch in the back of your shoulder and upper arm. ? Keep your arm at shoulder height. ? Keep your arm as close to your body as you comfortably can. 2. Hold for __________ seconds. 3. Slowly return to the starting position. Repeat __________ times. Complete this exercise __________ times a day. Strengthening exercises These exercises build strength and endurance in  your shoulder. Endurance is the ability to use your muscles for a long time, even after they get tired. External rotation, isometric This is an exercise in which you press the back of your wrist against a door frame without moving your shoulder joint (isometric). 1. Stand or sit in a doorway, facing the door frame. 2. Bend your left / right elbow and place the back of your wrist against the door frame. Only the back of your wrist should be touching the frame. Keep your upper arm at your side. 3. Gently press your wrist against the door frame, as if you are trying to push your arm away from your abdomen (external rotation). Press as hard as you are able without pain. ? Avoid shrugging your shoulder while you press your wrist against the door frame. Keep your shoulder blade tucked down toward the middle of your back. 4. Hold for __________ seconds. 5. Slowly release the tension, and relax your muscles completely before you repeat the exercise. Repeat __________ times. Complete this exercise __________ times a day. Internal rotation, isometric This is an exercise in which you press your palm against a door frame without moving your shoulder joint (isometric). 1. Stand or sit in a doorway, facing the door frame. 2. Bend your left / right elbow and place the palm of your hand against the door frame. Only your palm should be touching the frame. Keep your upper arm at your side. 3. Gently press your hand against the door frame, as if you are trying to push your arm toward your abdomen (internal rotation). Press as hard as you are able without pain. ? Avoid shrugging your shoulder while you press your hand against the door frame. Keep your shoulder blade tucked down toward the middle of your back. 4. Hold for __________ seconds. 5. Slowly release the tension, and relax your muscles completely before you repeat the exercise. Repeat __________ times. Complete this exercise __________ times a day. Scapular  protraction, supine  1. Lie on your back on a firm surface (supine position). Hold a __________ weight in your left / right hand. 2. Raise your left / right arm straight into the air so your hand is directly above your shoulder joint. 3. Push the weight into the air so your shoulder (scapula) lifts off the surface that you are lying on. The scapula will push up or forward (protraction). Do not move your head, neck, or back. 4. Hold for __________ seconds. 5. Slowly return to the starting position. Let your muscles relax completely before you repeat this exercise. Repeat __________ times. Complete this exercise __________ times a day. Scapular retraction  1. Sit in a stable chair without armrests, or stand up. 2. Secure an exercise band to a stable object in front of you so the band is at shoulder height. 3. Hold one end of the exercise band in each hand. Your palms should face down. 4. Squeeze your shoulder blades together (retraction) and move your elbows slightly behind you. Do not shrug your shoulders upward while you do this. 5. Hold for __________ seconds. 6. Slowly return to the starting  position. Repeat __________ times. Complete this exercise __________ times a day. Shoulder extension  1. Sit in a stable chair without armrests, or stand up. 2. Secure an exercise band to a stable object in front of you so the band is above shoulder height. 3. Hold one end of the exercise band in each hand. 4. Straighten your elbows and lift your hands up to shoulder height. 5. Squeeze your shoulder blades together and pull your hands down to the sides of your thighs (extension). Stop when your hands are straight down by your sides. Do not let your hands go behind your body. 6. Hold for __________ seconds. 7. Slowly return to the starting position. Repeat __________ times. Complete this exercise __________ times a day. This information is not intended to replace advice given to you by your health  care provider. Make sure you discuss any questions you have with your health care provider. Document Revised: 02/07/2019 Document Reviewed: 11/11/2018 Elsevier Patient Education  [place].

## 2020-03-25 LAB — CBC WITH DIFFERENTIAL/PLATELET
Absolute Monocytes: 702 cells/uL (ref 200–950)
Basophils Absolute: 131 cells/uL (ref 0–200)
Basophils Relative: 1.1 %
Eosinophils Absolute: 500 cells/uL (ref 15–500)
Eosinophils Relative: 4.2 %
HCT: 46.9 % — ABNORMAL HIGH (ref 35.0–45.0)
Hemoglobin: 15.4 g/dL (ref 11.7–15.5)
Lymphs Abs: 3594 cells/uL (ref 850–3900)
MCH: 29.8 pg (ref 27.0–33.0)
MCHC: 32.8 g/dL (ref 32.0–36.0)
MCV: 90.7 fL (ref 80.0–100.0)
MPV: 10.9 fL (ref 7.5–12.5)
Monocytes Relative: 5.9 %
Neutro Abs: 6973 cells/uL (ref 1500–7800)
Neutrophils Relative %: 58.6 %
Platelets: 296 10*3/uL (ref 140–400)
RBC: 5.17 10*6/uL — ABNORMAL HIGH (ref 3.80–5.10)
RDW: 13 % (ref 11.0–15.0)
Total Lymphocyte: 30.2 %
WBC: 11.9 10*3/uL — ABNORMAL HIGH (ref 3.8–10.8)

## 2020-03-25 LAB — COMPLETE METABOLIC PANEL WITH GFR
AG Ratio: 1.9 (calc) (ref 1.0–2.5)
ALT: 36 U/L — ABNORMAL HIGH (ref 6–29)
AST: 28 U/L (ref 10–35)
Albumin: 4.6 g/dL (ref 3.6–5.1)
Alkaline phosphatase (APISO): 83 U/L (ref 37–153)
BUN/Creatinine Ratio: 11 (calc) (ref 6–22)
BUN: 12 mg/dL (ref 7–25)
CO2: 26 mmol/L (ref 20–32)
Calcium: 9.7 mg/dL (ref 8.6–10.4)
Chloride: 100 mmol/L (ref 98–110)
Creat: 1.11 mg/dL — ABNORMAL HIGH (ref 0.60–0.93)
GFR, Est African American: 58 mL/min/{1.73_m2} — ABNORMAL LOW (ref >=60)
GFR, Est Non African American: 50 mL/min/{1.73_m2} — ABNORMAL LOW (ref >=60)
Globulin: 2.4 g/dL (calc) (ref 1.9–3.7)
Glucose, Bld: 111 mg/dL — ABNORMAL HIGH (ref 65–99)
Potassium: 3.9 mmol/L (ref 3.5–5.3)
Sodium: 139 mmol/L (ref 135–146)
Total Bilirubin: 0.6 mg/dL (ref 0.2–1.2)
Total Protein: 7 g/dL (ref 6.1–8.1)

## 2020-03-25 LAB — LIPID PANEL
Cholesterol: 92 mg/dL (ref <200)
HDL: 37 mg/dL — ABNORMAL LOW (ref >=50)
LDL Cholesterol (Calc): 29 mg/dL (calc)
Non-HDL Cholesterol (Calc): 55 mg/dL (calc) (ref <130)
Total CHOL/HDL Ratio: 2.5 (calc) (ref <5.0)
Triglycerides: 192 mg/dL — ABNORMAL HIGH (ref <150)

## 2020-03-25 LAB — HEMOGLOBIN A1C
Hgb A1c MFr Bld: 6.2 % of total Hgb — ABNORMAL HIGH (ref <5.7)
Mean Plasma Glucose: 131 (calc)
eAG (mmol/L): 7.3 (calc)

## 2020-03-25 LAB — TSH: TSH: 2.41 mIU/L (ref 0.40–4.50)

## 2020-04-14 DIAGNOSIS — G8929 Other chronic pain: Secondary | ICD-10-CM | POA: Diagnosis not present

## 2020-04-14 DIAGNOSIS — R52 Pain, unspecified: Secondary | ICD-10-CM | POA: Diagnosis not present

## 2020-04-14 DIAGNOSIS — M25512 Pain in left shoulder: Secondary | ICD-10-CM | POA: Diagnosis not present

## 2020-04-15 ENCOUNTER — Telehealth: Payer: Self-pay

## 2020-04-15 NOTE — Telephone Encounter (Signed)
A representative of [employer] called regarding patient's medications. States that she refused to talk with her and wanted you to know that she hasn't filled any prescriptions in quite sometime. Just wanted [place] to be aware of this.

## 2020-04-21 ENCOUNTER — Ambulatory Visit (INDEPENDENT_AMBULATORY_CARE_PROVIDER_SITE_OTHER): Payer: Medicare HMO

## 2020-04-21 DIAGNOSIS — J309 Allergic rhinitis, unspecified: Secondary | ICD-10-CM

## 2020-05-12 DIAGNOSIS — J3089 Other allergic rhinitis: Secondary | ICD-10-CM

## 2020-05-12 NOTE — Progress Notes (Signed)
VIALS EXP 05-12-21

## 2020-05-13 ENCOUNTER — Other Ambulatory Visit: Payer: Self-pay | Admitting: Neurology

## 2020-05-13 ENCOUNTER — Other Ambulatory Visit: Payer: Self-pay | Admitting: Internal Medicine

## 2020-05-13 DIAGNOSIS — J301 Allergic rhinitis due to pollen: Secondary | ICD-10-CM | POA: Diagnosis not present

## 2020-05-15 DIAGNOSIS — R69 Illness, unspecified: Secondary | ICD-10-CM | POA: Diagnosis not present

## 2020-05-18 DIAGNOSIS — Z20822 Contact with and (suspected) exposure to covid-19: Secondary | ICD-10-CM | POA: Diagnosis not present

## 2020-05-21 ENCOUNTER — Encounter: Payer: Self-pay | Admitting: Adult Health

## 2020-05-21 ENCOUNTER — Ambulatory Visit (INDEPENDENT_AMBULATORY_CARE_PROVIDER_SITE_OTHER): Payer: Medicare HMO | Admitting: Adult Health

## 2020-05-21 ENCOUNTER — Other Ambulatory Visit: Payer: Self-pay

## 2020-05-21 VITALS — BP 126/72 | HR 92 | Temp 95.5°F | Ht 63.0 in | Wt 211.6 lb

## 2020-05-21 DIAGNOSIS — J0140 Acute pansinusitis, unspecified: Secondary | ICD-10-CM | POA: Diagnosis not present

## 2020-05-21 MED ORDER — PREDNISONE 20 MG PO TABS: ORAL_TABLET | ORAL | 0 refills | Status: DC

## 2020-05-21 MED ORDER — CEFIXIME 400 MG PO CAPS: 400.0000 mg | ORAL_CAPSULE | Freq: Every day | ORAL | 0 refills | Status: DC

## 2020-05-21 NOTE — Patient Instructions (Addendum)
HOW TO TREAT VIRAL COUGH AND COLD SYMPTOMS:  -Symptoms usually last at least 1 week with the worst symptoms being around day 4.  - colds usually start with a sore throat and end with a cough, and the cough can take 2 weeks to get better.  -No antibiotics are needed for colds, flu, sore throats, cough, bronchitis UNLESS symptoms are longer than 7 days OR if you are getting better then get drastically worse.  -There are a lot of combination medications (Dayquil, Nyquil, Vicks 44, tyelnol cold and sinus, ETC). Please look at the ingredients on the back so that you are treating the correct symptoms and not doubling up on medications/ingredients.    Medicines you can use  Nasal congestion  Little Remedies saline spray (aerosol/mist)- can try this, it is in the kids section - pseudoephedrine (Sudafed)- behind the counter, do not use if you have high blood pressure, medicine that have -D in them.  - phenylephrine (Sudafed PE) -Dextormethorphan + chlorpheniramine (Coridcidin HBP)- okay if you have high blood pressure -Oxymetazoline (Afrin) nasal spray- LIMIT to 3 days -Saline nasal spray -Neti pot (used distilled or bottled water)  Ear pain/congestion  -pseudoephedrine (sudafed) - Nasonex/flonase nasal spray  Fever  -Acetaminophen (Tyelnol) -Ibuprofen (Advil, motrin, aleve)  Sore Throat  -Acetaminophen (Tyelnol) -Ibuprofen (Advil, motrin, aleve) -Drink a lot of water -Gargle with salt water - Rest your voice (don't talk) -Throat sprays -Cough drops  Body Aches  -Acetaminophen (Tyelnol) -Ibuprofen (Advil, motrin, aleve)  Headache  -Acetaminophen (Tyelnol) -Ibuprofen (Advil, motrin, aleve) - Exedrin, Exedrin Migraine  Allergy symptoms (cough, sneeze, runny nose, itchy eyes) -Claritin or loratadine cheapest but likely the weakest  -Zyrtec or certizine at night because it can make you sleepy -The strongest is allegra or fexafinadine  Cheapest at [employer], [employer],  [employer]  Cough  -Dextromethorphan (Delsym)- medicine that has DM in it -Guafenesin (Mucinex/Robitussin) - cough drops - drink lots of water  Chest Congestion  -Guafenesin (Mucinex/Robitussin)  Red Itchy Eyes  - Naphcon-A  Upset Stomach  - Bland diet (nothing spicy, greasy, fried, and high acid foods like tomatoes, oranges, berries) -OKAY- cereal, bread, soup, crackers, rice -Eat smaller more frequent meals -reduce caffeine, no alcohol -Loperamide (Imodium-AD) if diarrhea -Prevacid for heart burn  General health when sick  -Hydration -wash your hands frequently -keep surfaces clean -change pillow cases and sheets often -Get fresh air but do not exercise strenuously -Vitamin D, double up on it - Vitamin C -Zinc    Cefixime Oral Tablets or Capsules What is this medicine? CEFIXIME (sef IX eem) is a cephalosporin antibiotic. It treats some infections caused by bacteria. It will not work for colds, the flu, or other viruses. This medicine may be used for other purposes; ask your health care provider or pharmacist if you have questions. COMMON BRAND NAME(S): Suprax What should I tell my health care provider before I take this medicine? They need to know if you have any of these conditions:  bleeding problems  kidney disease  stomach or intestine problems (especially colitis)  an unusual or allergic reaction to cefixime, other cephalosporin or penicillin antibiotics, other foods, dyes or preservatives  pregnant or trying to get pregnant  breast-feeding How should I use this medicine? Take this drug by mouth. Take it as directed on the prescription label at the same time every day. You can take it with or without food. If it upsets your stomach, take it with food. Take all of this drug unless  your health care provider tells you to stop it early. Keep taking it even if you think you are better. Talk to your health care provider about the use of this drug in children. While  it may be prescribed for selected conditions, precautions do apply. Overdosage: If you think you have taken too much of this medicine contact a poison control center or emergency room at once. NOTE: This medicine is only for you. Do not share this medicine with others. What if I miss a dose? If you miss a dose, take it as soon as you can. If it is almost time for your next dose, take only that dose. Do not take double or extra doses. What may interact with this medicine?  aspirin and aspirin-like medicines  carbamazepine  medicines that treat or prevent blood clots like warfarin This list may not describe all possible interactions. Give your health care provider a list of all the medicines, herbs, non-prescription drugs, or dietary supplements you use. Also tell them if you smoke, drink alcohol, or use illegal drugs. Some items may interact with your medicine. What should I watch for while using this medicine? Tell your doctor or health care provider if your symptoms do not improve or if you get new symptoms. Your doctor will monitor your condition and blood work as needed. This medicine may cause serious skin reactions. They can happen weeks to months after starting the medicine. Contact your health care provider right away if you notice fevers or flu-like symptoms with a rash. The rash may be red or purple and then turn into blisters or peeling of the skin. Or, you might notice a red rash with swelling of the face, lips or lymph nodes in your neck or under your arms. Do not treat diarrhea with over the counter products. Contact your doctor if you have diarrhea that lasts more than 2 days or if it is severe and watery. This medicine can interfere with some urine glucose and some urine ketone tests. If you use such tests, talk with your health care provider. If you are being treated for a sexually transmitted disease, avoid sexual contact until you have finished your treatment. Having sex can  infect your sexual partner. What side effects may I notice from receiving this medicine? Side effects that you should report to your doctor or health care professional as soon as possible:  allergic reactions like skin rash, itching or hives, swelling of the face, lips, or tongue  bloody or watery diarrhea  difficulty breathing or wheezing  dizziness  fever  pain or trouble passing urine or change in the amount of urine  redness, blistering, peeling or loosening of the skin, including inside the mouth  seizures  unusual bleeding or bruising  unusually weak or tired  yellowing of the eyes or skin Side effects that usually do not require medical attention (report to your doctor or health care professional if they continue or are bothersome):  diarrhea  headache  genital or anal irritation  loss of appetite  nausea, vomiting  stomach pain, upset, or gas This list may not describe all possible side effects. Call your doctor for medical advice about side effects. You may report side effects to FDA at 1-800-FDA-1088. Where should I keep my medicine? Keep out of the reach of children and pets. Store at room temperature between 20 and 25 degrees C (68 and 77 degrees F). Throw away any unused drug after the expiration date. NOTE: This sheet is a  summary. It may not cover all possible information. If you have questions about this medicine, talk to your doctor, pharmacist, or health care provider.  2020 Elsevier/Gold Standard (2019-05-22 13:26:54)

## 2020-05-21 NOTE — Progress Notes (Signed)
Assessment and Plan:  Grace Barry was seen today for acute visit.  Diagnoses and all orders for this visit:  Acute non-recurrent pansinusitis 3 weeks of URI sx; exam consistent with sinusitis Duration appropriate for antibiotic tx Due to abx allergies will give cefixime 400 mg Suggested symptomatic OTC remedies. Nasal saline spray for congestion. Nasal steroids, allergy pill, oral steroids offered Follow up as needed, would expect some improvement over the weekend  -     cefixime (SUPRAX) 400 MG CAPS capsule; Take 1 capsule (400 mg total) by mouth daily. -     predniSONE (DELTASONE) 20 MG tablet; 2 tablets daily for 3 days, 1 tablet daily for 4 days.   Further disposition pending results of labs. Discussed med's effects and SE's.   Over 15 minutes of exam, counseling, chart review, and critical decision making was performed.   Future Appointments  Date Time Provider [place]  06/14/2020  2:00 PM Vicie Mutters, PA-C [facility] None  07/28/2020 11:15 AM Suzzanne Cloud, NP [facility] None  10/08/2020 11:00 AM Festus Aloe, MD [facility] None  11/16/2020  2:30 PM Valentina Shaggy, MD [facility] None  12/22/2020  3:00 PM Liane Comber, NP [facility] None    ------------------------------------------------------------------------------------------------------------------   HPI BP 126/72    Pulse 92    Temp (!) 95.5 F (35.3 C)    Ht 5\' 3"  (1.6 m)    Wt (!) 211 lb 9.6 oz (96 kg)    SpO2 97%    BMI 37.48 kg/m   69 y.o.female with T2DM, OSA, allergic rhinitis presents for 3 weeks of URI sx.   She reports started with sore throat, then developed sinus pressure/tenderness, sinus headache, fatigue, then developed cough that settled into her chest (productive of scant yellow mucus) - but feels this is due to nasal drainage/irritation, cough has improved, nasal congestion (yellow). Does have sense of fever/chills, has had mild temp in ~99.8 but no temp in last few days. Endorses mild  generalized aches, no arthralgias. Denies dyspnea, wheezing, CP, dizziness, edema.   Did have negative covid 19 test 2 days ago. She did have 2/2, pfizer covid 19 vaccine Husband has runny nose only.   She takes zyrtec daily for environmental allergies, added mucinex (didn't do much), does do nasal spray (? Nasonex).     Past Medical History:  Diagnosis Date   Allergy    Anxiety    Arthritis    Asthma    Cataract    Chronic kidney disease (CKD), stage II (mild) 07/01/2014   Overview:  Overview:  Due to DM, last GFR 64    Depression    Fibromyalgia    GERD (gastroesophageal reflux disease)    Hyperlipidemia    Hypertension    IBS (irritable bowel syndrome)    Liver disease    Migraine 02/19/2015   Migraines    Obesity    Osteoporosis    Sleep apnea    uses oral device   Sleep difficulties    Thyroid disease    hypothyroidism   Tuberculosis    positive PPD test, chest x ray negative   Type II or unspecified type diabetes mellitus without mention of complication, not stated as uncontrolled    Urticaria    Ventral hernia    Vertigo 02/19/2015     Allergies  Allergen Reactions   Floxin [Ofloxacin] Hives   Invokana [Canagliflozin]     Hematuria- avoid class   Viberzi [Eluxadoline] Hives   Doxycycline Rash   Penicillins Rash  Current Outpatient Medications on File Prior to Visit  Medication Sig   aspirin 81 MG tablet Take 81 mg by mouth daily.   baclofen (LIORESAL) 10 MG tablet Take 1 tablet (10 mg total) by mouth 2 (two) times daily as needed for muscle spasms.   CALCIUM PO Take 1,200 mg by mouth daily.   cetirizine (ZYRTEC) 10 MG tablet Take 10 mg by mouth as needed. Reported on 10/20/2015   Cyanocobalamin (VITAMIN B12 SL) Place 1 tablet under the tongue daily.   EPINEPHrine (EPIPEN 2-PAK) 0.3 mg/0.3 mL IJ SOAJ injection Inject 0.3 mg into the muscle once.   famotidine (PEPCID) 40 MG tablet TAKE 1 TABLET BY MOUTH TWICE A DAY. (  REPLACING ZANTAC/RANITIDINE)   glucose blood (ONE TOUCH ULTRA TEST) test strip Check Blood sugar Daily for diabetes   Lancets (ONETOUCH ULTRASOFT) lancets Check Blood sugar  daily   losartan (COZAAR) 100 MG tablet TAKE 1 TABLET DAILY FOR BP AND KIDNEY PROTECTION   Magnesium 250 MG TABS Take 250 mg by mouth 2 (two) times daily.   metFORMIN (GLUCOPHAGE-XR) 500 MG 24 hr tablet Take 2 tablets 2 x /day with a meal for Diabetes   metoprolol succinate (TOPROL-XL) 50 MG 24 hr tablet TAKE 1 TABLET (50 MG TOTAL) BY MOUTH DAILY. TAKE WITH OR IMMEDIATELY FOLLOWING A MEAL.   Multiple Vitamins-Minerals (MULTIVITAMIN PO) Take 1 tablet by mouth daily.    Omega-3 Fatty Acids (FISH OIL PO) Take by mouth daily.   PARoxetine (PAXIL) 40 MG tablet TAKE 1 TABLET BY MOUTH DAILY FOR MOOD   PRESCRIPTION MEDICATION Inject as directed every 30 (thirty) days. Allergy Shot   rosuvastatin (CRESTOR) 20 MG tablet Take 1 tablet Daily for Cholesterol   Semaglutide,0.25 or 0.5MG /DOS, (OZEMPIC, 0.25 OR 0.5 MG/DOSE,) 2 MG/1.5ML SOPN Inject 0.5 mg into the skin once a week.   SYNTHROID 200 MCG tablet Take 1 tablet daily on an empty stomach with only water for 30 minutes & no Antacid meds, Calcium or Magnesium for 4 hours & avoid Biotin   topiramate (TOPAMAX) 50 MG tablet Take 1/2 to 1 tablet 2 x /day with Supper & Bedtime for Dieting & Weight Loss (Patient taking differently: Take 1/2 to 1 tablet daily)   Turmeric (QC TUMERIC COMPLEX PO) Take by mouth.   Vitamin D, Ergocalciferol, (DRISDOL) 1.25 MG (50000 UNIT) CAPS capsule TAKE 1 CAPSULE 4 X /WEEK FOR VIT D DEFICIENCY   No current facility-administered medications on file prior to visit.    ROS: all negative except above.   Physical Exam:  BP 126/72    Pulse 92    Temp (!) 95.5 F (35.3 C)    Ht 5\' 3"  (1.6 m)    Wt (!) 211 lb 9.6 oz (96 kg)    SpO2 97%    BMI 37.48 kg/m   General Appearance: Well nourished, in no apparent distress. Eyes: PERRLA, conjunctiva  no swelling or erythema Sinuses: Generalized Frontal/maxillary tenderness without edema ENT/Mouth: Ext aud canals clear, TMs without erythema, bulging. No erythema, swelling, or exudate on post pharynx.  Tonsils not swollen or erythematous. Hearing normal.  Neck: Supple, thyroid normal.   Respiratory: Respiratory effort normal, BS equal bilaterally without rales, rhonchi, wheezing or stridor.  Cardio: RRR with no MRGs. Brisk peripheral pulses without edema.  Abdomen: Soft, + BS.  Non tender, no guarding. Lymphatics: Non tender without lymphadenopathy.  Musculoskeletal: No effusions, no obivous deformity, normal gait.  Skin: Warm, dry without rashes, lesions, ecchymosis.  Neuro: Cranial nerves  intact. Normal muscle tone Psych: Awake and oriented X 3, normal affect, Insight and Judgment appropriate.     Izora Ribas, NP 9:07 AM [facility]

## 2020-05-27 ENCOUNTER — Other Ambulatory Visit: Payer: Self-pay | Admitting: Internal Medicine

## 2020-06-13 NOTE — Progress Notes (Signed)
CPE AND FOLLOW UP  Assessment:   Encounter for general adult medical examination with abnormal findings 1 year  Type 2 diabetes mellitus with other diabetic kidney complication, without long-term current use of insulin (HCC) -     Hemoglobin A1c Discussed general issues about diabetes pathophysiology and management., Educational material distributed., Suggested low cholesterol diet., Encouraged aerobic exercise., Discussed foot care., Reminded to get yearly retinal exam.  CKD stage 2 due to type 2 diabetes mellitus ([place]) -     Hemoglobin A1c Increase fluids, avoid NSAIDS, monitor sugars, will monitor  Hyperlipidemia associated with type 2 diabetes mellitus ([place]) -     Lipid panel check lipids decrease fatty foods increase activity.   Depression, major, recurrent, in remission ([place]) -     DULoxetine (CYMBALTA) 30 MG capsule; Take 1 capsule (30 mg total) by mouth daily. ? Contributing to fatigue- has been on cymbalta in the past and did well- will try to stop paxil and switch to cymbalta.   B12 deficiency -     Vitamin B12  Anemia, unspecified type -     Iron,Total/Total Iron Binding Cap -     Ferritin  Fatigue, unspecified type ? On mouth piece, has not had sleep study in a long time- with morning fatigue suggest formal sleep study with neuro Check labs Treat depression Close folllow up -     Iron,Total/Total Iron Binding Cap -     Ferritin -     Vitamin B12 -     EKG 12-Lead -     Ambulatory referral to Sleep Studies -     DULoxetine (CYMBALTA) 30 MG capsule; Take 1 capsule (30 mg total) by mouth daily.  Atherosclerosis of aorta Per CT 07/2019 Control blood pressure, cholesterol, glucose, increase exercise.    Essential hypertension - continue medications, DASH diet, exercise and monitor at home. Call if greater than 130/80.  - CBC with Differential/Platelet - CMP/GFR - TSH   Obstructive sleep apnea Sleep apnea- continue mouth piece, weight loss advised.    Asthma, unspecified asthma severity, uncomplicated controlled  Gastroesophageal reflux disease without esophagitis Continue PPI/H2 blocker, diet discussed  Other specified hypothyroidism Hypothyroidism-check TSH level, continue medications the same, reminded to take on an empty stomach 30-99mins before food.   Medication management - Magnesium  Arthritis RICE, NSAIDS PRN, ortho referral if needed  Diarrhea IBS, controlled   History of colonic polyps Due - referral placed  Morbid obesity ([place]) - increase veggies, decrease carbs - long discussion about weight loss, diet, and exercise - phentermine d/c'd due to lack of benefit - patient admits to emotional eating; strategies discussed, get chocolate out of the house, get husband on board, have plan for when cravings hit, try protein heavy snacks, information provided  Hypothyroidism continue medications the same pending lab results reminded to take on an empty stomach 30-65mins before food.  check TSH level  Liver disease/ fatty liver disease Check labs, avoid tylenol, alcohol, weight loss advised.   Over 30 minutes of exam, counseling, chart review, and critical decision making was performed  Future Appointments  Date Time Provider [place]  07/20/2020  2:30 PM Vicie Mutters, PA-C [facility] None  07/28/2020 11:15 AM Suzzanne Cloud, NP [facility] None  10/08/2020 11:00 AM Festus Aloe, MD [facility] None  11/16/2020  2:30 PM Valentina Shaggy, MD [facility] None  12/22/2020  3:00 PM Liane Comber, NP [facility] None  06/22/2021  2:00 PM Vicie Mutters, PA-C [facility] None      Subjective:  Grace Barry is a 69 y.o. female who presents for CPE and 3 month follow up on hypertension, T2 diabetes with CKD, hyperlipidemia, vitamin D def.   She and her husband travel a lot, none this year.   She has followed with Dr. Toy Cookey and is on a mouth piece for OSA. She states she does not feel like she has good  sleep. She will still wake up tired.  Lab Results  Component Value Date   IRON 64 06/14/2020   TIBC 443 06/14/2020   FERRITIN 22 06/14/2020   Lab Results  Component Value Date   [id number] 371 06/14/2020    She has history of migraines, she has been following with Dr. Jannifer Franklin, on topamax 50 mg once a day. She is hx of depression for many years in remission on paxil 40 mg daily,. She has been on Cymbalta in the past, she did fine on that in the past.    BMI is Body mass index is 37.31 kg/m., she has not been working on diet and exercise. She eats 1-2 meals a day. She emotionally and bored eat. She drink 120 oz of water a day, with crystal lite very diluted. She has not been working out since pandemic and knee pain, plans to restart water aerobics once able.   she is prescribed phentermine and topamax for weight loss.  While on the medication they have lost 0 lbs since last visit. They deny palpitations, anxiety, trouble sleeping, elevated BP.  Wt Readings from Last 3 Encounters:  06/14/20 214 lb (97.1 kg)  05/21/20 (!) 211 lb 9.6 oz (96 kg)  03/24/20 215 lb (97.5 kg)   She had CT coronary 07/2019 -CAD-RADS 1. Minimal non-obstructive CAD (0-24%) but did show aortic atherosclerosis Her blood pressure has been controlled at home,  Metoprolol 50mg  XL for palpitations, losartan  100 mg,BP: 122/80  She does workout. She denies chest pain, shortness of breath, dizziness.   She is on cholesterol medication, crestor 20mg  daily and denies myalgias. Her cholesterol is at goal. The cholesterol was:   Lab Results  Component Value Date   CHOL 100 06/14/2020   HDL 43 (L) 06/14/2020   LDLCALC 34 06/14/2020   TRIG 155 (H) 06/14/2020   CHOLHDL 2.3 06/14/2020   She has been working on diet and exercise for Diabetes  with CKD III on ACE/ARB With hyperlipidemia  Metformin ozempic 0.5mg  once a week- now tolerating well She was taken off invokana/jardiance due to gross hematuria/repeated  infections she is on bASA  and denies paresthesia of the feet, polydipsia, polyuria and visual disturbances. D[facility] [place] [facility]- scheduled Sept 2021 Not checking sugars.  Last A1C was:  Lab Results  Component Value Date   HGBA1C 6.5 (H) 06/14/2020    She has CKD IIIa associated with T2DM monitored at this office:  Lab Results  Component Value Date   [facility] 65 06/14/2020   Patient is on Vitamin D supplement and at goal at recent check:  Lab Results  Component Value Date   VD25OH 142 (H) 06/14/2020   She is on thyroid medication, she is not on biotin. Normal thyroid US 2014. Her medication was not changed last visit,.  Lab Results  Component Value Date   TSH 0.77 06/14/2020      Medication Review  Current Outpatient Medications (Endocrine & Metabolic):  .  metFORMIN (GLUCOPHAGE-XR) 500 MG 24 hr tablet, Take 2 tablets 2 x /day with a meal for Diabetes .  Semaglutide,0.25 or 0.5MG /DOS, (  OZEMPIC, 0.25 OR 0.5 MG/DOSE,) 2 MG/1.5ML SOPN, Inject 0.5 mg into the skin once a week. Marland Kitchen  SYNTHROID 200 MCG tablet, TAKE 1 TABLET DAILY ON AN EMPTY STOMACH WITH ONLY WATER FOR 30 MINUTES & NO ANTACID MEDS, CALCIUM OR MAGNESIUM FOR 4 HOURS & AVOID BIOTIN  Current Outpatient Medications (Cardiovascular):  Marland Kitchen  EPINEPHrine (EPIPEN 2-PAK) 0.3 mg/0.3 mL IJ SOAJ injection, Inject 0.3 mg into the muscle once. Marland Kitchen  losartan (COZAAR) 100 MG tablet, TAKE 1 TABLET DAILY FOR BP AND KIDNEY PROTECTION .  metoprolol succinate (TOPROL-XL) 50 MG 24 hr tablet, TAKE 1 TABLET (50 MG TOTAL) BY MOUTH DAILY. TAKE WITH OR IMMEDIATELY FOLLOWING A MEAL. .  rosuvastatin (CRESTOR) 20 MG tablet, TAKE 1 TABLET BY MOUTH EVERY DAY FOR CHOLESTEROL  Current Outpatient Medications (Respiratory):  .  cetirizine (ZYRTEC) 10 MG tablet, Take 10 mg by mouth as needed. Reported on 10/20/2015  Current Outpatient Medications (Analgesics):  .  aspirin 81 MG tablet, Take 81 mg by mouth daily.  Current Outpatient Medications  (Hematological):  Marland Kitchen  Cyanocobalamin (VITAMIN B12 SL), Place 1 tablet under the tongue daily.  Current Outpatient Medications (Other):  .  baclofen (LIORESAL) 10 MG tablet, Take 1 tablet (10 mg total) by mouth 2 (two) times daily as needed for muscle spasms. Marland Kitchen  CALCIUM PO, Take 1,200 mg by mouth daily. .  cefixime (SUPRAX) 400 MG CAPS capsule, Take 1 capsule (400 mg total) by mouth daily. .  famotidine (PEPCID) 40 MG tablet, TAKE 1 TABLET BY MOUTH TWICE A DAY. ( REPLACING ZANTAC/RANITIDINE) .  glucose blood (ONE TOUCH ULTRA TEST) test strip, Check Blood sugar Daily for diabetes .  Lancets (ONETOUCH ULTRASOFT) lancets, Check Blood sugar  daily .  Magnesium 250 MG TABS, Take 250 mg by mouth 2 (two) times daily. .  Multiple Vitamins-Minerals (MULTIVITAMIN PO), Take 1 tablet by mouth daily.  .  Omega-3 Fatty Acids (FISH OIL PO), Take by mouth daily. Marland Kitchen  PRESCRIPTION MEDICATION, Inject as directed every 30 (thirty) days. Allergy Shot .  topiramate (TOPAMAX) 50 MG tablet, Take 1/2 to 1 tablet 2 x /day with Supper & Bedtime for Dieting & Weight Loss (Patient taking differently: Take 1/2 to 1 tablet daily) .  Turmeric (QC TUMERIC COMPLEX PO), Take by mouth. .  Vitamin D, Ergocalciferol, (DRISDOL) 1.25 MG (50000 UNIT) CAPS capsule, TAKE 1 CAPSULE 4 X /WEEK FOR VIT D DEFICIENCY .  DULoxetine (CYMBALTA) 30 MG capsule, Take 1 capsule (30 mg total) by mouth daily.  Current Problems (verified) Patient Active Problem List   Diagnosis Date Noted  . Aortic atherosclerosis ([place]) 12/12/2019  . Degenerative tear of triangular fibrocartilage complex (TFCC) of left wrist 01/17/2019  . Pain 01/17/2019  . Hyperlipidemia associated with type 2 diabetes mellitus ([place]) 11/07/2018  . Osteopenia 11/07/2018  . Seasonal and perennial allergic rhinitis 07/17/2018  . Sensorineural hearing loss (SNHL) of left ear with unrestricted hearing of right ear 12/31/2017  . Bilateral impacted cerumen 05/31/2016  . Dysfunction of  left eustachian tube 05/31/2016  . Left ear hearing loss 03/28/2016  . Vitamin D deficiency disease 05/19/2015  . Medication management 05/19/2015  . Migraine 02/19/2015  . Vertigo 02/19/2015  . History of colonic polyps 01/15/2015  . Diarrhea 09/01/2014  . Morbid obesity ([place]) 07/01/2014  . CKD stage 2 due to type 2 diabetes mellitus ([place]) 07/01/2014  . Fatty liver   . Arthritis   . Hypothyroidism 12/15/2010  . T2_NIDDM w/CKD (GFR 56 ml/min) 12/15/2010  . Depression,  major, recurrent, in remission ([place]) 12/15/2010  . Obstructive sleep apnea 12/15/2010  . Essential hypertension 12/15/2010  . GERD 12/15/2010    Screening Tests Immunization History  Administered Date(s) Administered  . Influenza, High Dose Seasonal PF 08/10/2015, 08/10/2016, 08/28/2017, 11/07/2018, 08/07/2019  . Influenza-Unspecified 08/05/2019  . PFIZER SARS-COV-2 Vaccination 11/19/2019, 12/10/2019  . Pneumococcal Conjugate-13 07/01/2014  . Pneumococcal Polysaccharide-23 10/09/2002, 05/26/2008, 12/13/2016  . Td 10/09/2002, 10/30/2010  . Tdap 08/10/2015  . Zoster 06/20/2011    Preventative care: Tetanus: 2016 Pneumovax: 2018 Prevnar 13: 2015 Flu vaccine: 07/2019 Zostavax: 2012  Pap: 2009 remote declines another MGM: 10/2018 q 2 years DEXA: 2020 osteopenia -1.5 CXR 2020 Colonoscopy: 12/2019 Dr. Peggye Ley return 3 years EGD: 10/2014 + gastritis MRI 11/20/2017 PFT 06/2018  CT coronary 07/2019 -CAD-RADS 1. Minimal non-obstructive CAD (0-24%).  aortic atherosclerosis  Names of Other Physician/Practitioners you currently use: 1. [facility]- here for primary care 2. Dr. Wynetta Emery, [place], eye doctor, last visit 2019, patient has insurance again, plans to schedule  3. Dr. Betsey Holiday, dentist, last visit q 6 months 4. Dr. Domingo Cocking, neuro  5. Dr. Toy Cookey, sleep apnea  Patient Care Team: Unk Pinto, MD as PCP - General (Internal Medicine) Jola Baptist, [place] as  Anesthesiologist (Chiropractic Medicine)   Allergies Allergies  Allergen Reactions  . Floxin [Ofloxacin] Hives  . Invokana [Canagliflozin]     Hematuria- avoid class  . Viberzi [Eluxadoline] Hives  . Doxycycline Rash  . Penicillins Rash    SURGICAL HISTORY She  has a past surgical history that includes Total knee arthroplasty; Cholecystectomy; Abdominal hysterectomy; Cesarean section; Tonsillectomy; Knee arthroscopy; Carpal tunnel release; Hernia repair (07/24/11); Joint replacement; Adenoidectomy; Sinoscopy; Cataract extraction, bilateral; and Colonoscopy. FAMILY HISTORY Her family history includes Breast cancer in her mother; Colon cancer in her son; Esophageal cancer (age of onset: 58) in her son; Hyperlipidemia in her father and sister; Hypertension in her father; Prostate cancer in her father; Stomach cancer in her son. SOCIAL HISTORY She  reports that she has never smoked. She has never used smokeless tobacco. She reports current alcohol use. She reports that she does not use drugs.  Review of Systems:  Review of Systems  Constitutional: Negative.   HENT: Negative for congestion, ear discharge, ear pain, hearing loss, nosebleeds, sore throat and tinnitus.   Eyes: Negative.   Respiratory: Negative.  Negative for stridor.   Cardiovascular: Negative.   Gastrointestinal: Negative for abdominal pain, blood in stool, constipation, diarrhea, heartburn, melena, nausea and vomiting.  Genitourinary: Negative.   Musculoskeletal: Negative.  Negative for falls.  Skin: Negative.   Neurological: Positive for headaches. Negative for dizziness, tingling, tremors, sensory change, speech change, focal weakness, seizures and loss of consciousness.  Endo/Heme/Allergies: Negative.   Psychiatric/Behavioral: Negative for depression, substance abuse and suicidal ideas. The patient is not nervous/anxious and does not have insomnia.      Objective:   Today's Vitals   06/14/20 1409  BP: 122/80   Pulse: 78  Temp: (!) 97.5 F (36.4 C)  SpO2: 98%  Weight: 214 lb (97.1 kg)  Height: 5' 3.5" (1.613 m)   General appearance: alert, no distress, WD/WN,  female HEENT: normocephalic, sclerae anicteric, TMs normal, no erythema, burlging.  nares patent, no discharge or erythema, pharynx normal Oral cavity: MMM, no lesions Neck: supple, no lymphadenopathy, no thyromegaly, + left sided small thyroid nodules, no masses Heart: RRR, normal S1, S2, no murmurs Lungs: CTA bilaterally, no wheezes, rhonchi, or rales Abdomen: +bs, soft, non tender, non  distended, no masses, no hepatomegaly, no splenomegaly Musculoskeletal: nontender, no swelling, no obvious deformity Extremities: no edema, no cyanosis, no clubbing Pulses: 2+ symmetric, upper and lower extremities, normal cap refill Neurological: alert, oriented x 3, CN2-12 intact, strength normal upper extremities and lower extremities, sensation normal throughout, DTRs 2+ throughout, no cerebellar signs, gait normal Psychiatric: normal affect, behavior normal, pleasant   Vicie Mutters, PA-C   06/15/2020

## 2020-06-14 ENCOUNTER — Other Ambulatory Visit: Payer: Self-pay | Admitting: Internal Medicine

## 2020-06-14 ENCOUNTER — Ambulatory Visit (INDEPENDENT_AMBULATORY_CARE_PROVIDER_SITE_OTHER): Payer: Medicare HMO | Admitting: Physician Assistant

## 2020-06-14 ENCOUNTER — Other Ambulatory Visit: Payer: Self-pay

## 2020-06-14 ENCOUNTER — Encounter: Payer: Self-pay | Admitting: Physician Assistant

## 2020-06-14 VITALS — BP 122/80 | HR 78 | Temp 97.5°F | Ht 63.5 in | Wt 214.0 lb

## 2020-06-14 DIAGNOSIS — M8580 Other specified disorders of bone density and structure, unspecified site: Secondary | ICD-10-CM

## 2020-06-14 DIAGNOSIS — Z79899 Other long term (current) drug therapy: Secondary | ICD-10-CM

## 2020-06-14 DIAGNOSIS — E538 Deficiency of other specified B group vitamins: Secondary | ICD-10-CM | POA: Diagnosis not present

## 2020-06-14 DIAGNOSIS — E559 Vitamin D deficiency, unspecified: Secondary | ICD-10-CM

## 2020-06-14 DIAGNOSIS — I700 Atherosclerosis of aorta: Secondary | ICD-10-CM | POA: Diagnosis not present

## 2020-06-14 DIAGNOSIS — G43009 Migraine without aura, not intractable, without status migrainosus: Secondary | ICD-10-CM

## 2020-06-14 DIAGNOSIS — K760 Fatty (change of) liver, not elsewhere classified: Secondary | ICD-10-CM | POA: Diagnosis not present

## 2020-06-14 DIAGNOSIS — E1122 Type 2 diabetes mellitus with diabetic chronic kidney disease: Secondary | ICD-10-CM

## 2020-06-14 DIAGNOSIS — I10 Essential (primary) hypertension: Secondary | ICD-10-CM

## 2020-06-14 DIAGNOSIS — E785 Hyperlipidemia, unspecified: Secondary | ICD-10-CM

## 2020-06-14 DIAGNOSIS — R42 Dizziness and giddiness: Secondary | ICD-10-CM

## 2020-06-14 DIAGNOSIS — D649 Anemia, unspecified: Secondary | ICD-10-CM

## 2020-06-14 DIAGNOSIS — E1169 Type 2 diabetes mellitus with other specified complication: Secondary | ICD-10-CM

## 2020-06-14 DIAGNOSIS — E1129 Type 2 diabetes mellitus with other diabetic kidney complication: Secondary | ICD-10-CM | POA: Diagnosis not present

## 2020-06-14 DIAGNOSIS — F3340 Major depressive disorder, recurrent, in remission, unspecified: Secondary | ICD-10-CM

## 2020-06-14 DIAGNOSIS — E039 Hypothyroidism, unspecified: Secondary | ICD-10-CM | POA: Diagnosis not present

## 2020-06-14 DIAGNOSIS — N182 Chronic kidney disease, stage 2 (mild): Secondary | ICD-10-CM

## 2020-06-14 DIAGNOSIS — H9192 Unspecified hearing loss, left ear: Secondary | ICD-10-CM

## 2020-06-14 DIAGNOSIS — R5383 Other fatigue: Secondary | ICD-10-CM

## 2020-06-14 DIAGNOSIS — G4733 Obstructive sleep apnea (adult) (pediatric): Secondary | ICD-10-CM

## 2020-06-14 DIAGNOSIS — Z0001 Encounter for general adult medical examination with abnormal findings: Secondary | ICD-10-CM | POA: Diagnosis not present

## 2020-06-14 DIAGNOSIS — Z86010 Personal history of colonic polyps: Secondary | ICD-10-CM

## 2020-06-14 DIAGNOSIS — K219 Gastro-esophageal reflux disease without esophagitis: Secondary | ICD-10-CM

## 2020-06-14 DIAGNOSIS — Z130 Encounter for screening for diseases of the blood and blood-forming organs and certain disorders involving the immune mechanism: Secondary | ICD-10-CM

## 2020-06-14 DIAGNOSIS — M1990 Unspecified osteoarthritis, unspecified site: Secondary | ICD-10-CM

## 2020-06-14 MED ORDER — DULOXETINE HCL 30 MG PO CPEP: 30.0000 mg | ORAL_CAPSULE | Freq: Every day | ORAL | 2 refills | Status: DC

## 2020-06-14 NOTE — Patient Instructions (Signed)
Can check out gene testing  https://www.genomind.com/resources/how-to-get-the-test  Can do paxil 20 mg x 2 weeks Do the cymbalta 30 mg once a day for 2 weeks and then increase to 60 mg a day.   Please be aware that some of the medications that you are on can sometimes cause a rare and potentially dangerous adverse reaction, called SEROTONIN SYNDROME: Symptoms of this condition include (but are not limited to):  Agitation or restlessness, confusion, rapid heart rate and high blood pressure, dilated pupils, loss of muscle coordination or twitching muscles, muscle rigidity/stiffness, sweating and/or flushing, diarrhea, headache, shivering, goose bumps. If you have any of these symptoms you may have to stop the medication. Call your health care provider immediately.  Severe serotonin syndrome can be life-threatening emergency. Signs and symptoms of a severe reaction may include: high fever, seizures, irregular heartbeat, unconsciousness or altered level of awareness or personality changes.  If you have any of these new symptoms, call 911 or have someone take you to the emergency room.    Ask insurance and pharmacy about shingrix - it is a 2 part shot that we will not be getting in the office.   Suggest getting AFTER covid vaccines, have to wait at least a month This shot can make you feel bad due to such good immune response it can trigger some inflammation so take tylenol or aleve day of or day after and plan on resting.   Can go to [employer].br for more information  Shingrix Vaccination  Two vaccines are licensed and recommended to prevent shingles in the [place] Zoster vaccine live (ZVL, Zostavax) has been in use since 2006. Recombinant zoster vaccine (RZV, Shingrix), has been in use since 2017 and is recommended by [employer] as the preferred shingles vaccine.  What Everyone Should Know about Shingles Vaccine (Shingrix) One of the Recommended  Vaccines by Disease Shingles vaccination is the only way to protect against shingles and postherpetic neuralgia (PHN), the most common complication from shingles. CDC recommends that healthy adults 50 years and older get two doses of the shingles vaccine called Shingrix (recombinant zoster vaccine), separated by 2 to 6 months, to prevent shingles and the complications from the disease. Your doctor or pharmacist can give you Shingrix as a shot in your upper arm. Shingrix provides strong protection against shingles and PHN. Two doses of Shingrix is more than 90% effective at preventing shingles and PHN. Protection stays above 85% for at least the first four years after you get vaccinated. Shingrix is the preferred vaccine, over Zostavax (zoster vaccine live), a shingles vaccine in use since 2006. Zostavax may still be used to prevent shingles in healthy adults 60 years and older. For example, you could use Zostavax if a person is allergic to Shingrix, prefers Zostavax, or requests immediate vaccination and Shingrix is unavailable. Who Should Get Shingrix? Healthy adults 50 years and older should get two doses of Shingrix, separated by 2 to 6 months. You should get Shingrix even if in the past you . had shingles  . received Zostavax  . are not sure if you had chickenpox There is no maximum age for getting Shingrix. If you had shingles in the past, you can get Shingrix to help prevent future occurrences of the disease. There is no specific length of time that you need to wait after having shingles before you can receive Shingrix, but generally you should make sure the shingles rash has gone away before getting vaccinated. You can get Shingrix whether or not you  remember having had chickenpox in the past. Studies show that more than 99% of Americans 40 years and older have had chickenpox, even if they don't remember having the disease. Chickenpox and shingles are related because they are caused by the same  virus (varicella zoster virus). After a person recovers from chickenpox, the virus stays dormant (inactive) in the body. It can reactivate years later and cause shingles. If you had Zostavax in the recent past, you should wait at least eight weeks before getting Shingrix. Talk to your healthcare provider to determine the best time to get Shingrix. Shingrix is available in [facility] and pharmacies. To find doctor's offices or pharmacies near you that offer the vaccine, visit HealthMap Vaccine FinderExternal. If you have questions about Shingrix, talk with your healthcare provider. Vaccine for Those 107 Years and Older  Shingrix reduces the risk of shingles and PHN by more than 90% in people 101 and older. CDC recommends the vaccine for healthy adults 92 and older.  Who Should Not Get Shingrix? You should not get Shingrix if you: . have ever had a severe allergic reaction to any component of the vaccine or after a dose of Shingrix  . tested negative for immunity to varicella zoster virus. If you test negative, you should get chickenpox vaccine.  . currently have shingles  . currently are pregnant or breastfeeding. Women who are pregnant or breastfeeding should wait to get Shingrix.  Marland Kitchen receive specific antiviral drugs (acyclovir, famciclovir, or valacyclovir) 24 hours before vaccination (avoid use of these antiviral drugs for 14 days after vaccination)- zoster vaccine live only If you have a minor acute (starts suddenly) illness, such as a cold, you may get Shingrix. But if you have a moderate or severe acute illness, you should usually wait until you recover before getting the vaccine. This includes anyone with a temperature of 101.19F or higher. The side effects of the Shingrix are temporary, and usually last 2 to 3 days. While you may experience pain for a few days after getting Shingrix, the pain will be less severe than having shingles and the complications from the disease. How Well Does  Shingrix Work? Two doses of Shingrix provides strong protection against shingles and postherpetic neuralgia (PHN), the most common complication of shingles. . In adults 7 to 71 years old who got two doses, Shingrix was 97% effective in preventing shingles; among adults 70 years and older, Shingrix was 91% effective.  . In adults 45 to 71 years old who got two doses, Shingrix was 91% effective in preventing PHN; among adults 70 years and older, Shingrix was 89% effective. Shingrix protection remained high (more than 85%) in people 70 years and older throughout the four years following vaccination. Since your risk of shingles and PHN increases as you get older, it is important to have strong protection against shingles in your older years. Top of Page  What Are the Possible Side Effects of Shingrix? Studies show that Shingrix is safe. The vaccine helps your body create a strong defense against shingles. As a result, you are likely to have temporary side effects from getting the shots. The side effects may affect your ability to do normal daily activities for 2 to 3 days. Most people got a sore arm with mild or moderate pain after getting Shingrix, and some also had redness and swelling where they got the shot. Some people felt tired, had muscle pain, a headache, shivering, fever, stomach pain, or nausea. About 1 out of 6 people  who got Shingrix experienced side effects that prevented them from doing regular activities. Symptoms went away on their own in about 2 to 3 days. Side effects were more common in younger people. You might have a reaction to the first or second dose of Shingrix, or both doses. If you experience side effects, you may choose to take over-the-counter pain medicine such as ibuprofen or acetaminophen. If you experience side effects from Shingrix, you should report them to the Vaccine Adverse Event Reporting System (VAERS). Your doctor might file this report, or you can do it yourself  through the VAERS websiteExternal, or by calling [phone]. If you have any questions about side effects from Shingrix, talk with your doctor. The shingles vaccine does not contain thimerosal (a preservative containing mercury). Top of Page  When Should I See a Doctor Because of the Side Effects I Experience From Shingrix? In clinical trials, Shingrix was not associated with serious adverse events. In fact, serious side effects from vaccines are extremely rare. For example, for every 1 million doses of a vaccine given, only one or two people may have a severe allergic reaction. Signs of an allergic reaction happen within minutes or hours after vaccination and include hives, swelling of the face and throat, difficulty breathing, a fast heartbeat, dizziness, or weakness. If you experience these or any other life-threatening symptoms, see a doctor right away. Shingrix causes a strong response in your immune system, so it may produce short-term side effects more intense than you are used to from other vaccines. These side effects can be uncomfortable, but they are expected and usually go away on their own in 2 or 3 days. Top of Page  How Can I Pay For Shingrix? There are several ways shingles vaccine may be paid for: Medicare . Medicare Part D plans cover the shingles vaccine, but there may be a cost to you depending on your plan. There may be a copay for the vaccine, or you may need to pay in full then get reimbursed for a certain amount.  . Medicare Part B does not cover the shingles vaccine. Medicaid . Medicaid may or may not cover the vaccine. Contact your insurer to find out. Private health insurance . Many private health insurance plans will cover the vaccine, but there may be a cost to you depending on your plan. Contact your insurer to find out. Vaccine assistance programs . Some pharmaceutical companies provide vaccines to eligible adults who cannot afford them. You may want to check with  the vaccine manufacturer, [employer], about Shingrix. If you do not currently have health insurance, learn more about affordable health coverage optionsExternal. To find doctor's offices or pharmacies near you that offer the vaccine, visit HealthMap Vaccine FinderExternal.

## 2020-06-15 ENCOUNTER — Encounter: Payer: Self-pay | Admitting: Physician Assistant

## 2020-06-15 LAB — URINALYSIS, ROUTINE W REFLEX MICROSCOPIC
Bilirubin Urine: NEGATIVE
Glucose, UA: NEGATIVE
Hgb urine dipstick: NEGATIVE
Ketones, ur: NEGATIVE
Leukocytes,Ua: NEGATIVE
Nitrite: NEGATIVE
Protein, ur: NEGATIVE
Specific Gravity, Urine: 1.004 (ref 1.001–1.03)
pH: 6 (ref 5.0–8.0)

## 2020-06-15 LAB — COMPLETE METABOLIC PANEL WITH GFR
AG Ratio: 1.7 (calc) (ref 1.0–2.5)
ALT: 24 U/L (ref 6–29)
AST: 24 U/L (ref 10–35)
Albumin: 4.3 g/dL (ref 3.6–5.1)
Alkaline phosphatase (APISO): 74 U/L (ref 37–153)
BUN: 12 mg/dL (ref 7–25)
CO2: 27 mmol/L (ref 20–32)
Calcium: 10.1 mg/dL (ref 8.6–10.4)
Chloride: 100 mmol/L (ref 98–110)
Creat: 0.9 mg/dL (ref 0.60–0.93)
GFR, Est African American: 75 mL/min/{1.73_m2} (ref >=60)
GFR, Est Non African American: 65 mL/min/{1.73_m2} (ref >=60)
Globulin: 2.5 g/dL (calc) (ref 1.9–3.7)
Glucose, Bld: 83 mg/dL (ref 65–99)
Potassium: 4.2 mmol/L (ref 3.5–5.3)
Sodium: 136 mmol/L (ref 135–146)
Total Bilirubin: 0.6 mg/dL (ref 0.2–1.2)
Total Protein: 6.8 g/dL (ref 6.1–8.1)

## 2020-06-15 LAB — CBC WITH DIFFERENTIAL/PLATELET
Absolute Monocytes: 695 cells/uL (ref 200–950)
Basophils Absolute: 57 cells/uL (ref 0–200)
Basophils Relative: 0.5 %
Eosinophils Absolute: 205 cells/uL (ref 15–500)
Eosinophils Relative: 1.8 %
HCT: 44.7 % (ref 35.0–45.0)
Hemoglobin: 14.4 g/dL (ref 11.7–15.5)
Lymphs Abs: 3614 cells/uL (ref 850–3900)
MCH: 28.6 pg (ref 27.0–33.0)
MCHC: 32.2 g/dL (ref 32.0–36.0)
MCV: 88.7 fL (ref 80.0–100.0)
MPV: 10.7 fL (ref 7.5–12.5)
Monocytes Relative: 6.1 %
Neutro Abs: 6829 cells/uL (ref 1500–7800)
Neutrophils Relative %: 59.9 %
Platelets: 297 10*3/uL (ref 140–400)
RBC: 5.04 10*6/uL (ref 3.80–5.10)
RDW: 13 % (ref 11.0–15.0)
Total Lymphocyte: 31.7 %
WBC: 11.4 10*3/uL — ABNORMAL HIGH (ref 3.8–10.8)

## 2020-06-15 LAB — IRON, TOTAL/TOTAL IRON BINDING CAP
%SAT: 14 % (calc) — ABNORMAL LOW (ref 16–45)
Iron: 64 ug/dL (ref 45–160)
TIBC: 443 mcg/dL (calc) (ref 250–450)

## 2020-06-15 LAB — MICROALBUMIN / CREATININE URINE RATIO
Creatinine, Urine: 19 mg/dL — ABNORMAL LOW (ref 20–275)
Microalb, Ur: <0.2 mg/dL

## 2020-06-15 LAB — LIPID PANEL
Cholesterol: 100 mg/dL (ref <200)
HDL: 43 mg/dL — ABNORMAL LOW (ref >=50)
LDL Cholesterol (Calc): 34 mg/dL (calc)
Non-HDL Cholesterol (Calc): 57 mg/dL (calc) (ref <130)
Total CHOL/HDL Ratio: 2.3 (calc) (ref <5.0)
Triglycerides: 155 mg/dL — ABNORMAL HIGH (ref <150)

## 2020-06-15 LAB — HEMOGLOBIN A1C
Hgb A1c MFr Bld: 6.5 % of total Hgb — ABNORMAL HIGH (ref <5.7)
Mean Plasma Glucose: 140 (calc)
eAG (mmol/L): 7.7 (calc)

## 2020-06-15 LAB — VITAMIN D 25 HYDROXY (VIT D DEFICIENCY, FRACTURES): Vit D, 25-Hydroxy: 142 ng/mL — ABNORMAL HIGH (ref 30–100)

## 2020-06-15 LAB — MAGNESIUM: Magnesium: 1.9 mg/dL (ref 1.5–2.5)

## 2020-06-15 LAB — VITAMIN B12: Vitamin B-12: 287 pg/mL (ref 200–1100)

## 2020-06-15 LAB — FERRITIN: Ferritin: 22 ng/mL (ref 16–288)

## 2020-06-15 LAB — TSH: TSH: 0.77 mIU/L (ref 0.40–4.50)

## 2020-06-23 ENCOUNTER — Ambulatory Visit (INDEPENDENT_AMBULATORY_CARE_PROVIDER_SITE_OTHER): Payer: Medicare HMO | Admitting: *Deleted

## 2020-06-23 DIAGNOSIS — J309 Allergic rhinitis, unspecified: Secondary | ICD-10-CM | POA: Diagnosis not present

## 2020-06-28 ENCOUNTER — Telehealth: Payer: Self-pay | Admitting: Cardiology

## 2020-06-28 NOTE — Telephone Encounter (Signed)
lvm for patient to return call to get follow up scheduled with Gardiner Rhyme from recall list

## 2020-07-06 ENCOUNTER — Ambulatory Visit (INDEPENDENT_AMBULATORY_CARE_PROVIDER_SITE_OTHER): Payer: Medicare HMO

## 2020-07-06 DIAGNOSIS — J309 Allergic rhinitis, unspecified: Secondary | ICD-10-CM

## 2020-07-09 ENCOUNTER — Other Ambulatory Visit: Payer: Self-pay | Admitting: Physician Assistant

## 2020-07-09 DIAGNOSIS — F3340 Major depressive disorder, recurrent, in remission, unspecified: Secondary | ICD-10-CM

## 2020-07-09 DIAGNOSIS — R5383 Other fatigue: Secondary | ICD-10-CM

## 2020-07-13 DIAGNOSIS — E119 Type 2 diabetes mellitus without complications: Secondary | ICD-10-CM | POA: Diagnosis not present

## 2020-07-13 LAB — HM DIABETES EYE EXAM

## 2020-07-16 ENCOUNTER — Ambulatory Visit (INDEPENDENT_AMBULATORY_CARE_PROVIDER_SITE_OTHER): Payer: Medicare HMO | Admitting: *Deleted

## 2020-07-16 DIAGNOSIS — J309 Allergic rhinitis, unspecified: Secondary | ICD-10-CM | POA: Diagnosis not present

## 2020-07-19 ENCOUNTER — Other Ambulatory Visit: Payer: Self-pay | Admitting: Physician Assistant

## 2020-07-19 DIAGNOSIS — N183 Chronic kidney disease, stage 3 unspecified: Secondary | ICD-10-CM

## 2020-07-19 NOTE — Progress Notes (Signed)
Subjective:    Patient ID: Grace Barry, female    DOB: 25-Aug-1951, 69 y.o.   MRN: [id number]  HPI 69 y.o. WF presents for follow up from last OV. She has had fatigue and depression. She was switched from paxil to cymbalta last visit and has started on ozempic for her DM  She states she has less pain and her mood has been better.  She has lost about 8 lbs from last visit.   BMI is Body mass index is 36.85 kg/m., she is working on diet and exercise. Wt Readings from Last 3 Encounters:  07/20/20 208 lb (94.3 kg)  07/20/20 207 lb (93.9 kg)  06/14/20 214 lb (97.1 kg)   Lab Results  Component Value Date   VITAMINB12 287 06/14/2020   Lab Results  Component Value Date   HGBA1C 6.5 (H) 06/14/2020    Blood pressure 126/72, pulse 67, temperature (!) 97.5 F (36.4 C), weight 208 lb (94.3 kg), SpO2 93 %.  Medications  Current Outpatient Medications (Endocrine & Metabolic):  .  metFORMIN (GLUCOPHAGE-XR) 500 MG 24 hr tablet, Take 2 tablets 2 x /day with a meal for Diabetes .  SYNTHROID 200 MCG tablet, TAKE 1 TABLET DAILY ON AN EMPTY STOMACH WITH ONLY WATER FOR 30 MINUTES & NO ANTACID MEDS, CALCIUM OR MAGNESIUM FOR 4 HOURS & AVOID BIOTIN .  Semaglutide, 1 MG/DOSE, (OZEMPIC, 1 MG/DOSE,) 2 MG/1.5ML SOPN, Inject 1 mg into the skin once a week.  Current Outpatient Medications (Cardiovascular):  Marland Kitchen  EPINEPHrine (EPIPEN 2-PAK) 0.3 mg/0.3 mL IJ SOAJ injection, Inject 0.3 mg into the muscle once. Marland Kitchen  losartan (COZAAR) 100 MG tablet, TAKE 1 TABLET DAILY FOR BP AND KIDNEY PROTECTION .  metoprolol succinate (TOPROL-XL) 50 MG 24 hr tablet, TAKE 1 TABLET (50 MG TOTAL) BY MOUTH DAILY. TAKE WITH OR IMMEDIATELY FOLLOWING A MEAL. .  rosuvastatin (CRESTOR) 20 MG tablet, TAKE 1 TABLET BY MOUTH EVERY DAY FOR CHOLESTEROL   Current Outpatient Medications (Analgesics):  .  aspirin 81 MG tablet, Take 81 mg by mouth daily.  Current Outpatient Medications (Hematological):  Marland Kitchen  Cyanocobalamin (VITAMIN B12 SL),  Place 1 tablet under the tongue daily.  Current Outpatient Medications (Other):  .  baclofen (LIORESAL) 10 MG tablet, Take 1 tablet (10 mg total) by mouth 2 (two) times daily as needed for muscle spasms. Marland Kitchen  CALCIUM PO, Take 1,200 mg by mouth daily. .  famotidine (PEPCID) 40 MG tablet, TAKE 1 TABLET BY MOUTH TWICE A DAY. ( REPLACING ZANTAC/RANITIDINE) .  glucose blood (ONE TOUCH ULTRA TEST) test strip, Check Blood sugar Daily for diabetes .  Lancets (ONETOUCH ULTRASOFT) lancets, Check Blood sugar  daily .  Magnesium 250 MG TABS, Take 250 mg by mouth 2 (two) times daily. .  Multiple Vitamins-Minerals (MULTIVITAMIN PO), Take 1 tablet by mouth daily.  .  Omega-3 Fatty Acids (FISH OIL PO), Take by mouth daily. Marland Kitchen  PRESCRIPTION MEDICATION, Inject as directed every 30 (thirty) days. Allergy Shot .  Turmeric (QC TUMERIC COMPLEX PO), Take by mouth. .  Vitamin D, Ergocalciferol, (DRISDOL) 1.25 MG (50000 UNIT) CAPS capsule, TAKE 1 CAPSULE 4 X /WEEK FOR VIT D DEFICIENCY (Patient taking differently: Take 1 capsule 2 x /week for Vit D Deficiency) .  DULoxetine (CYMBALTA) 60 MG capsule, Take 1 capsule (60 mg total) by mouth daily.  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    Review of Systems See HPI    Objective:   Physical Exam General appearance: alert, no distress, WD/WN,  female HEENT: normocephalic, sclerae anicteric, TMs normal, no erythema, burlging.  nares patent, no discharge or erythema, pharynx normal Oral cavity: MMM, no lesions Neck: supple, no lymphadenopathy, no thyromegaly Heart: RRR, normal S1, S2, no murmurs Lungs: CTA bilaterally, no wheezes, rhonchi, or rales Abdomen: +bs, soft, non tender, non distended, no masses, no hepatomegaly, no splenomegaly Musculoskeletal: nontender, no swelling, no obvious deformity Extremities: no edema, no cyanosis, no clubbing Pulses: 2+ symmetric, upper and lower extremities, normal cap refill Neurological: alert, oriented x 3, CN2-12 intact, strength normal upper extremities and lower extremities, sensation normal throughout, DTRs 2+ throughout, no cerebellar signs, gait normal Psychiatric: normal affect, behavior normal, pleasant       Assessment & Plan:  Grace Barry was seen today for follow-up.  Diagnoses and all orders for this visit:  Depression, major, recurrent, in remission ([place]) -     DULoxetine (CYMBALTA) 60 MG capsule; Take 1 capsule (60 mg total) by mouth daily. Will increase to 60 mg daily  Fatigue, unspecified type Continue to follow up with neuro about CPAP Continue cymbalta  Type 2 diabetes mellitus with stage 3 chronic kidney disease, without long-term current use of insulin ([facility]) -     Semaglutide, 1 MG/DOSE, (OZEMPIC, 1 MG/DOSE,) 2 MG/1.5ML SOPN; Inject 1 mg into the skin once a week. Increase to the 1 mg Close follow up 3 months   Future Appointments  Date Time Provider [place]  07/28/2020 11:15 AM Suzzanne Cloud, NP [facility] None  09/20/2020  1:20 PM Donato Heinz, MD  [facility]  09/21/2020  2:30 PM Liane Comber, NP [facility] None  10/08/2020 11:00 AM Festus Aloe, MD [facility] None  11/16/2020  2:30 PM Valentina Shaggy, MD [facility] None  12/22/2020  3:30 PM Garnet Sierras, NP [facility] None  06/22/2021  2:00 PM Garnet Sierras, NP [facility] None

## 2020-07-20 ENCOUNTER — Ambulatory Visit (INDEPENDENT_AMBULATORY_CARE_PROVIDER_SITE_OTHER): Payer: Medicare HMO | Admitting: Neurology

## 2020-07-20 ENCOUNTER — Ambulatory Visit (INDEPENDENT_AMBULATORY_CARE_PROVIDER_SITE_OTHER): Payer: Medicare HMO | Admitting: Physician Assistant

## 2020-07-20 ENCOUNTER — Encounter: Payer: Self-pay | Admitting: Neurology

## 2020-07-20 ENCOUNTER — Other Ambulatory Visit: Payer: Self-pay

## 2020-07-20 ENCOUNTER — Encounter: Payer: Self-pay | Admitting: Physician Assistant

## 2020-07-20 VITALS — BP 149/87 | HR 100 | Ht 63.0 in | Wt 207.0 lb

## 2020-07-20 DIAGNOSIS — F329 Major depressive disorder, single episode, unspecified: Secondary | ICD-10-CM

## 2020-07-20 DIAGNOSIS — G4753 Recurrent isolated sleep paralysis: Secondary | ICD-10-CM

## 2020-07-20 DIAGNOSIS — F5104 Psychophysiologic insomnia: Secondary | ICD-10-CM

## 2020-07-20 DIAGNOSIS — F3340 Major depressive disorder, recurrent, in remission, unspecified: Secondary | ICD-10-CM | POA: Diagnosis not present

## 2020-07-20 DIAGNOSIS — G4752 REM sleep behavior disorder: Secondary | ICD-10-CM | POA: Insufficient documentation

## 2020-07-20 DIAGNOSIS — R519 Headache, unspecified: Secondary | ICD-10-CM | POA: Insufficient documentation

## 2020-07-20 DIAGNOSIS — G4733 Obstructive sleep apnea (adult) (pediatric): Secondary | ICD-10-CM

## 2020-07-20 DIAGNOSIS — E1122 Type 2 diabetes mellitus with diabetic chronic kidney disease: Secondary | ICD-10-CM

## 2020-07-20 DIAGNOSIS — F515 Nightmare disorder: Secondary | ICD-10-CM | POA: Insufficient documentation

## 2020-07-20 DIAGNOSIS — G9332 Myalgic encephalomyelitis/chronic fatigue syndrome: Secondary | ICD-10-CM | POA: Insufficient documentation

## 2020-07-20 DIAGNOSIS — F32A Depression, unspecified: Secondary | ICD-10-CM | POA: Insufficient documentation

## 2020-07-20 DIAGNOSIS — N183 Chronic kidney disease, stage 3 unspecified: Secondary | ICD-10-CM

## 2020-07-20 DIAGNOSIS — R5383 Other fatigue: Secondary | ICD-10-CM

## 2020-07-20 DIAGNOSIS — R69 Illness, unspecified: Secondary | ICD-10-CM | POA: Diagnosis not present

## 2020-07-20 MED ORDER — OZEMPIC (1 MG/DOSE) 2 MG/1.5ML ~~LOC~~ SOPN: 1.0000 mg | PEN_INJECTOR | SUBCUTANEOUS | 3 refills | Status: DC

## 2020-07-20 MED ORDER — DULOXETINE HCL 60 MG PO CPEP: 60.0000 mg | ORAL_CAPSULE | Freq: Every day | ORAL | 1 refills | Status: DC

## 2020-07-20 NOTE — Patient Instructions (Signed)

## 2020-07-20 NOTE — Progress Notes (Signed)
SLEEP MEDICINE CLINIC    Provider:  Larey Seat, MD  Primary Care Physician:  Unk Pinto, MD [address] [place] [address]     Referring Provider: Unk Pinto, [place],  [place] [address]          Chief Complaint according to patient   Patient presents with:     New Patient (Initial Visit)           HISTORY OF PRESENT ILLNESS:  Grace Barry is a 69 year old  Caucasian female patient seen here upon a referral on 07/20/2020 from Dr Melford Aase, MD.  I have the pleasure of seeing Grace Barry today, a right -handed White or Caucasian female with a chronic fatigue and insomnia  sleep disorder.  She has a past medical history of Allergy, Anxiety, Arthritis, Asthma, Cataract, Chronic kidney disease (CKD), stage II (mild) (07/01/2014), Depression, Fibromyalgia, GERD (gastroesophageal reflux disease), Hyperlipidemia, Hypertension, IBS (irritable bowel syndrome), Liver disease, Migraine (02/19/2015), Migraines, Obesity, Osteoporosis, Sleep apnea, Sleep difficulties, Thyroid disease, Tuberculosis, Type II or unspecified type diabetes mellitus without mention of complication, not stated as uncontrolled, Urticaria, Ventral hernia, and Vertigo (02/19/2015).   Chief concern according to patient : Mrs. Schickling describes having undergone a sleep study with Dr. Concha Pyo at [facility] about 15 years ago, she was diagnosed with obstructive sleep apnea at the time was issued a CPAP which she used until about 3 or 4 years ago.  She then was referred for an oral appliance to Dr. Augustina Mood she also lost about 40 pounds and that is time interval and she states that with the oral appliance snoring is resolved and her apnea as seen on home sleep test seem to have improved significantly.  This did not affect the degree of daytime sleepiness or fatigue or her problems with insomnia.  She also experiences some symptoms that may relate to  restless legs.  Sleep paralysis, night terrors and nightmares.    She has been declared vitamin D and vitamin B12 deficient in the past both are supplemented.  And her history above includes hypothyroidism.  Diabetes, hypertension, migraine or chronic migrainous headaches which are followed by Dr. Floyde Parkins.  Her past surgical history includes a tonsillectomy from 1955 wisdom tooth extractions in the 1970s 2 C-sections in 1979 in 1981 tubal ligation in 1982, and arthroscopy in 1999 and 2010 a cholecystectomy about 15 years ago carpal tunnel repair in 1994 9095 both sides, left and right total knee replacement in 2011 sphenoid sinus surgery about 20 years ago and a hernia repair somewhere in the last 10 years and hysterectomy.       Sleep relevant medical history: Night terrors, other Parasomnia, Tonsillectomy.   Family medical /sleep history:No  other family member on CPAP with OSA, insomnia, sleep walkers.    Social history:  Patient is retired from [employer] , all grades.  and lives in a household with spouse  . 2 children, one deceased.   Pets are  Present.one dog and one cat.  Tobacco use :none .  ETOH use - seldomly,Caffeine intake in form of Soda( 1/ month ). Regular exercise - none .   Hobbies :none       Sleep habits are as follows: The patient's dinner time is between 7 PM. The patient goes to bed at 11.30 PM.  Initiating sleep has been difficult- eve since 6 years ago- her son was very ill, died of  cancer. She continues to sleep for 2-3  hours, wakes for bathroom breaks, the first time at 3-4 AM.   The preferred sleep position is on the side  , with the support of 1 pillow.  Adjustable bed, 12 degrees. Dreams are reportedly frequent/vivid.  11AM is the usual rise time. The patient wakes up spontaneously at 8 AM and finds it hard to get up. .  She reports not feeling refreshed or restored in AM, with symptoms such as dry mouth , morning headaches , and residual fatigue.  Naps are  taken frequently, lasting from 1-2 hours and are not more or less refreshing than nocturnal sleep.    Review of Systems: Out of a complete 14 system review, the patient complains of only the following symptoms, and all other reviewed systems are negative.:  Fatigue, sleepiness , snoring, fragmented sleep, Insomnia - poor sleep hygiene, long naps, long sleep latency. Bedroom is cool, quiet, not dark. Needs black out curtains.  Grief counseling.    How likely are you to doze in the following situations: 0 = not likely, 1 = slight [place], 2 = moderate [place], 3 = high St[place]   Sitting and Reading? Watching Television? Sitting inactive in a public place (theater or meeting)? As a passenger in a car for an hour without a break? Lying down in the afternoon when circumstances permit? Sitting and talking to someone? Sitting quietly after lunch without alcohol? In a car, while stopped for a few minutes in traffic?   Total = 14r/ 24 points   FSS endorsed at 49/ 63 points.   Social History   Socioeconomic History   Marital status: Married    Spouse name: Francee Piccolo   Number of children: 2   Years of education: masters   Highest education level: Not on file  Occupational History   Occupation: Retired Pharmacist, hospital  Tobacco Use   Smoking status: Never Smoker   Smokeless tobacco: Never Used  Scientific laboratory technician Use: Never used  Substance and Sexual Activity   Alcohol use: Yes    Alcohol/week: 0.0 standard drinks    Comment: occasional   Drug use: No   Sexual activity: Yes  Other Topics Concern   Not on file  Social History Narrative   Lives with husband, Francee Piccolo   Patient is right handed.   Patient drinks 64 oz of diet soda daily.   Social Determinants of Health   Financial Resource Strain:    Difficulty of Paying Living Expenses: Not on file  Food Insecurity:    Worried About Charity fundraiser in the Last Year: Not on file   [employer] of Food in the Last Year: Not on file   Transportation Needs:    Lack of Transportation (Medical): Not on file   Lack of Transportation (Non-Medical): Not on file  Physical Activity:    Days of Exercise per Week: Not on file   Minutes of Exercise per Session: Not on file  Stress:    Feeling of Stress : Not on file  Social Connections:    Frequency of Communication with Friends and Family: Not on file   Frequency of Social Gatherings with Friends and Family: Not on file   Attends Religious Services: Not on file   Active Member of Clubs or Organizations: Not on file   Attends Archivist Meetings: Not on file   Marital Status: Not on file    Family History  Problem Relation Age of Onset  Breast cancer Mother    Hypertension Father    Hyperlipidemia Father    Prostate cancer Father    Hyperlipidemia Sister    Esophageal cancer Son 89       Died 12/01/2013   Colon cancer Son    Stomach cancer Son    Colon polyps Neg Hx    Diabetes Neg Hx    Kidney disease Neg Hx    Migraines Neg Hx    Rectal cancer Neg Hx     Past Medical History:  Diagnosis Date   Allergy    Anxiety    Arthritis    Asthma    Cataract    Chronic kidney disease (CKD), stage II (mild) 07/01/2014   Overview:  Overview:  Due to DM, last GFR 64    Depression    Fibromyalgia    GERD (gastroesophageal reflux disease)    Hyperlipidemia    Hypertension    IBS (irritable bowel syndrome)    Liver disease    Migraine 02/19/2015   Migraines    Obesity    Osteoporosis    Sleep apnea    uses oral device   Sleep difficulties    Thyroid disease    hypothyroidism   Tuberculosis    positive PPD test, chest x ray negative   Type II or unspecified type diabetes mellitus without mention of complication, not stated as uncontrolled    Urticaria    Ventral hernia    Vertigo 02/19/2015    Past Surgical History:  Procedure Laterality Date   ABDOMINAL HYSTERECTOMY     ADENOIDECTOMY     CARPAL  TUNNEL RELEASE     bilateral   CATARACT EXTRACTION, BILATERAL     CESAREAN SECTION     x2   CHOLECYSTECTOMY     COLONOSCOPY     HERNIA REPAIR  07/24/11   ventral hernia   JOINT REPLACEMENT     bilateral knees   KNEE ARTHROSCOPY     bilateral   SINOSCOPY     TONSILLECTOMY     TOTAL KNEE ARTHROPLASTY     bilateral     Current Outpatient Medications on File Prior to Visit  Medication Sig Dispense Refill   aspirin 81 MG tablet Take 81 mg by mouth daily.     baclofen (LIORESAL) 10 MG tablet Take 1 tablet (10 mg total) by mouth 2 (two) times daily as needed for muscle spasms. 30 each 0   CALCIUM PO Take 1,200 mg by mouth daily.     Cyanocobalamin (VITAMIN B12 SL) Place 1 tablet under the tongue daily.     DULoxetine (CYMBALTA) 30 MG capsule TAKE 1 CAPSULE BY MOUTH EVERY DAY 90 capsule 1   EPINEPHrine (EPIPEN 2-PAK) 0.3 mg/0.3 mL IJ SOAJ injection Inject 0.3 mg into the muscle once.     famotidine (PEPCID) 40 MG tablet TAKE 1 TABLET BY MOUTH TWICE A DAY. ( REPLACING ZANTAC/RANITIDINE) 180 tablet 1   glucose blood (ONE TOUCH ULTRA TEST) test strip Check Blood sugar Daily for diabetes 100 each 3   Lancets (ONETOUCH ULTRASOFT) lancets Check Blood sugar  daily 100 each 3   losartan (COZAAR) 100 MG tablet TAKE 1 TABLET DAILY FOR BP AND KIDNEY PROTECTION 90 tablet 3   Magnesium 250 MG TABS Take 250 mg by mouth 2 (two) times daily.     metFORMIN (GLUCOPHAGE-XR) 500 MG 24 hr tablet Take 2 tablets 2 x /day with a meal for Diabetes 360 tablet 3   metoprolol  succinate (TOPROL-XL) 50 MG 24 hr tablet TAKE 1 TABLET (50 MG TOTAL) BY MOUTH DAILY. TAKE WITH OR IMMEDIATELY FOLLOWING A MEAL. 90 tablet 0   Multiple Vitamins-Minerals (MULTIVITAMIN PO) Take 1 tablet by mouth daily.      Omega-3 Fatty Acids (FISH OIL PO) Take by mouth daily.     OZEMPIC, 0.25 OR 0.5 MG/DOSE, 2 MG/1.5ML SOPN INJECT 0.5 MG INTO THE SKIN ONCE A WEEK. 1.5 mL 3   PRESCRIPTION MEDICATION Inject as directed  every 30 (thirty) days. Allergy Shot     rosuvastatin (CRESTOR) 20 MG tablet TAKE 1 TABLET BY MOUTH EVERY DAY FOR CHOLESTEROL 90 tablet 1   SYNTHROID 200 MCG tablet TAKE 1 TABLET DAILY ON AN EMPTY STOMACH WITH ONLY WATER FOR 30 MINUTES & NO ANTACID MEDS, CALCIUM OR MAGNESIUM FOR 4 HOURS & AVOID BIOTIN 90 tablet 3   Turmeric (QC TUMERIC COMPLEX PO) Take by mouth.     Vitamin D, Ergocalciferol, (DRISDOL) 1.25 MG (50000 UNIT) CAPS capsule TAKE 1 CAPSULE 4 X /WEEK FOR VIT D DEFICIENCY (Patient taking differently: Take 1 capsule 2 x /week for Vit D Deficiency) 48 capsule 1   No current facility-administered medications on file prior to visit.    Allergies  Allergen Reactions   Floxin [Ofloxacin] Hives   Invokana [Canagliflozin]     Hematuria- avoid class   Viberzi [Eluxadoline] Hives   Doxycycline Rash   Penicillins Rash    Physical exam:  Today's Vitals   07/20/20 1013  BP: (!) 149/87  Pulse: 100  Weight: 207 lb (93.9 kg)  Height: 5\' 3"  (1.6 m)   Body mass index is 36.67 kg/m.   Wt Readings from Last 3 Encounters:  07/20/20 207 lb (93.9 kg)  06/14/20 214 lb (97.1 kg)  05/21/20 (!) 211 lb 9.6 oz (96 kg)     Ht Readings from Last 3 Encounters:  07/20/20 5\' 3"  (1.6 m)  06/14/20 5' 3.5" (1.613 m)  05/21/20 5\' 3"  (1.6 m)      General: The patient is awake, alert and appears not in acute distress. The patient is well groomed. Head: Normocephalic, atraumatic. Neck is supple. Mallampati 2,  neck circumference:16 inches . Nasal airflow patent.  Retrognathia is not seen.  Dental status: intact  Cardiovascular:  Regular rate and cardiac rhythm by pulse,  without distended neck veins. Respiratory: Lungs are clear to auscultation.  Skin:  Without evidence of ankle edema, or rash. Tattooed on the left chestwall.  Trunk: The patient's posture is erect.   Neurologic exam : The patient is awake and alert, oriented to place and time.   Memory subjective described as intact.    Attention span & concentration ability appears normal.  Speech is fluent,  without  dysarthria, dysphonia or aphasia.  Mood and affect are appropriate.   Cranial nerves: no loss of smell or taste reported -  No covid history- fully vaccinated,  Pupils are equal and briskly reactive to light. Funduscopic exam deferred.  Status post cataract . Extraocular movements in vertical and horizontal planes were intact and without nystagmus. No Diplopia. Visual fields by finger perimetry are intact. Hearing was impaired in the left ear.   Facial sensation intact to fine touch. Facial motor strength is symmetric and tongue and uvula move midline.  Neck ROM : rotation, tilt and flexion extension were normal for age and shoulder shrug was symmetrical.    Motor exam:  Symmetric bulk, tone and ROM.   Normal tone without cog wheeling, symmetric  grip strength .   Sensory:  Fine touch, pinprick and vibration were tested  and  normal.  Proprioception tested in the upper extremities was normal.   Coordination: Rapid alternating movements in the fingers/hands were of normal speed.  The Finger-to-nose maneuver was intact without evidence of ataxia, dysmetria or tremor.   Gait and station: Patient could rise unassisted from a seated position, walked without assistive device.  Stance is of normal width/ base and the patient turned with 3 steps.  Toe and heel walk were deferred.  Deep tendon reflexes: in the  upper and lower extremities are symmetric and intact.  Babinski response was deferred.        After spending a total time of 60 minutes face to face andfor physical and neurologic examination, review of laboratory studies,  personal review of imaging studies, reports and results of other testing and review of referral information / records as far as provided in visit, I have established the following assessments:  1) obstructive sleep apnea by history, for many years she had worn a CPAP then switched  to a dental device.  She lost a significant amount of weight and may actually have a rather mild form of apnea not to begin with.  There has been no baseline study for a while if I cannot get an attended sleep study I would rather prefer a home or a home sleep test to be divided in a part with and one without dental device use.  I will asked the patient to indicate that when she brings her device back to [place] and what time she actually switched.  I doubt that treatment and optimization of apnea treatment affects fatigue or insomnia at this time.    2) there is a high degree of fatigue and also a very high degree of daytime sleepiness.  If her sleep apnea test does not reveal this to be a cause I think we may have to look at improving her depression and anxiety treatment.  She has been experiencing night terrors nightmares and sleep paralysis vivid dreams but no sleep attacks. We will discuss in detail some of the sleep hygiene issues, the need to keep the bedroom darker, quiet and cool.  I would like her to turn around the clock so that she is not counting down on her sleep hours.  I also like to install a routine routine bedtime, no electronics for at least 30 minutes before she enters the bedroom.  A hot bath or shower before bedtime.  I also like to eliminate the daytime naps as much as possible therefore hopefully compensating nocturnal sleep.  She will receive a brochure that will further explain the steps. 3) the patient can correlate her experience of insomnia to the time when her son fell ill and finally passed away of cancer.  This is a protracted grieving reaction that she see in sleep medicine a lot it is a form of increased anxiety, hypervigilance and racing mind and other stimuli have fallen away.  The proper treatment is usually with antidepressants.  The patient has undergone grief counseling through hospice which was helpful however her sleep patterns have remained affected.  She is on Cymbalta she  has used Paxil in the past which is a very hard medication to wean.  She is off Paxil now.  I would much rather see an SSRI in its place, Cymbalta is main benefit is that it also has a pain reducing quality and for this reason the  patient felt that has helped her more.  Phentermine can affect sleep pattern Topamax can affect sleep pattern she has weaned off of those medications she is currently on Ozempic.   My Plan is to proceed with:  1)i prefer PSG for vivid dreams and night mares, and some RLS component. This test can in addition give information about sleep apnea. She reports acting out dreams, not sleep walking.   2) HST can help to determinate the effect of dental device.  3)  Depression treatment and cognitive behaviour therapy should help insomnia. .   I would like to thank Unk Pinto, MD and Unk Pinto, [place],  [place] [address] for allowing me to meet with and to take care of this pleasant patient.   I plan to follow up either personally or through our NP within 3-4 month.   CC: I will share my notes with Dr Jannifer Franklin.  Electronically signed by: Larey Seat, MD 07/20/2020 10:18 AM  [facility] Board certified by The [employer] of Sleep Medicine and Diplomate of the [employer] of Sleep Medicine. Board certified In Neurology through the [place], Fellow of the [employer] of Neurology. Medical Director of [facility].

## 2020-07-21 ENCOUNTER — Telehealth: Payer: Self-pay

## 2020-07-21 NOTE — Telephone Encounter (Signed)
LVM for pt to call me back to schedule sleep study  

## 2020-07-28 ENCOUNTER — Encounter: Payer: Self-pay | Admitting: Neurology

## 2020-07-28 ENCOUNTER — Other Ambulatory Visit: Payer: Self-pay | Admitting: Physician Assistant

## 2020-07-28 ENCOUNTER — Ambulatory Visit (INDEPENDENT_AMBULATORY_CARE_PROVIDER_SITE_OTHER): Payer: Medicare HMO | Admitting: Neurology

## 2020-07-28 VITALS — BP 132/84 | Ht 63.0 in | Wt 206.4 lb

## 2020-07-28 DIAGNOSIS — G43009 Migraine without aura, not intractable, without status migrainosus: Secondary | ICD-10-CM

## 2020-07-28 DIAGNOSIS — R251 Tremor, unspecified: Secondary | ICD-10-CM | POA: Diagnosis not present

## 2020-07-28 MED ORDER — METOPROLOL SUCCINATE ER 50 MG PO TB24: 50.0000 mg | ORAL_TABLET | Freq: Every day | ORAL | 4 refills | Status: DC

## 2020-07-28 MED ORDER — BACLOFEN 10 MG PO TABS: 10.0000 mg | ORAL_TABLET | Freq: Two times a day (BID) | ORAL | 5 refills | Status: DC | PRN

## 2020-07-28 NOTE — Progress Notes (Signed)
I have read the note, and I agree with the clinical assessment and plan.  Uva Runkel K Kristain Filo   

## 2020-07-28 NOTE — Patient Instructions (Signed)
Continue current medications  You will get sleep study  See you back in 1 year

## 2020-07-28 NOTE — Progress Notes (Signed)
PATIENT: Grace Barry DOB: 1951/10/08  REASON FOR VISIT: follow up HISTORY FROM: patient  HISTORY OF PRESENT ILLNESS: Today 07/28/20  [name] is a 69 year old female with history of migraine headache and reported tremor.  She has come off Topamax, on her own, feels memory and cognition is clearer.  Headaches remain well controlled, on metoprolol. For headache, will take baclofen with good benefit.  Tremor in the hands, left greater than the right, intermittent, worse with anxiety, overall stable, doesn't interfere with daily activity.  Recently saw Dr. Brett Fairy for sleep consultation, pending sleep study, due to fatigue.  She wears a dental device.  Is overall doing well.  Presents today for follow-up unaccompanied.  HISTORY 07/28/2019 SS: [name] is a 69 year old female with history of migraine headache and reported tremor.  She remains on Topamax 50 mg daily, and metoprolol XR 50 mg daily.  She indicates this provides excellent control of her migraine headaches.  She says she is having less than 1 headache a month.  If she gets a headache, it may be moderate intensity, but taking her baclofen relieves the headache.  She indicates that her memory decline seems to have improved.  After last visit, she tried to decrease her dose of Topamax due to report of memory decline, but her headaches increased. She is currently undergoing evaluation by cardiology for EKG changes.  She is planning to have a cardiac stress test tomorrow.  She indicates she has been suffering from significant fatigue.  She does report tremor in her hands, left is worse than the right.  This does not impact her daily activity or handwriting.  She notices it worse during times of anxiety or stress.  She says she has had a few stumbles, but no falls. She presents today for follow-up via virtual visit.   REVIEW OF SYSTEMS: Out of a complete 14 system review of symptoms, the patient complains only of the following symptoms,  and all other reviewed systems are negative.  Headache, tremor  ALLERGIES: Allergies  Allergen Reactions  . Floxin [Ofloxacin] Hives  . Invokana [Canagliflozin]     Hematuria- avoid class  . Viberzi [Eluxadoline] Hives  . Doxycycline Rash  . Penicillins Rash    HOME MEDICATIONS: Outpatient Medications Prior to Visit  Medication Sig Dispense Refill  . aspirin 81 MG tablet Take 81 mg by mouth daily.    Marland Kitchen CALCIUM PO Take 1,200 mg by mouth daily.    . Cyanocobalamin (VITAMIN B12 SL) Place 1 tablet under the tongue daily.    . DULoxetine (CYMBALTA) 60 MG capsule Take 1 capsule (60 mg total) by mouth daily. 90 capsule 1  . EPINEPHrine (EPIPEN 2-PAK) 0.3 mg/0.3 mL IJ SOAJ injection Inject 0.3 mg into the muscle once.    . famotidine (PEPCID) 40 MG tablet TAKE 1 TABLET BY MOUTH TWICE A DAY. ( REPLACING ZANTAC/RANITIDINE) 180 tablet 1  . glucose blood (ONE TOUCH ULTRA TEST) test strip Check Blood sugar Daily for diabetes 100 each 3  . Lancets (ONETOUCH ULTRASOFT) lancets Check Blood sugar  daily 100 each 3  . losartan (COZAAR) 100 MG tablet TAKE 1 TABLET DAILY FOR BP AND KIDNEY PROTECTION 90 tablet 3  . Magnesium 250 MG TABS Take 250 mg by mouth 2 (two) times daily.    . metFORMIN (GLUCOPHAGE-XR) 500 MG 24 hr tablet Take 2 tablets 2 x /day with a meal for Diabetes 360 tablet 3  . Multiple Vitamins-Minerals (MULTIVITAMIN PO) Take 1 tablet by mouth  daily.     . Omega-3 Fatty Acids (FISH OIL PO) Take by mouth daily.    Marland Kitchen PRESCRIPTION MEDICATION Inject as directed every 30 (thirty) days. Allergy Shot    . rosuvastatin (CRESTOR) 20 MG tablet TAKE 1 TABLET BY MOUTH EVERY DAY FOR CHOLESTEROL 90 tablet 1  . Semaglutide, 1 MG/DOSE, (OZEMPIC, 1 MG/DOSE,) 2 MG/1.5ML SOPN Inject 1 mg into the skin once a week. 1.5 mL 3  . SYNTHROID 200 MCG tablet TAKE 1 TABLET DAILY ON AN EMPTY STOMACH WITH ONLY WATER FOR 30 MINUTES & NO ANTACID MEDS, CALCIUM OR MAGNESIUM FOR 4 HOURS & AVOID BIOTIN 90 tablet 3  .  Turmeric (QC TUMERIC COMPLEX PO) Take by mouth.    . Vitamin D, Ergocalciferol, (DRISDOL) 1.25 MG (50000 UNIT) CAPS capsule TAKE 1 CAPSULE 4 X /WEEK FOR VIT D DEFICIENCY (Patient taking differently: Take 1 capsule 2 x /week for Vit D Deficiency) 48 capsule 1  . baclofen (LIORESAL) 10 MG tablet Take 1 tablet (10 mg total) by mouth 2 (two) times daily as needed for muscle spasms. 30 each 0  . metoprolol succinate (TOPROL-XL) 50 MG 24 hr tablet TAKE 1 TABLET (50 MG TOTAL) BY MOUTH DAILY. TAKE WITH OR IMMEDIATELY FOLLOWING A MEAL. 90 tablet 0   No facility-administered medications prior to visit.    PAST MEDICAL HISTORY: Past Medical History:  Diagnosis Date  . Allergy   . Anxiety   . Arthritis   . Asthma   . Cataract   . Chronic kidney disease (CKD), stage II (mild) 07/01/2014   Overview:  Overview:  Due to DM, last GFR 64   . Depression   . Fibromyalgia   . GERD (gastroesophageal reflux disease)   . Hyperlipidemia   . Hypertension   . IBS (irritable bowel syndrome)   . Liver disease   . Migraine 02/19/2015  . Migraines   . Obesity   . Osteoporosis   . Sleep apnea    uses oral device  . Sleep difficulties   . Thyroid disease    hypothyroidism  . Tuberculosis    positive PPD test, chest x ray negative  . Type II or unspecified type diabetes mellitus without mention of complication, not stated as uncontrolled   . Urticaria   . Ventral hernia   . Vertigo 02/19/2015    PAST SURGICAL HISTORY: Past Surgical History:  Procedure Laterality Date  . ABDOMINAL HYSTERECTOMY    . ADENOIDECTOMY    . CARPAL TUNNEL RELEASE     bilateral  . CATARACT EXTRACTION, BILATERAL    . CESAREAN SECTION     x2  . CHOLECYSTECTOMY    . COLONOSCOPY    . HERNIA REPAIR  07/24/11   ventral hernia  . JOINT REPLACEMENT     bilateral knees  . KNEE ARTHROSCOPY     bilateral  . SINOSCOPY    . TONSILLECTOMY    . TOTAL KNEE ARTHROPLASTY     bilateral    FAMILY HISTORY: Family History  Problem  Relation Age of Onset  . Breast cancer Mother   . Hypertension Father   . Hyperlipidemia Father   . Prostate cancer Father   . Hyperlipidemia Sister   . Esophageal cancer Son 25       Died 2013/11/27  . Colon cancer Son   . Stomach cancer Son   . Colon polyps Neg Hx   . Diabetes Neg Hx   . Kidney disease Neg Hx   . Migraines Neg  Hx   . Rectal cancer Neg Hx     SOCIAL HISTORY: Social History   Socioeconomic History  . Marital status: Married    Spouse name: Francee Piccolo  . Number of children: 2  . Years of education: masters  . Highest education level: Not on file  Occupational History  . Occupation: Retired Pharmacist, hospital  Tobacco Use  . Smoking status: Never Smoker  . Smokeless tobacco: Never Used  Vaping Use  . Vaping Use: Never used  Substance and Sexual Activity  . Alcohol use: Yes    Alcohol/week: 0.0 standard drinks    Comment: occasional  . Drug use: No  . Sexual activity: Yes  Other Topics Concern  . Not on file  Social History Narrative   Lives with husband, Francee Piccolo   Patient is right handed.   Patient drinks 64 oz of diet soda daily.   Social Determinants of Health   Financial Resource Strain:   . Difficulty of Paying Living Expenses: Not on file  Food Insecurity:   . Worried About Charity fundraiser in the Last Year: Not on file  . Ran Out of Food in the Last Year: Not on file  Transportation Needs:   . Lack of Transportation (Medical): Not on file  . Lack of Transportation (Non-Medical): Not on file  Physical Activity:   . Days of Exercise per Week: Not on file  . Minutes of Exercise per Session: Not on file  Stress:   . Feeling of Stress : Not on file  Social Connections:   . Frequency of Communication with Friends and Family: Not on file  . Frequency of Social Gatherings with Friends and Family: Not on file  . Attends Religious Services: Not on file  . Active Member of Clubs or Organizations: Not on file  . Attends Archivist Meetings: Not on  file  . Marital Status: Not on file  Intimate Partner Violence:   . Fear of Current or Ex-Partner: Not on file  . Emotionally Abused: Not on file  . Physically Abused: Not on file  . Sexually Abused: Not on file   PHYSICAL EXAM  Vitals:   07/28/20 1104  BP: 132/84  Weight: 206 lb 6.4 oz (93.6 kg)  Height: 5\' 3"  (1.6 m)   Body mass index is 36.56 kg/m.  Generalized: Well developed, in no acute distress, HR 110 (we talked about emotional topics during visit) Neurological examination  Mentation: Alert oriented to time, place, history taking. Follows all commands speech and language fluent Cranial nerve II-XII: Pupils were equal round reactive to light. Extraocular movements were full, visual field were full on confrontational test. Facial sensation and strength were normal.  Head turning and shoulder shrug  were normal and symmetric.  No resting tremor noted. Motor: The motor testing reveals 5 over 5 strength of all 4 extremities. Good symmetric motor tone is noted throughout.  Sensory: Sensory testing is intact to soft touch on all 4 extremities. No evidence of extinction is noted.  Coordination: Cerebellar testing reveals good finger-nose-finger and heel-to-shin bilaterally.  No tremor appreciated with finger-nose-finger. Gait and station: Gait is normal. Tandem gait is unsteady. Romberg is negative. No drift is seen.  Reflexes: Deep tendon reflexes are symmetric and normal bilaterally.   DIAGNOSTIC DATA (LABS, IMAGING, TESTING) - I reviewed patient records, labs, notes, testing and imaging myself where available.  Lab Results  Component Value Date   WBC 11.4 (H) 06/14/2020   HGB 14.4 06/14/2020   HCT  44.7 06/14/2020   MCV 88.7 06/14/2020   PLT 297 06/14/2020      Component Value Date/Time   NA 136 06/14/2020 1528   NA 140 07/02/2019 1214   K 4.2 06/14/2020 1528   CL 100 06/14/2020 1528   CO2 27 06/14/2020 1528   GLUCOSE 83 06/14/2020 1528   BUN 12 06/14/2020 1528    BUN 11 07/02/2019 1214   CREATININE 0.90 06/14/2020 1528   CALCIUM 10.1 06/14/2020 1528   PROT 6.8 06/14/2020 1528   ALBUMIN 4.0 03/28/2017 1236   AST 24 06/14/2020 1528   ALT 24 06/14/2020 1528   ALKPHOS 70 03/28/2017 1236   BILITOT 0.6 06/14/2020 1528   GFRNONAA 65 06/14/2020 1528   GFRAA 75 06/14/2020 1528   Lab Results  Component Value Date   CHOL 100 06/14/2020   HDL 43 (L) 06/14/2020   LDLCALC 34 06/14/2020   TRIG 155 (H) 06/14/2020   CHOLHDL 2.3 06/14/2020   Lab Results  Component Value Date   HGBA1C 6.5 (H) 06/14/2020   Lab Results  Component Value Date   VITAMINB12 287 06/14/2020   Lab Results  Component Value Date   TSH 0.77 06/14/2020   ASSESSMENT AND PLAN 69 y.o. year old female  has a past medical history of Allergy, Anxiety, Arthritis, Asthma, Cataract, Chronic kidney disease (CKD), stage II (mild) (07/01/2014), Depression, Fibromyalgia, GERD (gastroesophageal reflux disease), Hyperlipidemia, Hypertension, IBS (irritable bowel syndrome), Liver disease, Migraine (02/19/2015), Migraines, Obesity, Osteoporosis, Sleep apnea, Sleep difficulties, Thyroid disease, Tuberculosis, Type II or unspecified type diabetes mellitus without mention of complication, not stated as uncontrolled, Urticaria, Ventral hernia, and Vertigo (02/19/2015). here with:  1.  Migraine headache 2.  Tremor -Headaches and tremor remained overall stable and well controlled -Was able to come off Topamax without any symptoms worsening -Continue metoprolol XR 50 mg daily -Continue baclofen as needed for acute headache -Saw Dr. Brett Fairy for sleep consultation, pending sleep study -Follow-up in 1 year or sooner if needed  I spent 20 minutes of face-to-face and non-face-to-face time with patient.  This included previsit chart review, lab review, study review, order entry, electronic health record documentation, patient education.  Butler Denmark, AGNP-C, DNP 07/28/2020, 11:39 AM [facility] [address], [place], [place] [address] [phone]

## 2020-08-04 ENCOUNTER — Other Ambulatory Visit: Payer: Self-pay | Admitting: Adult Health

## 2020-08-11 ENCOUNTER — Ambulatory Visit (INDEPENDENT_AMBULATORY_CARE_PROVIDER_SITE_OTHER): Payer: Medicare HMO | Admitting: Neurology

## 2020-08-11 DIAGNOSIS — G4736 Sleep related hypoventilation in conditions classified elsewhere: Secondary | ICD-10-CM

## 2020-08-11 DIAGNOSIS — F515 Nightmare disorder: Secondary | ICD-10-CM

## 2020-08-11 DIAGNOSIS — G4753 Recurrent isolated sleep paralysis: Secondary | ICD-10-CM

## 2020-08-11 DIAGNOSIS — R5383 Other fatigue: Secondary | ICD-10-CM

## 2020-08-11 DIAGNOSIS — R519 Headache, unspecified: Secondary | ICD-10-CM

## 2020-08-11 DIAGNOSIS — G4733 Obstructive sleep apnea (adult) (pediatric): Secondary | ICD-10-CM

## 2020-08-11 DIAGNOSIS — F5104 Psychophysiologic insomnia: Secondary | ICD-10-CM

## 2020-08-11 DIAGNOSIS — R69 Illness, unspecified: Secondary | ICD-10-CM | POA: Diagnosis not present

## 2020-08-11 DIAGNOSIS — F32A Depression, unspecified: Secondary | ICD-10-CM

## 2020-08-17 ENCOUNTER — Ambulatory Visit (INDEPENDENT_AMBULATORY_CARE_PROVIDER_SITE_OTHER): Payer: Medicare HMO

## 2020-08-17 DIAGNOSIS — J309 Allergic rhinitis, unspecified: Secondary | ICD-10-CM | POA: Diagnosis not present

## 2020-09-05 NOTE — Progress Notes (Signed)
Mild, non REM dependent Apnea while using the dental device, also associated with longer 02 desaturation time(?) than the compared 2nd part of this HST without dental device: here no desaturation of clinical significance were calculated ,and the AHI was below 5/h., which would not need treatment.  This is the opposite of what one would expect. I will need to invite the patient to come in for an attended in lab study.  The low oxygen may be cause of fatigue and headaches. If not associated with sleep apnea, she will need referral to pulmonologist.   I also requested her primary physician to refer to cognitive behavior therapy directed at insomnia treatment.

## 2020-09-05 NOTE — Procedures (Signed)
Sleep Study Report   Patient Information     First Name: Grace Last Name: Barry ID: [id number]  Birth Date: 02/20/2051 Age: 69 Gender: Female  Referring Provider: Unk Pinto, MD BMI: 36.7 (W=207 lb, H=5' 3'')  Neck Circ.:  16 '' Epworth:  14/24   Sleep Study Information    Study Date: 08/11/20 S/H/A Version: 08/17/2020 [id number] / [id number] / 79  History:    Mrs. Bogacz is a patient of Dr. Melford Aase and Dr.C.K Jannifer Franklin. 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 The patient is using a dental device (by Dr. Toy Cookey) to treat her OSA/ sleep apnea and is still sleepy in daytime. She reports night terrors, nightmares, Insomnia and dream enactment, possible also RLS.   Summary & Diagnosis:    This study was performed by using the dental device approximately 40 % of the night, until 3.10 AM. The overall AHI for the first part of this HST with dental device was mild at AHI13.6/h and REM AHI was 11.4/h  (with 5.2 central apneas per hours).  The second part of the HST after 3 AM was performed without dental device and the data look very different: here the AHI was 3.5/h and there was hypoxemia: over 25 % of the total HST were spent in desaturation, for a total of 130 minutes!  A larger proportion of the second part were REM sleep, as physiologically expected. REM AHI was 0/h. Recommendations:      I am very surprised to see this "SPLIT HST" data indicate more apnea and hypoxia was present while the patient used her dental device.   I would need to have this patient undergo an  attended sleep study to verify.  Interpreting Physician: Larey Seat, MD           Sleep Summary  Oxygen Saturation Statistics   Start Study Time: End Study Time: Total Recording Time:          11:32:09 PM 9:30:17 AM   9 h, 58 min  Total Sleep Time % REM of Sleep Time:  8 h, 33 min  13.3    Mean: 90 Minimum: 81 Maximum: 96  Mean of Desaturations Nadirs (%):   88  Oxygen Desaturation. %:  4-9 10-20 >20 Total  Events Number Total   30  1 96.8 3.2  0 0.0  31 100.0  Oxygen Saturation: <90 <=88 <85 <80 <70  Duration (minutes): Sleep % 144.9 28.2 61.3 0.0 11.9 0.0 0.0 0.0 0.0 0.0     Respiratory Indices      Total Events REM NREM All Night  pRDI: pAHI 3%: ODI 4%: pAHIc 3%: % CSR: pAHI 4%: 115   90  31  10 0.0 35 13.8 11.0 3.7 1.7 13.5 10.5 3.7 1.3 13.6 10.6 3.7 1.3 4.1       Pulse Rate Statistics during Sleep (BPM)      Mean: 86 Minimum: 66 Maximum: 113    Indices are calculated using technically valid sleep time of 8 h, 29 min.                 pAHI=10.6  Mild              Moderate                    Severe                                                 5              15                    30   Body Position Statistics  Position Supine Prone Right Left Non-Supine  Sleep (min) 139.6 275.5 82.0 16.0 373.5  Sleep % 27.2 53.7 16.0 3.1 72.8  pRDI 29.4 8.4 6.6 0.0 7.6  pAHI 3% 23.8 6.2 5.2 0.0 5.7  ODI 4% 9.1 1.8 1.5 0.0 1.6               Left   Prone  Right  Supine    Snoring Statistics Snoring Level (dB) >40 >50 >60 >70 >80 >Threshold (45)  Sleep (min) 244.4 3.0 1.2 0.0 0.0 8.6  Sleep % 47.6 0.6 0.2 0.0 0.0 1.7    Mean: 41 dB

## 2020-09-05 NOTE — Addendum Note (Signed)
Addended by: Larey Seat on: 09/05/2020 04:27 PM   Modules accepted: Orders

## 2020-09-06 ENCOUNTER — Encounter: Payer: Self-pay | Admitting: Neurology

## 2020-09-06 ENCOUNTER — Telehealth: Payer: Self-pay | Admitting: Neurology

## 2020-09-06 NOTE — Telephone Encounter (Signed)
-----   Message from Larey Seat, MD sent at 09/05/2020  4:27 PM EST ----- Mild, non REM dependent Apnea while using the dental device, also associated with longer 02 desaturation time(?) than the compared 2nd part of this HST without dental device: here no desaturation of clinical significance were calculated ,and the AHI was below 5/h., which would not need treatment.  This is the opposite of what one would expect. I will need to invite the patient to come in for an attended in lab study.  The low oxygen may be cause of fatigue and headaches. If not associated with sleep apnea, she will need referral to pulmonologist.   I also requested her primary physician to refer to cognitive behavior therapy directed at insomnia treatment.

## 2020-09-06 NOTE — Telephone Encounter (Signed)
Called patient to discuss sleep study results. No answer at this time. LVM for the patient to call back.  Ill send a mychart message as well.  

## 2020-09-07 NOTE — Telephone Encounter (Signed)
Pt has called Casey,RN back.  Please call

## 2020-09-07 NOTE — Telephone Encounter (Signed)
Pt returned call and I was able to review the results. She did confirm that the first part of the study was done with the dental device and 2nd part without dental device. Advised that we will attempt to get the in lab covered to reassess the accuracy of the study. She verbalized understanding and had no further questions

## 2020-09-10 ENCOUNTER — Ambulatory Visit: Payer: Self-pay | Admitting: Urology

## 2020-09-14 ENCOUNTER — Ambulatory Visit (INDEPENDENT_AMBULATORY_CARE_PROVIDER_SITE_OTHER): Payer: Medicare HMO

## 2020-09-14 DIAGNOSIS — J309 Allergic rhinitis, unspecified: Secondary | ICD-10-CM

## 2020-09-19 NOTE — Progress Notes (Signed)
Cardiology Office Note:    Date:  09/20/2020   ID:  Grace Barry, DOB [birth year], MRN [id number]  PCP:  Unk Pinto, MD  Cardiologist:  No primary care provider on file.  Electrophysiologist:  None   Referring MD: Unk Pinto, MD   Chief complaint: chest pain  History of Present Illness:    Grace Barry is a 69 y.o. female with a hx of hypertension, hyperlipidemia, OSA, type 2 diabetes, hypothyroidism who presents for follow-up.  She was referred by Vicie Mutters, PA-C for evaluation of EKG abnormalities and chest pain, initially seen on 06/27/2019.  Patient was seen by PCP with plans to start on phentermine for weight loss and an exercise program.  EKG was checked and showed new T wave inversions.  She was referred to cardiology for further evaluation.  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  TTE on 9 1/20 showed no abnormalities.  She underwent a SPECT on 07/29/2019 to evaluate for ischemia given her fistula, and it showed no evidence of ischemia.  Since last clinic visit, she reports that she has been doing well.  She denies any chest pain.  Reports dyspnea only with significant exertion.  She denies any lightheadedness, syncope, lower extremity edema, palpitations.  Reports she has not been exercising regularly.  BP Readings from Last 3 Encounters:  09/20/20 126/83  07/28/20 132/84  07/20/20 126/72   Wt Readings from Last 3 Encounters:  09/20/20 200 lb 12.8 oz (91.1 kg)  07/28/20 206 lb 6.4 oz (93.6 kg)  07/20/20 208 lb (94.3 kg)      Past Medical History:  Diagnosis Date  . Allergy   . Anxiety   . Arthritis   . Asthma   . Cataract   . Chronic kidney disease (CKD), stage II (mild) 07/01/2014   Overview:  Overview:  Due to DM, last GFR 64   . Depression   . Fibromyalgia   . GERD (gastroesophageal reflux disease)   . Hyperlipidemia   . Hypertension   . IBS  (irritable bowel syndrome)   . Liver disease   . Migraine 02/19/2015  . Migraines   . Obesity   . Osteoporosis   . Sleep apnea    uses oral device  . Sleep difficulties   . Thyroid disease    hypothyroidism  . Tuberculosis    positive PPD test, chest x ray negative  . Type II or unspecified type diabetes mellitus without mention of complication, not stated as uncontrolled   . Urticaria   . Ventral hernia   . Vertigo 02/19/2015    Past Surgical History:  Procedure Laterality Date  . ABDOMINAL HYSTERECTOMY    . ADENOIDECTOMY    . CARPAL TUNNEL RELEASE     bilateral  . CATARACT EXTRACTION, BILATERAL    . CESAREAN SECTION     x2  . CHOLECYSTECTOMY    . COLONOSCOPY    . HERNIA REPAIR  07/24/11   ventral hernia  . JOINT REPLACEMENT     bilateral knees  . KNEE ARTHROSCOPY     bilateral  . SINOSCOPY    . TONSILLECTOMY    . TOTAL KNEE ARTHROPLASTY     bilateral    Current Medications: Current Meds  Medication Sig  . aspirin 81 MG tablet Take 81 mg by mouth daily.  . baclofen (LIORESAL) 10 MG tablet Take 1 tablet (10 mg total) by  mouth 2 (two) times daily as needed for muscle spasms.  Marland Kitchen CALCIUM PO Take 1,200 mg by mouth daily.  . Cyanocobalamin (VITAMIN B12 SL) Place 1 tablet under the tongue daily.  . DULoxetine (CYMBALTA) 60 MG capsule Take 1 capsule (60 mg total) by mouth daily.  Marland Kitchen EPINEPHrine (EPIPEN 2-PAK) 0.3 mg/0.3 mL IJ SOAJ injection Inject 0.3 mg into the muscle once.  . famotidine (PEPCID) 40 MG tablet TAKE 1 TABLET BY MOUTH TWICE A DAY. ( REPLACING ZANTAC/RANITIDINE)  . glucose blood (ONE TOUCH ULTRA TEST) test strip Check Blood sugar Daily for diabetes  . Lancets (ONETOUCH ULTRASOFT) lancets Check Blood sugar  daily  . losartan (COZAAR) 100 MG tablet TAKE 1 TABLET DAILY FOR BP AND KIDNEY PROTECTION  . Magnesium 250 MG TABS Take 250 mg by mouth 2 (two) times daily.  . metFORMIN (GLUCOPHAGE-XR) 500 MG 24 hr tablet Take 2 tablets 2 x /day with a meal for Diabetes    . metoprolol succinate (TOPROL-XL) 50 MG 24 hr tablet Take 1 tablet (50 mg total) by mouth daily. Take with or immediately following a meal.  . Multiple Vitamins-Minerals (MULTIVITAMIN PO) Take 1 tablet by mouth daily.   . Omega-3 Fatty Acids (FISH OIL PO) Take by mouth daily.  Marland Kitchen PRESCRIPTION MEDICATION Inject as directed every 30 (thirty) days. Allergy Shot  . rosuvastatin (CRESTOR) 20 MG tablet TAKE 1 TABLET BY MOUTH EVERY DAY FOR CHOLESTEROL  . Semaglutide, 1 MG/DOSE, (OZEMPIC, 1 MG/DOSE,) 2 MG/1.5ML SOPN Inject 1 mg into the skin once a week.  Marland Kitchen SYNTHROID 200 MCG tablet TAKE 1 TABLET DAILY ON AN EMPTY STOMACH WITH ONLY WATER FOR 30 MINUTES & NO ANTACID MEDS, CALCIUM OR MAGNESIUM FOR 4 HOURS & AVOID BIOTIN  . Turmeric (QC TUMERIC COMPLEX PO) Take by mouth.  . Vitamin D, Ergocalciferol, (DRISDOL) 1.25 MG (50000 UNIT) CAPS capsule Take 1 capsule 2 x /week for Vit D Deficiency     Allergies:   Floxin [ofloxacin], Invokana [canagliflozin], Viberzi [eluxadoline], Doxycycline, and Penicillins   Social History   Socioeconomic History  . Marital status: Married    Spouse name: Francee Piccolo  . Number of children: 2  . Years of education: masters  . Highest education level: Not on file  Occupational History  . Occupation: Retired Pharmacist, hospital  Tobacco Use  . Smoking status: Never Smoker  . Smokeless tobacco: Never Used  Vaping Use  . Vaping Use: Never used  Substance and Sexual Activity  . Alcohol use: Yes    Alcohol/week: 0.0 standard drinks    Comment: occasional  . Drug use: No  . Sexual activity: Yes  Other Topics Concern  . Not on file  Social History Narrative   Lives with husband, Francee Piccolo   Patient is right handed.   Patient drinks 64 oz of diet soda daily.   Social Determinants of Health   Financial Resource Strain:   . Difficulty of Paying Living Expenses: Not on file  Food Insecurity:   . Worried About Charity fundraiser in the Last Year: Not on file  . Ran Out of Food in the  Last Year: Not on file  Transportation Needs:   . Lack of Transportation (Medical): Not on file  . Lack of Transportation (Non-Medical): Not on file  Physical Activity:   . Days of Exercise per Week: Not on file  . Minutes of Exercise per Session: Not on file  Stress:   . Feeling of Stress : Not on file  Social Connections:   .  Frequency of Communication with Friends and Family: Not on file  . Frequency of Social Gatherings with Friends and Family: Not on file  . Attends Religious Services: Not on file  . Active Member of Clubs or Organizations: Not on file  . Attends Archivist Meetings: Not on file  . Marital Status: Not on file     Family History: The patient'sfamily history includes Breast cancer in her mother; Colon cancer in her son; Esophageal cancer (age of onset: 48) in her son; Hyperlipidemia in her father and sister; Hypertension in her father; Prostate cancer in her father; Stomach cancer in her son. There is no history of Colon polyps, Diabetes, Kidney disease, Migraines, or Rectal cancer.  ROS:   Please see the history of present illness.    All other systems reviewed and are negative.  EKGs/Labs/Other Studies Reviewed:    The following studies were reviewed today:  SPECT 07/29/19:  The left ventricular ejection fraction is hyperdynamic (>65%).  Nuclear stress EF: 70%.  There was no ST segment deviation noted during stress.  No T wave inversion was noted during stress.  The study is normal.  This is a low risk study.   Coronary CTA 07/04/19: 1. Coronary calcium score of 1. This was 53 percentile for age and sex matched control.  2. Normal coronary origin with right dominance.  3. There appears to be a coronary fistula from the LAD to the main pulmonary artery  4. Nonobstructive CAD, with calcified and noncalcified plaques causing minimal (0-24%) stenosis  CAD-RADS 1. Minimal non-obstructive CAD (0-24%). Consider non-atherosclerotic  causes of chest pain. Consider preventive therapy and risk factor modification.   TTE 07/01/19:  1. The left ventricle has normal systolic function with an ejection fraction of 60-65%. The cavity size was normal. Left ventricular diastolic parameters were normal.  2. The right ventricle has normal systolic function. The cavity was normal. There is no increase in right ventricular wall thickness.  3. No evidence of mitral valve stenosis.  4. No stenosis of the aortic valve.  5. The aorta is normal unless otherwise noted.  6. The aortic root and ascending aorta are normal in size and structure.  7. The atrial septum is grossly normal.  EKG:  EKG is ordered today.  The ekg ordered today demonstrates normal sinus rhythm, <1 mm ST depressions/TWI in V3-6, I,II  Recent Labs: 06/14/2020: ALT 24; BUN 12; Creat 0.90; Hemoglobin 14.4; Magnesium 1.9; Platelets 297; Potassium 4.2; Sodium 136; TSH 0.77  Recent Lipid Panel    Component Value Date/Time   CHOL 100 06/14/2020 1528   TRIG 155 (H) 06/14/2020 1528   HDL 43 (L) 06/14/2020 1528   CHOLHDL 2.3 06/14/2020 1528   VLDL 35 (H) 03/28/2017 1236   LDLCALC 34 06/14/2020 1528    Physical Exam:    VS:  BP 126/83   Pulse 98   Ht 5\' 3"  (1.6 m)   Wt 200 lb 12.8 oz (91.1 kg)   SpO2 97%   BMI 35.57 kg/m     Wt Readings from Last 3 Encounters:  09/20/20 200 lb 12.8 oz (91.1 kg)  07/28/20 206 lb 6.4 oz (93.6 kg)  07/20/20 208 lb (94.3 kg)     GEN: Well nourished, well developed in no acute distress HEENT: Normal NECK: No JVD LYMPHATICS: No lymphadenopathy CARDIAC: RRR, no murmurs, rubs, gallops RESPIRATORY:  Clear to auscultation without rales, wheezing or rhonchi  ABDOMEN: Soft, non-tender, non-distended MUSCULOSKELETAL:  No edema; No deformity  SKIN:  Warm and dry NEUROLOGIC:  Alert and oriented x 3 PSYCHIATRIC:  Normal affect   ASSESSMENT:    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 Chest pain, unspecified type    3. DOE (dyspnea on exertion)   4. Essential hypertension   5. Hyperlipidemia, unspecified hyperlipidemia type    PLAN:    In order of problems listed above:  Coronary fistula: From LAD to main pulmonary artery, seen on coronary CT on 07/04/2019.  Likely an incidental finding and not causing any pathology.  No evidence of ischemia on SPECT.  Normal chamber sizes on TTE.  Chest pain: Coronary CTA showed nonobstructive CAD.  Suspect secondary to GERD, improved with famotidine  DOE: reports dyspnea with minimal exertion.  TTE shows normal systolic function, normal diastolic function, no significant valvular abnormalities.  Suspect related to deconditioning  Hypertension: On losartan 100 mg daily, Toprol-XL 50 mg daily. Appears well-controlled, continue current meds.  Hyperlipidemia: On rosuvastatin 20 mg daily.  Last LDL 34 on 06/14/2020.  Well-controlled, continue rosuvastatin.  Type 2 diabetes: On Invokana and metformin.  Last A1c 6.5 on 06/10/2019.  Well-controlled  RTC in 1 year  Medication Adjustments/Labs and Tests Ordered: Current medicines are reviewed at length with the patient today.  Concerns regarding medicines are outlined above.  No orders of the defined types were placed in this encounter.  No orders of the defined types were placed in this encounter.   Patient Instructions  Medication Instructions:  Your physician recommends that you continue on your current medications as directed. Please refer to the Current Medication list given to you today.  Follow-Up: At [facility] you and your health needs are our priority.  As part of our continuing mission to provide you with exceptional heart care, we have created designated Provider Care Teams.  These Care Teams include your primary Cardiologist (physician) and Advanced Practice Providers (APPs -  Physician Assistants and Nurse Practitioners) who all work together to provide you with the care you need, when you need  it.  We recommend signing up for the patient portal called "MyChart".  Sign up information is provided on this After Visit Summary.  MyChart is used to connect with patients for Virtual Visits (Telemedicine).  Patients are able to view lab/test results, encounter notes, upcoming appointments, etc.  Non-urgent messages can be sent to your provider as well.   To learn more about what you can do with MyChart, go to [email or web address].    Your next appointment:   12 month(s)  The format for your next appointment:   In Person  Provider:   Oswaldo Milian, MD        Signed, Donato Heinz, MD  09/20/2020 11:57 PM    [place]

## 2020-09-20 ENCOUNTER — Other Ambulatory Visit: Payer: Self-pay

## 2020-09-20 ENCOUNTER — Ambulatory Visit (INDEPENDENT_AMBULATORY_CARE_PROVIDER_SITE_OTHER): Payer: Medicare HMO | Admitting: Cardiology

## 2020-09-20 ENCOUNTER — Encounter: Payer: Self-pay | Admitting: Cardiology

## 2020-09-20 VITALS — BP 126/83 | HR 98 | Ht 63.0 in | Wt 200.8 lb

## 2020-09-20 DIAGNOSIS — R079 Chest pain, unspecified: Secondary | ICD-10-CM | POA: Diagnosis not present

## 2020-09-20 DIAGNOSIS — R0600 Dyspnea, unspecified: Secondary | ICD-10-CM | POA: Diagnosis not present

## 2020-09-20 DIAGNOSIS — Q245 Malformation of coronary vessels: Secondary | ICD-10-CM

## 2020-09-20 DIAGNOSIS — I10 Essential (primary) hypertension: Secondary | ICD-10-CM

## 2020-09-20 DIAGNOSIS — E785 Hyperlipidemia, unspecified: Secondary | ICD-10-CM | POA: Diagnosis not present

## 2020-09-20 DIAGNOSIS — R0609 Other forms of dyspnea: Secondary | ICD-10-CM

## 2020-09-20 NOTE — Patient Instructions (Signed)
Medication Instructions:  Your physician recommends that you continue on your current medications as directed. Please refer to the Current Medication list given to you today.  Follow-Up: At [facility], you and your health needs are our priority.  As part of our continuing mission to provide you with exceptional heart care, we have created designated Provider Care Teams.  These Care Teams include your primary Cardiologist (physician) and Advanced Practice Providers (APPs -  Physician Assistants and Nurse Practitioners) who all work together to provide you with the care you need, when you need it.  We recommend signing up for the patient portal called "MyChart".  Sign up information is provided on this After Visit Summary.  MyChart is used to connect with patients for Virtual Visits (Telemedicine).  Patients are able to view lab/test results, encounter notes, upcoming appointments, etc.  Non-urgent messages can be sent to your provider as well.   To learn more about what you can do with MyChart, go to [email or web address].    Your next appointment:   12 month(s)  The format for your next appointment:   In Person  Provider:   Christopher Schumann, MD    

## 2020-09-20 NOTE — Progress Notes (Signed)
FOLLOW UP  Assessment:   Atherosclerosis of aorta Per CT 07/2019 Control blood pressure, cholesterol, glucose, increase exercise.    Essential hypertension - continue medications, DASH diet, exercise and monitor at home. Call if greater than 130/80.  - CBC with Differential/Platelet - CMP/GFR - TSH  Diabetes mellitus due to underlying condition with diabetic chronic kidney disease Doing well with ozempic May be able to cut back on metformin as well pending A1C - Hemoglobin A1c  CKD stage 2 associated with T2DM (HCC) Increase fluids, avoid NSAIDS, monitor sugars, will monitor - CMP WITH GFR  Hyperlipidemia associated with T2DM (HCC)  LDL goal <70, can reduce statin if remains aggressively controlled check lipids, decrease fatty foods, increase activity.  - Lipid panel  Medication management - Magnesium  Depression, major, recurrent, in remission ([place]) Doing well with cymbalta Lifestyle discussed: diet/exerise, sleep hygiene, stress management, hydration  Morbid obesity ([place]) - increase veggies, decrease carbs - long discussion about weight loss, diet, and exercise Continue ozempic - excellent progress with this   Hypothyroidism continue medications the same pending lab results reminded to take on an empty stomach 30-48mins before food.  check TSH level  GERD Initiated medication for new reflux related symptoms: omeprazole 20 mg daily - take 1 hour prior to bedtime, then reduce famotidine to AM PRN Discussed diet, avoiding triggers and other lifestyle changes  Need for influenza vaccine High dose quadrivalent administered without complication today    Over 30 minutes of exam, counseling, chart review, and critical decision making was performed  Future Appointments  Date Time Provider [place]  09/27/2020  9:00 PM [facility] None  11/23/2020  1:30 PM Valentina Shaggy, MD [facility] None  12/22/2020  3:30 PM Liane Comber, NP  [facility] None  06/22/2021  2:00 PM Liane Comber, NP [facility] None  07/28/2021 11:15 AM Suzzanne Cloud, NP [facility] None     Subjective:   Grace Barry is a 69 y.o. female who presents for  3 month follow up on hypertension, T2 diabetes with CKD, hyperlipidemia, vitamin D def.   She has sleep apnea, also severe dreams and concern for REM disorder per Dr. Brett Fairy, currently on oral device due to frequent travel, home study was inconclusive, has follow sleep study planned next week.  She had decreased L sided hearing following viral illness, has slowly recovered following steroid injection by ENT in ? 2016. No longer wearing hearing aid, feels ha simproved nearly to baseline.   She has hx of major depression, chronic widespread myalgias suspected for fibromyalgia, recently switched from paxil to cymbalta and has noted less fatigue and pain.   She has GERD, taking famotidine 40 mg BID, reports has breakthrough at night ~ 2 nights a week, does sleep with HOB elevated, dinner is 4+ hours prior to lying down, cannot correlate with size of meal or trigger food.   BMI is Body mass index is 35.61 kg/m., she has not been working on diet and exercise. She was started on ozempic 1 mg weekly, eating smaller portions, admits needs to exercise more.  Wt Readings from Last 3 Encounters:  09/21/20 201 lb (91.2 kg)  09/20/20 200 lb 12.8 oz (91.1 kg)  07/28/20 206 lb 6.4 oz (93.6 kg)   She had CT coronary 07/2019 -CAD-RADS 1. Minimal non-obstructive CAD (0-24%) but did show aortic atherosclerosis. Follows with Dr. Gardiner Rhyme annually.  Her blood pressure has been controlled at home,  Metoprolol 50mg  XL for palpitations, losartan  100 mg,BP:  122/74  She denies chest pain, shortness of breath, dizziness.   She has been working on diet and exercise for Diabetes With CKD on ARB With hyperlipidemia on statin, crestor 20mg , at goal less than 70 She is on ozempic 1 mg weekly Hasn't checked recent bu tlast 1  month ago was 105 She was on invokana but had hematuria so was taken off, has had benign exam with Dr. Junious Silk   and denies paresthesia of the feet, polydipsia, polyuria and visual disturbances.  Last A1C was:  Lab Results  Component Value Date   HGBA1C 6.5 (H) 06/14/2020    She has CKD IIIa associated with T2DM monitored at this office:  Lab Results  Component Value Date   GFRNONAA 65 06/14/2020   GFRNONAA 50 (L) 03/24/2020   GFRNONAA 58 (L) 01/12/2020   Last lipids:  Lab Results  Component Value Date   CHOL 100 06/14/2020   HDL 43 (L) 06/14/2020   LDLCALC 34 06/14/2020   TRIG 155 (H) 06/14/2020   CHOLHDL 2.3 06/14/2020   Patient is on Vitamin D supplement and above goal at recent check, has reduced 50000 IU 4 days a week (for many years), has reduced to twice a week  Lab Results  Component Value Date   VD25OH 142 (H) 06/14/2020   She is on thyroid medication. Her medication was not changed last visit, one pill daily, not on a biotin.  Lab Results  Component Value Date   TSH 0.77 06/14/2020      Medication Review  Current Outpatient Medications (Endocrine & Metabolic):  .  metFORMIN (GLUCOPHAGE-XR) 500 MG 24 hr tablet, Take 2 tablets 2 x /day with a meal for Diabetes .  Semaglutide, 1 MG/DOSE, (OZEMPIC, 1 MG/DOSE,) 2 MG/1.5ML SOPN, Inject 1 mg into the skin once a week. Marland Kitchen  SYNTHROID 200 MCG tablet, TAKE 1 TABLET DAILY ON AN EMPTY STOMACH WITH ONLY WATER FOR 30 MINUTES & NO ANTACID MEDS, CALCIUM OR MAGNESIUM FOR 4 HOURS & AVOID BIOTIN  Current Outpatient Medications (Cardiovascular):  Marland Kitchen  EPINEPHrine (EPIPEN 2-PAK) 0.3 mg/0.3 mL IJ SOAJ injection, Inject 0.3 mg into the muscle once. Marland Kitchen  losartan (COZAAR) 100 MG tablet, TAKE 1 TABLET DAILY FOR BP AND KIDNEY PROTECTION .  metoprolol succinate (TOPROL-XL) 50 MG 24 hr tablet, Take 1 tablet (50 mg total) by mouth daily. Take with or immediately following a meal. .  rosuvastatin (CRESTOR) 20 MG tablet, TAKE 1 TABLET BY MOUTH  EVERY DAY FOR CHOLESTEROL   Current Outpatient Medications (Analgesics):  .  aspirin 81 MG tablet, Take 81 mg by mouth daily.  Current Outpatient Medications (Hematological):  Marland Kitchen  Cyanocobalamin (VITAMIN B12 SL), Place 1 tablet under the tongue daily.  Current Outpatient Medications (Other):  .  baclofen (LIORESAL) 10 MG tablet, Take 1 tablet (10 mg total) by mouth 2 (two) times daily as needed for muscle spasms. Marland Kitchen  CALCIUM PO, Take 1,200 mg by mouth daily. .  DULoxetine (CYMBALTA) 60 MG capsule, Take 1 capsule (60 mg total) by mouth daily. .  famotidine (PEPCID) 40 MG tablet, TAKE 1 TABLET BY MOUTH TWICE A DAY. ( REPLACING ZANTAC/RANITIDINE) .  glucose blood (ONE TOUCH ULTRA TEST) test strip, Check Blood sugar Daily for diabetes .  Lancets (ONETOUCH ULTRASOFT) lancets, Check Blood sugar  daily .  Magnesium 250 MG TABS, Take 250 mg by mouth 2 (two) times daily. .  Multiple Vitamins-Minerals (MULTIVITAMIN PO), Take 1 tablet by mouth daily.  .  Omega-3 Fatty Acids (FISH  OIL PO), Take by mouth daily. Marland Kitchen  PRESCRIPTION MEDICATION, Inject as directed every 30 (thirty) days. Allergy Shot .  Turmeric (QC TUMERIC COMPLEX PO), Take by mouth. .  Vitamin D, Ergocalciferol, (DRISDOL) 1.25 MG (50000 UNIT) CAPS capsule, Take 1 capsule 2 x /week for Vit D Deficiency  Current Problems (verified) Patient Active Problem List   Diagnosis Date Noted  . Tremor 07/28/2020  . Uncontrolled REM sleep behavior disorder 07/20/2020  . Sleep paralysis, recurrent isolated 07/20/2020  . Bad dreams 07/20/2020  . Sleep related headaches 07/20/2020  . Fatigue due to depression 07/20/2020  . Psychophysiological insomnia 07/20/2020  . Aortic atherosclerosis ([place]) 12/12/2019  . Pain 01/17/2019  . Hyperlipidemia associated with type 2 diabetes mellitus ([place]) 11/07/2018  . Osteopenia 11/07/2018  . Seasonal and perennial allergic rhinitis 07/17/2018  . Vitamin D deficiency disease 05/19/2015  . Medication management  05/19/2015  . Migraine 02/19/2015  . Vertigo 02/19/2015  . History of colonic polyps 01/15/2015  . IBS (irritable bowel syndrome) 09/01/2014  . Morbid obesity ([place]) 07/01/2014  . CKD stage 2 due to type 2 diabetes mellitus ([place]) 07/01/2014  . Fatty liver   . Arthritis   . Hypothyroidism 12/15/2010  . T2_NIDDM w/CKD (GFR 56 ml/min) 12/15/2010  . Depression, major, recurrent, in remission ([place]) 12/15/2010  . Obstructive sleep apnea 12/15/2010  . Essential hypertension 12/15/2010  . GERD 12/15/2010    Allergies Allergies  Allergen Reactions  . Floxin [Ofloxacin] Hives  . Invokana [Canagliflozin]     Hematuria- avoid class  . Viberzi [Eluxadoline] Hives  . Doxycycline Rash  . Penicillins Rash    Surgical History: reviewed and unchanged Family History: reviewed and unchanged Social History: reviewed and unchanged  Review of Systems:  Review of Systems  Constitutional: Negative.   HENT: Negative for congestion, ear discharge, ear pain, hearing loss, nosebleeds, sore throat and tinnitus.   Eyes: Negative.   Respiratory: Negative.  Negative for stridor.   Cardiovascular: Negative.   Gastrointestinal: Positive for heartburn. Negative for abdominal pain, blood in stool, constipation, diarrhea, melena, nausea and vomiting.  Genitourinary: Negative.   Musculoskeletal: Positive for myalgias. Negative for back pain, falls, joint pain and neck pain.  Skin: Negative.   Neurological: Negative for dizziness, tingling, tremors, sensory change, speech change, focal weakness, seizures, loss of consciousness and headaches.  Endo/Heme/Allergies: Negative.   Psychiatric/Behavioral: Negative for depression, substance abuse and suicidal ideas. The patient has insomnia. The patient is not nervous/anxious.      Objective:   Today's Vitals   09/21/20 1436  BP: 122/74  Pulse: (!) 102  Temp: (!) 96.3 F (35.7 C)  SpO2: 96%  Weight: 201 lb (91.2 kg)   General appearance: alert, no  distress, WD/WN,  female HEENT: normocephalic, sclerae anicteric, bil soft wax obstructing ext canals, TMs not visualized, nares patent, no discharge or erythema, pharynx normal Oral cavity: MMM, no lesions Neck: supple, no lymphadenopathy, no thyromegaly, no masses Heart: RRR, normal S1, S2, no murmurs Lungs: CTA bilaterally, no wheezes, rhonchi, or rales Abdomen: +bs, soft, non tender, non distended, no masses, no hepatomegaly, no splenomegaly Musculoskeletal: l nontender, no swelling, no obvious deformity Extremities: no edema, no cyanosis, no clubbing Pulses: 2+ symmetric, upper and lower extremities, normal cap refill Neurological: alert, oriented x 3, CN2-12 intact, strength normal upper extremities and lower extremities, sensation normal throughout, DTRs 2+ throughout, no cerebellar signs, gait normal Psychiatric: normal affect, behavior normal, pleasant    Izora Ribas, NP   09/21/2020

## 2020-09-21 ENCOUNTER — Encounter: Payer: Self-pay | Admitting: Adult Health

## 2020-09-21 ENCOUNTER — Ambulatory Visit (INDEPENDENT_AMBULATORY_CARE_PROVIDER_SITE_OTHER): Payer: Medicare HMO | Admitting: Adult Health

## 2020-09-21 VITALS — BP 122/74 | HR 102 | Temp 96.3°F | Wt 201.0 lb

## 2020-09-21 DIAGNOSIS — I10 Essential (primary) hypertension: Secondary | ICD-10-CM

## 2020-09-21 DIAGNOSIS — D72829 Elevated white blood cell count, unspecified: Secondary | ICD-10-CM

## 2020-09-21 DIAGNOSIS — E1169 Type 2 diabetes mellitus with other specified complication: Secondary | ICD-10-CM | POA: Diagnosis not present

## 2020-09-21 DIAGNOSIS — Z79899 Other long term (current) drug therapy: Secondary | ICD-10-CM | POA: Diagnosis not present

## 2020-09-21 DIAGNOSIS — Z23 Encounter for immunization: Secondary | ICD-10-CM

## 2020-09-21 DIAGNOSIS — N182 Chronic kidney disease, stage 2 (mild): Secondary | ICD-10-CM

## 2020-09-21 DIAGNOSIS — E039 Hypothyroidism, unspecified: Secondary | ICD-10-CM

## 2020-09-21 DIAGNOSIS — I700 Atherosclerosis of aorta: Secondary | ICD-10-CM

## 2020-09-21 DIAGNOSIS — E559 Vitamin D deficiency, unspecified: Secondary | ICD-10-CM

## 2020-09-21 DIAGNOSIS — E1122 Type 2 diabetes mellitus with diabetic chronic kidney disease: Secondary | ICD-10-CM

## 2020-09-21 DIAGNOSIS — R69 Illness, unspecified: Secondary | ICD-10-CM | POA: Diagnosis not present

## 2020-09-21 DIAGNOSIS — G4733 Obstructive sleep apnea (adult) (pediatric): Secondary | ICD-10-CM

## 2020-09-21 DIAGNOSIS — E1129 Type 2 diabetes mellitus with other diabetic kidney complication: Secondary | ICD-10-CM

## 2020-09-21 DIAGNOSIS — F3340 Major depressive disorder, recurrent, in remission, unspecified: Secondary | ICD-10-CM

## 2020-09-21 DIAGNOSIS — E785 Hyperlipidemia, unspecified: Secondary | ICD-10-CM | POA: Diagnosis not present

## 2020-09-21 MED ORDER — FAMOTIDINE 40 MG PO TABS: ORAL_TABLET | ORAL | 1 refills | Status: DC

## 2020-09-21 MED ORDER — OMEPRAZOLE 20 MG PO CPDR: 20.0000 mg | DELAYED_RELEASE_CAPSULE | Freq: Every day | ORAL | 1 refills | Status: DC

## 2020-09-21 NOTE — Addendum Note (Signed)
Addended by: Chancy Hurter on: 09/21/2020 03:18 PM   Modules accepted: Orders

## 2020-09-21 NOTE — Patient Instructions (Addendum)
Goals    . Weight (lb) < 190 lb (86.2 kg)     Try to cut back on chocolate, avoid emotional/habitual eating Check weight once a week         Fiber Chart  You should be consuming 25-30g of fiber per day.  In the chart below you can look up how much fiber you are getting in an average day.  If you are not getting enough fiber, you should add a fiber supplement to your diet.  Examples of this include Metamucil, FiberCon and Citrucel.  These can be purchased at your local grocery store or pharmacy.      [email or web address]

## 2020-09-22 MED ORDER — VITAMIN D (ERGOCALCIFEROL) 1.25 MG (50000 UNIT) PO CAPS: ORAL_CAPSULE | ORAL | 1 refills | Status: DC

## 2020-09-22 MED ORDER — ROSUVASTATIN CALCIUM 10 MG PO TABS: ORAL_TABLET | ORAL | 1 refills | Status: DC

## 2020-09-22 NOTE — Addendum Note (Signed)
Addended by: Izora Ribas on: 09/22/2020 08:07 AM   Modules accepted: Orders

## 2020-09-24 LAB — COMPLETE METABOLIC PANEL WITH GFR
AG Ratio: 1.8 (calc) (ref 1.0–2.5)
ALT: 28 U/L (ref 6–29)
AST: 24 U/L (ref 10–35)
Albumin: 4.6 g/dL (ref 3.6–5.1)
Alkaline phosphatase (APISO): 99 U/L (ref 37–153)
BUN: 11 mg/dL (ref 7–25)
CO2: 27 mmol/L (ref 20–32)
Calcium: 9.8 mg/dL (ref 8.6–10.4)
Chloride: 98 mmol/L (ref 98–110)
Creat: 0.86 mg/dL (ref 0.60–0.93)
GFR, Est African American: 79 mL/min/{1.73_m2} (ref >=60)
GFR, Est Non African American: 68 mL/min/{1.73_m2} (ref >=60)
Globulin: 2.6 g/dL (calc) (ref 1.9–3.7)
Glucose, Bld: 114 mg/dL — ABNORMAL HIGH (ref 65–99)
Potassium: 4.3 mmol/L (ref 3.5–5.3)
Sodium: 137 mmol/L (ref 135–146)
Total Bilirubin: 0.8 mg/dL (ref 0.2–1.2)
Total Protein: 7.2 g/dL (ref 6.1–8.1)

## 2020-09-24 LAB — CBC WITH DIFFERENTIAL/PLATELET
Absolute Monocytes: 719 cells/uL (ref 200–950)
Basophils Absolute: 46 cells/uL (ref 0–200)
Basophils Relative: 0.4 %
Eosinophils Absolute: 209 cells/uL (ref 15–500)
Eosinophils Relative: 1.8 %
HCT: 47.6 % — ABNORMAL HIGH (ref 35.0–45.0)
Hemoglobin: 15.9 g/dL — ABNORMAL HIGH (ref 11.7–15.5)
Lymphs Abs: 4048 cells/uL — ABNORMAL HIGH (ref 850–3900)
MCH: 29.1 pg (ref 27.0–33.0)
MCHC: 33.4 g/dL (ref 32.0–36.0)
MCV: 87 fL (ref 80.0–100.0)
MPV: 10.7 fL (ref 7.5–12.5)
Monocytes Relative: 6.2 %
Neutro Abs: 6577 cells/uL (ref 1500–7800)
Neutrophils Relative %: 56.7 %
Platelets: 348 10*3/uL (ref 140–400)
RBC: 5.47 10*6/uL — ABNORMAL HIGH (ref 3.80–5.10)
RDW: 12.7 % (ref 11.0–15.0)
Total Lymphocyte: 34.9 %
WBC: 11.6 10*3/uL — ABNORMAL HIGH (ref 3.8–10.8)

## 2020-09-24 LAB — LIPID PANEL
Cholesterol: 93 mg/dL (ref <200)
HDL: 35 mg/dL — ABNORMAL LOW (ref >=50)
LDL Cholesterol (Calc): 32 mg/dL (calc)
Non-HDL Cholesterol (Calc): 58 mg/dL (calc) (ref <130)
Total CHOL/HDL Ratio: 2.7 (calc) (ref <5.0)
Triglycerides: 182 mg/dL — ABNORMAL HIGH (ref <150)

## 2020-09-24 LAB — MAGNESIUM: Magnesium: 1.8 mg/dL (ref 1.5–2.5)

## 2020-09-24 LAB — HEMOGLOBIN A1C
Hgb A1c MFr Bld: 6.2 % of total Hgb — ABNORMAL HIGH (ref <5.7)
Mean Plasma Glucose: 131 (calc)
eAG (mmol/L): 7.3 (calc)

## 2020-09-24 LAB — VITAMIN D 25 HYDROXY (VIT D DEFICIENCY, FRACTURES): Vit D, 25-Hydroxy: 142 ng/mL — ABNORMAL HIGH (ref 30–100)

## 2020-09-24 LAB — TSH: TSH: 0.36 mIU/L — ABNORMAL LOW (ref 0.40–4.50)

## 2020-09-24 LAB — TEST AUTHORIZATION

## 2020-09-24 LAB — PATHOLOGIST SMEAR REVIEW

## 2020-09-27 ENCOUNTER — Other Ambulatory Visit: Payer: Self-pay | Admitting: Adult Health

## 2020-09-27 ENCOUNTER — Encounter: Payer: Self-pay | Admitting: Adult Health

## 2020-09-27 ENCOUNTER — Ambulatory Visit (INDEPENDENT_AMBULATORY_CARE_PROVIDER_SITE_OTHER): Payer: Medicare HMO | Admitting: Neurology

## 2020-09-27 DIAGNOSIS — I700 Atherosclerosis of aorta: Secondary | ICD-10-CM

## 2020-09-27 DIAGNOSIS — F5104 Psychophysiologic insomnia: Secondary | ICD-10-CM

## 2020-09-27 DIAGNOSIS — D7289 Other specified disorders of white blood cells: Secondary | ICD-10-CM | POA: Insufficient documentation

## 2020-09-27 DIAGNOSIS — F515 Nightmare disorder: Secondary | ICD-10-CM

## 2020-09-27 DIAGNOSIS — R519 Headache, unspecified: Secondary | ICD-10-CM

## 2020-09-27 DIAGNOSIS — G4736 Sleep related hypoventilation in conditions classified elsewhere: Secondary | ICD-10-CM

## 2020-09-27 DIAGNOSIS — D72829 Elevated white blood cell count, unspecified: Secondary | ICD-10-CM | POA: Insufficient documentation

## 2020-09-27 DIAGNOSIS — R0902 Hypoxemia: Secondary | ICD-10-CM | POA: Diagnosis not present

## 2020-09-27 DIAGNOSIS — R5383 Other fatigue: Secondary | ICD-10-CM

## 2020-09-27 DIAGNOSIS — D72820 Lymphocytosis (symptomatic): Secondary | ICD-10-CM

## 2020-09-27 DIAGNOSIS — F32A Depression, unspecified: Secondary | ICD-10-CM

## 2020-09-29 ENCOUNTER — Ambulatory Visit (INDEPENDENT_AMBULATORY_CARE_PROVIDER_SITE_OTHER): Payer: Medicare HMO | Admitting: *Deleted

## 2020-09-29 DIAGNOSIS — J309 Allergic rhinitis, unspecified: Secondary | ICD-10-CM

## 2020-09-30 ENCOUNTER — Other Ambulatory Visit: Payer: Self-pay

## 2020-09-30 ENCOUNTER — Ambulatory Visit (INDEPENDENT_AMBULATORY_CARE_PROVIDER_SITE_OTHER): Payer: Medicare HMO

## 2020-09-30 VITALS — Temp 98.1°F | Wt 202.0 lb

## 2020-09-30 DIAGNOSIS — D72820 Lymphocytosis (symptomatic): Secondary | ICD-10-CM

## 2020-09-30 DIAGNOSIS — D7289 Other specified disorders of white blood cells: Secondary | ICD-10-CM

## 2020-09-30 NOTE — Progress Notes (Signed)
Pt reports for blood lab work that was entered into [employer] by ordering provider.

## 2020-10-02 DIAGNOSIS — G4736 Sleep related hypoventilation in conditions classified elsewhere: Secondary | ICD-10-CM | POA: Insufficient documentation

## 2020-10-02 NOTE — Procedures (Signed)
PATIENT'S NAME:  Grace Barry, Grace Barry DOB:      [birth year]      MR#:    [id number]     DATE OF RECORDING: 09/27/2020  [facility] REFERRING M.D.:  Unk Pinto, MD/  Cc: Oswaldo Milian, MD, Lenor Coffin, MD  Study Performed:   Baseline Polysomnogram on a previous CPAP user.   HISTORY:  this patient underwent a HST on 08-11-2020 which confusingly stated that her AHI was higher when using the dental device than in the half of the night she did not wear it. For this reason she returned today to look at the effect of her dental device in an attended sleep study.   Hx Grace Barry is a 69 year old Caucasian female patient seen here upon a referral for a sleep consultation on 07/20/2020, by Dr. Melford Aase, MD. The patient is a patient of Dr Floyde Parkins, MD. Chief concern: Mrs. Hoey describes having undergone a sleep study with Dr. Shelia Media at [facility] about 15 years ago, she was diagnosed with obstructive sleep apnea at the time and was issued a CPAP which she used until about 3 or 4 years ago.  She then was referred for an oral appliance to Dr. Augustina Mood, DMD, she also lost about 40 pounds and that is time interval, and she states that with the oral appliance the snoring was resolved, and her apnea as seen on home sleep test seem to have improved significantly.  This did however not affect the degree of daytime sleepiness or fatigue or her problems with insomnia.  She also experiences some symptoms that may relate to restless legs, Sleep paralysis, night terrors and nightmares.  Initiating sleep has been difficult- ever for the last 6 years after her son became ill, died of cancer. She now continues to sleep for intervals of 2-3 hours, wakes for bathroom breaks, the first time at 3-4 AM.  Dreams are reportedly frequent/vivid. 11 AM is the usual rise time. The patient wakes up spontaneously already at 8 AM but finds it hard to get up.  She reports not feeling refreshed or restored in AM,  with symptoms such as dry mouth, morning headaches, and residual fatigue. Naps are taken frequently, lasting from 1-2 hours and are not more or less refreshing than nocturnal sleep.  The patient endorsed the Epworth Sleepiness Scale at 14/24 points.   The patient's weight 207 pounds with a height of 63 (inches), resulting in a BMI of 36.7 kg/m2. The patient's neck circumference measured 16 inches.  CURRENT MEDICATIONS: Aspirin, Lioresal, Calcium po, Vitamin B12, Cymbalta, Pepcid, Cozaar, magnesium, Glucophage, Toprol XL, Multivitamin, Omega-3, Ozempic, Crestor, Synthroid, Turmeric, Drisdol.   PROCEDURE:  This is a multichannel digital polysomnogram utilizing the Somnostar 11.2 system.  Electrodes and sensors were applied and monitored per AASM Specifications.   EEG, EOG, Chin and Limb EMG, were sampled at 200 Hz.  ECG, Snore and Nasal Pressure, Thermal Airflow, Respiratory Effort, CPAP Flow and Pressure, Oximetry was sampled at 50 Hz. Digital video and audio were recorded.       BASELINE STUDY: Lights Out was at 22:15 and Lights On at 05:08.  Total recording time (TRT) was 413 minutes, with a total sleep time (TST) of 293.5 minutes.   The patient's sleep latency was 99 minutes.  REM latency was 0 minutes.  The sleep efficiency was 71.1 %.     SLEEP ARCHITECTURE: WASO (Wake after sleep onset) was 30.5 minutes.  There were 4 minutes in Stage N1,  266 minutes Stage N2, 23.5 minutes Stage N3 and 0 minutes in Stage REM.  The percentage of Stage N1 was 1.4%, Stage N2 was 90.6%, Stage N3 was 8.% and Stage R (REM sleep) was 0%.   RESPIRATORY ANALYSIS:  There were a total of 19 respiratory events:  1 obstructive apnea, and 18 hypopneas with a hypopnea index of 3.7 /hour. There were 6 RERAS. The total APNEA/HYPOPNEA INDEX (AHI) was 3.9/hour.  0 events occurred in REM sleep and 36 events in NREM, with a non-REM AHI of 3.9/h. The patient spent 81 minutes of total sleep time in the supine position and 213 minutes  in non-supine. The supine AHI was 3.0 versus a non-supine AHI of 4.3.  OXYGEN SATURATION & C02:  The Wake baseline 02 saturation was 90%, with the lowest being 84%. Time spent below 89% saturation equaled 146 minutes.  The arousals were noted as: 46 were spontaneous, 5 were associated with PLMs, and 4 were associated with respiratory events. The patient had a total of 24 Periodic Limb Movements.  The Periodic Limb Movement (PLM) Arousal index was 0.1/hour. Audio and video analysis did not show any abnormal or unusual movements, behaviors, phonations or vocalizations.  Soft Snoring was noted. EKG was in keeping with normal sinus rhythm (NSR).  IMPRESSION: No evidence of any clinically relevant degree of sleep apnea, PLM disorder, mild snoring only. EKG was normal. The only 'organic 'abnormality is hypoxemia. Since this is unrelated to Obstructive Sleep Apnea (OSA), I will not be able to treat with oxygen (AASM guidelines allow for a sleep lab to supplement oxygen only if apnea was present and treated) . 1.  Hypoxemia can explain some of the fatigue, and morning headaches. 2. Depression can play a significant role here, too. The patient has suffered from a prolonged/ delayed grief reaction.   RECOMMENDATIONS: I called to patient to confirm that she had not worn her dental device in the sleep lab and she had not. This means without any treatment at this time there is truly no apnea, only mild snoring and persistent hypoxemia.  The only abnormality is hypoxemia. Since this is unrelated to Obstructive Sleep Apnea (OSA), I will not be able to treat with oxygen. I would need to defer to primary care and ask Dr Jones Broom to arrange for a pulmonology or cardiology referral.   I certify that I have reviewed the entire raw data recording prior to the issuance of this report in accordance with the Standards of Accreditation of the American Academy of Sleep Medicine (AASM)   Larey Seat, MD Diplomat,  American Board of Psychiatry and Neurology  Diplomat, American Board of Sleep Medicine Medical Director, [facility]

## 2020-10-02 NOTE — Progress Notes (Signed)
I will ask Dr. Melford Aase to refer to pulmonology/ cardiology for hypoxemia -  See explanation here :No evidence of any clinically relevant degree of sleep apnea, PLM disorder, mild snoring only. EKG was normal. The only 'organic 'abnormality is hypoxemia. Since this is unrelated to Obstructive Sleep Apnea (OSA), I will not be able to treat with oxygen (AASM guidelines allow for a sleep lab to supplement oxygen only if apnea was present and treated) . 1.  Hypoxemia can explain some of the fatigue, and morning headaches. 2. Depression can play a significant role here, too. The patient has suffered from a prolonged/ delayed grief reaction.   RECOMMENDATIONS: I called to patient to confirm that she had not worn her dental device in the sleep lab and she had not. This means without any treatment at this time there is truly no apnea, only mild snoring and persistent hypoxemia.  The only abnormality is hypoxemia. Since this is unrelated to Obstructive Sleep Apnea (OSA), I will not be able to treat with oxygen. I would need to defer to primary care and ask Dr Jones Broom to arrange for a pulmonology or cardiology referral.

## 2020-10-03 LAB — LEUKEMIA/LYMPHOMA EVALUATION PANEL
NUMBER OF MARKERS:: 22
VIABILITY:: 66 %

## 2020-10-04 ENCOUNTER — Other Ambulatory Visit: Payer: Self-pay | Admitting: Internal Medicine

## 2020-10-04 DIAGNOSIS — I10 Essential (primary) hypertension: Secondary | ICD-10-CM

## 2020-10-04 DIAGNOSIS — G4734 Idiopathic sleep related nonobstructive alveolar hypoventilation: Secondary | ICD-10-CM

## 2020-10-04 NOTE — Progress Notes (Signed)
Pulmonary Referral requested per Dr Edwena Felty Recommendations

## 2020-10-04 NOTE — Progress Notes (Signed)
============================================================  -   WBC flow Cytometry test is OK - No suggestion of early leukemia

## 2020-10-06 ENCOUNTER — Ambulatory Visit (INDEPENDENT_AMBULATORY_CARE_PROVIDER_SITE_OTHER): Payer: Medicare HMO | Admitting: *Deleted

## 2020-10-06 DIAGNOSIS — J309 Allergic rhinitis, unspecified: Secondary | ICD-10-CM

## 2020-10-07 ENCOUNTER — Ambulatory Visit (INDEPENDENT_AMBULATORY_CARE_PROVIDER_SITE_OTHER): Payer: Medicare HMO | Admitting: Pulmonary Disease

## 2020-10-07 ENCOUNTER — Encounter: Payer: Self-pay | Admitting: Pulmonary Disease

## 2020-10-07 ENCOUNTER — Other Ambulatory Visit: Payer: Self-pay

## 2020-10-07 VITALS — BP 118/70 | HR 100 | Temp 97.3°F | Ht 63.0 in | Wt 203.0 lb

## 2020-10-07 DIAGNOSIS — R0600 Dyspnea, unspecified: Secondary | ICD-10-CM | POA: Diagnosis not present

## 2020-10-07 DIAGNOSIS — G4734 Idiopathic sleep related nonobstructive alveolar hypoventilation: Secondary | ICD-10-CM

## 2020-10-07 DIAGNOSIS — R0609 Other forms of dyspnea: Secondary | ICD-10-CM

## 2020-10-07 NOTE — Progress Notes (Signed)
Grace Barry    [id number]    07/08/1951  Primary Care Physician:McKeown, Gwyndolyn Saxon, MD  Referring Physician: Unk Barry, [address] [place],  [place] [address]  Chief complaint:   Patient being seen for nocturnal desaturations  HPI:  Patient being seen for nocturnal desaturations Past history of obstructive sleep apnea for which she was using an oral device  Evaluation started with persistent headaches  Had a home sleep study with oral device in place, led to questionable results but with hypoxemia  In lab study was performed showing significant hypoxemia without significant obstructive sleep apnea  Patient has a history of asthma, has not been on any inhalers, does have allergies for which she gets allergy shots and has not been having any asthma symptoms  She is limited with pain and discomfort, has had both knees replaced  She is a Pharmacist, hospital  Never smoker, no significant exposure to secondhand smoke growing up on in work environment  She has lost over 40 pounds in 4 to 5 years  No family history of lung disease known to patient  Outpatient Encounter Medications as of 10/07/2020  Medication Sig  . aspirin 81 MG tablet Take 81 mg by mouth daily.  . baclofen (LIORESAL) 10 MG tablet Take 1 tablet (10 mg total) by mouth 2 (two) times daily as needed for muscle spasms.  Marland Kitchen CALCIUM PO Take 1,200 mg by mouth daily.  . Cyanocobalamin (VITAMIN B12 SL) Place 1 tablet under the tongue daily.  . DULoxetine (CYMBALTA) 60 MG capsule Take 1 capsule (60 mg total) by mouth daily.  Marland Kitchen EPINEPHrine (EPIPEN 2-PAK) 0.3 mg/0.3 mL IJ SOAJ injection Inject 0.3 mg into the muscle once.  . famotidine (PEPCID) 40 MG tablet Take 1 tab daily as needed for refllux  . glucose blood (ONE TOUCH ULTRA TEST) test strip Check Blood sugar Daily for diabetes  . Lancets (ONETOUCH ULTRASOFT) lancets Check Blood sugar  daily  . losartan (COZAAR) 100 MG tablet Take      1  tablet       Daily         for BP and Kidney protection  . Magnesium 250 MG TABS Take 250 mg by mouth 2 (two) times daily.  . metFORMIN (GLUCOPHAGE-XR) 500 MG 24 hr tablet Take 2 tablets 2 x /day with a meal for Diabetes  . metoprolol succinate (TOPROL-XL) 50 MG 24 hr tablet Take 1 tablet (50 mg total) by mouth daily. Take with or immediately following a meal.  . Multiple Vitamins-Minerals (MULTIVITAMIN PO) Take 1 tablet by mouth daily.   . Omega-3 Fatty Acids (FISH OIL PO) Take by mouth daily.  Marland Kitchen omeprazole (PRILOSEC) 20 MG capsule Take 1 capsule (20 mg total) by mouth daily. 1 hour prior to bedtime.  Marland Kitchen PRESCRIPTION MEDICATION Inject as directed every 30 (thirty) days. Allergy Shot  . rosuvastatin (CRESTOR) 10 MG tablet TAKE 1 TABLET BY MOUTH EVERY DAY FOR CHOLESTEROL  . Semaglutide, 1 MG/DOSE, (OZEMPIC, 1 MG/DOSE,) 2 MG/1.5ML SOPN Inject 1 mg into the skin once a week.  Marland Kitchen SYNTHROID 200 MCG tablet TAKE 1 TABLET DAILY ON AN EMPTY STOMACH WITH ONLY WATER FOR 30 MINUTES & NO ANTACID MEDS, CALCIUM OR MAGNESIUM FOR 4 HOURS & AVOID BIOTIN  . Turmeric (QC TUMERIC COMPLEX PO) Take by mouth.  . Vitamin D, Ergocalciferol, (DRISDOL) 1.25 MG (50000 UNIT) CAPS capsule Take 1 capsule 1 x /week for Vit D Deficiency  No facility-administered encounter medications on file as of 10/07/2020.    Allergies as of 10/07/2020 - Review Complete 10/07/2020  Allergen Reaction Noted  . Floxin [ofloxacin] Hives 06/12/2011  . Invokana [canagliflozin]  02/14/2020  . Viberzi [eluxadoline] Hives 07/22/2015  . Doxycycline Rash 02/11/2014  . Penicillins Rash 11/11/2019    Past Medical History:  Diagnosis Date  . Allergy   . Anxiety   . Arthritis   . Asthma   . Cataract   . Chronic kidney disease (CKD), stage II (mild) 07/01/2014   Overview:  Overview:  Due to DM, last GFR 64   . Degenerative tear of triangular fibrocartilage complex (TFCC) of left wrist 01/17/2019  . Depression   . Fibromyalgia   . GERD  (gastroesophageal reflux disease)   . Hyperlipidemia   . Hypertension   . IBS (irritable bowel syndrome)   . Liver disease   . Migraine 02/19/2015  . Migraines   . Obesity   . Osteoporosis   . Sensorineural hearing loss (SNHL) of left ear with unrestricted hearing of right ear 12/31/2017  . Sleep apnea    uses oral device  . Sleep difficulties   . Thyroid disease    hypothyroidism  . Tuberculosis    positive PPD test, chest x ray negative  . Type II or unspecified type diabetes mellitus without mention of complication, not stated as uncontrolled   . Urticaria   . Ventral hernia   . Vertigo 02/19/2015    Past Surgical History:  Procedure Laterality Date  . ABDOMINAL HYSTERECTOMY    . ADENOIDECTOMY    . CARPAL TUNNEL RELEASE     bilateral  . CATARACT EXTRACTION, BILATERAL    . CESAREAN SECTION     x2  . CHOLECYSTECTOMY    . COLONOSCOPY    . HERNIA REPAIR  07/24/11   ventral hernia  . JOINT REPLACEMENT     bilateral knees  . KNEE ARTHROSCOPY     bilateral  . SINOSCOPY    . TONSILLECTOMY    . TOTAL KNEE ARTHROPLASTY     bilateral    Family History  Problem Relation Age of Onset  . Breast cancer Mother   . Hypertension Father   . Hyperlipidemia Father   . Prostate cancer Father   . Hyperlipidemia Sister   . Esophageal cancer Son 70       Died Nov 29, 2013  . Colon cancer Son   . Stomach cancer Son   . Colon polyps Neg Hx   . Diabetes Neg Hx   . Kidney disease Neg Hx   . Migraines Neg Hx   . Rectal cancer Neg Hx     Social History   Socioeconomic History  . Marital status: Married    Spouse name: Francee Piccolo  . Number of children: 2  . Years of education: masters  . Highest education level: Not on file  Occupational History  . Occupation: Retired Pharmacist, hospital  Tobacco Use  . Smoking status: Never Smoker  . Smokeless tobacco: Never Used  Vaping Use  . Vaping Use: Never used  Substance and Sexual Activity  . Alcohol use: Yes    Alcohol/week: 0.0 standard drinks     Comment: occasional  . Drug use: No  . Sexual activity: Yes  Other Topics Concern  . Not on file  Social History Narrative   Lives with husband, Francee Piccolo   Patient is right handed.   Patient drinks 64 oz of diet soda daily.   Social Determinants  of Health   Financial Resource Strain: Not on file  Food Insecurity: Not on file  Transportation Needs: Not on file  Physical Activity: Not on file  Stress: Not on file  Social Connections: Not on file  Intimate Partner Violence: Not on file    Review of Systems  Constitutional: Positive for fatigue.  Neurological: Positive for headaches.    Vitals:   10/07/20 1124  BP: 118/70  Pulse: 100  Temp: (!) 97.3 F (36.3 C)  SpO2: 98%     Physical Exam Constitutional:      Appearance: She is obese.  HENT:     Head: Normocephalic and atraumatic.     Nose: No congestion or rhinorrhea.     Mouth/Throat:     Pharynx: No oropharyngeal exudate or posterior oropharyngeal erythema.  Eyes:     General:        Right eye: No discharge.        Left eye: No discharge.  Cardiovascular:     Rate and Rhythm: Normal rate and regular rhythm.     Pulses: Normal pulses.     Heart sounds: No murmur heard.   Pulmonary:     Effort: Pulmonary effort is normal. No respiratory distress.     Breath sounds: Normal breath sounds. No stridor. No wheezing or rhonchi.  Musculoskeletal:     Cervical back: No rigidity or tenderness.  Neurological:     Mental Status: She is alert.  Psychiatric:        Mood and Affect: Mood normal.    Data Reviewed: Recent polysomnography 11/29-reviewed showing significant hypoxemia, oxygen level not baseline was 90% with desaturations to about 84%  Recent home sleep study 08/11/2020 revealed hypoxemia  Echocardiogram 01/05/2504 normal systolic function, no mention of pulmonary hypertension  CT abdomen and pelvis 01/15/2020 was reviewed by myself-no evidence of abnormality at the base of the lung  Cardiac CT  07/04/2019-no significant abnormality noted in lung structures  Assessment:  Hypoxemia -Unclear what is contributing to her hypoxemia at present -May be related to hypoventilation -She has no underlying lung disease, asthma has never really been a problem -No significant predisposition to lung disease  Significant weight loss over the years should be very positive  Hypoxemia nocturnally was not associated with sleep disordered breathing events  Possibility of pulmonary hypertension-was not present on an echocardiogram that was performed in 2020  X-ray, CT scans do not indicate underlying lung disease  Plan/Recommendations: Obtain a pulmonary function test  Obtain echocardiogram-dyspnea on exertion  Oxygen supplementation at 3 L with a follow-up oximetry to ascertain adequate supplementation  Importance of graded exercises to challenge respiratory muscles discussed with patient -She does have a treadmill at home -Membership at a gym -Encouraged to start graded exercises as tolerated  I will follow-up with her in 6 to 8 weeks  I spent 45 minutes dedicated to the care of this patient on the date of this encounter to include pre-visit review of records, face-to-face time with the patient discussing conditions above, recent studies were reviewed including CT, sleep study with the patient, post visit ordering of testing, clinical documentation with the electronic health record, making appropriate referrals as documented, and communicating necessary findings to members of the patients care team.   Sherrilyn Rist MD [facility] 10/07/2020, 11:51 AM  CC: Grace Pinto, MD

## 2020-10-07 NOTE — Patient Instructions (Signed)
Oxygen supplementation -3 L of oxygen at night -Recent sleep study did reveal oxygen dipping to about 84 while sleeping  .  Obtain echocardiogram.-Make sure you do not have pulmonary hypertension, was not present on echocardiogram performed in 2020  .  Obtain breathing study .  Graded exercises as tolerated  .  I will see you back in 6 to 8 weeks  .  Call with significant concerns

## 2020-10-08 ENCOUNTER — Ambulatory Visit: Payer: Self-pay | Admitting: Urology

## 2020-10-13 ENCOUNTER — Telehealth: Payer: Self-pay | Admitting: *Deleted

## 2020-10-13 NOTE — Telephone Encounter (Signed)
Returned call to patient regarding pain under left ribs since 10/10/2020. Per Dr Melford Aase, patient was advised to go to a [facility] urgent care, since she may need x-rays. Patient is aware.

## 2020-10-19 ENCOUNTER — Encounter (HOSPITAL_BASED_OUTPATIENT_CLINIC_OR_DEPARTMENT_OTHER): Payer: Self-pay

## 2020-10-19 ENCOUNTER — Emergency Department (HOSPITAL_BASED_OUTPATIENT_CLINIC_OR_DEPARTMENT_OTHER)
Admission: EM | Admit: 2020-10-19 | Discharge: 2020-10-19 | Disposition: A | Discharge status: Home / Self Care | Payer: Medicare HMO | Attending: Emergency Medicine | Admitting: Emergency Medicine

## 2020-10-19 ENCOUNTER — Other Ambulatory Visit: Payer: Self-pay

## 2020-10-19 ENCOUNTER — Emergency Department (HOSPITAL_BASED_OUTPATIENT_CLINIC_OR_DEPARTMENT_OTHER): Payer: Medicare HMO

## 2020-10-19 DIAGNOSIS — R140 Abdominal distension (gaseous): Secondary | ICD-10-CM | POA: Diagnosis not present

## 2020-10-19 DIAGNOSIS — N182 Chronic kidney disease, stage 2 (mild): Secondary | ICD-10-CM | POA: Diagnosis not present

## 2020-10-19 DIAGNOSIS — Z96653 Presence of artificial knee joint, bilateral: Secondary | ICD-10-CM | POA: Diagnosis not present

## 2020-10-19 DIAGNOSIS — E039 Hypothyroidism, unspecified: Secondary | ICD-10-CM | POA: Insufficient documentation

## 2020-10-19 DIAGNOSIS — R1013 Epigastric pain: Secondary | ICD-10-CM | POA: Diagnosis not present

## 2020-10-19 DIAGNOSIS — Z794 Long term (current) use of insulin: Secondary | ICD-10-CM | POA: Insufficient documentation

## 2020-10-19 DIAGNOSIS — Z7984 Long term (current) use of oral hypoglycemic drugs: Secondary | ICD-10-CM | POA: Diagnosis not present

## 2020-10-19 DIAGNOSIS — J45909 Unspecified asthma, uncomplicated: Secondary | ICD-10-CM | POA: Insufficient documentation

## 2020-10-19 DIAGNOSIS — I10 Essential (primary) hypertension: Secondary | ICD-10-CM | POA: Diagnosis not present

## 2020-10-19 DIAGNOSIS — Z79899 Other long term (current) drug therapy: Secondary | ICD-10-CM | POA: Insufficient documentation

## 2020-10-19 DIAGNOSIS — I129 Hypertensive chronic kidney disease with stage 1 through stage 4 chronic kidney disease, or unspecified chronic kidney disease: Secondary | ICD-10-CM | POA: Insufficient documentation

## 2020-10-19 DIAGNOSIS — Z7982 Long term (current) use of aspirin: Secondary | ICD-10-CM | POA: Diagnosis not present

## 2020-10-19 DIAGNOSIS — E1122 Type 2 diabetes mellitus with diabetic chronic kidney disease: Secondary | ICD-10-CM | POA: Insufficient documentation

## 2020-10-19 HISTORY — DX: Hypoxemia: R09.02

## 2020-10-19 LAB — CBC
HCT: 43.8 % (ref 36.0–46.0)
Hemoglobin: 14.4 g/dL (ref 12.0–15.0)
MCH: 29.1 pg (ref 26.0–34.0)
MCHC: 32.9 g/dL (ref 30.0–36.0)
MCV: 88.5 fL (ref 80.0–100.0)
Platelets: 278 10*3/uL (ref 150–400)
RBC: 4.95 MIL/uL (ref 3.87–5.11)
RDW: 13.2 % (ref 11.5–15.5)
WBC: 11.1 10*3/uL — ABNORMAL HIGH (ref 4.0–10.5)
nRBC: 0 % (ref 0.0–0.2)

## 2020-10-19 LAB — COMPREHENSIVE METABOLIC PANEL WITH GFR
ALT: 30 U/L (ref 0–44)
AST: 30 U/L (ref 15–41)
Albumin: 4.1 g/dL (ref 3.5–5.0)
Alkaline Phosphatase: 73 U/L (ref 38–126)
Anion gap: 12 (ref 5–15)
BUN: 12 mg/dL (ref 8–23)
CO2: 23 mmol/L (ref 22–32)
Calcium: 9.5 mg/dL (ref 8.9–10.3)
Chloride: 100 mmol/L (ref 98–111)
Creatinine, Ser: 0.88 mg/dL (ref 0.44–1.00)
GFR, Estimated: >60 mL/min (ref >60)
Glucose, Bld: 129 mg/dL — ABNORMAL HIGH (ref 70–99)
Potassium: 3.5 mmol/L (ref 3.5–5.1)
Sodium: 135 mmol/L (ref 135–145)
Total Bilirubin: 0.6 mg/dL (ref 0.3–1.2)
Total Protein: 7 g/dL (ref 6.5–8.1)

## 2020-10-19 LAB — URINALYSIS, ROUTINE W REFLEX MICROSCOPIC
Bilirubin Urine: NEGATIVE
Glucose, UA: NEGATIVE mg/dL
Ketones, ur: NEGATIVE mg/dL
Leukocytes,Ua: NEGATIVE
Nitrite: NEGATIVE
Protein, ur: NEGATIVE mg/dL
Specific Gravity, Urine: <1.005 — ABNORMAL LOW (ref 1.005–1.030)
pH: 6 (ref 5.0–8.0)

## 2020-10-19 LAB — URINALYSIS, MICROSCOPIC (REFLEX)

## 2020-10-19 LAB — LIPASE, BLOOD: Lipase: 33 U/L (ref 11–51)

## 2020-10-19 MED ORDER — FAMOTIDINE IN NACL 20-0.9 MG/50ML-% IV SOLN
20.0000 mg | Freq: Once | INTRAVENOUS | Status: AC
  Administered 2020-10-19: 16:00:00 20 mg via INTRAVENOUS
  Filled 2020-10-19: qty 50

## 2020-10-19 MED ORDER — IOHEXOL 300 MG/ML  SOLN
100.0000 mL | Freq: Once | INTRAMUSCULAR | Status: AC | PRN
  Administered 2020-10-19: 16:00:00 100 mL via INTRAVENOUS

## 2020-10-19 MED ORDER — SODIUM CHLORIDE 0.9 % IV BOLUS
1000.0000 mL | Freq: Once | INTRAVENOUS | Status: AC
  Administered 2020-10-19: 16:00:00 1000 mL via INTRAVENOUS

## 2020-10-19 NOTE — ED Notes (Signed)
Patient transported to CT 

## 2020-10-19 NOTE — ED Triage Notes (Signed)
Pt c/o abd pain, increase in gas/bloating x 2 weeks-states pain is worse after eating-NAD-steady gait

## 2020-10-19 NOTE — Discharge Instructions (Signed)
Increase Pepcid to twice daily as needed.  You may increase your Prilosec to 40 mg daily.  See your doctor for follow-up.  Your CT scan did not show any masses or anything concerning  If you have persistent pain you will need to follow-up with your GI doctor and get the endoscopy  Return to ER if you have worse abdominal pain, vomiting, chest pain, trouble breathing

## 2020-10-19 NOTE — ED Notes (Signed)
Patient denies pain and is resting comfortably.  

## 2020-10-19 NOTE — ED Provider Notes (Signed)
[facility] EMERGENCY DEPARTMENT Provider Note   CSN: [id number] Arrival date & time: 10/19/20  1334     History Chief Complaint  Patient presents with  . Abdominal Pain    Grace Barry is a 69 y.o. female hx of reflux, HTN, HL, here with abdominal pain. Epigastric pain for the last few weeks, it is sharp and lasts several hours and usually after eating. Denies any chest pain or shortness of breath. Patient is on pepcid and omeprazole already. Patient is traveling to visit family soon and wants to get checked out.   The history is provided by the patient.       Past Medical History:  Diagnosis Date  . Allergy   . Anxiety   . Arthritis   . Asthma   . Cataract   . Chronic kidney disease (CKD), stage II (mild) 07/01/2014   Overview:  Overview:  Due to DM, last GFR 64   . Degenerative tear of triangular fibrocartilage complex (TFCC) of left wrist 01/17/2019  . Depression   . Fibromyalgia   . GERD (gastroesophageal reflux disease)   . Hyperlipidemia   . Hypertension   . Hypoxemia   . IBS (irritable bowel syndrome)   . Liver disease   . Migraine 02/19/2015  . Migraines   . Obesity   . Osteoporosis   . Sensorineural hearing loss (SNHL) of left ear with unrestricted hearing of right ear 12/31/2017  . Sleep apnea    uses oral device  . Sleep difficulties   . Thyroid disease    hypothyroidism  . Tuberculosis    positive PPD test, chest x ray negative  . Type II or unspecified type diabetes mellitus without mention of complication, not stated as uncontrolled   . Urticaria   . Ventral hernia   . Vertigo 02/19/2015    Patient Active Problem List   Diagnosis Date Noted  . Hypoxemia associated with sleep 10/02/2020  . Leukocytosis 09/27/2020  . Atypical lymphocytes present on peripheral blood smear 09/27/2020  . Tremor 07/28/2020  . Uncontrolled REM sleep behavior disorder 07/20/2020  . Sleep paralysis, recurrent isolated 07/20/2020  . Bad dreams 07/20/2020  .  Sleep related headaches 07/20/2020  . Fatigue due to depression 07/20/2020  . Psychophysiological insomnia 07/20/2020  . Aortic atherosclerosis ([place]) 12/12/2019  . Pain 01/17/2019  . Hyperlipidemia associated with type 2 diabetes mellitus ([place]) 11/07/2018  . Osteopenia 11/07/2018  . Seasonal and perennial allergic rhinitis 07/17/2018  . Vitamin D deficiency disease 05/19/2015  . Medication management 05/19/2015  . Migraine 02/19/2015  . Vertigo 02/19/2015  . History of colonic polyps 01/15/2015  . IBS (irritable bowel syndrome) 09/01/2014  . Morbid obesity ([place]) 07/01/2014  . CKD stage 2 due to type 2 diabetes mellitus ([place]) 07/01/2014  . Fatty liver   . Arthritis   . Hypothyroidism 12/15/2010  . T2_NIDDM w/CKD (GFR 56 ml/min) 12/15/2010  . Depression, major, recurrent, in remission ([place]) 12/15/2010  . Obstructive sleep apnea 12/15/2010  . Essential hypertension 12/15/2010  . GERD 12/15/2010    Past Surgical History:  Procedure Laterality Date  . ABDOMINAL HYSTERECTOMY    . ADENOIDECTOMY    . CARPAL TUNNEL RELEASE     bilateral  . CATARACT EXTRACTION, BILATERAL    . CESAREAN SECTION     x2  . CHOLECYSTECTOMY    . COLONOSCOPY    . HERNIA REPAIR  07/24/11   ventral hernia  . JOINT REPLACEMENT     bilateral knees  .  KNEE ARTHROSCOPY     bilateral  . SINOSCOPY    . TONSILLECTOMY    . TOTAL KNEE ARTHROPLASTY     bilateral     OB History   No obstetric history on file.     Family History  Problem Relation Age of Onset  . Breast cancer Mother   . Hypertension Father   . Hyperlipidemia Father   . Prostate cancer Father   . Hyperlipidemia Sister   . Esophageal cancer Son 76       Died 11/29/2013  . Colon cancer Son   . Stomach cancer Son   . Colon polyps Neg Hx   . Diabetes Neg Hx   . Kidney disease Neg Hx   . Migraines Neg Hx   . Rectal cancer Neg Hx     Social History   Tobacco Use  . Smoking status: Never Smoker  . Smokeless tobacco: Never Used   Vaping Use  . Vaping Use: Never used  Substance Use Topics  . Alcohol use: Yes    Alcohol/week: 0.0 standard drinks    Comment: occasional  . Drug use: No    Home Medications Prior to Admission medications   Medication Sig Start Date End Date Taking? Authorizing Provider  aspirin 81 MG tablet Take 81 mg by mouth daily.    [provider]  baclofen (LIORESAL) 10 MG tablet Take 1 tablet (10 mg total) by mouth 2 (two) times daily as needed for muscle spasms. 07/28/20   Suzzanne Cloud, NP  CALCIUM PO Take 1,200 mg by mouth daily.    [provider]  Cyanocobalamin (VITAMIN B12 SL) Place 1 tablet under the tongue daily.    [provider]  DULoxetine (CYMBALTA) 60 MG capsule Take 1 capsule (60 mg total) by mouth daily. 07/20/20 07/20/21  Vladimir Crofts, PA-C  EPINEPHrine (EPIPEN 2-PAK) 0.3 mg/0.3 mL IJ SOAJ injection Inject 0.3 mg into the muscle once.    [provider]  famotidine (PEPCID) 40 MG tablet Take 1 tab daily as needed for refllux 09/21/20   Liane Comber, NP  glucose blood (ONE TOUCH ULTRA TEST) test strip Check Blood sugar Daily for diabetes 02/18/20   Vicie Mutters R, PA-C  Lancets [facility] ULTRASOFT) lancets Check Blood sugar  daily 05/22/18   Unk Pinto, MD  losartan (COZAAR) 100 MG tablet Take      1 tablet       Daily         for BP and Kidney protection 10/05/20   Unk Pinto, MD  Magnesium 250 MG TABS Take 250 mg by mouth 2 (two) times daily.    [provider]  metFORMIN (GLUCOPHAGE-XR) 500 MG 24 hr tablet Take 2 tablets 2 x /day with a meal for Diabetes 12/24/19   Unk Pinto, MD  metoprolol succinate (TOPROL-XL) 50 MG 24 hr tablet Take 1 tablet (50 mg total) by mouth daily. Take with or immediately following a meal. 07/28/20   Suzzanne Cloud, NP  Multiple Vitamins-Minerals (MULTIVITAMIN PO) Take 1 tablet by mouth daily.     [provider]  Omega-3 Fatty Acids (FISH OIL PO) Take by mouth daily.     [provider]  omeprazole (PRILOSEC) 20 MG capsule Take 1 capsule (20 mg total) by mouth daily. 1 hour prior to bedtime. 09/21/20   Liane Comber, NP  PRESCRIPTION MEDICATION Inject as directed every 30 (thirty) days. Allergy Shot    [provider]  rosuvastatin (CRESTOR) 10  MG tablet TAKE 1 TABLET BY MOUTH EVERY DAY FOR CHOLESTEROL 09/22/20   Liane Comber, NP  Semaglutide, 1 MG/DOSE, (OZEMPIC, 1 MG/DOSE,) 2 MG/1.5ML SOPN Inject 1 mg into the skin once a week. 07/20/20   Vicie Mutters R, PA-C  SYNTHROID 200 MCG tablet TAKE 1 TABLET DAILY ON AN EMPTY STOMACH WITH ONLY WATER FOR 30 MINUTES & NO ANTACID MEDS, CALCIUM OR MAGNESIUM FOR 4 HOURS & AVOID BIOTIN 05/27/20   Unk Pinto, MD  Turmeric (QC TUMERIC COMPLEX PO) Take by mouth.    [provider]  Vitamin D, Ergocalciferol, (DRISDOL) 1.25 MG (50000 UNIT) CAPS capsule Take 1 capsule 1 x /week for Vit D Deficiency 09/22/20   Liane Comber, NP    Allergies    Floxin [ofloxacin], Invokana [canagliflozin], Viberzi [eluxadoline], Doxycycline, and Penicillins  Review of Systems   Review of Systems  Gastrointestinal: Positive for abdominal pain.  All other systems reviewed and are negative.   Physical Exam Updated Vital Signs BP (!) 152/80 (BP Location: Right Arm)   Pulse 100   Temp 98.5 F (36.9 C) (Oral)   Resp 18   Ht 5\' 3"  (1.6 m)   Wt 92.3 kg   SpO2 96%   BMI 36.03 kg/m   Physical Exam Vitals and nursing note reviewed.  HENT:     Head: Normocephalic.     Mouth/Throat:     Mouth: Mucous membranes are moist.  Eyes:     Extraocular Movements: Extraocular movements intact.  Cardiovascular:     Rate and Rhythm: Normal rate and regular rhythm.     Heart sounds: Normal heart sounds.  Pulmonary:     Effort: Pulmonary effort is normal.     Breath sounds: Normal breath sounds.  Abdominal:     Comments: + epigastric tenderness   Skin:    General: Skin is warm.     Capillary Refill:  Capillary refill takes less than 2 seconds.  Neurological:     General: No focal deficit present.     Mental Status: She is alert and oriented to person, place, and time.  Psychiatric:        Mood and Affect: Mood normal.        Behavior: Behavior normal.     ED Results / Procedures / Treatments   Labs (all labs ordered are listed, but only abnormal results are displayed) Labs Reviewed  COMPREHENSIVE METABOLIC PANEL - Abnormal; Notable for the following components:      Result Value   Glucose, Bld 129 (*)    All other components within normal limits  CBC - Abnormal; Notable for the following components:   WBC 11.1 (*)    All other components within normal limits  URINALYSIS, ROUTINE W REFLEX MICROSCOPIC - Abnormal; Notable for the following components:   Specific Gravity, Urine <1.005 (*)    Hgb urine dipstick TRACE (*)    All other components within normal limits  URINALYSIS, MICROSCOPIC (REFLEX) - Abnormal; Notable for the following components:   Bacteria, UA FEW (*)    All other components within normal limits  LIPASE, BLOOD    EKG EKG Interpretation  Date/Time:  Tuesday October 19 2020 13:43:30 EST Ventricular Rate:  93 PR Interval:  130 QRS Duration: 68 QT Interval:  348 QTC Calculation: 432 R Axis:   4 Text Interpretation: Normal sinus rhythm Low voltage QRS Cannot rule out Anterior infarct , age undetermined Abnormal ECG No STEMI Confirmed by Nanda Quinton [phone]) on 10/19/2020 2:00:04 PM  Radiology No results found.  Procedures Procedures (including critical care time)  Medications Ordered in ED Medications  famotidine (PEPCID) IVPB 20 mg premix (has no administration in time range)  sodium chloride 0.9 % bolus 1,000 mL (has no administration in time range)    ED Course  I have reviewed the triage vital signs and the nursing notes.  Pertinent labs & imaging results that were available during my care of the patient were reviewed by me and considered  in my medical decision making (see chart for details).    MDM Rules/Calculators/A&P                         Grace Barry is a 69 y.o. female here with epigastric pain after eating. Likely gastritis vs PUD. She has multiple abdominal surgeries including cholecystectomy. Plan to get CBC, CMP, lipase, UA, CT ab/pel. I doubt ACS.   4:56 PM Labs are unremarkable.  CT abdomen pelvis unremarkable.  UA normal.  I suspect some gastritis.  She felt better after Pepcid.  Recommend Pepcid twice daily and increase her Prilosec to 40 mg.  I told her that if she has persistent pain she may need endoscopy with her GI doctor.   Final Clinical Impression(s) / ED Diagnoses Final diagnoses:  None    Rx / DC Orders ED Discharge Orders    None       Drenda Freeze, MD 10/19/20 1657

## 2020-10-20 ENCOUNTER — Ambulatory Visit (INDEPENDENT_AMBULATORY_CARE_PROVIDER_SITE_OTHER): Payer: Medicare HMO

## 2020-10-20 DIAGNOSIS — J309 Allergic rhinitis, unspecified: Secondary | ICD-10-CM | POA: Diagnosis not present

## 2020-11-05 DIAGNOSIS — Z20822 Contact with and (suspected) exposure to covid-19: Secondary | ICD-10-CM | POA: Diagnosis not present

## 2020-11-11 DIAGNOSIS — R0902 Hypoxemia: Secondary | ICD-10-CM | POA: Diagnosis not present

## 2020-11-11 DIAGNOSIS — G4733 Obstructive sleep apnea (adult) (pediatric): Secondary | ICD-10-CM | POA: Diagnosis not present

## 2020-11-16 ENCOUNTER — Ambulatory Visit: Payer: Medicare HMO | Admitting: Allergy & Immunology

## 2020-11-16 ENCOUNTER — Other Ambulatory Visit (HOSPITAL_COMMUNITY): Payer: Medicare HMO

## 2020-11-17 ENCOUNTER — Other Ambulatory Visit (HOSPITAL_COMMUNITY): Payer: BC Managed Care – PPO

## 2020-11-23 ENCOUNTER — Encounter: Payer: Self-pay | Admitting: Allergy & Immunology

## 2020-11-23 ENCOUNTER — Ambulatory Visit: Payer: Medicare HMO | Admitting: Allergy & Immunology

## 2020-11-23 ENCOUNTER — Other Ambulatory Visit: Payer: Self-pay

## 2020-11-23 VITALS — BP 118/70 | HR 93 | Temp 97.1°F | Resp 18 | Ht 62.6 in | Wt 198.8 lb

## 2020-11-23 DIAGNOSIS — J309 Allergic rhinitis, unspecified: Secondary | ICD-10-CM

## 2020-11-23 DIAGNOSIS — J302 Other seasonal allergic rhinitis: Secondary | ICD-10-CM

## 2020-11-23 NOTE — Patient Instructions (Addendum)
1. Allergic rhinoconjunctivitis - on allergy shots - Continue with allergy shots at the same schedule.  - Continue with allergy shots every 4 weeks.   2. Mild intermittent asthma, uncomplicated - Continue albuterol 4 puffs every 4-6 hours as needed. - We are going to remove this from your diagnosis list.  3. Return in about 1 year (around 11/23/2021).   Please inform us of any Emergency Department visits, hospitalizations, or changes in symptoms. Call us before going to the ED for breathing or allergy symptoms since we might be able to fit you in for a sick visit. Feel free to contact us anytime with any questions, problems, or concerns.  It was a pleasure to see you again today! THANK YOU FOR THE KRINGLE!! You are adorable!   Websites that have reliable patient information: 1. American Academy of Asthma, Allergy, and Immunology: www.aaaai.org 2. Food Allergy Research and Education (FARE): foodallergy.org 3. Mothers of Asthmatics: http://www.asthmacommunitynetwork.org 4. American College of Allergy, Asthma, and Immunology: www.acaai.org   COVID-19 Vaccine Information can be found at: ShippingScam.co.uk For questions related to vaccine distribution or appointments, please email [email or web address] or call [phone].     "Like" [place] on Facebook and Instagram for our latest updates!       Make sure you are registered to vote! If you have moved or changed any of your contact information, you will need to get this updated before voting!  In some cases, you MAY be able to register to vote online: [email or web address]

## 2020-11-23 NOTE — Progress Notes (Signed)
FOLLOW UP  Date of Service/Encounter:  11/23/20   Assessment:   Seasonal and perennial allergic rhinitis - stable on allergen immunotherapy  Plan/Recommendations:   1. Allergic rhinoconjunctivitis - on allergy shots - Continue with allergy shots at the same schedule.  - Continue with allergy shots every 4 weeks.   2. Mild intermittent asthma, uncomplicated - Continue albuterol 4 puffs every 4-6 hours as needed. - We are going to remove this from your diagnosis list.  3. Return in about 1 year (around 11/23/2021).    Subjective:   Grace Barry is a 70 y.o. female presenting today for follow up of  Chief Complaint  Patient presents with  . Allergic Rhinitis     Grace Barry has a history of the following: Patient Active Problem List   Diagnosis Date Noted  . Hypoxemia associated with sleep 10/02/2020  . Leukocytosis 09/27/2020  . Atypical lymphocytes present on peripheral blood smear 09/27/2020  . Tremor 07/28/2020  . Uncontrolled REM sleep behavior disorder 07/20/2020  . Sleep paralysis, recurrent isolated 07/20/2020  . Bad dreams 07/20/2020  . Sleep related headaches 07/20/2020  . Fatigue due to depression 07/20/2020  . Psychophysiological insomnia 07/20/2020  . Aortic atherosclerosis ([place]) 12/12/2019  . Pain 01/17/2019  . Hyperlipidemia associated with type 2 diabetes mellitus ([place]) 11/07/2018  . Osteopenia 11/07/2018  . Seasonal and perennial allergic rhinitis 07/17/2018  . Vitamin D deficiency disease 05/19/2015  . Medication management 05/19/2015  . Migraine 02/19/2015  . Vertigo 02/19/2015  . History of colonic polyps 01/15/2015  . IBS (irritable bowel syndrome) 09/01/2014  . Morbid obesity ([place]) 07/01/2014  . CKD stage 2 due to type 2 diabetes mellitus ([place]) 07/01/2014  . Fatty liver   . Arthritis   . Hypothyroidism 12/15/2010  . T2_NIDDM w/CKD (GFR 56 ml/min) 12/15/2010  . Depression, major, recurrent, in remission ([place]) 12/15/2010  .  Obstructive sleep apnea 12/15/2010  . Essential hypertension 12/15/2010  . GERD 12/15/2010    History obtained from: chart review and patient.  Grace Barry is a 70 y.o. female presenting for a follow up visit.  She has a history of mild intermittent asthma as well as seasonal perennial allergic rhinitis.  She was last seen in January 2021.  At that time, we continue with allergy shots at the same schedule.  For her asthma, we continue with albuterol as needed but ended up removing the diagnosis completely since she had not needed her albuterol in years.  Since last visit, she has done very well. She went to [place] over the holidays. She did have some snow there. They are 2 and 5. She also has a 72yo in [place]. [place]. She actually brought me a cherry kringle today from [place].  [name] is on allergen immunotherapy. She receives two injections. Immunotherapy script #1 contains molds, cat and dog. She currently receives 0.19mL of the RED vial (1/100). Immunotherapy script #2 contains trees, weeds and grasses. She currently receives 0.42mL of the RED vial (1/100). She started shots 2011 and reached maintenance in 2012. She is getting them monthly. She denies reactions.  She has had no sinus infections.  She does not use any allergy medications on a routine basis.  She is open to stopping her allergy shots, but prefers to keep going for now since it is not too much of an inconvenience to come once a month.  Otherwise, there have been no changes to her past medical history, surgical history, family history, or social history.  Review of Systems  Constitutional: Negative.  Negative for fever, malaise/fatigue and weight loss.  HENT: Negative.  Negative for congestion, ear discharge and ear pain.   Eyes: Negative for pain, discharge and redness.  Respiratory: Negative for cough, sputum production, shortness of breath and wheezing.   Cardiovascular: Negative.  Negative for chest pain and palpitations.   Gastrointestinal: Negative for abdominal pain and heartburn.  Skin: Negative.  Negative for itching and rash.  Neurological: Negative for dizziness and headaches.  Endo/Heme/Allergies: Negative for environmental allergies. Does not bruise/bleed easily.       Objective:   Blood pressure 118/70, pulse 93, temperature (!) 97.1 F (36.2 C), temperature source Temporal, resp. rate 18, height 5' 2.6" (1.59 m), weight 198 lb 12.8 oz (90.2 kg), SpO2 95 %. Body mass index is 35.67 kg/m.   Physical Exam:  Physical Exam Constitutional:      Appearance: She is well-developed.  HENT:     Head: Normocephalic and atraumatic.     Right Ear: Tympanic membrane, ear canal and external ear normal.     Left Ear: Tympanic membrane and ear canal normal.     Nose: No nasal deformity, septal deviation, mucosal edema, rhinorrhea or epistaxis.     Right Sinus: No maxillary sinus tenderness or frontal sinus tenderness.     Left Sinus: No maxillary sinus tenderness or frontal sinus tenderness.     Mouth/Throat:     Mouth: Oropharynx is clear and moist. Mucous membranes are not pale and not dry.     Pharynx: Uvula midline.  Eyes:     General:        Right eye: No discharge.        Left eye: No discharge.     Extraocular Movements: EOM normal.     Conjunctiva/sclera: Conjunctivae normal.     Right eye: Right conjunctiva is not injected. No chemosis.    Left eye: Left conjunctiva is not injected. No chemosis.    Pupils: Pupils are equal, round, and reactive to light.  Cardiovascular:     Rate and Rhythm: Normal rate and regular rhythm.     Heart sounds: Normal heart sounds.  Pulmonary:     Effort: Pulmonary effort is normal. No tachypnea, accessory muscle usage or respiratory distress.     Breath sounds: Normal breath sounds. No wheezing, rhonchi or rales.  Chest:     Chest wall: No tenderness.  Lymphadenopathy:     Cervical: No cervical adenopathy.  Skin:    Coloration: Skin is not pale.      Findings: No abrasion, erythema, petechiae or rash. Rash is not papular, urticarial or vesicular.  Neurological:     Mental Status: She is alert.  Psychiatric:        Mood and Affect: Mood and affect normal.      Diagnostic studies: none         Salvatore Marvel, MD  [facility]

## 2020-11-24 ENCOUNTER — Other Ambulatory Visit: Payer: Self-pay | Admitting: Adult Health

## 2020-11-24 ENCOUNTER — Other Ambulatory Visit: Payer: Self-pay | Admitting: Internal Medicine

## 2020-11-24 DIAGNOSIS — N183 Type 2 diabetes mellitus with diabetic chronic kidney disease: Secondary | ICD-10-CM

## 2020-11-24 DIAGNOSIS — E1122 Type 2 diabetes mellitus with diabetic chronic kidney disease: Secondary | ICD-10-CM

## 2020-11-24 MED ORDER — OZEMPIC (1 MG/DOSE) 2 MG/1.5ML ~~LOC~~ SOPN: 1.0000 mg | PEN_INJECTOR | SUBCUTANEOUS | 1 refills | Status: DC

## 2020-12-01 ENCOUNTER — Encounter: Payer: Self-pay | Admitting: Neurology

## 2020-12-03 ENCOUNTER — Other Ambulatory Visit (HOSPITAL_COMMUNITY)
Admission: RE | Admit: 2020-12-03 | Discharge: 2020-12-03 | Disposition: A | Discharge status: Home / Self Care | Payer: Medicare HMO | Source: Ambulatory Visit | Attending: Pulmonary Disease | Admitting: Pulmonary Disease

## 2020-12-03 DIAGNOSIS — Z01812 Encounter for preprocedural laboratory examination: Secondary | ICD-10-CM | POA: Diagnosis not present

## 2020-12-03 DIAGNOSIS — Z20822 Contact with and (suspected) exposure to covid-19: Secondary | ICD-10-CM | POA: Diagnosis not present

## 2020-12-03 LAB — SARS CORONAVIRUS 2 (TAT 6-24 HRS): SARS Coronavirus 2: NEGATIVE

## 2020-12-06 ENCOUNTER — Ambulatory Visit: Payer: Medicare HMO | Admitting: Pulmonary Disease

## 2020-12-06 ENCOUNTER — Ambulatory Visit (INDEPENDENT_AMBULATORY_CARE_PROVIDER_SITE_OTHER): Payer: Medicare HMO | Admitting: Adult Health

## 2020-12-06 ENCOUNTER — Encounter: Payer: Self-pay | Admitting: Adult Health

## 2020-12-06 ENCOUNTER — Other Ambulatory Visit: Payer: Self-pay

## 2020-12-06 ENCOUNTER — Ambulatory Visit (INDEPENDENT_AMBULATORY_CARE_PROVIDER_SITE_OTHER): Payer: Medicare HMO | Admitting: Pulmonary Disease

## 2020-12-06 ENCOUNTER — Ambulatory Visit (INDEPENDENT_AMBULATORY_CARE_PROVIDER_SITE_OTHER): Payer: Medicare HMO

## 2020-12-06 VITALS — BP 134/70 | HR 110 | Temp 97.0°F | Ht 63.0 in | Wt 201.0 lb

## 2020-12-06 DIAGNOSIS — G4734 Idiopathic sleep related nonobstructive alveolar hypoventilation: Secondary | ICD-10-CM

## 2020-12-06 DIAGNOSIS — G4733 Obstructive sleep apnea (adult) (pediatric): Secondary | ICD-10-CM | POA: Diagnosis not present

## 2020-12-06 DIAGNOSIS — R0600 Dyspnea, unspecified: Secondary | ICD-10-CM | POA: Diagnosis not present

## 2020-12-06 DIAGNOSIS — G4736 Sleep related hypoventilation in conditions classified elsewhere: Secondary | ICD-10-CM

## 2020-12-06 DIAGNOSIS — R0902 Hypoxemia: Secondary | ICD-10-CM | POA: Diagnosis not present

## 2020-12-06 DIAGNOSIS — R0609 Other forms of dyspnea: Secondary | ICD-10-CM

## 2020-12-06 LAB — PULMONARY FUNCTION TEST
DL/VA % pred: 134 %
DL/VA: 5.59 ml/min/mmHg/L
DLCO cor % pred: 127 %
DLCO cor: 23.95 ml/min/mmHg
DLCO unc % pred: 127 %
DLCO unc: 23.95 ml/min/mmHg
FEF 25-75 Post: 2.01 L/sec
FEF 25-75 Pre: 2.06 L/sec
FEF2575-%Change-Post: -2 %
FEF2575-%Pred-Post: 112 %
FEF2575-%Pred-Pre: 114 %
FEV1-%Change-Post: 0 %
FEV1-%Pred-Post: 97 %
FEV1-%Pred-Pre: 97 %
FEV1-Post: 2.09 L
FEV1-Pre: 2.1 L
FEV1FVC-%Change-Post: 1 %
FEV1FVC-%Pred-Pre: 106 %
FEV6-%Change-Post: -2 %
FEV6-%Pred-Post: 93 %
FEV6-%Pred-Pre: 95 %
FEV6-Post: 2.54 L
FEV6-Pre: 2.6 L
FEV6FVC-%Pred-Post: 104 %
FEV6FVC-%Pred-Pre: 104 %
FVC-%Change-Post: -2 %
FVC-%Pred-Post: 89 %
FVC-%Pred-Pre: 91 %
FVC-Post: 2.54 L
FVC-Pre: 2.6 L
Post FEV1/FVC ratio: 82 %
Post FEV6/FVC ratio: 100 %
Pre FEV1/FVC ratio: 81 %
Pre FEV6/FVC Ratio: 100 %
RV % pred: 93 %
RV: 2.01 L
TLC % pred: 94 %
TLC: 4.61 L

## 2020-12-06 NOTE — Progress Notes (Signed)
Full PFT performed today. °

## 2020-12-06 NOTE — Assessment & Plan Note (Signed)
Nocturnal hypoxemia questionable etiology.  May be a component of some hypoventilation.  2D echo is pending.  Check chest x-ray today.  Pulmonary function testing today is normal. Recent lab work showed no evidence of significant anemia. We will set up overnight oximetry on 2 L to make sure 2 L is adequate.  Patient would like to switch her current concentrator over to a more portable system.  That delivers continuous-flow such as a simply go machine. Once we get her overnight oximetry test results.  Order will be sent to DME.  Plan  Patient Instructions  Chest xray today .  2 D echo this week as planned  Set up ONO on 2l/m .  Order for compact concentrator (Simply go ) for night time use.  Follow up Dr. Hermina Staggers in 3-4 months and As needed

## 2020-12-06 NOTE — Progress Notes (Signed)
@Patient  ID: Grace Barry, female    DOB: 1951-05-10, 70 y.o.   MRN: [id number]  Chief Complaint  Patient presents with   Follow-up    Referring provider: Unk Pinto, MD  HPI: 70 year old female never smoker seen for pulmonary consult 2020-10-07 for nocturnal hypoxemia  TEST/EVENTS :  Recent polysomnography 11/29-reviewed showing significant hypoxemia, oxygen level not baseline was 90% with desaturations to about 84%  Recent home sleep study 08/11/2020 revealed hypoxemia  Echocardiogram [id number] normal systolic function, no mention of pulmonary hypertension  CT abdomen and pelvis 01/15/2020 was reviewed by myself-no evidence of abnormality at the base of the lung  Cardiac CT 07/04/2019-no significant abnormality noted in lung structures  12/06/2020 Follow up : Nocturnal hypoxemia Patient presents for a 14-month follow-up.  Patient was seen last visit for pulmonary consult for nocturnal hypoxemia.  Patient was set up for a sleep study that showed no significant sleep apnea but revealed hypoxemia.  She was started on oxygen at bedtime She has no known history of COPD or emphysema.  Says she did get diagnosed with mild asthma 20 years ago but has had no significant problems with this.  Does have allergies and is on immunotherapy.  Patient says over the last few year she has lost up to 50 pounds.  She was previously diagnosed with sleep apnea years ago was initially treated with CPAP.  And transition to oral appliance.  She most recently repeated her sleep study which showed no significant sleep apnea.  But nocturnal hypoxemia.  She is a never smoker..  Previous 2D echo showed no evidence of pulmonary hypertension in 2020.  Patient says she has no significant shortness of breath unless she is doing heavy activities.  O2 saturations 95% on room air today. Pulmonary function testing was completed today that showed normal lung function with FEV1 at 97%, ratio 82, no significant  bronchodilator response, DLCO 127% Patient has a repeat 2D echo scheduled for later this week. Patient says she is active at home.  Does not exercise on a routine basis.   Allergies  Allergen Reactions   Floxin [Ofloxacin] Hives   Invokana [Canagliflozin]     Hematuria- avoid class   Viberzi [Eluxadoline] Hives   Doxycycline Rash   Penicillins Rash    Immunization History  Administered Date(s) Administered   Influenza, High Dose Seasonal PF 08/10/2015, 08/10/2016, 08/28/2017, 11/07/2018, 08/07/2019, 09/21/2020   Influenza-Unspecified 08/05/2019   PFIZER(Purple Top)SARS-COV-2 Vaccination 11/19/2019, 12/10/2019, 08/05/2020   Pneumococcal Conjugate-13 07/01/2014   Pneumococcal Polysaccharide-23 10/09/2002, 05/26/2008, 12/13/2016   Td 10/09/2002, 10/30/2010   Tdap 08/10/2015   Zoster 06/20/2011    Past Medical History:  Diagnosis Date   Allergy    Anxiety    Arthritis    Asthma    Cataract    Chronic kidney disease (CKD), stage II (mild) 07/01/2014   Overview:  Overview:  Due to DM, last GFR 64    Degenerative tear of triangular fibrocartilage complex (TFCC) of left wrist 01/17/2019   Depression    Fibromyalgia    GERD (gastroesophageal reflux disease)    Hyperlipidemia    Hypertension    Hypoxemia    IBS (irritable bowel syndrome)    Liver disease    Migraine 02/19/2015   Migraines    Obesity    Osteoporosis    Sensorineural hearing loss (SNHL) of left ear with unrestricted hearing of right ear 12/31/2017   Sleep apnea    uses oral device   Sleep difficulties  Thyroid disease    hypothyroidism   Tuberculosis    positive PPD test, chest x ray negative   Type II or unspecified type diabetes mellitus without mention of complication, not stated as uncontrolled    Urticaria    Ventral hernia    Vertigo 02/19/2015    Tobacco History: Social History   Tobacco Use  Smoking Status Never Smoker  Smokeless Tobacco Never Used    Counseling given: Not Answered   Outpatient Medications Prior to Visit  Medication Sig Dispense Refill   aspirin 81 MG tablet Take 81 mg by mouth daily.     baclofen (LIORESAL) 10 MG tablet Take 1 tablet (10 mg total) by mouth 2 (two) times daily as needed for muscle spasms. 30 each 5   CALCIUM PO Take 1,200 mg by mouth daily.     Cyanocobalamin (VITAMIN B12 SL) Place 1 tablet under the tongue daily.     DULoxetine (CYMBALTA) 60 MG capsule Take 1 capsule (60 mg total) by mouth daily. 90 capsule 1   EPINEPHrine 0.3 mg/0.3 mL IJ SOAJ injection Inject 0.3 mg into the muscle once.     famotidine (PEPCID) 40 MG tablet Take 1 tab daily as needed for refllux 90 tablet 1   glucose blood (ONE TOUCH ULTRA TEST) test strip Check Blood sugar Daily for diabetes 100 each 3   Lancets (ONETOUCH ULTRASOFT) lancets Check Blood sugar  daily 100 each 3   losartan (COZAAR) 100 MG tablet Take      1 tablet       Daily         for BP and Kidney protection 90 tablet 3   Magnesium 250 MG TABS Take 250 mg by mouth 2 (two) times daily.     metFORMIN (GLUCOPHAGE-XR) 500 MG 24 hr tablet Take 2 tablets 2 x /day with a meal for Diabetes 360 tablet 3   metoprolol succinate (TOPROL-XL) 50 MG 24 hr tablet Take 1 tablet (50 mg total) by mouth daily. Take with or immediately following a meal. 90 tablet 4   Multiple Vitamins-Minerals (MULTIVITAMIN PO) Take 1 tablet by mouth daily.      Omega-3 Fatty Acids (FISH OIL PO) Take by mouth daily.     omeprazole (PRILOSEC) 20 MG capsule Take 1 capsule (20 mg total) by mouth daily. 1 hour prior to bedtime. 90 capsule 1   PRESCRIPTION MEDICATION Inject as directed every 30 (thirty) days. Allergy Shot     rosuvastatin (CRESTOR) 10 MG tablet TAKE 1 TABLET BY MOUTH EVERY DAY FOR CHOLESTEROL 90 tablet 1   Semaglutide, 1 MG/DOSE, (OZEMPIC, 1 MG/DOSE,) 2 MG/1.5ML SOPN Inject 1 mg into the skin once a week. 9 mL 1   SYNTHROID 200 MCG tablet TAKE 1 TABLET DAILY ON AN EMPTY  STOMACH WITH ONLY WATER FOR 30 MINUTES & NO ANTACID MEDS, CALCIUM OR MAGNESIUM FOR 4 HOURS & AVOID BIOTIN 90 tablet 3   Turmeric (QC TUMERIC COMPLEX PO) Take by mouth.     Vitamin D, Ergocalciferol, (DRISDOL) 1.25 MG (50000 UNIT) CAPS capsule Take 1 capsule 1 x /week for Vit D Deficiency 12 capsule 1   No facility-administered medications prior to visit.     Review of Systems:   Constitutional:   No  weight loss, night sweats,  Fevers, chills,  +fatigue, or  lassitude.  HEENT:   No headaches,  Difficulty swallowing,  Tooth/dental problems, or  Sore throat,  No sneezing, itching, ear ache, nasal congestion, post nasal drip,   CV:  No chest pain,  Orthopnea, PND, swelling in lower extremities, anasarca, dizziness, palpitations, syncope.   GI  No heartburn, indigestion, abdominal pain, nausea, vomiting, diarrhea, change in bowel habits, loss of appetite, bloody stools.   Resp:  .  No excess mucus, no productive cough,  No non-productive cough,  No coughing up of blood.  No change in color of mucus.  No wheezing.  No chest wall deformity  Skin: no rash or lesions.  GU: no dysuria, change in color of urine, no urgency or frequency.  No flank pain, no hematuria   MS:  No joint pain or swelling.  No decreased range of motion.  No back pain.    Physical Exam  BP 134/70 (BP Location: Left Arm, Patient Position: Sitting, Cuff Size: Normal)    Pulse (!) 110    Temp (!) 97 F (36.1 C) (Temporal)    Ht 5\' 3"  (1.6 m)    Wt 201 lb (91.2 kg)    SpO2 95%    BMI 35.61 kg/m   GEN: A/Ox3; pleasant , NAD, BMI 35   HEENT:  [place]/AT,   NOSE-clear, THROAT-clear, no lesions, no postnasal drip or exudate noted.   NECK:  Supple w/ fair ROM; no JVD; normal carotid impulses w/o bruits; no thyromegaly or nodules palpated; no lymphadenopathy.    RESP  Clear  P & A; w/o, wheezes/ rales/ or rhonchi. no accessory muscle use, no dullness to percussion  CARD:  RRR, no m/r/g, tr  peripheral  edema, pulses intact, no cyanosis or clubbing.  GI:   Soft & nt; nml bowel sounds; no organomegaly or masses detected.   Musco: Warm bil, no deformities or joint swelling noted.   Neuro: alert, no focal deficits noted.    Skin: Warm, no lesions or rashes    Lab Results:   BMET    Component Value Date/Time   NA 135 10/19/2020 1356   NA 140 07/02/2019 1214   K 3.5 10/19/2020 1356   CL 100 10/19/2020 1356   CO2 23 10/19/2020 1356   GLUCOSE 129 (H) 10/19/2020 1356   BUN 12 10/19/2020 1356   BUN 11 07/02/2019 1214   CREATININE 0.88 10/19/2020 1356   CREATININE 0.86 09/21/2020 1513   CALCIUM 9.5 10/19/2020 1356   GFRNONAA >60 10/19/2020 1356   GFRNONAA 68 09/21/2020 1513   GFRAA 79 09/21/2020 1513    BNP No results found for: BNP  ProBNP No results found for: PROBNP  Imaging: No results found.    PFT Results Latest Ref Rng & Units 12/06/2020  FVC-Pre L 2.60  FVC-Predicted Pre % 91  FVC-Post L 2.54  FVC-Predicted Post % 89  Pre FEV1/FVC % % 81  Post FEV1/FCV % % 82  FEV1-Pre L 2.10  FEV1-Predicted Pre % 97  FEV1-Post L 2.09  DLCO uncorrected ml/min/mmHg 23.95  DLCO UNC% % 127  DLCO corrected ml/min/mmHg 23.95  DLCO COR %Predicted % 127  DLVA Predicted % 134  TLC L 4.61  TLC % Predicted % 94  RV % Predicted % 93    No results found for: NITRICOXIDE      Assessment & Plan:   Obstructive sleep apnea Previous diagnosis of obstructive sleep apnea.  Repeat sleep study 2021 was negative for sleep apnea.  Hypoxemia associated with sleep Nocturnal hypoxemia questionable etiology.  May be a component of some hypoventilation.  2D echo is pending.  Check chest  x-ray today.  Pulmonary function testing today is normal. Recent lab work showed no evidence of significant anemia. We will set up overnight oximetry on 2 L to make sure 2 L is adequate.  Patient would like to switch her current concentrator over to a more portable system.  That delivers continuous-flow  such as a simply go machine. Once we get her overnight oximetry test results.  Order will be sent to DME.  Plan  Patient Instructions  Chest xray today .  2 D echo this week as planned  Set up ONO on 2l/m .  Order for compact concentrator (Simply go ) for night time use.  Follow up Dr. Hermina Staggers in 3-4 months and As needed            Rexene Edison, NP 12/06/2020

## 2020-12-06 NOTE — Assessment & Plan Note (Signed)
Previous diagnosis of obstructive sleep apnea.  Repeat sleep study 2021 was negative for sleep apnea.

## 2020-12-06 NOTE — Patient Instructions (Addendum)
Chest xray today .  2 D echo this week as planned  Set up ONO on 2l/m .  Order for compact concentrator (Simply go ) for night time use.  Follow up Dr. Hermina Staggers in 3-4 months and As needed

## 2020-12-07 NOTE — Telephone Encounter (Signed)
Please advise on patient mychart message  Sadly, [employer] [facility] is not very helpful about the portable oxygen concentrator.  They say it will cost 2500.00  - no exchange, etc.  they are very short with me anytime I have called.  Wanted to bill me for it immediately before I have the nighttime pulse oximetry.  Are there other options for suppliers, less expensive models?  What am I risking if I don't use it at all when I travel?

## 2020-12-08 ENCOUNTER — Other Ambulatory Visit: Payer: Self-pay

## 2020-12-08 ENCOUNTER — Ambulatory Visit (HOSPITAL_COMMUNITY): Payer: Medicare HMO | Attending: Cardiovascular Disease

## 2020-12-08 DIAGNOSIS — R0609 Other forms of dyspnea: Secondary | ICD-10-CM

## 2020-12-08 DIAGNOSIS — R0600 Dyspnea, unspecified: Secondary | ICD-10-CM | POA: Diagnosis not present

## 2020-12-08 LAB — ECHOCARDIOGRAM COMPLETE
Area-P 1/2: 3.85 cm2
S' Lateral: 2.8 cm

## 2020-12-09 NOTE — Telephone Encounter (Signed)
Call made to patient, confirmed DOB. Confirmed with patient ONO has been scheduled. NON is scheduled for Monday 12/13/20. Patient aware we will f/u with her after we get the results.   Thanks Tammy :)

## 2020-12-09 NOTE — Telephone Encounter (Signed)
Can we get [facility] to check into this to help with options. Still need to get ONO on 2l/m to see if adequate . That will decide on device needed.

## 2020-12-12 DIAGNOSIS — G4733 Obstructive sleep apnea (adult) (pediatric): Secondary | ICD-10-CM | POA: Diagnosis not present

## 2020-12-12 DIAGNOSIS — R0902 Hypoxemia: Secondary | ICD-10-CM | POA: Diagnosis not present

## 2020-12-14 DIAGNOSIS — G4730 Sleep apnea, unspecified: Secondary | ICD-10-CM | POA: Diagnosis not present

## 2020-12-14 DIAGNOSIS — R0683 Snoring: Secondary | ICD-10-CM | POA: Diagnosis not present

## 2020-12-16 ENCOUNTER — Telehealth: Payer: Self-pay | Admitting: *Deleted

## 2020-12-22 ENCOUNTER — Encounter: Payer: Self-pay | Admitting: Adult Health

## 2020-12-22 ENCOUNTER — Ambulatory Visit (INDEPENDENT_AMBULATORY_CARE_PROVIDER_SITE_OTHER): Payer: Medicare HMO | Admitting: Adult Health

## 2020-12-22 ENCOUNTER — Other Ambulatory Visit: Payer: Self-pay

## 2020-12-22 VITALS — BP 126/78 | HR 93 | Wt 203.0 lb

## 2020-12-22 DIAGNOSIS — E559 Vitamin D deficiency, unspecified: Secondary | ICD-10-CM

## 2020-12-22 DIAGNOSIS — Z79899 Other long term (current) drug therapy: Secondary | ICD-10-CM

## 2020-12-22 DIAGNOSIS — G4733 Obstructive sleep apnea (adult) (pediatric): Secondary | ICD-10-CM | POA: Diagnosis not present

## 2020-12-22 DIAGNOSIS — E1169 Type 2 diabetes mellitus with other specified complication: Secondary | ICD-10-CM

## 2020-12-22 DIAGNOSIS — Z0000 Encounter for general adult medical examination without abnormal findings: Secondary | ICD-10-CM

## 2020-12-22 DIAGNOSIS — I700 Atherosclerosis of aorta: Secondary | ICD-10-CM

## 2020-12-22 DIAGNOSIS — Z0001 Encounter for general adult medical examination with abnormal findings: Secondary | ICD-10-CM | POA: Diagnosis not present

## 2020-12-22 DIAGNOSIS — E039 Hypothyroidism, unspecified: Secondary | ICD-10-CM | POA: Diagnosis not present

## 2020-12-22 DIAGNOSIS — K589 Irritable bowel syndrome without diarrhea: Secondary | ICD-10-CM | POA: Diagnosis not present

## 2020-12-22 DIAGNOSIS — N182 Chronic kidney disease, stage 2 (mild): Secondary | ICD-10-CM

## 2020-12-22 DIAGNOSIS — R251 Tremor, unspecified: Secondary | ICD-10-CM

## 2020-12-22 DIAGNOSIS — J302 Other seasonal allergic rhinitis: Secondary | ICD-10-CM

## 2020-12-22 DIAGNOSIS — E785 Hyperlipidemia, unspecified: Secondary | ICD-10-CM

## 2020-12-22 DIAGNOSIS — E1129 Type 2 diabetes mellitus with other diabetic kidney complication: Secondary | ICD-10-CM | POA: Diagnosis not present

## 2020-12-22 DIAGNOSIS — I10 Essential (primary) hypertension: Secondary | ICD-10-CM | POA: Diagnosis not present

## 2020-12-22 DIAGNOSIS — R6889 Other general symptoms and signs: Secondary | ICD-10-CM

## 2020-12-22 DIAGNOSIS — J3089 Other allergic rhinitis: Secondary | ICD-10-CM | POA: Diagnosis not present

## 2020-12-22 DIAGNOSIS — M8580 Other specified disorders of bone density and structure, unspecified site: Secondary | ICD-10-CM

## 2020-12-22 DIAGNOSIS — R69 Illness, unspecified: Secondary | ICD-10-CM | POA: Diagnosis not present

## 2020-12-22 DIAGNOSIS — G43009 Migraine without aura, not intractable, without status migrainosus: Secondary | ICD-10-CM | POA: Diagnosis not present

## 2020-12-22 DIAGNOSIS — E1122 Type 2 diabetes mellitus with diabetic chronic kidney disease: Secondary | ICD-10-CM | POA: Diagnosis not present

## 2020-12-22 DIAGNOSIS — Z86010 Personal history of colonic polyps: Secondary | ICD-10-CM

## 2020-12-22 DIAGNOSIS — K219 Gastro-esophageal reflux disease without esophagitis: Secondary | ICD-10-CM

## 2020-12-22 DIAGNOSIS — Z1231 Encounter for screening mammogram for malignant neoplasm of breast: Secondary | ICD-10-CM

## 2020-12-22 DIAGNOSIS — F3340 Major depressive disorder, recurrent, in remission, unspecified: Secondary | ICD-10-CM

## 2020-12-22 DIAGNOSIS — K760 Fatty (change of) liver, not elsewhere classified: Secondary | ICD-10-CM

## 2020-12-22 MED ORDER — FLUTICASONE PROPIONATE 50 MCG/ACT NA SUSP: 1.0000 | Freq: Every day | NASAL | 1 refills | Status: DC

## 2020-12-22 NOTE — Patient Instructions (Addendum)
  Grace Barry , Thank you for taking time to come for your Medicare Wellness Visit. I appreciate your ongoing commitment to your health goals. Please review the following plan we discussed and let me know if I can assist you in the future.   These are the goals we discussed: Goals    . Weight (lb) < 190 lb (86.2 kg)     Try to cut back on chocolate, avoid emotional/habitual eating Check weight once a week        This is a list of the screening recommended for you and due dates:  Health Maintenance  Topic Date Due  . Eye exam for diabetics  09/25/2018  . Mammogram  11/19/2020  . Complete foot exam   12/14/2020  . Hemoglobin A1C  03/21/2021  . Colon Cancer Screening  01/21/2023  . Tetanus Vaccine  08/09/2025  . Flu Shot  Completed  . DEXA scan (bone density measurement)  Completed  . COVID-19 Vaccine  Completed  .  Hepatitis C: One time screening is recommended by Center for Disease Control  (CDC) for  adults born from [birth year] through 1967.   Completed  . Pneumonia vaccines  Completed     HOW TO SCHEDULE A MAMMOGRAM  [facility] Schedule an appointment by calling [phone].

## 2020-12-22 NOTE — Progress Notes (Signed)
MEDICARE ANNUAL WELLNESS VISIT AND FOLLOW UP  Assessment:   Encounter for Medicare annual wellness exam Due annually Schedule mammogram and DEXA  Atherosclerosis of aorta Per CT 07/2019 Control blood pressure, cholesterol, glucose, increase exercise.    Essential hypertension - continue medications, DASH diet, exercise and monitor at home. Call if greater than 130/80.  - CBC with Differential/Platelet - CMP/GFR - TSH  Asthma, unspecified asthma severity, uncomplicated controlled  Gastroesophageal reflux disease without esophagitis Continue PPI/H2 blocker, diet discussed  Other specified hypothyroidism Hypothyroidism-check TSH level, continue medications the same, reminded to take on an empty stomach 30-78mins before food.   Diabetes mellitus due to underlying condition with diabetic chronic kidney disease ([place]) Discussed general issues about diabetes pathophysiology and management., Educational material distributed., Suggested low cholesterol diet., Encouraged aerobic exercise., Discussed foot care., Reminded to get yearly retinal exam. - Hemoglobin A1c  CKD stage 2 associated with T2DM (HCC) Increase fluids, avoid NSAIDS, monitor sugars, will monitor - CMP WITH GFR  Hyperlipidemia associated with T2DM ([place])  -continue medications, check lipids, decrease fatty foods, increase activity.  - Lipid panel  Medication management - Magnesium  Arthritis RICE, NSAIDS PRN, ortho referral if needed  Diarrhea IBS, controlled   History of colonic polyps UTD colonoscopy  Depression, major, recurrent, in remission ([place]) In remission on medications; continue paxil   Lifestyle discussed: diet/exerise, sleep hygiene, stress management, hydration  Morbid obesity (HCC) - increase veggies, decrease carbs - long discussion about weight loss, diet, and exercise - phentermine d/c'd due to lack of benefit - patient admits to emotional eating; strategies discussed, get chocolate out  of the house, get husband on board, have plan for when cravings hit, try protein heavy snacks, information provided  Hypothyroidism continue medications the same pending lab results reminded to take on an empty stomach 30-64mins before food.  check TSH level  Liver disease/ fatty liver disease Check labs, avoid tylenol, alcohol, weight loss advised.    Obstructive sleep apnea ? No longer has per most recent sleep study; Dr. Brett Fairy following - Weight loss encouraged  Nocturnal hypoxia  On 2L O2 [place] via pulmonology, has follow up planned-   Osteopenia DEXA due - ordered to schedule with mammogram  continue Vit D and Ca, weight bearing exercises  Sinus headache - continue claritin, do saline nasal irrigations then flonase 1-2 sprays nightly  Follow up if not improving can try steroid taper   Orders Placed This Encounter  Procedures  . MM Digital Screening  . DG Bone Density  . CBC with Differential/Platelet  . COMPLETE METABOLIC PANEL WITH GFR  . Magnesium  . Lipid panel  . TSH  . Hemoglobin A1c    Over 30 minutes of exam, counseling, chart review, and critical decision making was performed  Future Appointments  Date Time Provider Magnet  06/22/2021  2:00 PM Liane Comber, NP [facility] None  07/28/2021 11:15 AM Suzzanne Cloud, NP [facility] None  11/24/2021  1:30 PM Valentina Shaggy, MD [facility] None  12/22/2021  3:30 PM Liane Comber, NP [facility] None     Plan:   During the course of the visit the patient was educated and counseled about appropriate screening and preventive services including:    Pneumococcal vaccine   Influenza vaccine  Td vaccine  Screening electrocardiogram  Screening mammography  Bone densitometry screening  Colorectal cancer screening  Diabetes screening  Glaucoma screening  Nutrition counseling   Advanced directives: given info/requested   Subjective:   Grace Barry is  a 70 y.o. female who presents  for Welcome to medicare visit and 3 month follow up on hypertension, T2 diabetes with CKD, hyperlipidemia, vitamin D def.   She has hx of sleep apnea, was on oral device for many years; she was having severe dreams and concern for REM disorder per Dr. Brett Fairy, home study was inconclusive, had follow sleep study 09/27/2020 showing significant hypoxemia, oxygen level was 90% with desaturations to about 84%, had unremarkable cardiology and pulm work up, recently started on 2L O2 by [place], has improved AM HA.   However notes persistent sinus headache despite claritin and saline sprays; not doing flonase -   She has GERD, taking omeprazole 20 mg PM and famotidine 40 mg PM and no longer having breakthrough.  She has hx of major depression, chronic widespread myalgias suspected for fibromyalgia, switched from paxil to cymbalta with benefit. She has history of migraines, she has been following with Dr. Jannifer Franklin for balance and mental fogging, changed some medications which helped.   BMI is Body mass index is 35.96 kg/m., she has not been working on diet and exercise. She eats 1-2 meals a day. She emotionally and bored eat. She drink 120 oz of water a day, with crystal lite very diluted. She has not been working out since pandemic and knee pain, plans to restart water aerobics once able.  Wt Readings from Last 3 Encounters:  12/22/20 203 lb (92.1 kg)  12/06/20 201 lb (91.2 kg)  11/23/20 198 lb 12.8 oz (90.2 kg)   She had CT coronary 07/2019 -CAD-RADS 1. Minimal non-obstructive CAD (0-24%) but did show aortic atherosclerosis She recently had benign ECHO in 12/08/2020 with EF of 60-65%  Her blood pressure has been controlled at home,  Metoprolol 50mg  XL for palpitations, losartan100 mg,BP: 126/78  She does not workout. She denies chest pain, shortness of breath, dizziness.   She is on cholesterol medication, crestor 10 mg daily and denies myalgias. Her cholesterol is at goal. The cholesterol was:   Lab Results   Component Value Date   CHOL 93 09/21/2020   HDL 35 (L) 09/21/2020   LDLCALC 32 09/21/2020   TRIG 182 (H) 09/21/2020   CHOLHDL 2.7 09/21/2020   She has been working on diet and exercise for Diabetes with CKD III controlled on metformin and ozempic 1 mg/week - SE with invokana, she is on bASA, she is on ACE/ARB, and denies paresthesia of the feet, polydipsia, polyuria and visual disturbances. She checks occasional fasting, around 100, does have supplies.  Last A1C was:  Lab Results  Component Value Date   HGBA1C 6.2 (H) 09/21/2020    She has CKD IIIa associated with T2DM monitored at this office:  Lab Results  Component Value Date   GFRNONAA >60 10/19/2020   Patient is on Vitamin D supplement, down from taking 4 days x 50000 IU to once a week.  Lab Results  Component Value Date   VD25OH 142 (H) 09/21/2020   She is on thyroid medication. Her medication was not changed last visit, taking 200 mcg dialy but admits recently has missed some dose Lab Results  Component Value Date   TSH 0.36 (L) 09/21/2020      Medication Review Current Outpatient Medications on File Prior to Visit  Medication Sig  . aspirin 81 MG tablet Take 81 mg by mouth daily.  . baclofen (LIORESAL) 10 MG tablet Take 1 tablet (10 mg total) by mouth 2 (two) times daily as needed for muscle spasms.  Marland Kitchen  CALCIUM PO Take 1,200 mg by mouth daily.  . Cyanocobalamin (VITAMIN B12 SL) Place 1 tablet under the tongue daily.  . DULoxetine (CYMBALTA) 60 MG capsule Take 1 capsule (60 mg total) by mouth daily.  Marland Kitchen EPINEPHrine 0.3 mg/0.3 mL IJ SOAJ injection Inject 0.3 mg into the muscle once.  . famotidine (PEPCID) 40 MG tablet Take 1 tab daily as needed for refllux  . glucose blood (ONE TOUCH ULTRA TEST) test strip Check Blood sugar Daily for diabetes  . Lancets (ONETOUCH ULTRASOFT) lancets Check Blood sugar  daily  . losartan (COZAAR) 100 MG tablet Take      1 tablet       Daily         for BP and Kidney protection  .  Magnesium 250 MG TABS Take 250 mg by mouth 2 (two) times daily.  . metFORMIN (GLUCOPHAGE-XR) 500 MG 24 hr tablet Take 2 tablets 2 x /day with a meal for Diabetes  . metoprolol succinate (TOPROL-XL) 50 MG 24 hr tablet Take 1 tablet (50 mg total) by mouth daily. Take with or immediately following a meal.  . Multiple Vitamins-Minerals (MULTIVITAMIN PO) Take 1 tablet by mouth daily.   . Omega-3 Fatty Acids (FISH OIL PO) Take by mouth daily.  Marland Kitchen omeprazole (PRILOSEC) 20 MG capsule Take 1 capsule (20 mg total) by mouth daily. 1 hour prior to bedtime.  Marland Kitchen PRESCRIPTION MEDICATION Inject as directed every 30 (thirty) days. Allergy Shot  . rosuvastatin (CRESTOR) 10 MG tablet TAKE 1 TABLET BY MOUTH EVERY DAY FOR CHOLESTEROL  . Semaglutide, 1 MG/DOSE, (OZEMPIC, 1 MG/DOSE,) 2 MG/1.5ML SOPN Inject 1 mg into the skin once a week.  Marland Kitchen SYNTHROID 200 MCG tablet TAKE 1 TABLET DAILY ON AN EMPTY STOMACH WITH ONLY WATER FOR 30 MINUTES & NO ANTACID MEDS, CALCIUM OR MAGNESIUM FOR 4 HOURS & AVOID BIOTIN  . Turmeric (QC TUMERIC COMPLEX PO) Take by mouth.  . Vitamin D, Ergocalciferol, (DRISDOL) 1.25 MG (50000 UNIT) CAPS capsule Take 1 capsule 1 x /week for Vit D Deficiency   No current facility-administered medications on file prior to visit.    Current Problems (verified) Patient Active Problem List   Diagnosis Date Noted  . Hypoxemia associated with sleep 10/02/2020  . Leukocytosis 09/27/2020  . Tremor 07/28/2020  . Uncontrolled REM sleep behavior disorder 07/20/2020  . Sleep paralysis, recurrent isolated 07/20/2020  . Bad dreams 07/20/2020  . Sleep related headaches 07/20/2020  . Fatigue due to depression 07/20/2020  . Psychophysiological insomnia 07/20/2020  . Aortic atherosclerosis ([place]) 12/12/2019  . Pain 01/17/2019  . Hyperlipidemia associated with type 2 diabetes mellitus ([place]) 11/07/2018  . Osteopenia 11/07/2018  . Seasonal and perennial allergic rhinitis 07/17/2018  . Vitamin D deficiency disease  05/19/2015  . Medication management 05/19/2015  . Migraine 02/19/2015  . Vertigo 02/19/2015  . History of colonic polyps 01/15/2015  . IBS (irritable bowel syndrome) 09/01/2014  . Morbid obesity ([place]) 07/01/2014  . CKD stage 2 due to type 2 diabetes mellitus ([place]) 07/01/2014  . Fatty liver   . Arthritis   . Hypothyroidism 12/15/2010  . T2_NIDDM w/CKD (GFR 56 ml/min) 12/15/2010  . Depression, major, recurrent, in remission ([place]) 12/15/2010  . Obstructive sleep apnea 12/15/2010  . Essential hypertension 12/15/2010  . GERD 12/15/2010    Screening Tests Immunization History  Administered Date(s) Administered  . Influenza, High Dose Seasonal PF 08/10/2015, 08/10/2016, 08/28/2017, 11/07/2018, 08/07/2019, 09/21/2020  . Influenza-Unspecified 08/05/2019  . PFIZER(Purple Top)SARS-COV-2 Vaccination 11/19/2019, 12/10/2019, 08/05/2020  .  Pneumococcal Conjugate-13 07/01/2014  . Pneumococcal Polysaccharide-23 10/09/2002, 05/26/2008, 12/13/2016  . Td 10/09/2002, 10/30/2010  . Tdap 08/10/2015  . Zoster 06/20/2011     Preventative care: Tetanus: 2016 Pneumovax: 2018 Prevnar 13: 2015 Flu vaccine: 2021 Zostavax: 2012  Pap: 2009 remote declines another MGM: 10/2018, due patient will schedule DEXA: 2020 osteopenia -1.5 CXR 2013 Colonoscopy: 12/2019 Dr. Dierdre Highman, 3 year recall  EGD: 10/2014 + gastritis MRI 11/20/2017 PFT 06/2018  CT coronary 07/2019 -CAD-RADS 1. Minimal non-obstructive CAD (0-24%).  aortic atherosclerosis  Names of Other Physician/Practitioners you currently use: 1. [facility]- here for primary care 2. Dr. Wynetta Emery, [place], eye doctor, last visit 2021 - report requested 3. Dr. Betsey Holiday, dentist, last visit q 6 months  Patient Care Team: Unk Pinto, MD as PCP - General (Internal Medicine) Jola Baptist, DC as Anesthesiologist (Chiropractic Medicine)   Allergies Allergies  Allergen Reactions  . Floxin [Ofloxacin] Hives   . Invokana [Canagliflozin]     Hematuria- avoid class  . Viberzi [Eluxadoline] Hives  . Doxycycline Rash  . Penicillins Rash    SURGICAL HISTORY She  has a past surgical history that includes Total knee arthroplasty; Cholecystectomy; Abdominal hysterectomy; Cesarean section; Tonsillectomy; Knee arthroscopy; Carpal tunnel release; Hernia repair (07/24/11); Joint replacement; Adenoidectomy; Sinoscopy; Cataract extraction, bilateral; and Colonoscopy. FAMILY HISTORY Her family history includes Breast cancer in her mother; Colon cancer in her son; Esophageal cancer (age of onset: 55) in her son; Hyperlipidemia in her father and sister; Hypertension in her father; Prostate cancer in her father; Stomach cancer in her son. SOCIAL HISTORY She  reports that she has never smoked. She has never used smokeless tobacco. She reports current alcohol use. She reports that she does not use drugs.  MEDICARE WELLNESS OBJECTIVES: Physical activity: Current Exercise Habits: The patient does not participate in regular exercise at present, Exercise limited by: None identified Cardiac risk factors: Cardiac Risk Factors include: advanced age (>32men, >54 women);dyslipidemia;hypertension;obesity (BMI >30kg/m2);sedentary lifestyle;diabetes mellitus Depression/mood screen:   Depression screen [facility] 2/9 12/22/2020  Decreased Interest 0  Down, Depressed, Hopeless 0  PHQ - 2 Score 0  Altered sleeping 1  Tired, decreased energy 2  Change in appetite 0  Feeling bad or failure about yourself  0  Trouble concentrating 0  Moving slowly or fidgety/restless 0  Suicidal thoughts 0  PHQ-9 Score 3  Difficult doing work/chores Not difficult at all  Some recent data might be hidden    ADLs:  In your present state of health, do you have any difficulty performing the following activities: 12/22/2020  Hearing? N  Vision? N  Difficulty concentrating or making decisions? N  Walking or climbing stairs? N  Dressing or bathing? N   Doing errands, shopping? N  Some recent data might be hidden     Cognitive Testing  Alert? Yes  Normal Appearance?Yes  Oriented to person? Yes  Place? Yes   Time? Yes  Recall of three objects?  Yes  Can perform simple calculations? Yes  Displays appropriate judgment?Yes  Can read the correct time from a watch face?Yes  EOL planning: Does Patient Have a Medical Advance Directive?: Yes Type of Advance Directive: [place] will Does patient want to make changes to medical advance directive?: No - Patient declined Copy of [place] in Chart?: No - copy requested  Review of Systems:  Review of Systems  Constitutional: Negative.   HENT: Negative for congestion, ear discharge, ear pain, hearing loss, nosebleeds, sore throat  and tinnitus.   Eyes: Negative.   Respiratory: Negative.  Negative for stridor.   Cardiovascular: Negative.   Gastrointestinal: Negative for abdominal pain, blood in stool, constipation, diarrhea, heartburn, melena, nausea and vomiting.  Genitourinary: Negative.   Musculoskeletal: Negative.  Negative for falls.  Skin: Negative.   Neurological: Positive for headaches. Negative for dizziness, tingling, tremors, sensory change, speech change, focal weakness, seizures and loss of consciousness.  Endo/Heme/Allergies: Positive for environmental allergies.  Psychiatric/Behavioral: Negative for depression, substance abuse and suicidal ideas. The patient is not nervous/anxious and does not have insomnia.      Objective:   Today's Vitals   12/22/20 1527  BP: 126/78  Pulse: 93  SpO2: 93%  Weight: 203 lb (92.1 kg)   General appearance: alert, no distress, WD/WN,  female HEENT: normocephalic, sclerae anicteric, bil ext canals clear, TMs intact without erythema or bulging, nares patent, no discharge or erythema, pharynx normal Oral cavity: MMM, no lesions Neck: supple, no lymphadenopathy, no thyromegaly, no masses Heart:  RRR, normal S1, S2, no murmurs Lungs: CTA bilaterally, no wheezes, rhonchi, or rales Abdomen: +bs, soft, non tender, non distended, no masses, no hepatomegaly, no splenomegaly Musculoskeletal: nontender, no swelling, no obvious deformity Extremities: no edema, no cyanosis, no clubbing Pulses: 2+ symmetric, upper and lower extremities, normal cap refill Neurological: alert, oriented x 3, CN2-12 intact, strength normal upper extremities and lower extremities, sensation normal throughout, DTRs 2+ throughout, no cerebellar signs, gait normal Psychiatric: normal affect, behavior normal, pleasant  Breast: defer Gyn: defer Rectal: defer  Medicare Attestation I have personally reviewed: The patient's medical and social history Their use of alcohol, tobacco or illicit drugs Their current medications and supplements The patient's functional ability including ADLs,fall risks, home safety risks, cognitive, and hearing and visual impairment Diet and physical activities Evidence for depression or mood disorders  The patient's weight, height, BMI, and visual acuity have been recorded in the chart.  I have made referrals, counseling, and provided education to the patient based on review of the above and I have provided the patient with a written personalized care plan for preventive services.     Izora Ribas, NP   12/22/2020

## 2020-12-22 NOTE — Telephone Encounter (Signed)
Call made to [employer], spoke with Anderson [place], confirmed patient DOB. Made aware we need the results of the patients ONO faxed to our office. Results to be faxed. Will route message to nurse to hold message and f/u once paper work has been received.

## 2020-12-23 LAB — MAGNESIUM: Magnesium: 1.7 mg/dL (ref 1.5–2.5)

## 2020-12-23 LAB — HEMOGLOBIN A1C
Hgb A1c MFr Bld: 6.1 % of total Hgb — ABNORMAL HIGH (ref <5.7)
Mean Plasma Glucose: 128 mg/dL
eAG (mmol/L): 7.1 mmol/L

## 2020-12-23 LAB — CBC WITH DIFFERENTIAL/PLATELET
Absolute Monocytes: 725 cells/uL (ref 200–950)
Basophils Absolute: 42 cells/uL (ref 0–200)
Basophils Relative: 0.4 %
Eosinophils Absolute: 326 cells/uL (ref 15–500)
Eosinophils Relative: 3.1 %
HCT: 42.6 % (ref 35.0–45.0)
Hemoglobin: 14.3 g/dL (ref 11.7–15.5)
Lymphs Abs: 3476 cells/uL (ref 850–3900)
MCH: 29.3 pg (ref 27.0–33.0)
MCHC: 33.6 g/dL (ref 32.0–36.0)
MCV: 87.3 fL (ref 80.0–100.0)
MPV: 11 fL (ref 7.5–12.5)
Monocytes Relative: 6.9 %
Neutro Abs: 5933 cells/uL (ref 1500–7800)
Neutrophils Relative %: 56.5 %
Platelets: 314 10*3/uL (ref 140–400)
RBC: 4.88 10*6/uL (ref 3.80–5.10)
RDW: 13 % (ref 11.0–15.0)
Total Lymphocyte: 33.1 %
WBC: 10.5 10*3/uL (ref 3.8–10.8)

## 2020-12-23 LAB — COMPLETE METABOLIC PANEL WITH GFR
AG Ratio: 1.7 (calc) (ref 1.0–2.5)
ALT: 27 U/L (ref 6–29)
AST: 23 U/L (ref 10–35)
Albumin: 4.1 g/dL (ref 3.6–5.1)
Alkaline phosphatase (APISO): 90 U/L (ref 37–153)
BUN: 13 mg/dL (ref 7–25)
CO2: 29 mmol/L (ref 20–32)
Calcium: 9.8 mg/dL (ref 8.6–10.4)
Chloride: 101 mmol/L (ref 98–110)
Creat: 0.74 mg/dL (ref 0.60–0.93)
GFR, Est African American: 94 mL/min/{1.73_m2} (ref >=60)
GFR, Est Non African American: 82 mL/min/{1.73_m2} (ref >=60)
Globulin: 2.4 g/dL (calc) (ref 1.9–3.7)
Glucose, Bld: 99 mg/dL (ref 65–99)
Potassium: 4.1 mmol/L (ref 3.5–5.3)
Sodium: 139 mmol/L (ref 135–146)
Total Bilirubin: 0.7 mg/dL (ref 0.2–1.2)
Total Protein: 6.5 g/dL (ref 6.1–8.1)

## 2020-12-23 LAB — TSH: TSH: 5.15 mIU/L — ABNORMAL HIGH (ref 0.40–4.50)

## 2020-12-23 LAB — LIPID PANEL
Cholesterol: 158 mg/dL (ref <200)
HDL: 41 mg/dL — ABNORMAL LOW (ref >=50)
LDL Cholesterol (Calc): 87 mg/dL (calc)
Non-HDL Cholesterol (Calc): 117 mg/dL (calc) (ref <130)
Total CHOL/HDL Ratio: 3.9 (calc) (ref <5.0)
Triglycerides: 206 mg/dL — ABNORMAL HIGH (ref <150)

## 2020-12-28 ENCOUNTER — Telehealth: Payer: Self-pay | Admitting: *Deleted

## 2020-12-28 ENCOUNTER — Ambulatory Visit (INDEPENDENT_AMBULATORY_CARE_PROVIDER_SITE_OTHER): Payer: Medicare HMO

## 2020-12-28 DIAGNOSIS — J309 Allergic rhinitis, unspecified: Secondary | ICD-10-CM

## 2020-12-28 NOTE — Telephone Encounter (Signed)
ONO results: ONO looks good.  O2 is adequate on 2L.  Continue 2L Q HS per Rexene Edison NP.  Called patient, advised of results/recommendations per Tammy.  She verbalized understanding.  Nothing further needed.

## 2021-01-09 DIAGNOSIS — R0902 Hypoxemia: Secondary | ICD-10-CM | POA: Diagnosis not present

## 2021-01-09 DIAGNOSIS — G4733 Obstructive sleep apnea (adult) (pediatric): Secondary | ICD-10-CM | POA: Diagnosis not present

## 2021-01-11 ENCOUNTER — Encounter: Payer: Self-pay | Admitting: *Deleted

## 2021-01-18 ENCOUNTER — Other Ambulatory Visit: Payer: Self-pay | Admitting: Adult Health

## 2021-01-20 NOTE — Telephone Encounter (Signed)
Heather, please advise if you have seen results of pt's ONO.

## 2021-01-20 NOTE — Telephone Encounter (Signed)
Have we gotten results

## 2021-01-31 ENCOUNTER — Ambulatory Visit (INDEPENDENT_AMBULATORY_CARE_PROVIDER_SITE_OTHER): Payer: Medicare HMO | Admitting: *Deleted

## 2021-01-31 DIAGNOSIS — J309 Allergic rhinitis, unspecified: Secondary | ICD-10-CM | POA: Diagnosis not present

## 2021-02-02 ENCOUNTER — Other Ambulatory Visit: Payer: Self-pay | Admitting: Adult Health Nurse Practitioner

## 2021-02-09 DIAGNOSIS — G4733 Obstructive sleep apnea (adult) (pediatric): Secondary | ICD-10-CM | POA: Diagnosis not present

## 2021-02-09 DIAGNOSIS — R0902 Hypoxemia: Secondary | ICD-10-CM | POA: Diagnosis not present

## 2021-02-09 NOTE — Telephone Encounter (Signed)
Grace Barry, please see pt email and advise any recs. POC for night time use is not covered. The pt would have to qualify for daytime.   A while back you ordered a portable oxygen concentrator for me for travel.  [employer] says my insurance won't cover it.  I am leaving on a 4week trip on May 2.  Am I in danger if I don't use oxygen at night, or do I need to bite the bullet and pay for one?  I really don't think they even tried to get approval from my insurance as I never received a denial.

## 2021-02-10 NOTE — Telephone Encounter (Signed)
Grace Barry, Please see patient's most recent message regarding going 4 weeks without a POC and advise.  Do we need to contact her DME company to arrange oxygen at her destination?

## 2021-02-10 NOTE — Telephone Encounter (Signed)
FYI:  This patient was called on 12/28/20, see note copied and pasted below:   Nolon Stalls, Kimber Relic, RN  Registered Nurse    Telephone Encounter  Signed  Encounter Date:  12/28/2020           Signed         Show:Clear all [x] Manual[] Template[] Copied  Added by: [x] Nolon Stalls, Kimber Relic, RN   [] Hover for details  ONO results: ONO looks good.  O2 is adequate on 2L.  Continue 2L Q HS per Rexene Edison NP.  Called patient, advised of results/recommendations per Tammy.  She verbalized understanding.  Nothing further needed.        Electronically signed by Vanessa Barbara, RN at 12/28/2020 10:13 AM

## 2021-02-10 NOTE — Telephone Encounter (Signed)
Try to call patient to discuss  The options as she can change her current concentrator over to a simply go device if her insurance and DME company will allow this or she can try to rent a concentrator while she is gone on her trip she can talk to her DME for pricing  It would be better for her to be on oxygen while she is away

## 2021-02-21 ENCOUNTER — Telehealth: Payer: Self-pay | Admitting: Pulmonary Disease

## 2021-02-21 NOTE — Telephone Encounter (Signed)
Spoke with the pt  We ordered POC with [employer]  She was unsuccessful about reaching anyone there by phone, so went to their store  She states she tried to pay out of pocket for POC bc she understands insurance will not pay since she only uses at night  She states that they told her she had to have to walk test done with them and they could not do this until mid May  She is leaving town this wkend and needs the POC before then  She also does not need o2 during the day, so unsure why walk is needed  [facility] for [place] and we should hold this for to f/u on bc pt very upset and needing an answer asap

## 2021-02-21 NOTE — Telephone Encounter (Signed)
Spoke with Melissa at [employer].   She is going to check on this POC for the pt and she will send the information over to her logistic manager so they can research and get back with the pt about exactly which POC that she can use for night time use and that would also be portable for her.  Melissa requested that the pt be given [phone] and she can call anytime to check on the status of the order.  I have LM on VM for the pt to call us back.

## 2021-02-21 NOTE — Telephone Encounter (Signed)
Called [employer] and spoke with [place]. She was able to locate the patient. Per [employer]'s records, they have tried calling her a total of 10 times. The first few times were to get updated insurance information. Once they received this and contacted her insurance, they found out she has a coinsurance amount of $521.75 that will need to be collected. They have tried calling her but she will not answer. They have also sent out letters to have her to contact them. Once they receive this payment, they will be able to provide the POC for her.   Will send information to patient in a Mychart message.   Nothing further needed.

## 2021-02-21 NOTE — Telephone Encounter (Signed)
Called and spoke with pt who stated she heard from [place] with [employer] who said they did not have the POC that she was wanting. Pt said that she is going to be purchasing the POC herself as she has already been made aware that insurance will not be covering it. Stated to pt that she could call [employer] directly as a lot of pts have purchased POCs from them. Pt said that she might also try to call [facility] as she has dealt with them before. Nothing further needed.

## 2021-02-22 ENCOUNTER — Telehealth: Payer: Self-pay | Admitting: Pulmonary Disease

## 2021-02-22 DIAGNOSIS — Z1231 Encounter for screening mammogram for malignant neoplasm of breast: Secondary | ICD-10-CM | POA: Diagnosis not present

## 2021-02-22 DIAGNOSIS — M85852 Other specified disorders of bone density and structure, left thigh: Secondary | ICD-10-CM | POA: Diagnosis not present

## 2021-02-22 DIAGNOSIS — M85851 Other specified disorders of bone density and structure, right thigh: Secondary | ICD-10-CM | POA: Diagnosis not present

## 2021-02-22 LAB — HM MAMMOGRAPHY

## 2021-02-22 LAB — HM DEXA SCAN

## 2021-02-22 NOTE — Telephone Encounter (Signed)
Checked with Vallarie Mare [facility] to see if she had received anything from [employer] on patient. She said yes. Filled out form based off of chart and Chan faxed it back to [employer]. Called and spoke with patient to let her know that we do have the form and that it has been filled out and faxed back to [employer]. Advised her to let us know if she needs anything else. Nothing further needed at this time.

## 2021-02-22 NOTE — Telephone Encounter (Signed)
lmtcb for pt.  I checked AO's look-at and did not see any [employer] forms on this pt.

## 2021-02-22 NOTE — Telephone Encounter (Signed)
Pt returning a phone call. Pt can be reached at [phone]

## 2021-02-24 ENCOUNTER — Encounter: Payer: Self-pay | Admitting: Internal Medicine

## 2021-03-01 ENCOUNTER — Telehealth: Payer: Self-pay | Admitting: *Deleted

## 2021-03-01 DIAGNOSIS — Z20822 Contact with and (suspected) exposure to covid-19: Secondary | ICD-10-CM | POA: Diagnosis not present

## 2021-03-01 NOTE — Telephone Encounter (Signed)
Left message ot inform the patient of Bone Density results and instructions to take Vitamin D, Calcium 500 to 600 mg daily and exercise.

## 2021-03-11 DIAGNOSIS — R0902 Hypoxemia: Secondary | ICD-10-CM | POA: Diagnosis not present

## 2021-03-11 DIAGNOSIS — G4733 Obstructive sleep apnea (adult) (pediatric): Secondary | ICD-10-CM | POA: Diagnosis not present

## 2021-04-11 DIAGNOSIS — G4733 Obstructive sleep apnea (adult) (pediatric): Secondary | ICD-10-CM | POA: Diagnosis not present

## 2021-04-11 DIAGNOSIS — R0902 Hypoxemia: Secondary | ICD-10-CM | POA: Diagnosis not present

## 2021-04-13 DIAGNOSIS — J3089 Other allergic rhinitis: Secondary | ICD-10-CM | POA: Diagnosis not present

## 2021-04-13 NOTE — Progress Notes (Signed)
EXP  04/13/22 

## 2021-04-14 DIAGNOSIS — J302 Other seasonal allergic rhinitis: Secondary | ICD-10-CM

## 2021-04-15 ENCOUNTER — Other Ambulatory Visit: Payer: Self-pay | Admitting: Internal Medicine

## 2021-04-15 ENCOUNTER — Other Ambulatory Visit: Payer: Self-pay | Admitting: Adult Health

## 2021-04-15 DIAGNOSIS — F3340 Major depressive disorder, recurrent, in remission, unspecified: Secondary | ICD-10-CM

## 2021-04-15 DIAGNOSIS — K219 Gastro-esophageal reflux disease without esophagitis: Secondary | ICD-10-CM

## 2021-04-15 MED ORDER — FAMOTIDINE 40 MG PO TABS: ORAL_TABLET | ORAL | 1 refills | Status: DC

## 2021-04-15 MED ORDER — DULOXETINE HCL 60 MG PO CPEP: ORAL_CAPSULE | ORAL | 1 refills | Status: DC

## 2021-04-26 ENCOUNTER — Encounter: Payer: Self-pay | Admitting: Neurology

## 2021-05-05 ENCOUNTER — Ambulatory Visit (INDEPENDENT_AMBULATORY_CARE_PROVIDER_SITE_OTHER): Payer: Medicare HMO | Admitting: *Deleted

## 2021-05-05 DIAGNOSIS — J309 Allergic rhinitis, unspecified: Secondary | ICD-10-CM

## 2021-05-11 DIAGNOSIS — G4733 Obstructive sleep apnea (adult) (pediatric): Secondary | ICD-10-CM | POA: Diagnosis not present

## 2021-05-11 DIAGNOSIS — R0902 Hypoxemia: Secondary | ICD-10-CM | POA: Diagnosis not present

## 2021-05-12 ENCOUNTER — Other Ambulatory Visit: Payer: Self-pay

## 2021-05-12 ENCOUNTER — Ambulatory Visit (INDEPENDENT_AMBULATORY_CARE_PROVIDER_SITE_OTHER): Payer: Medicare HMO | Admitting: Neurology

## 2021-05-12 ENCOUNTER — Encounter: Payer: Self-pay | Admitting: Neurology

## 2021-05-12 VITALS — BP 134/80 | HR 83 | Ht 63.0 in | Wt 202.0 lb

## 2021-05-12 DIAGNOSIS — G43009 Migraine without aura, not intractable, without status migrainosus: Secondary | ICD-10-CM

## 2021-05-12 MED ORDER — NORTRIPTYLINE HCL 10 MG PO CAPS: ORAL_CAPSULE | ORAL | 5 refills | Status: DC

## 2021-05-12 MED ORDER — TIZANIDINE HCL 2 MG PO TABS: 2.0000 mg | ORAL_TABLET | Freq: Two times a day (BID) | ORAL | 3 refills | Status: DC | PRN

## 2021-05-12 NOTE — Progress Notes (Signed)
I have read the note, and I agree with the clinical assessment and plan.  Addisen Chappelle K Diella Gillingham   

## 2021-05-12 NOTE — Progress Notes (Signed)
PATIENT: Grace Barry DOB: Jan 11, 1951  REASON FOR VISIT: follow up HISTORY FROM: patient Primary Neurologist: Dr. Jannifer Franklin   HISTORY OF PRESENT ILLNESS: Today 05/12/21  Grace Barry is a 70 year old female with history of migraine headache and tremor.  Had sleep study in November 2021 using dental device, recommended to come for an in lab study.  While using the dental device longer oxygen desaturation time, low oxygen may be because of fatigue and headaches. Now sleeping with 2 L oxygen, seeing pulmonary, doesn't sleep well, up and down all night. Reports 3 weeks ago, headaches returned, tension type headache, like wearing a cap. Typical migraines in all over head, migraine features. Right now feels like tight ball camp. No sinus drainage or issues. 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 No neuro symptoms. Still search for words at times. MRI of the brain was normal in Jan 2019. Taking baclofen as needed, helps pain but doesn't alleviate, makes drowsy. Still on Toprol-XL.   Update 07/28/2020 SS: Grace Barry is a 70 year old female with history of migraine headache and reported tremor.  She has come off Topamax, on her own, feels memory and cognition is clearer.  Headaches remain well controlled, on metoprolol. For headache, will take baclofen with good benefit.  Tremor in the hands, left greater than the right, intermittent, worse with anxiety, overall stable, doesn't interfere with daily activity.  Recently saw Dr. Brett Fairy for sleep consultation, pending sleep study, due to fatigue.  She wears a dental device.  Is overall doing well.  Presents today for follow-up unaccompanied.  HISTORY 07/28/2019 SS: Grace Barry is a 70 year old female with history of migraine headache and reported tremor.  She remains on Topamax 50 mg daily, and metoprolol XR 50 mg daily.  She indicates this provides excellent control of her migraine headaches.  She says she is  having less than 1 headache a month.  If she gets a headache, it may be moderate intensity, but taking her baclofen relieves the headache.  She indicates that her memory decline seems to have improved.  After last visit, she tried to decrease her dose of Topamax due to report of memory decline, but her headaches increased. She is currently undergoing evaluation by cardiology for EKG changes.  She is planning to have a cardiac stress test tomorrow.  She indicates she has been suffering from significant fatigue.  She does report tremor in her hands, left is worse than the right.  This does not impact her daily activity or handwriting.  She notices it worse during times of anxiety or stress.  She says she has had a few stumbles, but no falls. She presents today for follow-up via virtual visit.    REVIEW OF SYSTEMS: Out of a complete 14 system review of symptoms, the patient complains only of the following symptoms, and all other reviewed systems are negative.  Headache   ALLERGIES: Allergies  Allergen Reactions   Floxin [Ofloxacin] Hives   Invokana [Canagliflozin]     Hematuria- avoid class   Viberzi [Eluxadoline] Hives   Doxycycline Rash   Penicillins Rash    HOME MEDICATIONS: Outpatient Medications Prior to Visit  Medication Sig Dispense Refill   aspirin 81 MG tablet Take 81 mg by mouth daily.     CALCIUM PO Take 1,200 mg by mouth daily.     Cyanocobalamin (VITAMIN B12 SL) Place 1 tablet under the tongue daily.  DULoxetine (CYMBALTA) 60 MG capsule Take 1 capsule  Daily for chronic Pain Syndrome8 90 capsule 1   EPINEPHrine 0.3 mg/0.3 mL IJ SOAJ injection Inject 0.3 mg into the muscle once.     famotidine (PEPCID) 40 MG tablet Take 1 tablet  2 x / day (every 12 hours) to prevent Heartburn & Indigestion 180 tablet 1   fluticasone (FLONASE) 50 MCG/ACT nasal spray PLACE 1-2 SPRAYS INTO BOTH NOSTRILS AT BEDTIME. 48 mL 3   glucose blood (ONE TOUCH ULTRA TEST) test strip Check Blood sugar Daily  for diabetes 100 each 3   Lancets (ONETOUCH ULTRASOFT) lancets Check Blood sugar  daily 100 each 3   losartan (COZAAR) 100 MG tablet Take      1 tablet       Daily         for BP and Kidney protection 90 tablet 3   Magnesium 250 MG TABS Take 250 mg by mouth 2 (two) times daily.     metFORMIN (GLUCOPHAGE-XR) 500 MG 24 hr tablet Take  2 tablets 2 x /day with Meals for Diabetes 360 tablet 3   metoprolol succinate (TOPROL-XL) 50 MG 24 hr tablet Take 1 tablet (50 mg total) by mouth daily. Take with or immediately following a meal. 90 tablet 4   Multiple Vitamins-Minerals (MULTIVITAMIN PO) Take 1 tablet by mouth daily.      Omega-3 Fatty Acids (FISH OIL PO) Take by mouth daily.     omeprazole (PRILOSEC) 20 MG capsule Take 1 capsule daily to prevent Heartburn & Acid Reflux 90 capsule 1   PRESCRIPTION MEDICATION Inject as directed every 30 (thirty) days. Allergy Shot     rosuvastatin (CRESTOR) 10 MG tablet TAKE 1 TABLET BY MOUTH EVERY DAY FOR CHOLESTEROL 90 tablet 1   Semaglutide, 1 MG/DOSE, (OZEMPIC, 1 MG/DOSE,) 2 MG/1.5ML SOPN Inject 1 mg into the skin once a week. 9 mL 1   SYNTHROID 200 MCG tablet TAKE 1 TABLET DAILY ON AN EMPTY STOMACH WITH ONLY WATER FOR 30 MINUTES & NO ANTACID MEDS, CALCIUM OR MAGNESIUM FOR 4 HOURS & AVOID BIOTIN 90 tablet 3   Turmeric (QC TUMERIC COMPLEX PO) Take by mouth.     Vitamin D, Ergocalciferol, (DRISDOL) 1.25 MG (50000 UNIT) CAPS capsule Take 1 capsule 1 x /week for Vit D Deficiency 12 capsule 1   baclofen (LIORESAL) 10 MG tablet Take 1 tablet (10 mg total) by mouth 2 (two) times daily as needed for muscle spasms. 30 each 5   No facility-administered medications prior to visit.    PAST MEDICAL HISTORY: Past Medical History:  Diagnosis Date   Allergy    Anxiety    Arthritis    Asthma    Cataract    Chronic kidney disease (CKD), stage II (mild) 07/01/2014   Overview:  Overview:  Due to DM, last GFR 64    Degenerative tear of triangular fibrocartilage complex (TFCC)  of left wrist 01/17/2019   Depression    Fibromyalgia    GERD (gastroesophageal reflux disease)    Hyperlipidemia    Hypertension    Hypoxemia    IBS (irritable bowel syndrome)    Liver disease    Migraine 02/19/2015   Migraines    Obesity    Osteoporosis    Sensorineural hearing loss (SNHL) of left ear with unrestricted hearing of right ear 12/31/2017   Sleep apnea    uses oral device   Sleep difficulties    Thyroid disease    hypothyroidism  Tuberculosis    positive PPD test, chest x ray negative   Type II or unspecified type diabetes mellitus without mention of complication, not stated as uncontrolled    Urticaria    Ventral hernia    Vertigo 02/19/2015    PAST SURGICAL HISTORY: Past Surgical History:  Procedure Laterality Date   ABDOMINAL HYSTERECTOMY     ADENOIDECTOMY     CARPAL TUNNEL RELEASE     bilateral   CATARACT EXTRACTION, BILATERAL     CESAREAN SECTION     x2   CHOLECYSTECTOMY     COLONOSCOPY     HERNIA REPAIR  07/24/11   ventral hernia   JOINT REPLACEMENT     bilateral knees   KNEE ARTHROSCOPY     bilateral   SINOSCOPY     TONSILLECTOMY     TOTAL KNEE ARTHROPLASTY     bilateral    FAMILY HISTORY: Family History  Problem Relation Age of Onset   Breast cancer Mother    Hypertension Father    Hyperlipidemia Father    Prostate cancer Father    Hyperlipidemia Sister    Esophageal cancer Son 69       Died 2013-11-25   Colon cancer Son    Stomach cancer Son    Colon polyps Neg Hx    Diabetes Neg Hx    Kidney disease Neg Hx    Migraines Neg Hx    Rectal cancer Neg Hx     SOCIAL HISTORY: Social History   Socioeconomic History   Marital status: Married    Spouse name: Francee Piccolo   Number of children: 2   Years of education: masters   Highest education level: Not on file  Occupational History   Occupation: Retired Pharmacist, hospital  Tobacco Use   Smoking status: Never   Smokeless tobacco: Never  Vaping Use   Vaping Use: Never used  Substance and  Sexual Activity   Alcohol use: Yes    Alcohol/week: 0.0 standard drinks    Comment: occasional   Drug use: No   Sexual activity: Not on file  Other Topics Concern   Not on file  Social History Narrative   Lives with husband, Francee Piccolo   Patient is right handed.   Patient drinks 64 oz of diet soda daily.   Social Determinants of Health   Financial Resource Strain: Not on file  Food Insecurity: Not on file  Transportation Needs: Not on file  Physical Activity: Not on file  Stress: Not on file  Social Connections: Not on file  Intimate Partner Violence: Not on file   PHYSICAL EXAM  Vitals:   05/12/21 1051  BP: 134/80  Pulse: 83  Weight: 202 lb (91.6 kg)  Height: 5\' 3"  (1.6 m)   Body mass index is 35.78 kg/m.  Generalized: Well developed, in no acute distress Neurological examination  Mentation: Alert oriented to time, place, history taking. Follows all commands speech and language fluent Cranial nerve II-XII: Pupils were equal round reactive to light. Extraocular movements were full, visual field were full on confrontational test. Facial sensation and strength were normal.  Head turning and shoulder shrug were normal and symmetric.   Motor: The motor testing reveals 5 over 5 strength of all 4 extremities. Good symmetric motor tone is noted throughout.  Sensory: Sensory testing is intact to soft touch on all 4 extremities. No evidence of extinction is noted.  Coordination: Cerebellar testing reveals good finger-nose-finger and heel-to-shin bilaterally.  No tremor appreciated with finger-nose-finger. Gait  and station: Gait is normal. Tandem gait is unsteady.  Reflexes: Deep tendon reflexes are symmetric and normal bilaterally.   DIAGNOSTIC DATA (LABS, IMAGING, TESTING) - I reviewed patient records, labs, notes, testing and imaging myself where available.  Lab Results  Component Value Date   WBC 10.5 12/22/2020   HGB 14.3 12/22/2020   HCT 42.6 12/22/2020   MCV 87.3  12/22/2020   PLT 314 12/22/2020      Component Value Date/Time   NA 139 12/22/2020 1625   NA 140 07/02/2019 1214   K 4.1 12/22/2020 1625   CL 101 12/22/2020 1625   CO2 29 12/22/2020 1625   GLUCOSE 99 12/22/2020 1625   BUN 13 12/22/2020 1625   BUN 11 07/02/2019 1214   CREATININE 0.74 12/22/2020 1625   CALCIUM 9.8 12/22/2020 1625   PROT 6.5 12/22/2020 1625   ALBUMIN 4.1 10/19/2020 1356   AST 23 12/22/2020 1625   ALT 27 12/22/2020 1625   ALKPHOS 73 10/19/2020 1356   BILITOT 0.7 12/22/2020 1625   GFRNONAA 82 12/22/2020 1625   GFRAA 94 12/22/2020 1625   Lab Results  Component Value Date   CHOL 158 12/22/2020   HDL 41 (L) 12/22/2020   LDLCALC 87 12/22/2020   TRIG 206 (H) 12/22/2020   CHOLHDL 3.9 12/22/2020   Lab Results  Component Value Date   HGBA1C 6.1 (H) 12/22/2020   Lab Results  Component Value Date   VITAMINB12 287 06/14/2020   Lab Results  Component Value Date   TSH 5.15 (H) 12/22/2020   ASSESSMENT AND PLAN 70 y.o. year old female  has a past medical history of Allergy, Anxiety, Arthritis, Asthma, Cataract, Chronic kidney disease (CKD), stage II (mild) (07/01/2014), Degenerative tear of triangular fibrocartilage complex (TFCC) of left wrist (01/17/2019), Depression, Fibromyalgia, GERD (gastroesophageal reflux disease), Hyperlipidemia, Hypertension, Hypoxemia, IBS (irritable bowel syndrome), Liver disease, Migraine (02/19/2015), Migraines, Obesity, Osteoporosis, Sensorineural hearing loss (SNHL) of left ear with unrestricted hearing of right ear (12/31/2017), Sleep apnea, Sleep difficulties, Thyroid disease, Tuberculosis, Type II or unspecified type diabetes mellitus without mention of complication, not stated as uncontrolled, Urticaria, Ventral hernia, and Vertigo (02/19/2015). here with:  1.  Migraine headache 2.  Tremor -Return of headache over the last 3 weeks, seems consistent with tension type headache, under a lot of stress recently, weather change  -Will try  low-dose nortriptyline 10 mg at bedtime -Stop baclofen, switch to tizanidine 2 mg as needed twice daily -For migraines, was previously on Topamax, was able to wean down, had side effect, will not restart -Encouraged her to send MyChart update next week -Has appointment to see Dr. Jannifer Franklin in September, if she is doing well, she will cancel this appointment, we will go ahead and schedule 18-month follow-up with me today  Butler Denmark, AGNP-C, DNP 05/12/2021, 11:24 AM [facility] [address], [place], [place] [address] [phone]

## 2021-05-12 NOTE — Patient Instructions (Signed)
Add in nortriptyline 10 mg at bedtime for headache prevention  Try tizanidine 2 mg twice daily as needed for headache, stop the baclofen  Can increase nortriptyline is needed Send my chart message next week with update  See you back in 6 months

## 2021-05-13 ENCOUNTER — Other Ambulatory Visit: Payer: Self-pay | Admitting: Adult Health

## 2021-05-13 DIAGNOSIS — E1122 Type 2 diabetes mellitus with diabetic chronic kidney disease: Secondary | ICD-10-CM

## 2021-05-18 ENCOUNTER — Other Ambulatory Visit: Payer: Self-pay | Admitting: *Deleted

## 2021-05-18 ENCOUNTER — Ambulatory Visit (INDEPENDENT_AMBULATORY_CARE_PROVIDER_SITE_OTHER): Payer: Medicare HMO

## 2021-05-18 DIAGNOSIS — J309 Allergic rhinitis, unspecified: Secondary | ICD-10-CM | POA: Diagnosis not present

## 2021-05-18 NOTE — Patient Outreach (Signed)
[facility]) Care Management  05/18/2021  Grace Barry January 26, 1951 [id number]   RN Health Coach received return outreach screening call  from patient. RN described the services that [facility] has to offer. The RN, Magazine features editor. Per patient she declined the services but has agreed to 6 month mailing of educational material.  Plan: RN will sent Living Well with diabetes RN will send educational mailing every 6 months  [place] Management [phone]

## 2021-05-18 NOTE — Patient Outreach (Signed)
[facility]) Care Management  05/18/2021  Grace Barry [birth year] [id number]   RN Health Coach attempted outreach screening call to patient.  Patient was unavailable. HIPPA compliance voicemail message left with return callback number.  Plan: RN will call back within 10 business days  [place] Management [phone]

## 2021-05-19 ENCOUNTER — Encounter: Payer: Self-pay | Admitting: Neurology

## 2021-05-20 ENCOUNTER — Other Ambulatory Visit: Payer: Self-pay | Admitting: Neurology

## 2021-05-23 DIAGNOSIS — M25551 Pain in right hip: Secondary | ICD-10-CM | POA: Diagnosis not present

## 2021-05-23 DIAGNOSIS — M25561 Pain in right knee: Secondary | ICD-10-CM | POA: Diagnosis not present

## 2021-05-23 DIAGNOSIS — M25552 Pain in left hip: Secondary | ICD-10-CM | POA: Diagnosis not present

## 2021-05-23 DIAGNOSIS — M25562 Pain in left knee: Secondary | ICD-10-CM | POA: Diagnosis not present

## 2021-05-26 DIAGNOSIS — M6281 Muscle weakness (generalized): Secondary | ICD-10-CM | POA: Diagnosis not present

## 2021-05-26 DIAGNOSIS — S76811D Strain of other specified muscles, fascia and tendons at thigh level, right thigh, subsequent encounter: Secondary | ICD-10-CM | POA: Diagnosis not present

## 2021-05-26 DIAGNOSIS — S76812D Strain of other specified muscles, fascia and tendons at thigh level, left thigh, subsequent encounter: Secondary | ICD-10-CM | POA: Diagnosis not present

## 2021-05-26 DIAGNOSIS — M25561 Pain in right knee: Secondary | ICD-10-CM | POA: Diagnosis not present

## 2021-05-30 DIAGNOSIS — M6281 Muscle weakness (generalized): Secondary | ICD-10-CM | POA: Diagnosis not present

## 2021-05-30 DIAGNOSIS — M25561 Pain in right knee: Secondary | ICD-10-CM | POA: Diagnosis not present

## 2021-05-30 DIAGNOSIS — M25562 Pain in left knee: Secondary | ICD-10-CM | POA: Diagnosis not present

## 2021-05-30 DIAGNOSIS — S76811D Strain of other specified muscles, fascia and tendons at thigh level, right thigh, subsequent encounter: Secondary | ICD-10-CM | POA: Diagnosis not present

## 2021-06-01 ENCOUNTER — Ambulatory Visit (INDEPENDENT_AMBULATORY_CARE_PROVIDER_SITE_OTHER): Payer: Medicare HMO | Admitting: *Deleted

## 2021-06-01 DIAGNOSIS — M25561 Pain in right knee: Secondary | ICD-10-CM | POA: Diagnosis not present

## 2021-06-01 DIAGNOSIS — M6281 Muscle weakness (generalized): Secondary | ICD-10-CM | POA: Diagnosis not present

## 2021-06-01 DIAGNOSIS — M7052 Other bursitis of knee, left knee: Secondary | ICD-10-CM | POA: Diagnosis not present

## 2021-06-01 DIAGNOSIS — J309 Allergic rhinitis, unspecified: Secondary | ICD-10-CM

## 2021-06-01 DIAGNOSIS — M7051 Other bursitis of knee, right knee: Secondary | ICD-10-CM | POA: Diagnosis not present

## 2021-06-04 ENCOUNTER — Other Ambulatory Visit: Payer: Self-pay | Admitting: Neurology

## 2021-06-11 DIAGNOSIS — R0902 Hypoxemia: Secondary | ICD-10-CM | POA: Diagnosis not present

## 2021-06-11 DIAGNOSIS — G4733 Obstructive sleep apnea (adult) (pediatric): Secondary | ICD-10-CM | POA: Diagnosis not present

## 2021-06-13 DIAGNOSIS — S76812D Strain of other specified muscles, fascia and tendons at thigh level, left thigh, subsequent encounter: Secondary | ICD-10-CM | POA: Diagnosis not present

## 2021-06-13 DIAGNOSIS — M25562 Pain in left knee: Secondary | ICD-10-CM | POA: Diagnosis not present

## 2021-06-13 DIAGNOSIS — S76811D Strain of other specified muscles, fascia and tendons at thigh level, right thigh, subsequent encounter: Secondary | ICD-10-CM | POA: Diagnosis not present

## 2021-06-13 DIAGNOSIS — M6281 Muscle weakness (generalized): Secondary | ICD-10-CM | POA: Diagnosis not present

## 2021-06-22 ENCOUNTER — Encounter: Payer: Medicare HMO | Admitting: Adult Health

## 2021-06-22 NOTE — Progress Notes (Signed)
CPE AND FOLLOW UP  Assessment:   Encounter for general adult medical examination with abnormal findings 1 year  Type 2 diabetes mellitus with other diabetic kidney complication, without long-term current use of insulin (HCC) -     Hemoglobin A1c Discussed general issues about diabetes pathophysiology and management., Educational material distributed., Suggested low cholesterol diet., Encouraged aerobic exercise., Discussed foot care., Reminded to get yearly retinal exam.  CKD stage 2 due to type 2 diabetes mellitus ([place]) -     Hemoglobin A1c - U/A , microalbumin/creatinine ratio  Increase fluids, avoid NSAIDS, monitor sugars, will monitor  Hyperlipidemia associated with type 2 diabetes mellitus (HCC) -     Lipid panel check lipids decrease fatty foods increase activity.   Depression, major, recurrent, in remission ([place]) -     DULoxetine (CYMBALTA) 30 MG capsule; Take 1 capsule (30 mg total) by mouth daily. Contributing to fatigue- has been on cymbalta in the past and did well- will try to stop paxil and switch to cymbalta.    Fatigue, unspecified type Neuro study showed no sleep apnea, does use nasal cannula O2 at night due to hypoxia Check labs Treat depression Close folllow up -     EKG 12-Lead       -     DULoxetine (CYMBALTA) 30 MG capsule; Take 1 capsule (30 mg total) by mouth daily.  Atherosclerosis of aorta Per CT 07/2019 Control blood pressure, cholesterol, glucose, increase exercise.  AAA   Essential hypertension - continue medications, DASH diet, exercise and monitor at home. Call if greater than 130/80.  - CBC with Differential/Platelet - CMP/GFR - TSH   Obstructive sleep apnea Last sleep study showed no longer has sleep apnea but uses O2 2L nasal cannula  Asthma, unspecified asthma severity, uncomplicated controlled  Gastroesophageal reflux disease without esophagitis Continue PPI/H2 blocker, diet discussed Still has symptoms more related to  stress  Other specified hypothyroidism Hypothyroidism-check TSH level, continue medications the same, reminded to take on an empty stomach 30-7mns before food.   Medication management - Magnesium  Arthritis RICE, NSAIDS PRN, ortho referral if needed  Diarrhea IBS, controlled   History of colonic polyps Colonoscopy due 2024  Morbid obesity ([place] - increase veggies, decrease carbs - long discussion about weight loss, diet, and exercise - patient admits to emotional eating; strategies discussed, get chocolate out of the house, get husband on board, have plan for when cravings hit, try protein heavy snacks, information provided -Continue Ozempic '1mg'$  q week  Hypothyroidism continue medications the same pending lab results reminded to take on an empty stomach 30-668ms before food.  check TSH level  Liver disease/ fatty liver disease Check labs, avoid tylenol, alcohol, weight loss advised.   Screening for Ischemic heart disease EKG  Over 30 minutes of exam, counseling, chart review, and critical decision making was performed  Future Appointments  Date Time Provider DeTwilight9/29/2022 11:00 AM WiKathrynn DuckingMD [facility] None  11/15/2021  9:00 AM Pleasant, FrEppie GibsonRN [facility] None  11/17/2021  2:45 PM SlSuzzanne CloudNP [facility] None  11/24/2021  1:30 PM GaValentina ShaggyMD [facility] None  12/22/2021  3:30 PM CoLiane ComberNP [facility] None  06/26/2022 10:00 AM MuMagda BernheimNP [facility] None      Subjective:   [name] is a [age].o. female who presents for CPE and 3 month follow up on hypertension, T2 diabetes with CKD, hyperlipidemia, vitamin D def.   She and her husband  travel a lot, planning on trip in October to [place] , [place] and [place], [place].  She has followed with Dr. Toy Cookey . She states she does not feel like she has good sleep. She will still wake up tired. Sleep study december no apnea, using 2L O2 through nasal cannula for  dropping O2 with sleep Lab Results  Component Value Date   IRON 64 06/14/2020   TIBC 443 06/14/2020   FERRITIN 22 06/14/2020   Lab Results  Component Value Date   VITAMINB12 287 06/14/2020    She has history of migraines, she has been following with Dr. Jannifer Franklin. She is using Noritriptyline 10 mg QHS. Zanaflex when she gets a tension headache- not helping as much as Baclofen.  She is on Cymbalta for body aches and depression.   BMI is Body mass index is 35.96 kg/m., she has not been working on diet and exercise. She eats 1-2 meals a day. She emotionally and bored eat. She drink 120 oz of water a day, with crystal lite very diluted. She has not been working out since pandemic and knee pain, plans to restart water aerobics once able.  BMI is Body mass index is 35.96 kg/m., she has been working on diet and exercise. Wt Readings from Last 3 Encounters:  06/24/21 203 lb (92.1 kg)  05/12/21 202 lb (91.6 kg)  12/22/20 203 lb (92.1 kg)    she is prescribed phentermine and topamax for weight loss.  While on the medication they have lost 0 lbs since last visit. They deny palpitations, anxiety, trouble sleeping, elevated BP.  Wt Readings from Last 3 Encounters:  06/24/21 203 lb (92.1 kg)  05/12/21 202 lb (91.6 kg)  12/22/20 203 lb (92.1 kg)   She had CT coronary 07/2019 -CAD-RADS 1. Minimal non-obstructive CAD (0-24%) but did show aortic atherosclerosis Her blood pressure has been controlled at home,  Metoprolol '50mg'$  XL for palpitations, losartan  100 mg,BP: 112/72  BP Readings from Last 3 Encounters:  06/24/21 112/72  05/12/21 134/80  12/22/20 126/78    She does workout. She denies chest pain, shortness of breath, dizziness.   She is on cholesterol medication, crestor '10mg'$  daily and denies myalgias. Her cholesterol is at goal. The cholesterol was:   Lab Results  Component Value Date   CHOL 158 12/22/2020   HDL 41 (L) 12/22/2020   LDLCALC 87 12/22/2020   TRIG 206 (H) 12/22/2020    CHOLHDL 3.9 12/22/2020   She has been working on diet and exercise for Diabetes  with CKD III on ACE/ARB With hyperlipidemia  Metformin '500mg'$  2 tabs BID ozempic 1.0 mg once a week- now tolerating well she is on bASA Denies paresthesia of the feet, polydipsia, polyuria and visual disturbances. D[facility] [place] [facility]- scheduled Sept 2021 Not checking sugars.  Last A1C was:  Lab Results  Component Value Date   HGBA1C 6.1 (H) 12/22/2020    She has CKD IIIa associated with T2DM monitored at this office:  Lab Results  Component Value Date   [facility] 82 12/22/2020   Patient is currently on Vitamin D supplement 50000 units once a week and was a little elevated at recent check:  Lab Results  Component Value Date   VD25OH 142 (H) 09/21/2020   She is on thyroid medication, 220mg 1 tab daily, she is not on biotin. Normal thyroid [place]. Her medication was not changed last visit,.  Lab Results  Component Value Date   TSH 5.15 (H) 12/22/2020  Medication Review  Current Outpatient Medications (Endocrine & Metabolic):    metFORMIN (GLUCOPHAGE-XR) 500 MG 24 hr tablet, Take  2 tablets 2 x /day with Meals for Diabetes   OZEMPIC, 1 MG/DOSE, 4 MG/3ML SOPN, INJECT 1 MG INTO THE SKIN ONCE WEEKLY   SYNTHROID 200 MCG tablet, TAKE 1 TABLET DAILY ON AN EMPTY STOMACH WITH ONLY WATER FOR 30 MINUTES & NO ANTACID MEDS, CALCIUM OR MAGNESIUM FOR 4 HOURS & AVOID BIOTIN  Current Outpatient Medications (Cardiovascular):    EPINEPHrine 0.3 mg/0.3 mL IJ SOAJ injection, Inject 0.3 mg into the muscle once.   losartan (COZAAR) 100 MG tablet, Take      1 tablet       Daily         for BP and Kidney protection   metoprolol succinate (TOPROL-XL) 50 MG 24 hr tablet, Take 1 tablet (50 mg total) by mouth daily. Take with or immediately following a meal.   rosuvastatin (CRESTOR) 10 MG tablet, TAKE 1 TABLET BY MOUTH EVERY DAY FOR CHOLESTEROL  Current Outpatient Medications (Respiratory):    fluticasone (FLONASE) 50  MCG/ACT nasal spray, PLACE 1-2 SPRAYS INTO BOTH NOSTRILS AT BEDTIME.  Current Outpatient Medications (Analgesics):    aspirin 81 MG tablet, Take 81 mg by mouth daily.  Current Outpatient Medications (Hematological):    Cyanocobalamin (VITAMIN B12 SL), Place 1 tablet under the tongue daily.  Current Outpatient Medications (Other):    CALCIUM PO, Take 1,200 mg by mouth daily.   DULoxetine (CYMBALTA) 60 MG capsule, Take 1 capsule  Daily for chronic Pain Syndrome8   famotidine (PEPCID) 40 MG tablet, Take 1 tablet  2 x / day (every 12 hours) to prevent Heartburn & Indigestion   glucose blood (ONE TOUCH ULTRA TEST) test strip, Check Blood sugar Daily for diabetes   Lancets (ONETOUCH ULTRASOFT) lancets, Check Blood sugar  daily   Magnesium 250 MG TABS, Take 250 mg by mouth 2 (two) times daily.   Multiple Vitamins-Minerals (MULTIVITAMIN PO), Take 1 tablet by mouth daily.    nortriptyline (PAMELOR) 10 MG capsule, TAKE 1 CAPSULE BY MOUTH EVERYDAY AT BEDTIME   Omega-3 Fatty Acids (FISH OIL PO), Take by mouth daily.   omeprazole (PRILOSEC) 20 MG capsule, Take 1 capsule daily to prevent Heartburn & Acid Reflux   PRESCRIPTION MEDICATION, Inject as directed every 30 (thirty) days. Allergy Shot   tiZANidine (ZANAFLEX) 2 MG tablet, Take 1 tablet (2 mg total) by mouth 2 (two) times daily as needed for muscle spasms.   Turmeric (QC TUMERIC COMPLEX PO), Take by mouth.   Vitamin D, Ergocalciferol, (DRISDOL) 1.25 MG (50000 UNIT) CAPS capsule, Take 1 capsule 1 x /week for Vit D Deficiency  Current Problems (verified) Patient Active Problem List   Diagnosis Date Noted   Hypoxemia associated with sleep 10/02/2020   Leukocytosis 09/27/2020   Tremor 07/28/2020   Uncontrolled REM sleep behavior disorder 07/20/2020   Sleep paralysis, recurrent isolated 07/20/2020   Bad dreams 07/20/2020   Sleep related headaches 07/20/2020   Fatigue due to depression 07/20/2020   Psychophysiological insomnia 07/20/2020    Aortic atherosclerosis ([place]) 12/12/2019   Pain 01/17/2019   Hyperlipidemia associated with type 2 diabetes mellitus ([place]) 11/07/2018   Osteopenia 11/07/2018   Seasonal and perennial allergic rhinitis 07/17/2018   Vitamin D deficiency disease 05/19/2015   Medication management 05/19/2015   Migraine 02/19/2015   Vertigo 02/19/2015   History of colonic polyps 01/15/2015   IBS (irritable bowel syndrome) 09/01/2014   Morbid obesity ([place])  07/01/2014   CKD stage 2 due to type 2 diabetes mellitus ([place]) 07/01/2014   Fatty liver    Arthritis    Hypothyroidism 12/15/2010   T2_NIDDM w/CKD (GFR 56 ml/min) 12/15/2010   Depression, major, recurrent, in remission ([place]) 12/15/2010   Obstructive sleep apnea 12/15/2010   Essential hypertension 12/15/2010   GERD 12/15/2010    Screening Tests Immunization History  Administered Date(s) Administered   Influenza, High Dose Seasonal PF 08/10/2015, 08/10/2016, 08/28/2017, 11/07/2018, 08/07/2019, 09/21/2020   Influenza-Unspecified 08/05/2019   PFIZER(Purple Top)SARS-COV-2 Vaccination 11/19/2019, 12/10/2019, 08/05/2020   Pneumococcal Conjugate-13 07/01/2014   Pneumococcal Polysaccharide-23 10/09/2002, 05/26/2008, 12/13/2016   Td 10/09/2002, 10/30/2010   Tdap 08/10/2015   Zoster, Live 06/20/2011    Preventative care: Tetanus: 2016 Pneumovax: 2018 Prevnar 13: 2015 Flu vaccine: 07/2019 Zostavax: 2012  Pap: 2009 remote declines another MGM: 02/22/21- normal repeat 1 year DEXA:4 /26/22- osteopenia CXR 2020 Colonoscopy: 12/2019 Dr. Peggye Ley return 3 years EGD: 10/2014 + gastritis MRI 11/20/2017 PFT 06/2018  CT coronary 07/2019 -CAD-RADS 1. Minimal non-obstructive CAD (0-24%).  aortic atherosclerosis  Names of Other Physician/Practitioners you currently use: 1. [facility]- here for primary care 2. Dr. Wynetta Emery, [place], eye doctor, last visit 2022, patient has insurance again, plans to schedule  3. Dr. Betsey Holiday,  dentist, last visit q 6 months 4. Dr. Domingo Cocking, neuro  5. Dr. Toy Cookey, sleep apnea  Patient Care Team: Unk Pinto, MD as PCP - General (Internal Medicine) Jola Baptist, DC as Anesthesiologist (Chiropractic Medicine) Pleasant, Eppie Gibson, RN as [place] Management   Allergies Allergies  Allergen Reactions   Floxin [Ofloxacin] Hives   Invokana [Canagliflozin]     Hematuria- avoid class   Viberzi [Eluxadoline] Hives   Doxycycline Rash   Penicillins Rash    SURGICAL HISTORY She  has a past surgical history that includes Total knee arthroplasty; Cholecystectomy; Abdominal hysterectomy; Cesarean section; Tonsillectomy; Knee arthroscopy; Carpal tunnel release; Hernia repair (07/24/11); Joint replacement; Adenoidectomy; Sinoscopy; Cataract extraction, bilateral; and Colonoscopy. FAMILY HISTORY Her family history includes Breast cancer in her mother; Colon cancer in her son; Esophageal cancer (age of onset: 30) in her son; Hyperlipidemia in her father and sister; Hypertension in her father; Prostate cancer in her father; Stomach cancer in her son. SOCIAL HISTORY She  reports that she has never smoked. She has never used smokeless tobacco. She reports current alcohol use. She reports that she does not use drugs.  Review of Systems:  Review of Systems  Constitutional: Negative.  Negative for chills and fever.  HENT:  Positive for sinus pain and tinnitus. Negative for congestion, ear discharge, ear pain, hearing loss, nosebleeds and sore throat.   Eyes: Negative.  Negative for blurred vision and double vision.  Respiratory: Negative.  Negative for cough, hemoptysis, sputum production, shortness of breath, wheezing and stridor.   Cardiovascular: Negative.  Negative for chest pain, palpitations and leg swelling.  Gastrointestinal:  Positive for diarrhea (irregular) and heartburn. Negative for abdominal pain, blood in stool, constipation, melena, nausea and vomiting.   Genitourinary: Negative.  Negative for dysuria and urgency.  Musculoskeletal:  Positive for joint pain. Negative for back pain, falls, myalgias and neck pain.  Skin: Negative.  Negative for rash.  Neurological:  Positive for headaches. Negative for dizziness, tingling, tremors, sensory change, speech change, focal weakness, seizures, loss of consciousness and weakness.  Endo/Heme/Allergies: Negative.  Does not bruise/bleed easily.  Psychiatric/Behavioral:  Positive for depression. Negative for substance abuse and suicidal ideas. The patient has  insomnia. The patient is not nervous/anxious.     Objective:   Today's Vitals   06/24/21 0958  BP: 112/72  Pulse: 89  Temp: (!) 97.2 F (36.2 C)  SpO2: 94%  Weight: 203 lb (92.1 kg)   General appearance: alert, no distress, WD/WN,  female HEENT: normocephalic, sclerae anicteric, TMs normal, no erythema, burlging.  nares patent, no discharge or erythema, pharynx normal Oral cavity: MMM, no lesions Neck: supple, no lymphadenopathy, no thyromegaly, + left sided small thyroid nodules, no masses Heart: RRR, normal S1, S2, no murmurs Lungs: CTA bilaterally, no wheezes, rhonchi, or rales Abdomen: +bs, soft, non tender, non distended, no masses, no hepatomegaly, no splenomegaly Musculoskeletal: nontender, no swelling, no obvious deformity Extremities: no edema, no cyanosis, no clubbing Pulses: 2+ symmetric, upper and lower extremities, normal cap refill Neurological: alert, oriented x 3, CN2-12 intact, strength normal upper extremities and lower extremities, sensation normal throughout, DTRs 2+ throughout, no cerebellar signs, gait normal Psychiatric: normal affect, behavior normal, pleasant  [facility]  06/24/2021

## 2021-06-23 DIAGNOSIS — M25562 Pain in left knee: Secondary | ICD-10-CM | POA: Diagnosis not present

## 2021-06-23 DIAGNOSIS — M6281 Muscle weakness (generalized): Secondary | ICD-10-CM | POA: Diagnosis not present

## 2021-06-23 DIAGNOSIS — M25561 Pain in right knee: Secondary | ICD-10-CM | POA: Diagnosis not present

## 2021-06-23 DIAGNOSIS — M7051 Other bursitis of knee, right knee: Secondary | ICD-10-CM | POA: Diagnosis not present

## 2021-06-24 ENCOUNTER — Ambulatory Visit (INDEPENDENT_AMBULATORY_CARE_PROVIDER_SITE_OTHER): Payer: Medicare HMO | Admitting: Nurse Practitioner

## 2021-06-24 ENCOUNTER — Encounter: Payer: Self-pay | Admitting: Nurse Practitioner

## 2021-06-24 ENCOUNTER — Other Ambulatory Visit: Payer: Self-pay

## 2021-06-24 VITALS — BP 112/72 | HR 89 | Temp 97.2°F | Wt 203.0 lb

## 2021-06-24 DIAGNOSIS — N182 Chronic kidney disease, stage 2 (mild): Secondary | ICD-10-CM | POA: Diagnosis not present

## 2021-06-24 DIAGNOSIS — E559 Vitamin D deficiency, unspecified: Secondary | ICD-10-CM

## 2021-06-24 DIAGNOSIS — E1122 Type 2 diabetes mellitus with diabetic chronic kidney disease: Secondary | ICD-10-CM | POA: Diagnosis not present

## 2021-06-24 DIAGNOSIS — Z0001 Encounter for general adult medical examination with abnormal findings: Secondary | ICD-10-CM

## 2021-06-24 DIAGNOSIS — F3340 Major depressive disorder, recurrent, in remission, unspecified: Secondary | ICD-10-CM

## 2021-06-24 DIAGNOSIS — E039 Hypothyroidism, unspecified: Secondary | ICD-10-CM

## 2021-06-24 DIAGNOSIS — I700 Atherosclerosis of aorta: Secondary | ICD-10-CM | POA: Diagnosis not present

## 2021-06-24 DIAGNOSIS — I10 Essential (primary) hypertension: Secondary | ICD-10-CM

## 2021-06-24 DIAGNOSIS — G4733 Obstructive sleep apnea (adult) (pediatric): Secondary | ICD-10-CM

## 2021-06-24 DIAGNOSIS — Z0000 Encounter for general adult medical examination without abnormal findings: Secondary | ICD-10-CM

## 2021-06-24 DIAGNOSIS — E1169 Type 2 diabetes mellitus with other specified complication: Secondary | ICD-10-CM

## 2021-06-24 DIAGNOSIS — E785 Hyperlipidemia, unspecified: Secondary | ICD-10-CM | POA: Diagnosis not present

## 2021-06-24 DIAGNOSIS — K219 Gastro-esophageal reflux disease without esophagitis: Secondary | ICD-10-CM | POA: Diagnosis not present

## 2021-06-24 DIAGNOSIS — Z79899 Other long term (current) drug therapy: Secondary | ICD-10-CM | POA: Diagnosis not present

## 2021-06-24 DIAGNOSIS — E1129 Type 2 diabetes mellitus with other diabetic kidney complication: Secondary | ICD-10-CM

## 2021-06-24 DIAGNOSIS — K760 Fatty (change of) liver, not elsewhere classified: Secondary | ICD-10-CM

## 2021-06-24 DIAGNOSIS — Z1389 Encounter for screening for other disorder: Secondary | ICD-10-CM

## 2021-06-24 DIAGNOSIS — Z136 Encounter for screening for cardiovascular disorders: Secondary | ICD-10-CM

## 2021-06-24 DIAGNOSIS — M1990 Unspecified osteoarthritis, unspecified site: Secondary | ICD-10-CM

## 2021-06-24 NOTE — Patient Instructions (Signed)

## 2021-06-25 ENCOUNTER — Other Ambulatory Visit: Payer: Self-pay | Admitting: Nurse Practitioner

## 2021-06-25 LAB — URINALYSIS, ROUTINE W REFLEX MICROSCOPIC
Bacteria, UA: NONE SEEN /HPF
Bilirubin Urine: NEGATIVE
Glucose, UA: NEGATIVE
Hgb urine dipstick: NEGATIVE
Hyaline Cast: NONE SEEN /LPF
Ketones, ur: NEGATIVE
Nitrite: NEGATIVE
Protein, ur: NEGATIVE
Specific Gravity, Urine: 1.022 (ref 1.001–1.035)
pH: 5.5 (ref 5.0–8.0)

## 2021-06-25 LAB — HEMOGLOBIN A1C
Hgb A1c MFr Bld: 6.2 % of total Hgb — ABNORMAL HIGH (ref <5.7)
Mean Plasma Glucose: 131 mg/dL
eAG (mmol/L): 7.3 mmol/L

## 2021-06-25 LAB — VITAMIN D 25 HYDROXY (VIT D DEFICIENCY, FRACTURES): Vit D, 25-Hydroxy: 85 ng/mL (ref 30–100)

## 2021-06-25 LAB — CBC WITH DIFFERENTIAL/PLATELET
Absolute Monocytes: 655 cells/uL (ref 200–950)
Basophils Absolute: 56 cells/uL (ref 0–200)
Basophils Relative: 0.5 %
Eosinophils Absolute: 244 cells/uL (ref 15–500)
Eosinophils Relative: 2.2 %
HCT: 44.3 % (ref 35.0–45.0)
Hemoglobin: 14.5 g/dL (ref 11.7–15.5)
Lymphs Abs: 3119 cells/uL (ref 850–3900)
MCH: 28.9 pg (ref 27.0–33.0)
MCHC: 32.7 g/dL (ref 32.0–36.0)
MCV: 88.4 fL (ref 80.0–100.0)
MPV: 10.7 fL (ref 7.5–12.5)
Monocytes Relative: 5.9 %
Neutro Abs: 7026 cells/uL (ref 1500–7800)
Neutrophils Relative %: 63.3 %
Platelets: 285 10*3/uL (ref 140–400)
RBC: 5.01 10*6/uL (ref 3.80–5.10)
RDW: 13.3 % (ref 11.0–15.0)
Total Lymphocyte: 28.1 %
WBC: 11.1 10*3/uL — ABNORMAL HIGH (ref 3.8–10.8)

## 2021-06-25 LAB — COMPLETE METABOLIC PANEL WITH GFR
AG Ratio: 2 (calc) (ref 1.0–2.5)
ALT: 25 U/L (ref 6–29)
AST: 21 U/L (ref 10–35)
Albumin: 4.5 g/dL (ref 3.6–5.1)
Alkaline phosphatase (APISO): 89 U/L (ref 37–153)
BUN: 12 mg/dL (ref 7–25)
CO2: 29 mmol/L (ref 20–32)
Calcium: 10.2 mg/dL (ref 8.6–10.4)
Chloride: 99 mmol/L (ref 98–110)
Creat: 0.92 mg/dL (ref 0.60–1.00)
Globulin: 2.3 g/dL (calc) (ref 1.9–3.7)
Glucose, Bld: 93 mg/dL (ref 65–99)
Potassium: 3.9 mmol/L (ref 3.5–5.3)
Sodium: 137 mmol/L (ref 135–146)
Total Bilirubin: 0.7 mg/dL (ref 0.2–1.2)
Total Protein: 6.8 g/dL (ref 6.1–8.1)
eGFR: 67 mL/min/{1.73_m2} (ref >=60)

## 2021-06-25 LAB — MICROALBUMIN / CREATININE URINE RATIO
Creatinine, Urine: 185 mg/dL (ref 20–275)
Microalb Creat Ratio: 6 mcg/mg creat (ref <30)
Microalb, Ur: 1.1 mg/dL

## 2021-06-25 LAB — LIPID PANEL
Cholesterol: 118 mg/dL (ref <200)
HDL: 39 mg/dL — ABNORMAL LOW (ref >=50)
LDL Cholesterol (Calc): 55 mg/dL (calc)
Non-HDL Cholesterol (Calc): 79 mg/dL (calc) (ref <130)
Total CHOL/HDL Ratio: 3 (calc) (ref <5.0)
Triglycerides: 161 mg/dL — ABNORMAL HIGH (ref <150)

## 2021-06-25 LAB — MICROSCOPIC MESSAGE

## 2021-06-25 LAB — TSH: TSH: 7.79 mIU/L — ABNORMAL HIGH (ref 0.40–4.50)

## 2021-06-25 LAB — MAGNESIUM: Magnesium: 1.6 mg/dL (ref 1.5–2.5)

## 2021-06-25 MED ORDER — SYNTHROID 200 MCG PO TABS: ORAL_TABLET | ORAL | 0 refills | Status: DC

## 2021-06-27 DIAGNOSIS — M25562 Pain in left knee: Secondary | ICD-10-CM | POA: Diagnosis not present

## 2021-06-27 DIAGNOSIS — S76811D Strain of other specified muscles, fascia and tendons at thigh level, right thigh, subsequent encounter: Secondary | ICD-10-CM | POA: Diagnosis not present

## 2021-06-27 DIAGNOSIS — M25561 Pain in right knee: Secondary | ICD-10-CM | POA: Diagnosis not present

## 2021-06-27 DIAGNOSIS — M6281 Muscle weakness (generalized): Secondary | ICD-10-CM | POA: Diagnosis not present

## 2021-06-29 ENCOUNTER — Other Ambulatory Visit: Payer: Self-pay | Admitting: Neurology

## 2021-06-30 DIAGNOSIS — M25562 Pain in left knee: Secondary | ICD-10-CM | POA: Diagnosis not present

## 2021-06-30 DIAGNOSIS — M6281 Muscle weakness (generalized): Secondary | ICD-10-CM | POA: Diagnosis not present

## 2021-06-30 DIAGNOSIS — M25561 Pain in right knee: Secondary | ICD-10-CM | POA: Diagnosis not present

## 2021-06-30 DIAGNOSIS — S76811D Strain of other specified muscles, fascia and tendons at thigh level, right thigh, subsequent encounter: Secondary | ICD-10-CM | POA: Diagnosis not present

## 2021-07-06 ENCOUNTER — Ambulatory Visit (INDEPENDENT_AMBULATORY_CARE_PROVIDER_SITE_OTHER): Payer: Medicare HMO

## 2021-07-06 DIAGNOSIS — S76812D Strain of other specified muscles, fascia and tendons at thigh level, left thigh, subsequent encounter: Secondary | ICD-10-CM | POA: Diagnosis not present

## 2021-07-06 DIAGNOSIS — M25562 Pain in left knee: Secondary | ICD-10-CM | POA: Diagnosis not present

## 2021-07-06 DIAGNOSIS — S76811D Strain of other specified muscles, fascia and tendons at thigh level, right thigh, subsequent encounter: Secondary | ICD-10-CM | POA: Diagnosis not present

## 2021-07-06 DIAGNOSIS — J309 Allergic rhinitis, unspecified: Secondary | ICD-10-CM

## 2021-07-06 DIAGNOSIS — M6281 Muscle weakness (generalized): Secondary | ICD-10-CM | POA: Diagnosis not present

## 2021-07-11 DIAGNOSIS — M25562 Pain in left knee: Secondary | ICD-10-CM | POA: Diagnosis not present

## 2021-07-12 DIAGNOSIS — R0902 Hypoxemia: Secondary | ICD-10-CM | POA: Diagnosis not present

## 2021-07-12 DIAGNOSIS — G4733 Obstructive sleep apnea (adult) (pediatric): Secondary | ICD-10-CM | POA: Diagnosis not present

## 2021-07-13 DIAGNOSIS — M25561 Pain in right knee: Secondary | ICD-10-CM | POA: Diagnosis not present

## 2021-07-13 DIAGNOSIS — M25562 Pain in left knee: Secondary | ICD-10-CM | POA: Diagnosis not present

## 2021-07-13 DIAGNOSIS — M7051 Other bursitis of knee, right knee: Secondary | ICD-10-CM | POA: Diagnosis not present

## 2021-07-13 DIAGNOSIS — M6281 Muscle weakness (generalized): Secondary | ICD-10-CM | POA: Diagnosis not present

## 2021-07-19 ENCOUNTER — Other Ambulatory Visit: Payer: Self-pay | Admitting: Adult Health

## 2021-07-19 MED ORDER — METFORMIN HCL ER 500 MG PO TB24: ORAL_TABLET | ORAL | 3 refills | Status: DC

## 2021-07-20 DIAGNOSIS — M25562 Pain in left knee: Secondary | ICD-10-CM | POA: Diagnosis not present

## 2021-07-20 DIAGNOSIS — M7052 Other bursitis of knee, left knee: Secondary | ICD-10-CM | POA: Diagnosis not present

## 2021-07-20 DIAGNOSIS — M6281 Muscle weakness (generalized): Secondary | ICD-10-CM | POA: Diagnosis not present

## 2021-07-20 DIAGNOSIS — M7051 Other bursitis of knee, right knee: Secondary | ICD-10-CM | POA: Diagnosis not present

## 2021-07-26 DIAGNOSIS — M7052 Other bursitis of knee, left knee: Secondary | ICD-10-CM | POA: Diagnosis not present

## 2021-07-26 DIAGNOSIS — M25562 Pain in left knee: Secondary | ICD-10-CM | POA: Diagnosis not present

## 2021-07-26 DIAGNOSIS — M7051 Other bursitis of knee, right knee: Secondary | ICD-10-CM | POA: Diagnosis not present

## 2021-07-26 DIAGNOSIS — M6281 Muscle weakness (generalized): Secondary | ICD-10-CM | POA: Diagnosis not present

## 2021-07-28 ENCOUNTER — Ambulatory Visit: Payer: Medicare HMO | Admitting: Neurology

## 2021-07-28 DIAGNOSIS — M7052 Other bursitis of knee, left knee: Secondary | ICD-10-CM | POA: Diagnosis not present

## 2021-07-28 DIAGNOSIS — M25562 Pain in left knee: Secondary | ICD-10-CM | POA: Diagnosis not present

## 2021-07-28 DIAGNOSIS — M7051 Other bursitis of knee, right knee: Secondary | ICD-10-CM | POA: Diagnosis not present

## 2021-07-28 DIAGNOSIS — M6281 Muscle weakness (generalized): Secondary | ICD-10-CM | POA: Diagnosis not present

## 2021-08-01 ENCOUNTER — Other Ambulatory Visit: Payer: Self-pay | Admitting: Neurology

## 2021-08-01 DIAGNOSIS — M25562 Pain in left knee: Secondary | ICD-10-CM | POA: Diagnosis not present

## 2021-08-01 DIAGNOSIS — S76812D Strain of other specified muscles, fascia and tendons at thigh level, left thigh, subsequent encounter: Secondary | ICD-10-CM | POA: Diagnosis not present

## 2021-08-01 DIAGNOSIS — M25561 Pain in right knee: Secondary | ICD-10-CM | POA: Diagnosis not present

## 2021-08-01 DIAGNOSIS — S76811D Strain of other specified muscles, fascia and tendons at thigh level, right thigh, subsequent encounter: Secondary | ICD-10-CM | POA: Diagnosis not present

## 2021-08-03 DIAGNOSIS — M6281 Muscle weakness (generalized): Secondary | ICD-10-CM | POA: Diagnosis not present

## 2021-08-03 DIAGNOSIS — M7051 Other bursitis of knee, right knee: Secondary | ICD-10-CM | POA: Diagnosis not present

## 2021-08-03 DIAGNOSIS — M7052 Other bursitis of knee, left knee: Secondary | ICD-10-CM | POA: Diagnosis not present

## 2021-08-03 DIAGNOSIS — M25562 Pain in left knee: Secondary | ICD-10-CM | POA: Diagnosis not present

## 2021-08-08 DIAGNOSIS — M25562 Pain in left knee: Secondary | ICD-10-CM | POA: Diagnosis not present

## 2021-08-09 DIAGNOSIS — M7051 Other bursitis of knee, right knee: Secondary | ICD-10-CM | POA: Diagnosis not present

## 2021-08-09 DIAGNOSIS — M25562 Pain in left knee: Secondary | ICD-10-CM | POA: Diagnosis not present

## 2021-08-09 DIAGNOSIS — M7052 Other bursitis of knee, left knee: Secondary | ICD-10-CM | POA: Diagnosis not present

## 2021-08-09 DIAGNOSIS — M6281 Muscle weakness (generalized): Secondary | ICD-10-CM | POA: Diagnosis not present

## 2021-08-11 ENCOUNTER — Ambulatory Visit (INDEPENDENT_AMBULATORY_CARE_PROVIDER_SITE_OTHER): Payer: Medicare HMO | Admitting: *Deleted

## 2021-08-11 DIAGNOSIS — J309 Allergic rhinitis, unspecified: Secondary | ICD-10-CM | POA: Diagnosis not present

## 2021-08-11 DIAGNOSIS — M6281 Muscle weakness (generalized): Secondary | ICD-10-CM | POA: Diagnosis not present

## 2021-08-11 DIAGNOSIS — S76812D Strain of other specified muscles, fascia and tendons at thigh level, left thigh, subsequent encounter: Secondary | ICD-10-CM | POA: Diagnosis not present

## 2021-08-11 DIAGNOSIS — G4733 Obstructive sleep apnea (adult) (pediatric): Secondary | ICD-10-CM | POA: Diagnosis not present

## 2021-08-11 DIAGNOSIS — R0902 Hypoxemia: Secondary | ICD-10-CM | POA: Diagnosis not present

## 2021-08-11 DIAGNOSIS — S76811D Strain of other specified muscles, fascia and tendons at thigh level, right thigh, subsequent encounter: Secondary | ICD-10-CM | POA: Diagnosis not present

## 2021-08-15 ENCOUNTER — Other Ambulatory Visit: Payer: Self-pay | Admitting: Neurology

## 2021-08-29 ENCOUNTER — Other Ambulatory Visit: Payer: Self-pay | Admitting: Adult Health

## 2021-08-29 MED ORDER — LOPERAMIDE HCL 2 MG PO CAPS: 2.0000 mg | ORAL_CAPSULE | ORAL | 0 refills | Status: AC | PRN

## 2021-08-29 MED ORDER — ONDANSETRON HCL 8 MG PO TABS: ORAL_TABLET | ORAL | 0 refills | Status: DC

## 2021-08-29 MED ORDER — AZITHROMYCIN 250 MG PO TABS: ORAL_TABLET | ORAL | 1 refills | Status: AC

## 2021-09-11 DIAGNOSIS — G4733 Obstructive sleep apnea (adult) (pediatric): Secondary | ICD-10-CM | POA: Diagnosis not present

## 2021-09-11 DIAGNOSIS — R0902 Hypoxemia: Secondary | ICD-10-CM | POA: Diagnosis not present

## 2021-09-23 ENCOUNTER — Other Ambulatory Visit: Payer: Self-pay | Admitting: Nurse Practitioner

## 2021-10-04 ENCOUNTER — Other Ambulatory Visit: Payer: Self-pay | Admitting: Internal Medicine

## 2021-10-04 ENCOUNTER — Other Ambulatory Visit: Payer: Self-pay | Admitting: Neurology

## 2021-10-04 DIAGNOSIS — K219 Gastro-esophageal reflux disease without esophagitis: Secondary | ICD-10-CM

## 2021-10-11 DIAGNOSIS — G4733 Obstructive sleep apnea (adult) (pediatric): Secondary | ICD-10-CM | POA: Diagnosis not present

## 2021-10-11 DIAGNOSIS — R0902 Hypoxemia: Secondary | ICD-10-CM | POA: Diagnosis not present

## 2021-10-12 ENCOUNTER — Ambulatory Visit (INDEPENDENT_AMBULATORY_CARE_PROVIDER_SITE_OTHER): Payer: Medicare HMO

## 2021-10-12 DIAGNOSIS — J309 Allergic rhinitis, unspecified: Secondary | ICD-10-CM

## 2021-10-18 ENCOUNTER — Ambulatory Visit (INDEPENDENT_AMBULATORY_CARE_PROVIDER_SITE_OTHER): Payer: Medicare HMO | Admitting: *Deleted

## 2021-10-18 DIAGNOSIS — J309 Allergic rhinitis, unspecified: Secondary | ICD-10-CM | POA: Diagnosis not present

## 2021-11-07 DIAGNOSIS — J3089 Other allergic rhinitis: Secondary | ICD-10-CM | POA: Diagnosis not present

## 2021-11-07 NOTE — Progress Notes (Signed)
VIALS MADE. EXP 11-07-22 °

## 2021-11-08 DIAGNOSIS — J302 Other seasonal allergic rhinitis: Secondary | ICD-10-CM | POA: Diagnosis not present

## 2021-11-11 DIAGNOSIS — G4733 Obstructive sleep apnea (adult) (pediatric): Secondary | ICD-10-CM | POA: Diagnosis not present

## 2021-11-11 DIAGNOSIS — R0902 Hypoxemia: Secondary | ICD-10-CM | POA: Diagnosis not present

## 2021-11-15 ENCOUNTER — Other Ambulatory Visit: Payer: Self-pay | Admitting: *Deleted

## 2021-11-15 NOTE — Patient Outreach (Signed)
[place] [facility]) Care Management  11/15/2021  Celsa Nordahl Schlossberg Sep 22, 1951 [id number]    [place] sent six month educational mailing on diabetes. Patient had aggred to educational mailing. Patient declined [facility] services of Education officer, museum, Patent attorney telephone assessments.  Plan: RN sent education material on Planning Healthy Meals RN will follow up with next six month mailing in  July 2023  [place] Management [phone]

## 2021-11-17 ENCOUNTER — Other Ambulatory Visit: Payer: Self-pay

## 2021-11-17 ENCOUNTER — Ambulatory Visit (INDEPENDENT_AMBULATORY_CARE_PROVIDER_SITE_OTHER): Payer: Medicare HMO

## 2021-11-17 ENCOUNTER — Ambulatory Visit (INDEPENDENT_AMBULATORY_CARE_PROVIDER_SITE_OTHER): Payer: Medicare HMO | Admitting: Neurology

## 2021-11-17 VITALS — BP 147/84 | HR 84 | Ht 63.0 in | Wt 203.0 lb

## 2021-11-17 DIAGNOSIS — G43009 Migraine without aura, not intractable, without status migrainosus: Secondary | ICD-10-CM

## 2021-11-17 DIAGNOSIS — J309 Allergic rhinitis, unspecified: Secondary | ICD-10-CM

## 2021-11-17 DIAGNOSIS — R251 Tremor, unspecified: Secondary | ICD-10-CM

## 2021-11-17 MED ORDER — BACLOFEN 10 MG PO TABS: 10.0000 mg | ORAL_TABLET | Freq: Two times a day (BID) | ORAL | 3 refills | Status: DC | PRN

## 2021-11-17 MED ORDER — NORTRIPTYLINE HCL 10 MG PO CAPS: ORAL_CAPSULE | ORAL | 3 refills | Status: DC

## 2021-11-17 MED ORDER — METOPROLOL SUCCINATE ER 50 MG PO TB24: 50.0000 mg | ORAL_TABLET | Freq: Every day | ORAL | 3 refills | Status: DC

## 2021-11-17 NOTE — Progress Notes (Signed)
PATIENT: Grace Barry DOB: 26-Sep-1951  REASON FOR VISIT: follow up for migraines, tremor HISTORY FROM: patient Primary Neurologist: Dr. Brett Fairy   HISTORY OF PRESENT ILLNESS: Today 11/17/21  Grace Barry is here today for follow-up with history of migraine headache and tremor. Since July headaches are much better, only 4. Doesn't think tizanidine works as well as baclofen to get rid of headaches. Still on nortriptyline 10 mg at bedtime, no side effects, working well. Stress is some better, her friend is now home. Tremor in hands only when super stressed. Still on oxygen at night, pulmonary. Just got back from [place], going to [place] in October.   05/12/2021 SS: Grace Barry is a 71 year old female with history of migraine headache and tremor.  Had sleep study in November 2021 using dental device, recommended to come for an in lab study.  While using the dental device longer oxygen desaturation time, low oxygen may be because of fatigue and headaches. Now sleeping with 2 L oxygen, seeing pulmonary, doesn't sleep well, up and down all night. Reports 3 weeks ago, headaches returned, tension type headache, like wearing a cap. Typical migraines in all over head, migraine features. Right now feels like tight ball camp. No sinus drainage or issues. 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 No neuro symptoms. Still search for words at times. MRI of the brain was normal in Jan 2019. Taking baclofen as needed, helps pain but doesn't alleviate, makes drowsy. Still on Toprol-XL.   Update 07/28/2020 SS: Grace Barry is a 71 year old female with history of migraine headache and reported tremor.  She has come off Topamax, on her own, feels memory and cognition is clearer.  Headaches remain well controlled, on metoprolol. For headache, will take baclofen with good benefit.  Tremor in the hands, left greater than the right, intermittent, worse with anxiety, overall stable,  doesn't interfere with daily activity.  Recently saw Dr. Brett Fairy for sleep consultation, pending sleep study, due to fatigue.  She wears a dental device.  Is overall doing well.  Presents today for follow-up unaccompanied.  HISTORY 07/28/2019 SS: Grace Barry is a 71 year old female with history of migraine headache and reported tremor.  She remains on Topamax 50 mg daily, and metoprolol XR 50 mg daily.  She indicates this provides excellent control of her migraine headaches.  She says she is having less than 1 headache a month.  If she gets a headache, it may be moderate intensity, but taking her baclofen relieves the headache.  She indicates that her memory decline seems to have improved.  After last visit, she tried to decrease her dose of Topamax due to report of memory decline, but her headaches increased. She is currently undergoing evaluation by cardiology for EKG changes.  She is planning to have a cardiac stress test tomorrow.  She indicates she has been suffering from significant fatigue.  She does report tremor in her hands, left is worse than the right.  This does not impact her daily activity or handwriting.  She notices it worse during times of anxiety or stress.  She says she has had a few stumbles, but no falls. She presents today for follow-up via virtual visit.    REVIEW OF SYSTEMS: Out of a complete 14 system review of symptoms, the patient complains only of the following symptoms, and all other reviewed systems are negative.  Headache   ALLERGIES: Allergies  Allergen Reactions  Floxin [Ofloxacin] Hives   Invokana [Canagliflozin]     Hematuria- avoid class   Viberzi [Eluxadoline] Hives   Doxycycline Rash   Penicillins Rash    HOME MEDICATIONS: Outpatient Medications Prior to Visit  Medication Sig Dispense Refill   aspirin 81 MG tablet Take 81 mg by mouth daily.     CALCIUM PO Take 1,200 mg by mouth daily.     Cyanocobalamin (VITAMIN B12 SL) Place 1 tablet under the tongue  daily.     DULoxetine (CYMBALTA) 60 MG capsule Take 1 capsule  Daily for chronic Pain Syndrome8 90 capsule 1   EPINEPHrine 0.3 mg/0.3 mL IJ SOAJ injection Inject 0.3 mg into the muscle once.     famotidine (PEPCID) 40 MG tablet TAKE 1 TABLET EVERY 12 HOURS TO PREVENT HEARTBURN & INDIGESTION 180 tablet 1   fluticasone (FLONASE) 50 MCG/ACT nasal spray PLACE 1-2 SPRAYS INTO BOTH NOSTRILS AT BEDTIME. 48 mL 3   glucose blood (ONE TOUCH ULTRA TEST) test strip Check Blood sugar Daily for diabetes 100 each 3   Lancets (ONETOUCH ULTRASOFT) lancets Check Blood sugar  daily 100 each 3   loperamide (IMODIUM) 2 MG capsule Take 1 capsule (2 mg total) by mouth as needed for diarrhea or loose stools. 30 capsule 0   losartan (COZAAR) 100 MG tablet Take      1 tablet       Daily         for BP and Kidney protection 90 tablet 3   Magnesium 250 MG TABS Take 250 mg by mouth 2 (two) times daily.     metFORMIN (GLUCOPHAGE-XR) 500 MG 24 hr tablet Take  2 tablets 2 x /day with Meals for Diabetes 360 tablet 3   metoprolol succinate (TOPROL-XL) 50 MG 24 hr tablet TAKE 1 TABLET (50 MG TOTAL) BY MOUTH DAILY. TAKE WITH OR IMMEDIATELY FOLLOWING A MEAL. 90 tablet 0   Multiple Vitamins-Minerals (MULTIVITAMIN PO) Take 1 tablet by mouth daily.      nortriptyline (PAMELOR) 10 MG capsule TAKE 1 CAPSULE BY MOUTH EVERYDAY AT BEDTIME 90 capsule 2   Omega-3 Fatty Acids (FISH OIL PO) Take by mouth daily.     omeprazole (PRILOSEC) 20 MG capsule TAKE 1 CAPSULE DAILY TO PREVENT HEARTBURN & ACID REFLUX 90 capsule 1   OZEMPIC, 1 MG/DOSE, 4 MG/3ML SOPN INJECT 1 MG INTO THE SKIN ONCE WEEKLY 9 mL 1   PRESCRIPTION MEDICATION Inject as directed every 30 (thirty) days. Allergy Shot     rosuvastatin (CRESTOR) 10 MG tablet TAKE 1 TABLET BY MOUTH EVERY DAY FOR CHOLESTEROL 90 tablet 1   SYNTHROID 200 MCG tablet TAKE 1 AND 1/2 TABLETS ON WEDNESDAY AND SATURDAY , TAKE 1 TAB DAILY ON THE OTHER DAYS. TAKE ON AN EMPTY STOMACH WITH ONLY WATER FOR 30 MINUTES &  NO ANTACID MEDS, CALCIUM OR MAGNESIUM FOR 4 HOURS & AVOID BIOTIN 102 tablet 1   tiZANidine (ZANAFLEX) 2 MG tablet TAKE 1 TABLET (2 MG TOTAL) BY MOUTH 2 (TWO) TIMES DAILY AS NEEDED FOR MUSCLE SPASMS. 180 tablet 1   Turmeric (QC TUMERIC COMPLEX PO) Take by mouth.     Vitamin D, Ergocalciferol, (DRISDOL) 1.25 MG (50000 UNIT) CAPS capsule Take 1 capsule 1 x /week for Vit D Deficiency 12 capsule 1   ondansetron (ZOFRAN) 8 MG tablet Take 1 tab every 8 hours as needed for nausea. 30 tablet 0   No facility-administered medications prior to visit.    PAST MEDICAL HISTORY: Past Medical History:  Diagnosis Date   Allergy    Anxiety    Arthritis    Asthma    Cataract    Chronic kidney disease (CKD), stage II (mild) 07/01/2014   Overview:  Overview:  Due to DM, last GFR 64    Degenerative tear of triangular fibrocartilage complex (TFCC) of left wrist 01/17/2019   Depression    Fibromyalgia    GERD (gastroesophageal reflux disease)    Hyperlipidemia    Hypertension    Hypoxemia    IBS (irritable bowel syndrome)    Liver disease    Migraine 02/19/2015   Migraines    Obesity    Osteoporosis    Sensorineural hearing loss (SNHL) of left ear with unrestricted hearing of right ear 12/31/2017   Sleep apnea    uses oral device   Sleep difficulties    Thyroid disease    hypothyroidism   Tuberculosis    positive PPD test, chest x ray negative   Type II or unspecified type diabetes mellitus without mention of complication, not stated as uncontrolled    Urticaria    Ventral hernia    Vertigo 02/19/2015    PAST SURGICAL HISTORY: Past Surgical History:  Procedure Laterality Date   ABDOMINAL HYSTERECTOMY     ADENOIDECTOMY     CARPAL TUNNEL RELEASE     bilateral   CATARACT EXTRACTION, BILATERAL     CESAREAN SECTION     x2   CHOLECYSTECTOMY     COLONOSCOPY     HERNIA REPAIR  07/24/11   ventral hernia   JOINT REPLACEMENT     bilateral knees   KNEE ARTHROSCOPY     bilateral   SINOSCOPY      TONSILLECTOMY     TOTAL KNEE ARTHROPLASTY     bilateral    FAMILY HISTORY: Family History  Problem Relation Age of Onset   Breast cancer Mother    Hypertension Father    Hyperlipidemia Father    Prostate cancer Father    Hyperlipidemia Sister    Esophageal cancer Son 32       Died 12/07/2013   Colon cancer Son    Stomach cancer Son    Colon polyps Neg Hx    Diabetes Neg Hx    Kidney disease Neg Hx    Migraines Neg Hx    Rectal cancer Neg Hx     SOCIAL HISTORY: Social History   Socioeconomic History   Marital status: Married    Spouse name: Francee Piccolo   Number of children: 2   Years of education: masters   Highest education level: Not on file  Occupational History   Occupation: Retired Pharmacist, hospital  Tobacco Use   Smoking status: Never   Smokeless tobacco: Never  Vaping Use   Vaping Use: Never used  Substance and Sexual Activity   Alcohol use: Yes    Alcohol/week: 0.0 standard drinks    Comment: occasional   Drug use: No   Sexual activity: Not on file  Other Topics Concern   Not on file  Social History Narrative   Lives with husband, Francee Piccolo   Patient is right handed.   Patient drinks 64 oz of diet soda daily.   Social Determinants of Health   Financial Resource Strain: Not on file  Food Insecurity: Not on file  Transportation Needs: Not on file  Physical Activity: Not on file  Stress: Not on file  Social Connections: Not on file  Intimate Partner Violence: Not on file   PHYSICAL  EXAM  Vitals:   11/17/21 1439  BP: (!) 147/84  Pulse: 84  Weight: 203 lb (92.1 kg)  Height: 5\' 3"  (1.6 m)    Body mass index is 35.96 kg/m.  Generalized: Well developed, in no acute distress Neurological examination  Mentation: Alert oriented to time, place, history taking. Follows all commands speech and language fluent Cranial nerve II-XII: Pupils were equal round reactive to light. Extraocular movements were full, visual field were full on confrontational test. Facial  sensation and strength were normal.  Head turning and shoulder shrug were normal and symmetric.   Motor: The motor testing reveals 5 over 5 strength of all 4 extremities. Good symmetric motor tone is noted throughout.  Sensory: Sensory testing is intact to soft touch on all 4 extremities. No evidence of extinction is noted.  Coordination: Cerebellar testing reveals good finger-nose-finger and heel-to-shin bilaterally.  No tremor seen. Gait and station: Gait is normal.  Reflexes: Deep tendon reflexes are symmetric and normal bilaterally.   DIAGNOSTIC DATA (LABS, IMAGING, TESTING) - I reviewed patient records, labs, notes, testing and imaging myself where available.  Lab Results  Component Value Date   WBC 11.1 (H) 06/24/2021   HGB 14.5 06/24/2021   HCT 44.3 06/24/2021   MCV 88.4 06/24/2021   PLT 285 06/24/2021      Component Value Date/Time   NA 137 06/24/2021 1111   NA 140 07/02/2019 1214   K 3.9 06/24/2021 1111   CL 99 06/24/2021 1111   CO2 29 06/24/2021 1111   GLUCOSE 93 06/24/2021 1111   BUN 12 06/24/2021 1111   BUN 11 07/02/2019 1214   CREATININE 0.92 06/24/2021 1111   CALCIUM 10.2 06/24/2021 1111   PROT 6.8 06/24/2021 1111   ALBUMIN 4.1 10/19/2020 1356   AST 21 06/24/2021 1111   ALT 25 06/24/2021 1111   ALKPHOS 73 10/19/2020 1356   BILITOT 0.7 06/24/2021 1111   GFRNONAA 82 12/22/2020 1625   GFRAA 94 12/22/2020 1625   Lab Results  Component Value Date   CHOL 118 06/24/2021   HDL 39 (L) 06/24/2021   LDLCALC 55 06/24/2021   TRIG 161 (H) 06/24/2021   CHOLHDL 3.0 06/24/2021   Lab Results  Component Value Date   HGBA1C 6.2 (H) 06/24/2021   Lab Results  Component Value Date   VITAMINB12 287 06/14/2020   Lab Results  Component Value Date   TSH 7.79 (H) 06/24/2021   ASSESSMENT AND PLAN 71 y.o. year old female  has a past medical history of Allergy, Anxiety, Arthritis, Asthma, Cataract, Chronic kidney disease (CKD), stage II (mild) (07/01/2014), Degenerative tear  of triangular fibrocartilage complex (TFCC) of left wrist (01/17/2019), Depression, Fibromyalgia, GERD (gastroesophageal reflux disease), Hyperlipidemia, Hypertension, Hypoxemia, IBS (irritable bowel syndrome), Liver disease, Migraine (02/19/2015), Migraines, Obesity, Osteoporosis, Sensorineural hearing loss (SNHL) of left ear with unrestricted hearing of right ear (12/31/2017), Sleep apnea, Sleep difficulties, Thyroid disease, Tuberculosis, Type II or unspecified type diabetes mellitus without mention of complication, not stated as uncontrolled, Urticaria, Ventral hernia, and Vertigo (02/19/2015). here with:  1.  Migraine headache 2.  Tremor  -Gael is doing much better in regards to her headaches -Continue nortriptyline 10 mg at bedtime -Will discontinue tizanidine, switch back to baclofen 10 mg as needed for acute headache -Continue metoprolol, dual benefit for tremor and headache prevention -Follow-up in 1 year or sooner if needed, will be followed by Dr. Kathyrn Sheriff, AGNP-C, DNP 11/17/2021, 2:46 PM [facility] [address], [place],  [place] [address] [phone]

## 2021-11-17 NOTE — Patient Instructions (Addendum)
Great to see you! Continue current medications See you back in 1 year

## 2021-11-24 ENCOUNTER — Ambulatory Visit (INDEPENDENT_AMBULATORY_CARE_PROVIDER_SITE_OTHER): Payer: Medicare HMO | Admitting: Allergy & Immunology

## 2021-11-24 ENCOUNTER — Encounter: Payer: Self-pay | Admitting: Allergy & Immunology

## 2021-11-24 ENCOUNTER — Other Ambulatory Visit: Payer: Self-pay

## 2021-11-24 VITALS — BP 120/70 | HR 106 | Temp 97.3°F | Resp 98 | Ht 63.0 in | Wt 205.2 lb

## 2021-11-24 DIAGNOSIS — J3089 Other allergic rhinitis: Secondary | ICD-10-CM

## 2021-11-24 DIAGNOSIS — J302 Other seasonal allergic rhinitis: Secondary | ICD-10-CM

## 2021-11-24 MED ORDER — EPINEPHRINE 0.3 MG/0.3ML IJ SOAJ: 0.3000 mg | Freq: Once | INTRAMUSCULAR | 1 refills | Status: AC

## 2021-11-24 NOTE — Patient Instructions (Addendum)
1. Allergic rhinoconjunctivitis - on allergy shots - Continue with allergy shots at the same schedule.  - Continue with allergy shots every 4 weeks.   2. Return in about 1 year (around 11/24/2022).   Please inform us of any Emergency Department visits, hospitalizations, or changes in symptoms. Call us before going to the ED for breathing or allergy symptoms since we might be able to fit you in for a sick visit. Feel free to contact us anytime with any questions, problems, or concerns.  It was a pleasure to see you again today!  Websites that have reliable patient information: 1. American Academy of Asthma, Allergy, and Immunology: www.aaaai.org 2. Food Allergy Research and Education (FARE): foodallergy.org 3. Mothers of Asthmatics: http://www.asthmacommunitynetwork.org 4. American College of Allergy, Asthma, and Immunology: www.acaai.org   COVID-19 Vaccine Information can be found at: ShippingScam.co.uk For questions related to vaccine distribution or appointments, please email [email or web address] [email or web address] or call [phone].     Like [place] on [place] and Instagram for our latest updates!       Make sure you are registered to vote! If you have moved or changed any of your contact information, you will need to get this updated before voting!  In some cases, you MAY be able to register to vote online: [email or web address]

## 2021-11-24 NOTE — Progress Notes (Signed)
FOLLOW UP  Date of Service/Encounter:  11/24/21   Assessment:   Seasonal and perennial allergic rhinitis - stable on allergen immunotherapy  Plan/Recommendations:   1. Allergic rhinoconjunctivitis - on allergy shots - Continue with allergy shots at the same schedule.  - Continue with allergy shots every 4 weeks.   2. Return in about 1 year (around 11/24/2022).   Subjective:   Grace Barry is a 71 y.o. female presenting today for follow up of  Chief Complaint  Patient presents with   Follow-up    Grace Barry has a history of the following: Patient Active Problem List   Diagnosis Date Noted   Hypoxemia associated with sleep 10/02/2020   Leukocytosis 09/27/2020   Tremor 07/28/2020   Uncontrolled REM sleep behavior disorder 07/20/2020   Sleep paralysis, recurrent isolated 07/20/2020   Bad dreams 07/20/2020   Sleep related headaches 07/20/2020   Fatigue due to depression 07/20/2020   Psychophysiological insomnia 07/20/2020   Aortic atherosclerosis ([place]) 12/12/2019   Pain 01/17/2019   Hyperlipidemia associated with type 2 diabetes mellitus ([place]) 11/07/2018   Osteopenia 11/07/2018   Seasonal and perennial allergic rhinitis 07/17/2018   Vitamin D deficiency disease 05/19/2015   Medication management 05/19/2015   Migraine 02/19/2015   Vertigo 02/19/2015   History of colonic polyps 01/15/2015   IBS (irritable bowel syndrome) 09/01/2014   Morbid obesity ([place]) 07/01/2014   CKD stage 2 due to type 2 diabetes mellitus ([place]) 07/01/2014   Fatty liver    Arthritis    Hypothyroidism 12/15/2010   T2_NIDDM w/CKD (GFR 56 ml/min) 12/15/2010   Depression, major, recurrent, in remission ([place]) 12/15/2010   Obstructive sleep apnea 12/15/2010   Essential hypertension 12/15/2010   GERD 12/15/2010    History obtained from: chart review and patient.  Grace Barry is a 71 y.o. female presenting for a follow up visit.  She was last seen in January 2022.  At that time, asthma was  controlled with albuterol as needed.  We decided just to remain asthma as a problem since she had been asymptomatic.  For her allergic rhinitis, we continue with allergy shots at the same schedule.  Since last visit, she has done well. She does travel to [place] a lot and she was in the [place] for almost one month. This was a travel with a company. She is going to [place] and [place] later this year. This is 23 days. They lived in [place] back in 1991. Her husband is a Scientist, clinical (histocompatibility and immunogenetics) and [place] was the home base of the company.   Kip is on allergen immunotherapy. She receives two injections. Immunotherapy script #1 contains molds, cat and dog. She currently receives 0.90mL of the RED vial (1/100). Immunotherapy script #2 contains trees, weeds and grasses. She currently receives 0.63mL of the RED vial (1/100). She started shots 2011 and reached maintenance in 2012. She is getting them monthly. She denies reactions.  Shots are still working very well. She is very fearful of stopping them and wants to keep getting them.  She has not required antibiotics or prednisone at all since last visit.  She is going to be hosting her stepdaughter and her family this coming weekend.  They apparently live [place] and they really see them due to family drama.  She was actually grown and out of the house when the rest of the family moved to [place] for a year.  Apparently she thought that she should have gotten as well, so 30  years later this apparently is still an issue.  However, [name] is hoping that everyone can be past this.  She has a son who lives in [place].  Her other son passed away now 8 years ago.  Otherwise, there have been no changes to her past medical history, surgical history, family history, or social history.    Review of Systems  Constitutional: Negative.  Negative for chills, fever, malaise/fatigue and weight loss.  HENT: Negative.  Negative for congestion, ear discharge and ear  pain.   Eyes:  Negative for pain, discharge and redness.  Respiratory:  Negative for cough, sputum production, shortness of breath and wheezing.   Cardiovascular: Negative.  Negative for chest pain and palpitations.  Gastrointestinal:  Negative for abdominal pain, heartburn, nausea and vomiting.  Skin: Negative.  Negative for itching and rash.  Neurological:  Negative for dizziness and headaches.  Endo/Heme/Allergies:  Negative for environmental allergies. Does not bruise/bleed easily.      Objective:   Blood pressure 120/70, pulse (!) 106, temperature (!) 97.3 F (36.3 C), temperature source Temporal, resp. rate (!) 98, height 5\' 3"  (1.6 m), weight 205 lb 3.2 oz (93.1 kg), SpO2 98 %. Body mass index is 36.35 kg/m.   Physical Exam:  Physical Exam Vitals reviewed.  Constitutional:      Appearance: She is well-developed.  HENT:     Head: Normocephalic and atraumatic.     Right Ear: Tympanic membrane, ear canal and external ear normal.     Left Ear: Tympanic membrane and ear canal normal.     Nose: No nasal deformity, septal deviation, mucosal edema or rhinorrhea.     Right Sinus: No maxillary sinus tenderness or frontal sinus tenderness.     Left Sinus: No maxillary sinus tenderness or frontal sinus tenderness.     Mouth/Throat:     Mouth: Mucous membranes are not pale and not dry.     Pharynx: Uvula midline.  Eyes:     General:        Right eye: No discharge.        Left eye: No discharge.     Conjunctiva/sclera: Conjunctivae normal.     Right eye: Right conjunctiva is not injected. No chemosis.    Left eye: Left conjunctiva is not injected. No chemosis.    Pupils: Pupils are equal, round, and reactive to light.  Cardiovascular:     Rate and Rhythm: Normal rate and regular rhythm.     Heart sounds: Normal heart sounds.  Pulmonary:     Effort: Pulmonary effort is normal. No tachypnea, accessory muscle usage or respiratory distress.     Breath sounds: Normal breath  sounds. No wheezing, rhonchi or rales.  Chest:     Chest wall: No tenderness.  Lymphadenopathy:     Cervical: No cervical adenopathy.  Skin:    Coloration: Skin is not pale.     Findings: No abrasion, erythema, petechiae or rash. Rash is not papular, urticarial or vesicular.  Neurological:     Mental Status: She is alert.  Psychiatric:        Behavior: Behavior is cooperative.     Diagnostic studies: none        Salvatore Marvel, MD  [facility]

## 2021-12-03 ENCOUNTER — Other Ambulatory Visit: Payer: Self-pay | Admitting: Adult Health

## 2021-12-03 DIAGNOSIS — E1122 Type 2 diabetes mellitus with diabetic chronic kidney disease: Secondary | ICD-10-CM

## 2021-12-03 DIAGNOSIS — N183 Chronic kidney disease, stage 3 unspecified: Secondary | ICD-10-CM

## 2021-12-12 DIAGNOSIS — R0902 Hypoxemia: Secondary | ICD-10-CM | POA: Diagnosis not present

## 2021-12-12 DIAGNOSIS — G4733 Obstructive sleep apnea (adult) (pediatric): Secondary | ICD-10-CM | POA: Diagnosis not present

## 2021-12-14 DIAGNOSIS — E119 Type 2 diabetes mellitus without complications: Secondary | ICD-10-CM | POA: Diagnosis not present

## 2021-12-14 LAB — HM DIABETES EYE EXAM

## 2021-12-16 ENCOUNTER — Other Ambulatory Visit: Payer: Self-pay | Admitting: Internal Medicine

## 2021-12-16 DIAGNOSIS — F3340 Major depressive disorder, recurrent, in remission, unspecified: Secondary | ICD-10-CM

## 2021-12-16 DIAGNOSIS — M654 Radial styloid tenosynovitis [de Quervain]: Secondary | ICD-10-CM | POA: Diagnosis not present

## 2021-12-16 DIAGNOSIS — M79641 Pain in right hand: Secondary | ICD-10-CM | POA: Diagnosis not present

## 2021-12-22 ENCOUNTER — Ambulatory Visit (INDEPENDENT_AMBULATORY_CARE_PROVIDER_SITE_OTHER): Payer: Medicare HMO | Admitting: Adult Health

## 2021-12-22 ENCOUNTER — Encounter: Payer: Self-pay | Admitting: Adult Health

## 2021-12-22 ENCOUNTER — Ambulatory Visit (INDEPENDENT_AMBULATORY_CARE_PROVIDER_SITE_OTHER): Payer: Medicare HMO | Admitting: *Deleted

## 2021-12-22 ENCOUNTER — Other Ambulatory Visit: Payer: Self-pay

## 2021-12-22 VITALS — BP 120/74 | HR 90 | Temp 96.5°F | Wt 205.0 lb

## 2021-12-22 DIAGNOSIS — M1990 Unspecified osteoarthritis, unspecified site: Secondary | ICD-10-CM

## 2021-12-22 DIAGNOSIS — R69 Illness, unspecified: Secondary | ICD-10-CM | POA: Diagnosis not present

## 2021-12-22 DIAGNOSIS — G43009 Migraine without aura, not intractable, without status migrainosus: Secondary | ICD-10-CM | POA: Diagnosis not present

## 2021-12-22 DIAGNOSIS — E039 Hypothyroidism, unspecified: Secondary | ICD-10-CM

## 2021-12-22 DIAGNOSIS — R42 Dizziness and giddiness: Secondary | ICD-10-CM

## 2021-12-22 DIAGNOSIS — F3340 Major depressive disorder, recurrent, in remission, unspecified: Secondary | ICD-10-CM | POA: Diagnosis not present

## 2021-12-22 DIAGNOSIS — Z9189 Other specified personal risk factors, not elsewhere classified: Secondary | ICD-10-CM

## 2021-12-22 DIAGNOSIS — I700 Atherosclerosis of aorta: Secondary | ICD-10-CM | POA: Diagnosis not present

## 2021-12-22 DIAGNOSIS — R6889 Other general symptoms and signs: Secondary | ICD-10-CM | POA: Diagnosis not present

## 2021-12-22 DIAGNOSIS — F5104 Psychophysiologic insomnia: Secondary | ICD-10-CM

## 2021-12-22 DIAGNOSIS — E785 Hyperlipidemia, unspecified: Secondary | ICD-10-CM

## 2021-12-22 DIAGNOSIS — K760 Fatty (change of) liver, not elsewhere classified: Secondary | ICD-10-CM

## 2021-12-22 DIAGNOSIS — N182 Chronic kidney disease, stage 2 (mild): Secondary | ICD-10-CM | POA: Diagnosis not present

## 2021-12-22 DIAGNOSIS — Z0001 Encounter for general adult medical examination with abnormal findings: Secondary | ICD-10-CM | POA: Diagnosis not present

## 2021-12-22 DIAGNOSIS — I10 Essential (primary) hypertension: Secondary | ICD-10-CM

## 2021-12-22 DIAGNOSIS — G4736 Sleep related hypoventilation in conditions classified elsewhere: Secondary | ICD-10-CM

## 2021-12-22 DIAGNOSIS — Z86010 Personal history of colonic polyps: Secondary | ICD-10-CM

## 2021-12-22 DIAGNOSIS — Z79899 Other long term (current) drug therapy: Secondary | ICD-10-CM

## 2021-12-22 DIAGNOSIS — E1122 Type 2 diabetes mellitus with diabetic chronic kidney disease: Secondary | ICD-10-CM

## 2021-12-22 DIAGNOSIS — J309 Allergic rhinitis, unspecified: Secondary | ICD-10-CM | POA: Diagnosis not present

## 2021-12-22 DIAGNOSIS — M8580 Other specified disorders of bone density and structure, unspecified site: Secondary | ICD-10-CM

## 2021-12-22 DIAGNOSIS — G4733 Obstructive sleep apnea (adult) (pediatric): Secondary | ICD-10-CM

## 2021-12-22 DIAGNOSIS — E1169 Type 2 diabetes mellitus with other specified complication: Secondary | ICD-10-CM

## 2021-12-22 DIAGNOSIS — E1129 Type 2 diabetes mellitus with other diabetic kidney complication: Secondary | ICD-10-CM

## 2021-12-22 DIAGNOSIS — E559 Vitamin D deficiency, unspecified: Secondary | ICD-10-CM

## 2021-12-22 DIAGNOSIS — Z0000 Encounter for general adult medical examination without abnormal findings: Secondary | ICD-10-CM

## 2021-12-22 LAB — LIPID PANEL
Cholesterol: 116 mg/dL (ref <200)
HDL: 45 mg/dL — ABNORMAL LOW (ref >=50)
LDL Cholesterol (Calc): 50 mg/dL (calc)
Non-HDL Cholesterol (Calc): 71 mg/dL (calc) (ref <130)
Total CHOL/HDL Ratio: 2.6 (calc) (ref <5.0)
Triglycerides: 127 mg/dL (ref <150)

## 2021-12-22 LAB — COMPLETE METABOLIC PANEL WITH GFR
AG Ratio: 2 (calc) (ref 1.0–2.5)
ALT: 18 U/L (ref 6–29)
AST: 18 U/L (ref 10–35)
Albumin: 4.6 g/dL (ref 3.6–5.1)
Alkaline phosphatase (APISO): 92 U/L (ref 37–153)
BUN: 12 mg/dL (ref 7–25)
CO2: 31 mmol/L (ref 20–32)
Calcium: 10 mg/dL (ref 8.6–10.4)
Chloride: 98 mmol/L (ref 98–110)
Creat: 0.89 mg/dL (ref 0.60–1.00)
Globulin: 2.3 g/dL (calc) (ref 1.9–3.7)
Glucose, Bld: 76 mg/dL (ref 65–99)
Potassium: 4.4 mmol/L (ref 3.5–5.3)
Sodium: 137 mmol/L (ref 135–146)
Total Bilirubin: 0.5 mg/dL (ref 0.2–1.2)
Total Protein: 6.9 g/dL (ref 6.1–8.1)
eGFR: 69 mL/min/{1.73_m2} (ref >=60)

## 2021-12-22 LAB — CBC WITH DIFFERENTIAL/PLATELET
Absolute Monocytes: 904 cells/uL (ref 200–950)
Basophils Absolute: 56 cells/uL (ref 0–200)
Basophils Relative: 0.4 %
Eosinophils Absolute: 1487 cells/uL — ABNORMAL HIGH (ref 15–500)
Eosinophils Relative: 10.7 %
HCT: 43.6 % (ref 35.0–45.0)
Hemoglobin: 14.5 g/dL (ref 11.7–15.5)
Lymphs Abs: 3697 cells/uL (ref 850–3900)
MCH: 28.7 pg (ref 27.0–33.0)
MCHC: 33.3 g/dL (ref 32.0–36.0)
MCV: 86.2 fL (ref 80.0–100.0)
MPV: 11.2 fL (ref 7.5–12.5)
Monocytes Relative: 6.5 %
Neutro Abs: 7756 cells/uL (ref 1500–7800)
Neutrophils Relative %: 55.8 %
Platelets: 316 10*3/uL (ref 140–400)
RBC: 5.06 10*6/uL (ref 3.80–5.10)
RDW: 13.3 % (ref 11.0–15.0)
Total Lymphocyte: 26.6 %
WBC: 13.9 10*3/uL — ABNORMAL HIGH (ref 3.8–10.8)

## 2021-12-22 LAB — HEMOGLOBIN A1C
Hgb A1c MFr Bld: 6 % of total Hgb — ABNORMAL HIGH (ref <5.7)
Mean Plasma Glucose: 126 mg/dL
eAG (mmol/L): 7 mmol/L

## 2021-12-22 LAB — TSH: TSH: 0.2 mIU/L — ABNORMAL LOW (ref 0.40–4.50)

## 2021-12-22 LAB — MAGNESIUM: Magnesium: 1.9 mg/dL (ref 1.5–2.5)

## 2021-12-22 MED ORDER — VITAMIN D (ERGOCALCIFEROL) 1.25 MG (50000 UNIT) PO CAPS: ORAL_CAPSULE | ORAL | 1 refills | Status: DC

## 2021-12-22 NOTE — Patient Instructions (Signed)
°  Ms. Youngers , Thank you for taking time to come for your Medicare Wellness Visit. I appreciate your ongoing commitment to your health goals. Please review the following plan we discussed and let me know if I can assist you in the future.   These are the goals we discussed:  Goals      Exercise 3 x per week (10 min per time)        This is a list of the screening recommended for you and due dates:  Health Maintenance  Topic Date Due   Eye exam for diabetics  07/13/2021   Zoster (Shingles) Vaccine (2 of 2) 09/19/2021   COVID-19 Vaccine (4 - Booster for Pfizer series) 01/07/2022*   Hemoglobin A1C  12/25/2021   Mammogram  02/22/2022   Complete foot exam   12/22/2022   Colon Cancer Screening  01/21/2023   DEXA scan (bone density measurement)  02/23/2023   Tetanus Vaccine  08/09/2025   Pneumonia Vaccine  Completed   Flu Shot  Completed   Hepatitis C Screening: USPSTF Recommendation to screen - Ages 18-79 yo.  Completed   HPV Vaccine  Aged Out  *Topic was postponed. The date shown is not the original due date.

## 2021-12-22 NOTE — Progress Notes (Signed)
MEDICARE ANNUAL WELLNESS VISIT AND FOLLOW UP  Assessment:   Annual Medicare Wellness Visit Due annually  Health maintenance reviewed  Diabetes eye exam report requested  Atherosclerosis of aorta ([place]) Per CT 07/2019 Control blood pressure, cholesterol, glucose, increase exercise.    Essential hypertension - continue medications, DASH diet, exercise and monitor at home. Call if greater than 130/80.  - CBC with Differential/Platelet - CMP/GFR - TSH  Asthma, unspecified asthma severity, uncomplicated controlled  Gastroesophageal reflux disease without esophagitis Continue PPI/H2 blocker, diet discussed  Other specified hypothyroidism Hypothyroidism-check TSH level, continue medications the same, reminded to take on an empty stomach 30-71mns before food.   Diabetes mellitus due to underlying condition with diabetic chronic kidney disease ([place] Discussed general issues about diabetes pathophysiology and management., Educational material distributed., Suggested low cholesterol diet., Encouraged aerobic exercise., Discussed foot care., Reminded to get yearly retinal exam - report requested - Hemoglobin A1c  CKD stage 2 associated with T2DM (HCC) Increase fluids, avoid NSAIDS, monitor sugars, will monitor - CMP WITH GFR  Hyperlipidemia associated with T2DM (HCC)  -continue medications, check lipids, decrease fatty foods, increase activity.  - Lipid panel  Medication management - Magnesium  Arthritis RICE, NSAIDS PRN, ortho referral if needed  Diarrhea IBS, controlled   History of colonic polyps UTD colonoscopy  Depression, major, recurrent, in remission ([place] In remission on medications; continue cymbalta, amitriptyline Lifestyle discussed: diet/exerise, sleep hygiene, stress management, hydration  Morbid obesity (HCC) - increase veggies, decrease carbs - long discussion about weight loss, diet, and exercise - try to add 10 min of walking 3 days a week - avoid  emotional/habitual eating, high fiber diet  Hypothyroidism continue medications the same pending lab results reminded to take on an empty stomach 30-653ms before food.  check TSH level  Liver disease/ fatty liver disease Check labs, avoid tylenol, alcohol, weight loss advised.    Obstructive sleep apnea ? No longer has per most recent sleep study; Dr. DoBrett Fairyollowing - Weight loss encouraged  Nocturnal hypoxia  On 2L O2 [place] via pulmonology  Osteopenia DEXA q2y continue Vit D and Ca, weight bearing exercises  Sedentary lifestyle Discussed benefits of exercise, start with goal 10 min 3 days a week with taper up   Orders Placed This Encounter  Procedures   CBC with Differential/Platelet   COMPLETE METABOLIC PANEL WITH GFR   Magnesium   Lipid panel   TSH   Hemoglobin A1c    Over 30 minutes of exam, counseling, chart review, and critical decision making was performed  Future Appointments  Date Time Provider [place]7/24/2023  9:00 AM Pleasant, FrEppie GibsonRN [facility] None  06/26/2022 10:00 AM MuMagda BernheimNP [facility] None  11/23/2022  2:45 PM SlSuzzanne CloudNP [facility] None  12/26/2022  3:00 PM CoLiane ComberNP [facility] None     Plan:   During the course of the visit the patient was educated and counseled about appropriate screening and preventive services including:   Pneumococcal vaccine  Influenza vaccine Td vaccine Screening electrocardiogram Screening mammography Bone densitometry screening Colorectal cancer screening Diabetes screening Glaucoma screening Nutrition counseling  Advanced directives: given info/requested   Subjective:   Grace Barry is a 71.0. female who presents for Welcome to medicare visit and 3 month follow up. She has Hypothyroidism; T2_NIDDM w/CKD ([place] Depression, major, recurrent, in remission ([place] Obstructive sleep apnea; Essential hypertension; GERD; Fatty liver; Arthritis; Morbid obesity ([place] History of  colonic polyps; Migraine; Vertigo; Vitamin D deficiency  disease; Medication management; CKD stage 2 due to type 2 diabetes mellitus ([place]); Seasonal and perennial allergic rhinitis; Hyperlipidemia associated with type 2 diabetes mellitus ([place]); Osteopenia; Aortic atherosclerosis (HCC); IBS (irritable bowel syndrome); Pain; Uncontrolled REM sleep behavior disorder; Sleep paralysis, recurrent isolated; Bad dreams; Psychophysiological insomnia; Tremor; Leukocytosis; and Hypoxemia associated with sleep on their problem list.  She has hx of sleep apnea, was on oral device for many years; she was having severe dreams and concern for REM disorder per Dr. Brett Fairy, home study was inconclusive, had follow sleep study 09/27/2020 showing significant hypoxemia, oxygen level was 90% with desaturations to about 84%, had unremarkable cardiology and pulm work up, recently started on 2L O2 by [place], has improved AM HA.   She has GERD, taking omeprazole 20 mg PM and famotidine 40 mg PM and no longer having breakthrough.  She has hx of major depression, chronic widespread myalgias suspected for fibromyalgia, switched from paxil to cymbalta with benefit. She has history of migraines, improved on nortriptyline, she has been following with Dr. Jannifer Franklin for balance and mental fogging, changed some medications which helped.   BMI is Body mass index is 36.31 kg/m., she has not been working on diet and exercise. She eats 1-2 meals a day. Plenty of water. Irregular with exercise, receptive to starting 10 mg 3 days a week on treadmill  Wt Readings from Last 3 Encounters:  12/22/21 205 lb (93 kg)  11/24/21 205 lb 3.2 oz (93.1 kg)  11/17/21 203 lb (92.1 kg)   She had CT coronary 07/2019 -CAD-RADS 1. Minimal non-obstructive CAD (0-24%) but did show aortic atherosclerosis She recently had benign ECHO in 12/08/2020 with EF of 60-65%  Her blood pressure has been controlled at home,  Metoprolol 19m XL for palpitations, losartan 100 mg,BP:  120/74  She does not workout. She denies chest pain, shortness of breath, dizziness.   She is on cholesterol medication, crestor 10 mg daily and denies myalgias. Her cholesterol is at goal. The cholesterol was:   Lab Results  Component Value Date   CHOL 118 06/24/2021   HDL 39 (L) 06/24/2021   LDLCALC 55 06/24/2021   TRIG 161 (H) 06/24/2021   CHOLHDL 3.0 06/24/2021   She has been working on diet and exercise for Diabetes with CKD III controlled on metformin and ozempic 1 mg/week - SE with invokana, she is on bASA, she is on ACE/ARB, and denies paresthesia of the feet, polydipsia, polyuria and visual disturbances.  She checks occasional fasting, around 110, does have supplies.  Last A1C was:  Lab Results  Component Value Date   HGBA1C 6.2 (H) 06/24/2021    She has CKD II associated with T2DM monitored at this office:  Lab Results  Component Value Date   EGFR 67 06/24/2021   Patient is on Vitamin D supplement, taking 50000 IU once a week.  Lab Results  Component Value Date   VD25OH 83908/26/2022   She is on thyroid medication. Her medication was not changed last visit, taking 200 mcg daily but admits was missing, has been taking 200 mcg daily.  Lab Results  Component Value Date   TSH 7.79 (H) 06/24/2021     Medication Review Current Outpatient Medications on File Prior to Visit  Medication Sig   aspirin 81 MG tablet Take 81 mg by mouth daily.   baclofen (LIORESAL) 10 MG tablet Take 1 tablet (10 mg total) by mouth 2 (two) times daily as needed for muscle spasms.   CALCIUM PO  Take 1,200 mg by mouth daily.   Cyanocobalamin (VITAMIN B12 SL) Place 1 tablet under the tongue daily.   DULoxetine (CYMBALTA) 60 MG capsule TAKE 1 CAPSULE DAILY FOR CHRONIC PAIN SYNDROME.   EPINEPHrine 0.3 mg/0.3 mL IJ SOAJ injection Inject 0.3 mg into the muscle once.   famotidine (PEPCID) 40 MG tablet TAKE 1 TABLET EVERY 12 HOURS TO PREVENT HEARTBURN & INDIGESTION   fluticasone (FLONASE) 50 MCG/ACT  nasal spray PLACE 1-2 SPRAYS INTO BOTH NOSTRILS AT BEDTIME.   glucose blood (ONE TOUCH ULTRA TEST) test strip Check Blood sugar Daily for diabetes   Lancets (ONETOUCH ULTRASOFT) lancets Check Blood sugar  daily   loperamide (IMODIUM) 2 MG capsule Take 1 capsule (2 mg total) by mouth as needed for diarrhea or loose stools.   losartan (COZAAR) 100 MG tablet Take      1 tablet       Daily         for BP and Kidney protection   Magnesium 250 MG TABS Take 250 mg by mouth 2 (two) times daily.   metFORMIN (GLUCOPHAGE-XR) 500 MG 24 hr tablet Take  2 tablets 2 x /day with Meals for Diabetes   metoprolol succinate (TOPROL-XL) 50 MG 24 hr tablet Take 1 tablet (50 mg total) by mouth daily. Take with or immediately following a meal.   Multiple Vitamins-Minerals (MULTIVITAMIN PO) Take 1 tablet by mouth daily.    nortriptyline (PAMELOR) 10 MG capsule TAKE 1 CAPSULE BY MOUTH EVERYDAY AT BEDTIME   Omega-3 Fatty Acids (FISH OIL PO) Take by mouth daily.   omeprazole (PRILOSEC) 20 MG capsule TAKE 1 CAPSULE DAILY TO PREVENT HEARTBURN & ACID REFLUX   OZEMPIC, 1 MG/DOSE, 4 MG/3ML SOPN INJECT 1 MG INTO THE SKIN ONCE WEEKLY   PRESCRIPTION MEDICATION Inject as directed every 30 (thirty) days. Allergy Shot   rosuvastatin (CRESTOR) 10 MG tablet TAKE 1 TABLET BY MOUTH EVERY DAY FOR CHOLESTEROL   SYNTHROID 200 MCG tablet TAKE 1 AND 1/2 TABLETS ON WEDNESDAY AND SATURDAY , TAKE 1 TAB DAILY ON THE OTHER DAYS. TAKE ON AN EMPTY STOMACH WITH ONLY WATER FOR 30 MINUTES & NO ANTACID MEDS, CALCIUM OR MAGNESIUM FOR 4 HOURS & AVOID BIOTIN   Turmeric (QC TUMERIC COMPLEX PO) Take by mouth.   No current facility-administered medications on file prior to visit.    Current Problems (verified) Patient Active Problem List   Diagnosis Date Noted   Hypoxemia associated with sleep 10/02/2020   Leukocytosis 09/27/2020   Tremor 07/28/2020   Uncontrolled REM sleep behavior disorder 07/20/2020   Sleep paralysis, recurrent isolated 07/20/2020    Bad dreams 07/20/2020   Psychophysiological insomnia 07/20/2020   Aortic atherosclerosis ([place]) 12/12/2019   Pain 01/17/2019   Hyperlipidemia associated with type 2 diabetes mellitus ([place]) 11/07/2018   Osteopenia 11/07/2018   Seasonal and perennial allergic rhinitis 07/17/2018   Vitamin D deficiency disease 05/19/2015   Medication management 05/19/2015   Migraine 02/19/2015   Vertigo 02/19/2015   History of colonic polyps 01/15/2015   IBS (irritable bowel syndrome) 09/01/2014   Morbid obesity ([place]) 07/01/2014   CKD stage 2 due to type 2 diabetes mellitus ([place]) 07/01/2014   Fatty liver    Arthritis    Hypothyroidism 12/15/2010   T2_NIDDM w/CKD ([place]) 12/15/2010   Depression, major, recurrent, in remission ([place]) 12/15/2010   Obstructive sleep apnea 12/15/2010   Essential hypertension 12/15/2010   GERD 12/15/2010    Screening Tests Immunization History  Administered Date(s) Administered   Influenza,  High Dose Seasonal PF 08/10/2015, 08/10/2016, 08/28/2017, 11/07/2018, 08/07/2019, 09/21/2020, 07/25/2021   Influenza-Unspecified 08/05/2019   PFIZER(Purple Top)SARS-COV-2 Vaccination 11/19/2019, 12/10/2019, 08/05/2020   Pneumococcal Conjugate-13 07/01/2014   Pneumococcal Polysaccharide-23 10/09/2002, 05/26/2008, 12/13/2016   Td 10/09/2002, 10/30/2010   Tdap 08/10/2015   Zoster Recombinat (Shingrix) 07/25/2021   Zoster, Live 06/20/2011   Health Maintenance  Topic Date Due   OPHTHALMOLOGY EXAM  07/13/2021   Zoster Vaccines- Shingrix (2 of 2) 09/19/2021   COVID-19 Vaccine (4 - Booster for Pfizer series) 01/07/2022 (Originally 09/30/2020)   HEMOGLOBIN A1C  12/25/2021   MAMMOGRAM  02/22/2022   FOOT EXAM  12/22/2022   COLONOSCOPY (Pts 45-17yr Insurance coverage will need to be confirmed)  01/21/2023   DEXA SCAN  02/23/2023   TETANUS/TDAP  08/09/2025   Pneumonia Vaccine 71 Years old  Completed   INFLUENZA VACCINE  Completed   Hepatitis C Screening  Completed   HPV VACCINES   Aged Out   Shingrix: [facility]25[facility], pending second   Pap: 2009 remote declines another [facility] [facility], R fem neck T-1.9  Colonoscopy: 12/2019 Dr. NSabino Gasser 3 year recall  EGD: 10/2014 + gastritis  CT coronary 07/2019 -CAD-RADS 1. Minimal non-obstructive CAD (0-24%).  aortic atherosclerosis  Names of Other Physician/Practitioners you currently use: 1. [facility]- here for primary care 2. Dr. OSyrian [place] eye doctor, last visit this week 11/2021, report requested 3. Dr. FBetsey Holiday dentist, last visit 2023, goes q 6 months  Patient Care Team: MUnk Pinto MD as PCP - General (Internal Medicine) WJola Baptist DC as Anesthesiologist (Chiropractic Medicine) Ple[place], FEppie Gibson RN as [place]   Allergies Allergies  Allergen Reactions   Floxin [Ofloxacin] Hives   Invokana [Canagliflozin]     Hematuria- avoid class   Viberzi [Eluxadoline] Hives   Doxycycline Rash   Penicillins Rash    SURGICAL HISTORY She  has a past surgical history that includes Total knee arthroplasty; Cholecystectomy; Abdominal hysterectomy; Cesarean section; Tonsillectomy; Knee arthroscopy; Carpal tunnel release; Hernia repair (07/24/11); Joint replacement; Adenoidectomy; Sinoscopy; Cataract extraction, bilateral; and Colonoscopy. FAMILY HISTORY Her family history includes Breast cancer in her mother; Colon cancer in her son; Esophageal cancer (age of onset: 318 in her son; Hyperlipidemia in her father and sister; Hypertension in her father; Prostate cancer in her father; Stomach cancer in her son. SOCIAL HISTORY She  reports that she has never smoked. She has never used smokeless tobacco. She reports current alcohol use. She reports that she does not use drugs.  MEDICARE WELLNESS OBJECTIVES: Physical activity: Current Exercise Habits: Home exercise routine, Intensity: Mild, Exercise limited by:  None identified Cardiac risk factors: Cardiac Risk Factors include: dyslipidemia;hypertension;obesity (BMI >30kg/m2);sedentary lifestyle;diabetes mellitus;advanced age (>565m, >6>8omen) Depression/mood screen:   [facility] 12/22/2021  Decreased Interest 0  Down, Depressed, Hopeless 0  PHQ - 2 Score 0  Altered sleeping -  Tired, decreased energy -  Change in appetite -  Feeling bad or failure about yourself  -  Trouble concentrating -  Moving slowly or fidgety/restless -  Suicidal thoughts -  PHQ-9 Score -  Difficult doing work/chores -  Some recent data might be hidden    ADLs:  In your present state of health, do you have any difficulty performing the following activities: 12/22/2021 12/22/2020  Hearing? N N  Vision? N N  Difficulty concentrating or making decisions? N N  Walking or climbing stairs? N N  Dressing or bathing? N N  Doing errands, shopping? N N  Some recent data might be hidden     Cognitive Testing  Alert? Yes  Normal Appearance?Yes  Oriented to person? Yes  Place? Yes   Time? Yes  Recall of three objects?  Yes  Can perform simple calculations? Yes  Displays appropriate judgment?Yes  Can read the correct time from a watch face?Yes  EOL planning: Does Patient Have a Medical Advance Directive?: Yes Type of Advance Directive: Healthcare Power of Attorney, Living will Does patient want to make changes to medical advance directive?: No - Patient declined Copy of [place] in Chart?: No - copy requested   Review of Systems:  Review of Systems  Constitutional: Negative.  Negative for malaise/fatigue and weight loss.  HENT:  Negative for congestion, ear discharge, ear pain, hearing loss, nosebleeds, sore throat and tinnitus.   Eyes: Negative.  Negative for blurred vision and double vision.  Respiratory: Negative.  Negative for cough, sputum production, shortness of breath, wheezing and stridor.   Cardiovascular: Negative.   Negative for chest pain, palpitations, orthopnea, claudication, leg swelling and PND.  Gastrointestinal:  Negative for abdominal pain, blood in stool, constipation, diarrhea, heartburn, melena, nausea and vomiting.  Genitourinary: Negative.   Musculoskeletal: Negative.  Negative for falls, joint pain and myalgias.  Skin: Negative.  Negative for rash.  Neurological:  Negative for dizziness, tingling, tremors, sensory change, speech change, focal weakness, seizures, loss of consciousness, weakness and headaches (improved, occasional sinus heachache).  Endo/Heme/Allergies:  Positive for environmental allergies. Negative for polydipsia.  Psychiatric/Behavioral: Negative.  Negative for depression, memory loss, substance abuse and suicidal ideas. The patient is not nervous/anxious and does not have insomnia.   All other systems reviewed and are negative.   Objective:   Today's Vitals   12/22/21 1523  BP: 120/74  Pulse: 90  Temp: (!) 96.5 F (35.8 C)  SpO2: 96%  Weight: 205 lb (93 kg)    General appearance: alert, no distress, WD/WN,  female HEENT: normocephalic, sclerae anicteric, bil ext canals clear, TMs intact without erythema or bulging, nares patent, no discharge or erythema, pharynx normal Oral cavity: MMM, no lesions Neck: supple, no lymphadenopathy, no thyromegaly, no masses Heart: RRR, normal S1, S2, no murmurs Lungs: CTA bilaterally, no wheezes, rhonchi, or rales Abdomen: +bs, soft, non tender, non distended, no masses, no hepatomegaly, no splenomegaly Musculoskeletal: nontender, no swelling, no obvious deformity Extremities: no edema, no cyanosis, no clubbing Pulses: 2+ symmetric, upper and lower extremities, normal cap refill Neurological: alert, oriented x 3, CN2-12 intact, strength normal upper extremities and lower extremities, sensation normal throughout, DTRs 2+ throughout, no cerebellar signs, gait normal Psychiatric: normal affect, behavior normal, pleasant  Breast:  defer Gyn: defer Rectal: defer  Medicare Attestation I have personally reviewed: The patient's medical and social history Their use of alcohol, tobacco or illicit drugs Their current medications and supplements The patient's functional ability including ADLs,fall risks, home safety risks, cognitive, and hearing and visual impairment Diet and physical activities Evidence for depression or mood disorders  The patient's weight, height, BMI, and visual acuity have been recorded in the chart.  I have made referrals, counseling, and provided education to the patient based on review of the above and I have provided the patient with a written personalized care plan for preventive services.     Izora Ribas, NP   12/22/2021

## 2021-12-23 ENCOUNTER — Other Ambulatory Visit: Payer: Self-pay | Admitting: Adult Health

## 2021-12-23 DIAGNOSIS — E039 Hypothyroidism, unspecified: Secondary | ICD-10-CM

## 2021-12-23 MED ORDER — SYNTHROID 200 MCG PO TABS: ORAL_TABLET | ORAL | 1 refills | Status: DC

## 2021-12-30 ENCOUNTER — Other Ambulatory Visit: Payer: Self-pay | Admitting: Internal Medicine

## 2021-12-30 DIAGNOSIS — I10 Essential (primary) hypertension: Secondary | ICD-10-CM

## 2022-01-02 ENCOUNTER — Encounter: Payer: Self-pay | Admitting: Internal Medicine

## 2022-01-09 DIAGNOSIS — R0902 Hypoxemia: Secondary | ICD-10-CM | POA: Diagnosis not present

## 2022-01-09 DIAGNOSIS — G4733 Obstructive sleep apnea (adult) (pediatric): Secondary | ICD-10-CM | POA: Diagnosis not present

## 2022-01-18 ENCOUNTER — Other Ambulatory Visit: Payer: Self-pay

## 2022-01-18 ENCOUNTER — Ambulatory Visit: Payer: Medicare HMO

## 2022-01-18 DIAGNOSIS — E039 Hypothyroidism, unspecified: Secondary | ICD-10-CM

## 2022-01-18 NOTE — Progress Notes (Signed)
Patient here for recheck of TSH. She reports that she is not taking biotin. She also reports that she is waiting to eat or drink one hour after taking her thyroid medication, which she takes in the morning. The patient has no questions and was doing well with her medication.  ?

## 2022-01-19 ENCOUNTER — Encounter: Payer: Self-pay | Admitting: Allergy & Immunology

## 2022-01-19 LAB — TSH: TSH: 0.07 mIU/L — ABNORMAL LOW (ref 0.40–4.50)

## 2022-01-20 ENCOUNTER — Other Ambulatory Visit: Payer: Self-pay | Admitting: *Deleted

## 2022-01-20 MED ORDER — PREDNISONE 10 MG PO TABS: ORAL_TABLET | ORAL | 0 refills | Status: DC

## 2022-01-23 ENCOUNTER — Ambulatory Visit (INDEPENDENT_AMBULATORY_CARE_PROVIDER_SITE_OTHER): Payer: Medicare HMO | Admitting: *Deleted

## 2022-01-23 DIAGNOSIS — J309 Allergic rhinitis, unspecified: Secondary | ICD-10-CM

## 2022-01-24 ENCOUNTER — Other Ambulatory Visit: Payer: Self-pay | Admitting: *Deleted

## 2022-01-24 MED ORDER — AZELASTINE HCL 0.1 % NA SOLN: 1.0000 | Freq: Two times a day (BID) | NASAL | 5 refills | Status: DC

## 2022-01-31 ENCOUNTER — Encounter: Payer: Self-pay | Admitting: Internal Medicine

## 2022-02-09 DIAGNOSIS — G4733 Obstructive sleep apnea (adult) (pediatric): Secondary | ICD-10-CM | POA: Diagnosis not present

## 2022-02-09 DIAGNOSIS — R0902 Hypoxemia: Secondary | ICD-10-CM | POA: Diagnosis not present

## 2022-02-21 ENCOUNTER — Encounter: Payer: Self-pay | Admitting: Allergy & Immunology

## 2022-02-21 ENCOUNTER — Ambulatory Visit (INDEPENDENT_AMBULATORY_CARE_PROVIDER_SITE_OTHER): Payer: Medicare HMO | Admitting: Allergy & Immunology

## 2022-02-21 ENCOUNTER — Ambulatory Visit (INDEPENDENT_AMBULATORY_CARE_PROVIDER_SITE_OTHER): Payer: Medicare HMO

## 2022-02-21 VITALS — BP 124/78 | HR 90 | Temp 97.2°F | Resp 16 | Ht 63.0 in | Wt 207.2 lb

## 2022-02-21 DIAGNOSIS — J3089 Other allergic rhinitis: Secondary | ICD-10-CM

## 2022-02-21 DIAGNOSIS — R519 Headache, unspecified: Secondary | ICD-10-CM

## 2022-02-21 DIAGNOSIS — J302 Other seasonal allergic rhinitis: Secondary | ICD-10-CM

## 2022-02-21 DIAGNOSIS — J309 Allergic rhinitis, unspecified: Secondary | ICD-10-CM

## 2022-02-21 NOTE — Patient Instructions (Addendum)
1. Allergic rhinoconjunctivitis - on allergy shots ?- Continue with allergy shots at the same schedule.  ?- Continue with allergy shots every 4 weeks.  ?- We are going to get a sinus CT to look at your sinuses closer.  ?- We are starting Xhance one spray per nostril twice daily to see if this helps keep the sinuses open.  ? ?2. Return in about 3 months (around 05/23/2022).  ? ?Please inform us of any Emergency Department visits, hospitalizations, or changes in symptoms. Call us before going to the ED for breathing or allergy symptoms since we might be able to fit you in for a sick visit. Feel free to contact us anytime with any questions, problems, or concerns. ? ?It was a pleasure to see you again today! ? ?Websites that have reliable patient information: ?1. American Academy of Asthma, Allergy, and Immunology: www.aaaai.org ?2. Food Allergy Research and Education (FARE): foodallergy.org ?3. Mothers of Asthmatics: http://www.asthmacommunitynetwork.org ?4. [employer] of Allergy, Asthma, and Immunology: MonthlyElectricBill.co.uk ? ? ?COVID-19 Vaccine Information can be found at: ShippingScam.co.uk For questions related to vaccine distribution or appointments, please email [email or web address] or call [phone].  ? ? ? ??Like? [place] on Facebook and Instagram for our latest updates!  ?  ? ? ? ?Make sure you are registered to vote! If you have moved or changed any of your contact information, you will need to get this updated before voting! ? ?In some cases, you MAY be able to register to vote online: [email or web address] ? ? ? ? ?

## 2022-02-21 NOTE — Progress Notes (Signed)
? ?FOLLOW UP ? ?Date of Service/Encounter:  02/21/22 ? ? ?Assessment:  ? ?Seasonal and perennial allergic rhinitis - stable on allergen immunotherapy ? ?New onset frontal headaches - distinct from underlying migraines  ? ?Plan/Recommendations:  ? ?1. Allergic rhinoconjunctivitis - on allergy shots ?- Continue with allergy shots at the same schedule.  ?- Continue with allergy shots every 4 weeks.  ?- We are going to get a sinus CT to look at your sinuses closer.  ?- We are starting Xhance one spray per nostril twice daily to see if this helps keep the sinuses open.  ? ?2. Return in about 3 months (around 05/23/2022).  ? ?Subjective:  ? ?Grace Barry is a 71 y.o. female presenting today for follow up of  ?Chief Complaint  ?Patient presents with  ? Sinusitis  ? ? ?Grace Barry has a history of the following: ?Patient Active Problem List  ? Diagnosis Date Noted  ? Hypoxemia associated with sleep 10/02/2020  ? Leukocytosis 09/27/2020  ? Tremor 07/28/2020  ? Uncontrolled REM sleep behavior disorder 07/20/2020  ? Sleep paralysis, recurrent isolated 07/20/2020  ? Bad dreams 07/20/2020  ? Psychophysiological insomnia 07/20/2020  ? Aortic atherosclerosis ([place]) 12/12/2019  ? Pain 01/17/2019  ? Hyperlipidemia associated with type 2 diabetes mellitus ([place]) 11/07/2018  ? Osteopenia 11/07/2018  ? Seasonal and perennial allergic rhinitis 07/17/2018  ? Vitamin D deficiency disease 05/19/2015  ? Medication management 05/19/2015  ? Migraine 02/19/2015  ? Vertigo 02/19/2015  ? History of colonic polyps 01/15/2015  ? IBS (irritable bowel syndrome) 09/01/2014  ? Morbid obesity ([place]) 07/01/2014  ? CKD stage 2 due to type 2 diabetes mellitus ([place]) 07/01/2014  ? Fatty liver   ? Arthritis   ? Hypothyroidism 12/15/2010  ? T2_NIDDM w/CKD ([place]) 12/15/2010  ? Depression, major, recurrent, in remission ([place]) 12/15/2010  ? Obstructive sleep apnea 12/15/2010  ? Essential hypertension 12/15/2010  ? GERD 12/15/2010  ? ? ?History obtained from:  chart review and patient. ? ?Grace Barry is a 71 y.o. female presenting for a follow up visit.  She was last seen in January 2023.  At that time, we continue with her allergy shots at the same schedule every 4 weeks.  She continue to use all of her medications on a routine basis.  She was endorsing some frontal headaches which we reviewed.  Fairly well controlled with a nasal steroid.  She does not want to be any more aggressive with it. ? ?In the interim, she sent a MyChart message reporting that she wanted to be more aggressive with her headache management.  We sent in some prednisone and recommended that she make a follow-up appointment. ? ?She has headaches that predominantly in the frontal sinus areas and the sphenoid sinuses. These are worse at night. It seems better during the day. This has been going on for a couple of months. Ibuprofen does not do anything. She has not been on antibiotics. She has only been on the prednisone. ? ?She is on the "flooty wooty dooty" (fluticasone, which she could not remember how to pronounce) and azelastine. She will occasionally take loratadine.  The prednisone did help somewhat, but did not resolve it completely.  She has not had a sinus CT in quite some time. ? ?She does have a history of migraines. This is typically in the occipital region and she feels that she is in a vice. She uses baclofen (muscle relaxant) which knocks her out. She is on nortriptyline to prevent this. She  sees Neurology once per year.  Again, these are distinct from her migraines. ? ?Otherwise, there have been no changes to her past medical history, surgical history, family history, or social history. ? ? ? ?Review of Systems  ?Constitutional: Negative.  Negative for fever, malaise/fatigue and weight loss.  ?HENT: Negative.  Negative for congestion, ear discharge and ear pain.   ?Eyes:  Negative for pain, discharge and redness.  ?Respiratory:  Negative for cough, sputum production, shortness of breath and  wheezing.   ?Cardiovascular: Negative.  Negative for chest pain and palpitations.  ?Gastrointestinal:  Negative for abdominal pain, constipation, diarrhea, heartburn, nausea and vomiting.  ?Skin: Negative.  Negative for itching and rash.  ?Neurological:  Positive for headaches. Negative for dizziness.  ?Endo/Heme/Allergies:  Negative for environmental allergies. Does not bruise/bleed easily.   ? ? ? ?Objective:  ? ?Blood pressure 124/78, pulse 90, temperature (!) 97.2 ?F (36.2 ?C), resp. rate 16, height '5\' 3"'$  (1.6 m), weight 207 lb 4 oz (94 kg), SpO2 97 %. ?Body mass index is 36.71 kg/m?. ? ? ? ?Physical Exam ?Vitals reviewed.  ?Constitutional:   ?   Appearance: She is well-developed.  ?HENT:  ?   Head: Normocephalic and atraumatic.  ?   Right Ear: Tympanic membrane, ear canal and external ear normal.  ?   Left Ear: Tympanic membrane and ear canal normal.  ?   Nose: No nasal deformity, septal deviation, mucosal edema or rhinorrhea.  ?   Right Sinus: Frontal sinus tenderness present. No maxillary sinus tenderness.  ?   Left Sinus: Frontal sinus tenderness present. No maxillary sinus tenderness.  ?   Mouth/Throat:  ?   Mouth: Mucous membranes are not pale and not dry.  ?   Pharynx: Uvula midline.  ?Eyes:  ?   General:     ?   Right eye: No discharge.     ?   Left eye: No discharge.  ?   Conjunctiva/sclera: Conjunctivae normal.  ?   Right eye: Right conjunctiva is not injected. No chemosis. ?   Left eye: Left conjunctiva is not injected. No chemosis. ?   Pupils: Pupils are equal, round, and reactive to light.  ?Cardiovascular:  ?   Rate and Rhythm: Normal rate and regular rhythm.  ?   Heart sounds: Normal heart sounds.  ?Pulmonary:  ?   Effort: Pulmonary effort is normal. No tachypnea, accessory muscle usage or respiratory distress.  ?   Breath sounds: Normal breath sounds. No wheezing, rhonchi or rales.  ?Chest:  ?   Chest wall: No tenderness.  ?Lymphadenopathy:  ?   Cervical: No cervical adenopathy.  ?Skin: ?    Coloration: Skin is not pale.  ?   Findings: No abrasion, erythema, petechiae or rash. Rash is not papular, urticarial or vesicular.  ?Neurological:  ?   Mental Status: She is alert.  ?Psychiatric:     ?   Behavior: Behavior is cooperative.  ?  ? ?Diagnostic studies: none ? ? ? ?  ?Salvatore Marvel, MD  ?[facility] ? ? ? ? ? ? ?

## 2022-02-22 ENCOUNTER — Encounter: Payer: Self-pay | Admitting: Allergy & Immunology

## 2022-02-22 MED ORDER — XHANCE 93 MCG/ACT NA EXHU: 1.0000 | INHALANT_SUSPENSION | Freq: Two times a day (BID) | NASAL | 5 refills | Status: DC

## 2022-03-03 ENCOUNTER — Other Ambulatory Visit: Payer: Self-pay | Admitting: Adult Health

## 2022-03-07 DIAGNOSIS — Z1231 Encounter for screening mammogram for malignant neoplasm of breast: Secondary | ICD-10-CM | POA: Diagnosis not present

## 2022-03-07 LAB — HM MAMMOGRAPHY

## 2022-03-08 ENCOUNTER — Telehealth: Payer: Self-pay

## 2022-03-08 NOTE — Telephone Encounter (Signed)
Pa submitted thru cover my meds for xhance waiting on response from [employer] medicare  ?

## 2022-03-09 ENCOUNTER — Encounter: Payer: Self-pay | Admitting: Internal Medicine

## 2022-03-10 ENCOUNTER — Ambulatory Visit (INDEPENDENT_AMBULATORY_CARE_PROVIDER_SITE_OTHER): Payer: Medicare HMO | Admitting: Adult Health

## 2022-03-10 ENCOUNTER — Encounter: Payer: Self-pay | Admitting: Adult Health

## 2022-03-10 VITALS — BP 112/66 | HR 93 | Temp 97.5°F | Wt 207.0 lb

## 2022-03-10 DIAGNOSIS — E039 Hypothyroidism, unspecified: Secondary | ICD-10-CM

## 2022-03-10 DIAGNOSIS — M25472 Effusion, left ankle: Secondary | ICD-10-CM | POA: Diagnosis not present

## 2022-03-10 NOTE — Progress Notes (Signed)
Assessment and Plan: ? ?Grace Barry was seen today for acute visit. ? ?Diagnoses and all orders for this visit: ? ?Hypothyroidism, unspecified type ?continue medications the same pending lab results ?reminded to take on an empty stomach 30-38mns before food.  ?-     TSH ? ?Left ankle swelling ?Subtle localized swelling of ankle; most consistent with limited MSK type etiology ?She denies any discomfort at the site ?Can try compression, elevation, topical antiinflammatories if needed, monitor ?Follow up if not resolving over the next few days/weeks or any new sx ? ?Further disposition pending results of labs.  ?Over 20 minutes of exam, counseling, chart review, and critical decision making was performed.  ? ?Future Appointments  ?Date Time Provider DWye ?03/17/2022  3:40 PM [facility] 1 [facility] [address] [facility]  ?05/11/2022  3:00 PM SDonato Heinz MD [facility]  ?05/22/2022  9:00 AM Pleasant, FEppie Gibson RN [facility] None  ?05/23/2022  4:15 PM GErnst BowlerJGwenith Daily MD [facility] None  ?06/26/2022 10:00 AM Mull, DTownsend Roger NP [facility] None  ?11/23/2022  2:45 PM SSuzzanne Cloud NP [facility] None  ?12/26/2022  3:00 PM CLiane Comber NP [facility] None  ? ? ?------------------------------------------------------------------------------------------------------------------ ? ? ?HPI ?BP 112/66   Pulse 93   Temp (!) 97.5 ?F (36.4 ?C)   Wt 207 lb (93.9 kg)   SpO2 96%   BMI 36.67 kg/m?  ?71y.o.female presents for evaluation of left ankle swelling and thyroid recheck.  ? ?She reports 1-2 weeks of left ankle puffiness, waxing and waning, denies pain in ankle, currently sunburned on legs.  ? ?She is on levothyroxine for hypothyroid; last visit was taking 1 tab daily except 1/2 tab 1 day a week. Since then has reduced to 1/2 tab 3 days a week (MWF) and whole tab all other days. She reports takes with water first thing in the AM,  ?Lab Results  ?Component Value Date  ? TSH 0.07 (L) 01/18/2022  ? ? ? ?Past Medical  History:  ?Diagnosis Date  ? Allergy   ? Anxiety   ? Arthritis   ? Asthma   ? Cataract   ? Chronic kidney disease (CKD), stage II (mild) 07/01/2014  ? Overview:  Overview:  Due to DM, last GFR 64   ? Degenerative tear of triangular fibrocartilage complex (TFCC) of left wrist 01/17/2019  ? Depression   ? Fibromyalgia   ? GERD (gastroesophageal reflux disease)   ? Hyperlipidemia   ? Hypertension   ? Hypoxemia   ? IBS (irritable bowel syndrome)   ? Liver disease   ? Migraine 02/19/2015  ? Migraines   ? Obesity   ? Osteoporosis   ? Sensorineural hearing loss (SNHL) of left ear with unrestricted hearing of right ear 12/31/2017  ? Sleep apnea   ? uses oral device  ? Sleep difficulties   ? Thyroid disease   ? hypothyroidism  ? Tuberculosis   ? positive PPD test, chest x ray negative  ? Type II or unspecified type diabetes mellitus without mention of complication, not stated as uncontrolled   ? Urticaria   ? Ventral hernia   ? Vertigo 02/19/2015  ?  ? ?Allergies  ?Allergen Reactions  ? Floxin [Ofloxacin] Hives  ? Invokana [Canagliflozin]   ?  Hematuria- avoid class  ? Viberzi [Eluxadoline] Hives  ? Doxycycline Rash  ? Penicillins Rash  ? ? ?Current Outpatient Medications on File Prior to Visit  ?Medication Sig  ? aspirin 81 MG tablet Take 81 mg  by mouth daily.  ? azelastine (ASTELIN) 0.1 % nasal spray Place 1 spray into both nostrils 2 (two) times daily. Use in each nostril as directed  ? baclofen (LIORESAL) 10 MG tablet Take 1 tablet (10 mg total) by mouth 2 (two) times daily as needed for muscle spasms.  ? CALCIUM PO Take 1,200 mg by mouth daily.  ? Cyanocobalamin (VITAMIN B12 SL) Place 1 tablet under the tongue daily.  ? DULoxetine (CYMBALTA) 60 MG capsule TAKE 1 CAPSULE DAILY FOR CHRONIC PAIN SYNDROME.  ? EPINEPHrine 0.3 mg/0.3 mL IJ SOAJ injection Inject 0.3 mg into the muscle once.  ? famotidine (PEPCID) 40 MG tablet TAKE 1 TABLET EVERY 12 HOURS TO PREVENT HEARTBURN & INDIGESTION  ? fluticasone (FLONASE) 50 MCG/ACT nasal  spray PLACE 1-2 SPRAYS INTO BOTH NOSTRILS AT BEDTIME.  ? glucose blood (ONE TOUCH ULTRA TEST) test strip Check Blood sugar Daily for diabetes  ? Lancets (ONETOUCH ULTRASOFT) lancets Check Blood sugar  daily  ? loperamide (IMODIUM) 2 MG capsule Take 1 capsule (2 mg total) by mouth as needed for diarrhea or loose stools.  ? losartan (COZAAR) 100 MG tablet TAKE 1 TABLET BY MOUTH DAILY FOR BLOOD PRESSURE AND KIDNEY PROTECTION  ? Magnesium 250 MG TABS Take 250 mg by mouth 2 (two) times daily.  ? metFORMIN (GLUCOPHAGE-XR) 500 MG 24 hr tablet Take  2 tablets 2 x /day with Meals for Diabetes  ? metoprolol succinate (TOPROL-XL) 50 MG 24 hr tablet Take 1 tablet (50 mg total) by mouth daily. Take with or immediately following a meal.  ? Multiple Vitamins-Minerals (MULTIVITAMIN PO) Take 1 tablet by mouth daily.   ? nortriptyline (PAMELOR) 10 MG capsule TAKE 1 CAPSULE BY MOUTH EVERYDAY AT BEDTIME  ? Omega-3 Fatty Acids (FISH OIL PO) Take by mouth daily.  ? omeprazole (PRILOSEC) 20 MG capsule TAKE 1 CAPSULE DAILY TO PREVENT HEARTBURN & ACID REFLUX  ? OZEMPIC, 1 MG/DOSE, 4 MG/3ML SOPN INJECT 1 MG INTO THE SKIN ONCE WEEKLY  ? predniSONE (DELTASONE) 10 MG tablet Take two tablets ('20mg'$ ) twice daily for three days, then one tablet ('10mg'$ ) twice daily for three days, then STOP.  ? PRESCRIPTION MEDICATION Inject as directed every 30 (thirty) days. Allergy Shot  ? rosuvastatin (CRESTOR) 10 MG tablet TAKE 1 TABLET BY MOUTH EVERY DAY FOR CHOLESTEROL  ? SYNTHROID 200 MCG tablet TAKE 1/2 TAB ON SATURDAY, TAKE 1 TAB DAILY ON THE OTHER DAYS. TAKE ON AN EMPTY STOMACH WITH ONLY WATER FOR 30 MINUTES & NO ANTACID MEDS, CALCIUM OR MAGNESIUM FOR 4 HOURS & AVOID BIOTIN  ? Turmeric (QC TUMERIC COMPLEX PO) Take by mouth.  ? Vitamin D, Ergocalciferol, (DRISDOL) 1.25 MG (50000 UNIT) CAPS capsule Take 1 capsule 1 x /week for Vit D Deficiency  ? Fluticasone Propionate (XHANCE) 93 MCG/ACT EXHU Place 1 spray into the nose 2 (two) times daily. (Patient not  taking: Reported on 03/10/2022)  ? ?No current facility-administered medications on file prior to visit.  ? ? ?ROS: all negative except above.  ? ?Physical Exam: ? ?BP 112/66   Pulse 93   Temp (!) 97.5 ?F (36.4 ?C)   Wt 207 lb (93.9 kg)   SpO2 96%   BMI 36.67 kg/m?  ? ?General Appearance: Well nourished, in no apparent distress. ?Eyes: PERRLA, conjunctiva no swelling or erythema ?ENT/Mouth: mask in place; Hearing normal.  ?Neck: Supple  ?Respiratory: Respiratory effort normal ?Cardio: Appears well perfused Brisk peripheral pulses without edema.  ?Lymphatics: Non tender without lymphadenopathy.  ?Musculoskeletal:  no obvious deformity; normal gait. Left lateral ankle with localized subtle puffy swelling, no erythema, heat, tenderness.  ?Skin: Warm, dry without rashes, lesions, ecchymosis.  ?Neuro: Normal muscle tone ?Psych: Awake and oriented X 3, normal affect, Insight and Judgment appropriate.  ? ?Izora Ribas, NP ?10:57 AM ?[facility] ? ?

## 2022-03-11 DIAGNOSIS — R0902 Hypoxemia: Secondary | ICD-10-CM | POA: Diagnosis not present

## 2022-03-11 DIAGNOSIS — G4733 Obstructive sleep apnea (adult) (pediatric): Secondary | ICD-10-CM | POA: Diagnosis not present

## 2022-03-11 LAB — TSH: TSH: 1.95 mIU/L (ref 0.40–4.50)

## 2022-03-15 ENCOUNTER — Telehealth: Payer: Self-pay

## 2022-03-15 DIAGNOSIS — H9042 Sensorineural hearing loss, unilateral, left ear, with unrestricted hearing on the contralateral side: Secondary | ICD-10-CM | POA: Diagnosis not present

## 2022-03-15 NOTE — Telephone Encounter (Signed)
Patient has an appointment on 03/17/22 for a CT maxillofacial without contrast at [facility] imaging. A prior authorization was not completed at the time of scheduling the appointment. In order for her to be seen it must be completed today 03/15/22.  ?

## 2022-03-15 NOTE — Telephone Encounter (Signed)
[facility] called back and stated the numbers on the insurance cards are not the numbers to call to complete pre-certifications. It goes through a 3rd party number connected to both of the patient's insurance companies. [id number]   ? ?[place] [phone]   Fax - [phone] - (case # [id number]) [phone] ?[employer] blue shield - [phone]  Fax-  [phone] - (case# [id number]) ? ?I called both insurance companies and completed the pre-certification forms. I faxed over the most recent OV to both insurance companies, and notified [facility] that I am waiting for both applications to be approved.  ? ?[address] [place] [facility] [phone] ----> Direct scheduling Person   ?Office [phone] ? ? ?

## 2022-03-15 NOTE — Telephone Encounter (Signed)
I called the patient's primary insurance [employer] and they do not require a pre-certification. I faxed over the paperwork from [facility] to [facility] so that the patient can keep their 03/17/22 appointment.  ?

## 2022-03-16 NOTE — Telephone Encounter (Signed)
Thank you, Diandra!   Fidela Cieslak, MD [facility]  

## 2022-03-16 NOTE — Telephone Encounter (Signed)
I called the [facility] center and both pre certifications are pending. They have called the patient and cancelled her appointment for 03/17/22 unfortunately.

## 2022-03-17 ENCOUNTER — Other Ambulatory Visit: Payer: Medicare HMO

## 2022-03-23 ENCOUNTER — Ambulatory Visit (INDEPENDENT_AMBULATORY_CARE_PROVIDER_SITE_OTHER): Payer: Medicare HMO

## 2022-03-23 DIAGNOSIS — J309 Allergic rhinitis, unspecified: Secondary | ICD-10-CM

## 2022-03-24 ENCOUNTER — Ambulatory Visit
Admission: RE | Admit: 2022-03-24 | Discharge: 2022-03-24 | Disposition: A | Discharge status: Home / Self Care | Payer: Medicare HMO | Source: Ambulatory Visit | Attending: Allergy & Immunology | Admitting: Allergy & Immunology

## 2022-03-24 DIAGNOSIS — R519 Headache, unspecified: Secondary | ICD-10-CM

## 2022-03-24 DIAGNOSIS — J329 Chronic sinusitis, unspecified: Secondary | ICD-10-CM | POA: Diagnosis not present

## 2022-03-24 NOTE — Telephone Encounter (Signed)
Received fax that the CT scan has been approved for 03/15/2022-04/13/2022. Left patient a message informing her of this and to Call [facility] to reschedule with what works best for her.

## 2022-03-27 ENCOUNTER — Other Ambulatory Visit: Payer: Self-pay | Admitting: Nurse Practitioner

## 2022-03-27 DIAGNOSIS — K219 Gastro-esophageal reflux disease without esophagitis: Secondary | ICD-10-CM

## 2022-03-28 ENCOUNTER — Encounter (INDEPENDENT_AMBULATORY_CARE_PROVIDER_SITE_OTHER): Payer: Medicare HMO | Admitting: Allergy & Immunology

## 2022-03-28 DIAGNOSIS — J0101 Acute recurrent maxillary sinusitis: Secondary | ICD-10-CM

## 2022-03-29 MED ORDER — CLARITHROMYCIN 500 MG PO TABS: 500.0000 mg | ORAL_TABLET | Freq: Two times a day (BID) | ORAL | 0 refills | Status: AC

## 2022-03-29 NOTE — Telephone Encounter (Signed)

## 2022-04-04 ENCOUNTER — Ambulatory Visit (INDEPENDENT_AMBULATORY_CARE_PROVIDER_SITE_OTHER): Payer: Medicare HMO

## 2022-04-04 DIAGNOSIS — J309 Allergic rhinitis, unspecified: Secondary | ICD-10-CM

## 2022-04-06 NOTE — Telephone Encounter (Signed)
I called the patient to have them reschedule a CT scan with [place] [facility] ( [phone]). I left a message for the patient to call the office back.

## 2022-04-11 DIAGNOSIS — G4733 Obstructive sleep apnea (adult) (pediatric): Secondary | ICD-10-CM | POA: Diagnosis not present

## 2022-04-11 DIAGNOSIS — R0902 Hypoxemia: Secondary | ICD-10-CM | POA: Diagnosis not present

## 2022-04-12 ENCOUNTER — Ambulatory Visit (INDEPENDENT_AMBULATORY_CARE_PROVIDER_SITE_OTHER): Payer: Medicare HMO

## 2022-04-12 DIAGNOSIS — J309 Allergic rhinitis, unspecified: Secondary | ICD-10-CM | POA: Diagnosis not present

## 2022-04-26 ENCOUNTER — Other Ambulatory Visit: Payer: Self-pay | Admitting: *Deleted

## 2022-04-26 NOTE — Patient Outreach (Signed)
[facility]) Care Management  04/26/2022  Kaleena Corrow Carreker 05/27/1951 [id number]   RN Health Coach closed patient's case. A case closed letter sent patient.  [facility] has discontinued the educational six month mailing program.   [facility] [phone]

## 2022-04-27 ENCOUNTER — Other Ambulatory Visit: Payer: Self-pay | Admitting: Nurse Practitioner

## 2022-04-27 ENCOUNTER — Encounter: Payer: Self-pay | Admitting: Adult Health

## 2022-04-27 DIAGNOSIS — I10 Essential (primary) hypertension: Secondary | ICD-10-CM

## 2022-04-27 MED ORDER — METOPROLOL SUCCINATE ER 50 MG PO TB24: 50.0000 mg | ORAL_TABLET | Freq: Every day | ORAL | 3 refills | Status: DC

## 2022-05-07 NOTE — Progress Notes (Signed)
Cardiology Office Note:    Date:  05/07/2022   ID:  INDI WILLHITE, DOB 24-Jul-1951, MRN [id number]  PCP:  Unk Pinto, MD  Cardiologist:  None  Electrophysiologist:  None   Referring MD: Unk Pinto, MD   Chief complaint: chest pain  History of Present Illness:    Grace Barry is a 71 y.o. female with a hx of hypertension, hyperlipidemia, OSA, type 2 diabetes, hypothyroidism who presents for follow-up.  She was referred by Vicie Mutters, PA-C for evaluation of EKG abnormalities and chest pain, initially seen on 06/27/2019.  Patient was seen by PCP with plans to start on phentermine for weight loss and an exercise program.  EKG was checked and showed new T wave inversions.  She was referred to cardiology for further evaluation.  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  TTE on 9 1/20 showed no abnormalities.  She underwent a SPECT on 07/29/2019 to evaluate for ischemia given her fistula, and it showed no evidence of ischemia.  Since last clinic visit,  she reports that she has been doing well.  She denies any chest pain.  Reports dyspnea only with significant exertion.  She denies any lightheadedness, syncope, lower extremity edema, palpitations.  Reports she has not been exercising regularly.  BP Readings from Last 3 Encounters:  03/10/22 112/66  02/21/22 124/78  01/18/22 138/70   Wt Readings from Last 3 Encounters:  03/10/22 207 lb (93.9 kg)  02/21/22 207 lb 4 oz (94 kg)  01/18/22 201 lb 12.8 oz (91.5 kg)      Past Medical History:  Diagnosis Date   Allergy    Anxiety    Arthritis    Asthma    Cataract    Chronic kidney disease (CKD), stage II (mild) 07/01/2014   Overview:  Overview:  Due to DM, last GFR 64    Degenerative tear of triangular fibrocartilage complex (TFCC) of left wrist 01/17/2019   Depression    Fibromyalgia    GERD (gastroesophageal reflux disease)     Hyperlipidemia    Hypertension    Hypoxemia    IBS (irritable bowel syndrome)    Liver disease    Migraine 02/19/2015   Migraines    Obesity    Osteoporosis    Sensorineural hearing loss (SNHL) of left ear with unrestricted hearing of right ear 12/31/2017   Sleep apnea    uses oral device   Sleep difficulties    Thyroid disease    hypothyroidism   Tuberculosis    positive PPD test, chest x ray negative   Type II or unspecified type diabetes mellitus without mention of complication, not stated as uncontrolled    Urticaria    Ventral hernia    Vertigo 02/19/2015    Past Surgical History:  Procedure Laterality Date   ABDOMINAL HYSTERECTOMY     ADENOIDECTOMY     CARPAL TUNNEL RELEASE     bilateral   CATARACT EXTRACTION, BILATERAL     CESAREAN SECTION     x2   CHOLECYSTECTOMY     COLONOSCOPY     HERNIA REPAIR  07/24/11   ventral hernia   JOINT REPLACEMENT     bilateral knees   KNEE ARTHROSCOPY     bilateral   SINOSCOPY     TONSILLECTOMY     TOTAL KNEE ARTHROPLASTY     bilateral    Current Medications: No outpatient  medications have been marked as taking for the 05/11/22 encounter (Appointment) with Donato Heinz, MD.     Allergies:   Floxin [ofloxacin], Invokana [canagliflozin], Viberzi [eluxadoline], Doxycycline, and Penicillins   Social History   Socioeconomic History   Marital status: Married    Spouse name: Francee Piccolo   Number of children: 2   Years of education: masters   Highest education level: Not on file  Occupational History   Occupation: Retired Pharmacist, hospital  Tobacco Use   Smoking status: Never   Smokeless tobacco: Never  Vaping Use   Vaping Use: Never used  Substance and Sexual Activity   Alcohol use: Yes    Alcohol/week: 0.0 standard drinks of alcohol    Comment: occasional   Drug use: No   Sexual activity: Not on file  Other Topics Concern   Not on file  Social History Narrative   Lives with husband, Francee Piccolo   Patient is right handed.    Patient drinks 64 oz of diet soda daily.   Social Determinants of Health   Financial Resource Strain: Not on file  Food Insecurity: Not on file  Transportation Needs: Not on file  Physical Activity: Not on file  Stress: Not on file  Social Connections: Not on file     Family History: The patient'sfamily history includes Breast cancer in her mother; Colon cancer in her son; Esophageal cancer (age of onset: 60) in her son; Hyperlipidemia in her father and sister; Hypertension in her father; Prostate cancer in her father; Stomach cancer in her son. There is no history of Colon polyps, Diabetes, Kidney disease, Migraines, or Rectal cancer.  ROS:   Please see the history of present illness.    All other systems reviewed and are negative.  EKGs/Labs/Other Studies Reviewed:    The following studies were reviewed today:  SPECT 07/29/19: The left ventricular ejection fraction is hyperdynamic (>65%). Nuclear stress EF: 70%. There was no ST segment deviation noted during stress. No T wave inversion was noted during stress. The study is normal. This is a low risk study.   Coronary CTA 07/04/19: 1. Coronary calcium score of 1. This was 50 percentile for age and sex matched control.   2. Normal coronary origin with right dominance.   3. 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 Nonobstructive CAD, with calcified and noncalcified plaques causing minimal (0-24%) stenosis   CAD-RADS 1. Minimal non-obstructive CAD (0-24%). Consider non-atherosclerotic causes of chest pain. Consider preventive therapy and risk factor modification.   TTE 07/01/19:  1. The left ventricle has normal systolic function with an ejection fraction of 60-65%. The cavity size was normal. Left ventricular diastolic parameters were normal.  2. The right ventricle has normal systolic function. The cavity was normal. There is no increase in right ventricular wall thickness.   3. No evidence of mitral valve stenosis.  4. No stenosis of the aortic valve.  5. The aorta is normal unless otherwise noted.  6. The aortic root and ascending aorta are normal in size and structure.  7. The atrial septum is grossly normal.  EKG:  EKG is ordered today.  The ekg ordered today demonstrates normal sinus rhythm, <1 mm ST depressions/TWI in V3-6, I,II  Recent Labs: 12/22/2021: ALT 18; BUN 12; Creat 0.89; Hemoglobin 14.5; Magnesium 1.9; Platelets 316; Potassium 4.4; Sodium 137 03/10/2022: TSH 1.95  Recent Lipid Panel    Component Value Date/Time   CHOL 116 12/22/2021 1616  TRIG 127 12/22/2021 1616   HDL 45 (L) 12/22/2021 1616   CHOLHDL 2.6 12/22/2021 1616   VLDL 35 (H) 03/28/2017 1236   LDLCALC 50 12/22/2021 1616    Physical Exam:    VS:  There were no vitals taken for this visit.    Wt Readings from Last 3 Encounters:  03/10/22 207 lb (93.9 kg)  02/21/22 207 lb 4 oz (94 kg)  01/18/22 201 lb 12.8 oz (91.5 kg)     GEN: Well nourished, well developed in no acute distress HEENT: Normal NECK: No JVD LYMPHATICS: No lymphadenopathy CARDIAC: RRR, no murmurs, rubs, gallops RESPIRATORY:  Clear to auscultation without rales, wheezing or rhonchi  ABDOMEN: Soft, non-tender, non-distended MUSCULOSKELETAL:  No edema; No deformity  SKIN: Warm and dry NEUROLOGIC:  Alert and oriented x 3 PSYCHIATRIC:  Normal affect   ASSESSMENT:    No diagnosis found.  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  Likely an incidental finding and not causing any pathology.  No evidence of ischemia on SPECT.  Normal chamber sizes on TTE.  Chest pain: Coronary CTA showed nonobstructive CAD.  Suspect secondary to GERD, improved with famotidine  DOE: reports dyspnea with minimal exertion.  TTE shows normal systolic function, normal diastolic function, no significant valvular abnormalities.  Suspect  related to deconditioning  Hypertension: On losartan 100 mg daily, Toprol-XL 50 mg daily. Appears well-controlled, continue current meds.  Hyperlipidemia: On rosuvastatin 20 mg daily.  Last LDL 34 on 06/14/2020.  Well-controlled, continue rosuvastatin.  Type 2 diabetes: On Invokana and metformin.  Last A1c 6.5 on 06/10/2019.  Well-controlled  RTC in 1 year  Medication Adjustments/Labs and Tests Ordered: Current medicines are reviewed at length with the patient today.  Concerns regarding medicines are outlined above.  No orders of the defined types were placed in this encounter.  No orders of the defined types were placed in this encounter.   There are no Patient Instructions on file for this visit.   Signed, Donato Heinz, MD  05/07/2022 9:05 PM    [facility]

## 2022-05-10 ENCOUNTER — Other Ambulatory Visit: Payer: Self-pay | Admitting: Adult Health

## 2022-05-11 ENCOUNTER — Ambulatory Visit (INDEPENDENT_AMBULATORY_CARE_PROVIDER_SITE_OTHER): Payer: Medicare HMO | Admitting: Cardiology

## 2022-05-11 ENCOUNTER — Encounter: Payer: Self-pay | Admitting: Cardiology

## 2022-05-11 VITALS — BP 118/66 | HR 99 | Ht 63.0 in | Wt 207.8 lb

## 2022-05-11 DIAGNOSIS — R079 Chest pain, unspecified: Secondary | ICD-10-CM

## 2022-05-11 DIAGNOSIS — I10 Essential (primary) hypertension: Secondary | ICD-10-CM

## 2022-05-11 DIAGNOSIS — G4733 Obstructive sleep apnea (adult) (pediatric): Secondary | ICD-10-CM | POA: Diagnosis not present

## 2022-05-11 DIAGNOSIS — R0609 Other forms of dyspnea: Secondary | ICD-10-CM | POA: Diagnosis not present

## 2022-05-11 DIAGNOSIS — Q245 Malformation of coronary vessels: Secondary | ICD-10-CM

## 2022-05-11 DIAGNOSIS — R0902 Hypoxemia: Secondary | ICD-10-CM | POA: Diagnosis not present

## 2022-05-11 DIAGNOSIS — E785 Hyperlipidemia, unspecified: Secondary | ICD-10-CM

## 2022-05-11 NOTE — Patient Instructions (Signed)
Medication Instructions:  Your physician recommends that you continue on your current medications as directed. Please refer to the Current Medication list given to you today.  *If you need a refill on your cardiac medications before your next appointment, please call your pharmacy*  Follow-Up: At [facility], you and your health needs are our priority.  As part of our continuing mission to provide you with exceptional heart care, we have created designated Provider Care Teams.  These Care Teams include your primary Cardiologist (physician) and Advanced Practice Providers (APPs -  Physician Assistants and Nurse Practitioners) who all work together to provide you with the care you need, when you need it.  We recommend signing up for the patient portal called "MyChart".  Sign up information is provided on this After Visit Summary.  MyChart is used to connect with patients for Virtual Visits (Telemedicine).  Patients are able to view lab/test results, encounter notes, upcoming appointments, etc.  Non-urgent messages can be sent to your provider as well.   To learn more about what you can do with MyChart, go to [email or web address].    Your next appointment:   12 month(s)  The format for your next appointment:   In Person  Provider:   Dr. Gardiner Rhyme  Important Information About Sugar

## 2022-05-12 NOTE — Addendum Note (Signed)
Addended by: Patria Mane A on: 05/12/2022 10:18 AM   Modules accepted: Orders

## 2022-05-16 ENCOUNTER — Encounter: Payer: Self-pay | Admitting: Nurse Practitioner

## 2022-05-17 NOTE — Progress Notes (Signed)
Assessment and Plan:  Diagnoses and all orders for this visit:  Type 2 diabetes mellitus with other diabetic kidney complication, without long-term current use of insulin (HCC) Continue metformin, increase Ozempic to 2 mg SQ QW Continue diet and exercise.  Perform daily foot/skin check, notify office of any concerning changes.  Check A1C  -     CBC with Differential/Platelet -     COMPLETE METABOLIC PANEL WITH GFR -     Semaglutide, 2 MG/DOSE, (OZEMPIC, 2 MG/DOSE,) 8 MG/3ML SOPN; Inject 2 mg into the skin once a week.  CKD stage 2 due to type 2 diabetes mellitus (HCC) Increase fluids, avoid NSAIDS, monitor sugars, will monitor  -     CBC with Differential/Platelet -     COMPLETE METABOLIC PANEL WITH GFR  Vitamin D deficiency disease Continue Vit D supplementation to maintain value in therapeutic level of 60-100  -     VITAMIN D 25 Hydroxy (Vit-D Deficiency, Fractures)  Medication management -     CBC with Differential/Platelet -     COMPLETE METABOLIC PANEL WITH GFR -     TSH -     VITAMIN D 25 Hydroxy (Vit-D Deficiency, Fractures) -     Vitamin B12 -     Iron, Total/Total Iron Binding Cap -     Ferritin -     Semaglutide, 2 MG/DOSE, (OZEMPIC, 2 MG/DOSE,) 8 MG/3ML SOPN; Inject 2 mg into the skin once a week.  Morbid obesity ([place]) Fair life protein shakes Eat more frequently - try not to go more than 6 hours without protein Aim for 90 grams of protein a day- 30 breakfast/30 lunch 30 dinner Try to keep net carbs less than 50 Net Carbs=Total Carbs-fiber- sugar alcohols Exercise heartrate 120-140(fat burning zone)- walking 20-30 minutes 4 days a week -     TSH  Vitamin B 12 deficiency Continue supplementation -     Vitamin B12  Chronic fatigue Continue to work on diet, exercise and weight loss Continue O2 at night -     Iron, Total/Total Iron Binding Cap -     Ferritin       Further disposition pending results of labs. Discussed med's effects and SE's.   Over 30  minutes of exam, counseling, chart review, and critical decision making was performed.   Future Appointments  Date Time Provider [place]  05/18/2022 11:15 AM Alycia Rossetti, NP [facility] None  05/22/2022  9:00 AM Pleasant, Eppie Gibson, RN [facility] None  05/23/2022  4:15 PM Valentina Shaggy, MD [facility] None  06/26/2022 10:00 AM Alycia Rossetti, NP [facility] None  11/23/2022  2:45 PM Suzzanne Cloud, NP [facility] None  12/26/2022  3:00 PM Darrol Jump, NP [facility] None    ------------------------------------------------------------------------------------------------------------------   HPI BP 120/68   Pulse 85   Temp (!) 97.5 F (36.4 C)   Ht '5\' 3"'$  (1.6 m)   Wt 212 lb (96.2 kg)   SpO2 94%   BMI 37.55 kg/m   71 y.o.female presents for persistent fatigue. For the past 6 weeks fatigue has been worse.  Was evaluated by cardiology on 05/11/22 by Dr. Gardiner Rhyme. All tests were normal and was advised to continue current medications. She awakens feeling fatigued.  She has trouble falling asleep but does get 7-9 hours of sleep. Has not been told she snores.  Has excessive daytime somnolence.  She had a sleep study 18 months ago that showed no sleep apnea. She does sleep with 2 L [place]  O2 at night due to shallow breathing which is ordered through pulmonology.  BMI is Body mass index is 37.55 kg/m., she has been working on diet and exercise. Walking 3 days a week for 15 minutes. She skips breakfast, eats a bowl of cereal or toast at lunch and then meat and vegetables at dinner Wt Readings from Last 3 Encounters:  05/18/22 212 lb (96.2 kg)  05/11/22 207 lb 12.8 oz (94.3 kg)  03/10/22 207 lb (93.9 kg)    She is currently on Ozempic 1 mg SQ QW without side effects and Metformin 500 mg 2 tab bid.  Last A1c was: Lab Results  Component Value Date   HGBA1C 6.0 (H) 12/22/2021     Past Medical History:  Diagnosis Date   Allergy    Anxiety    Arthritis    Asthma    Cataract     Chronic kidney disease (CKD), stage II (mild) 07/01/2014   Overview:  Overview:  Due to DM, last GFR 64    Degenerative tear of triangular fibrocartilage complex (TFCC) of left wrist 01/17/2019   Depression    Fibromyalgia    GERD (gastroesophageal reflux disease)    Hyperlipidemia    Hypertension    Hypoxemia    IBS (irritable bowel syndrome)    Liver disease    Migraine 02/19/2015   Migraines    Obesity    Osteoporosis    Sensorineural hearing loss (SNHL) of left ear with unrestricted hearing of right ear 12/31/2017   Sleep apnea    uses oral device   Sleep difficulties    Thyroid disease    hypothyroidism   Tuberculosis    positive PPD test, chest x ray negative   Type II or unspecified type diabetes mellitus without mention of complication, not stated as uncontrolled    Urticaria    Ventral hernia    Vertigo 02/19/2015     Allergies  Allergen Reactions   Floxin [Ofloxacin] Hives   Invokana [Canagliflozin]     Hematuria- avoid class   Viberzi [Eluxadoline] Hives   Doxycycline Rash   Penicillins Rash    Current Outpatient Medications on File Prior to Visit  Medication Sig   aspirin 81 MG tablet Take 81 mg by mouth daily.   azelastine (ASTELIN) 0.1 % nasal spray Place 1 spray into both nostrils 2 (two) times daily. Use in each nostril as directed   baclofen (LIORESAL) 10 MG tablet Take 1 tablet (10 mg total) by mouth 2 (two) times daily as needed for muscle spasms.   CALCIUM PO Take 1,200 mg by mouth daily.   Cyanocobalamin (VITAMIN B12 SL) Place 1 tablet under the tongue daily.   DULoxetine (CYMBALTA) 60 MG capsule TAKE 1 CAPSULE DAILY FOR CHRONIC PAIN SYNDROME.   EPINEPHrine 0.3 mg/0.3 mL IJ SOAJ injection Inject 0.3 mg into the muscle once.   famotidine (PEPCID) 40 MG tablet TAKE 1 TABLET EVERY 12 HOURS TO PREVENT HEARTBURN & INDIGESTION   fluticasone (FLONASE) 50 MCG/ACT nasal spray PLACE 1-2 SPRAYS INTO BOTH NOSTRILS AT BEDTIME.   Fluticasone Propionate (XHANCE) 93  MCG/ACT EXHU Place 1 spray into the nose 2 (two) times daily.   glucose blood (ONE TOUCH ULTRA TEST) test strip Check Blood sugar Daily for diabetes   Lancets (ONETOUCH ULTRASOFT) lancets Check Blood sugar  daily   loperamide (IMODIUM) 2 MG capsule Take 1 capsule (2 mg total) by mouth as needed for diarrhea or loose stools.   losartan (COZAAR) 100 MG tablet TAKE  1 TABLET BY MOUTH DAILY FOR BLOOD PRESSURE AND KIDNEY PROTECTION   Magnesium 250 MG TABS Take 250 mg by mouth 2 (two) times daily.   metFORMIN (GLUCOPHAGE-XR) 500 MG 24 hr tablet Take  2 tablets 2 x /day with Meals for Diabetes   metoprolol succinate (TOPROL-XL) 50 MG 24 hr tablet Take 1 tablet (50 mg total) by mouth daily. Take with or immediately following a meal.   Multiple Vitamins-Minerals (MULTIVITAMIN PO) Take 1 tablet by mouth daily.    nortriptyline (PAMELOR) 10 MG capsule TAKE 1 CAPSULE BY MOUTH EVERYDAY AT BEDTIME   Omega-3 Fatty Acids (FISH OIL PO) Take by mouth daily.   omeprazole (PRILOSEC) 20 MG capsule TAKE 1 CAPSULE DAILY TO PREVENT HEARTBURN & ACID REFLUX   OZEMPIC, 1 MG/DOSE, 4 MG/3ML SOPN INJECT 1 MG INTO THE SKIN ONCE WEEKLY   PRESCRIPTION MEDICATION Inject as directed every 30 (thirty) days. Allergy Shot   rosuvastatin (CRESTOR) 10 MG tablet TAKE 1 TABLET BY MOUTH EVERY DAY FOR CHOLESTEROL   SHINGRIX injection    SYNTHROID 200 MCG tablet TAKE 1/2 TAB ON SATURDAY, TAKE 1 TAB DAILY ON THE OTHER DAYS. TAKE ON AN EMPTY STOMACH WITH ONLY WATER FOR 30 MINUTES & NO ANTACID MEDS, CALCIUM OR MAGNESIUM FOR 4 HOURS & AVOID BIOTIN   Turmeric (QC TUMERIC COMPLEX PO) Take by mouth.   Vitamin D, Ergocalciferol, (DRISDOL) 1.25 MG (50000 UNIT) CAPS capsule Take 1 capsule 1 x /week for Vit D Deficiency   No current facility-administered medications on file prior to visit.    ROS: all negative except above.   Physical Exam:  BP 120/68   Pulse 85   Temp (!) 97.5 F (36.4 C)   Ht '5\' 3"'$  (1.6 m)   Wt 212 lb (96.2 kg)   SpO2  94%   BMI 37.55 kg/m   General Appearance: Well nourished, in no apparent distress. Eyes: PERRLA, EOMs, conjunctiva no swelling or erythema Sinuses: No Frontal/maxillary tenderness ENT/Mouth: Ext aud canals clear, TMs without erythema, bulging. No erythema, swelling, or exudate on post pharynx.  Tonsils not swollen or erythematous. Hearing normal.  Neck: Supple, thyroid normal.  Respiratory: Respiratory effort normal, BS equal bilaterally without rales, rhonchi, wheezing or stridor.  Cardio: RRR with no MRGs. Brisk peripheral pulses without edema.  Abdomen: Soft, + BS.  Non tender, no guarding, rebound, hernias, masses. Lymphatics: Non tender without lymphadenopathy.  Musculoskeletal: Full ROM, 5/5 strength, normal gait.  Skin: Warm, dry without rashes, lesions, ecchymosis.  Neuro: Cranial nerves intact. Normal muscle tone, no cerebellar symptoms. Sensation intact.  Psych: Awake and oriented X 3, normal affect, Insight and Judgment appropriate.     Alycia Rossetti, NP 11:12 AM [facility]

## 2022-05-18 ENCOUNTER — Ambulatory Visit (INDEPENDENT_AMBULATORY_CARE_PROVIDER_SITE_OTHER): Payer: Medicare HMO | Admitting: Nurse Practitioner

## 2022-05-18 ENCOUNTER — Ambulatory Visit (INDEPENDENT_AMBULATORY_CARE_PROVIDER_SITE_OTHER): Payer: Medicare HMO | Admitting: *Deleted

## 2022-05-18 ENCOUNTER — Encounter: Payer: Self-pay | Admitting: Nurse Practitioner

## 2022-05-18 VITALS — BP 120/68 | HR 85 | Temp 97.5°F | Ht 63.0 in | Wt 212.0 lb

## 2022-05-18 DIAGNOSIS — E1122 Type 2 diabetes mellitus with diabetic chronic kidney disease: Secondary | ICD-10-CM

## 2022-05-18 DIAGNOSIS — E538 Deficiency of other specified B group vitamins: Secondary | ICD-10-CM | POA: Diagnosis not present

## 2022-05-18 DIAGNOSIS — R5382 Chronic fatigue, unspecified: Secondary | ICD-10-CM | POA: Diagnosis not present

## 2022-05-18 DIAGNOSIS — J309 Allergic rhinitis, unspecified: Secondary | ICD-10-CM

## 2022-05-18 DIAGNOSIS — Z79899 Other long term (current) drug therapy: Secondary | ICD-10-CM | POA: Diagnosis not present

## 2022-05-18 DIAGNOSIS — N182 Chronic kidney disease, stage 2 (mild): Secondary | ICD-10-CM

## 2022-05-18 DIAGNOSIS — E559 Vitamin D deficiency, unspecified: Secondary | ICD-10-CM

## 2022-05-18 DIAGNOSIS — E1129 Type 2 diabetes mellitus with other diabetic kidney complication: Secondary | ICD-10-CM | POA: Diagnosis not present

## 2022-05-18 MED ORDER — OZEMPIC (2 MG/DOSE) 8 MG/3ML ~~LOC~~ SOPN: 2.0000 mg | PEN_INJECTOR | SUBCUTANEOUS | 3 refills | Status: DC

## 2022-05-18 NOTE — Patient Instructions (Addendum)
Fair life protein shakes Eat more frequently - try not to go more than 6 hours without protein Aim for 90 grams of protein a day- 30 breakfast/30 lunch 30 dinner Try to keep net carbs less than 50 Net Carbs=Total Carbs-fiber- sugar alcohols Exercise heartrate 120-140(fat burning zone)- walking 20-30 minutes 4 days a week   Fatigue If you have fatigue, you feel tired all the time and have a lack of energy or a lack of motivation. Fatigue may make it difficult to start or complete tasks because of exhaustion. Occasional or mild fatigue is often a normal response to activity or life. However, long-term (chronic) or extreme fatigue may be a symptom of a medical condition such as: Depression. Not having enough red blood cells or hemoglobin in the blood (anemia). A problem with a small gland located in the lower front part of the neck (thyroid disorder). Rheumatologic conditions. These are problems related to the body's defense system (immune system). Infections, especially certain viral infections. Fatigue can also lead to negative health outcomes over time. Follow these instructions at home: Medicines Take over-the-counter and prescription medicines only as told by your health care provider. Take a multivitamin if told by your health care provider. Do not use herbal or dietary supplements unless they are approved by your health care provider. Eating and drinking  Avoid heavy meals in the evening. Eat a well-balanced diet, which includes lean proteins, whole grains, plenty of fruits and vegetables, and low-fat dairy products. Avoid eating or drinking too many products with caffeine in them. Avoid alcohol. Drink enough fluid to keep your urine pale yellow. Activity  Exercise regularly, as told by your health care provider. Use or practice techniques to help you relax, such as yoga, tai chi, meditation, or massage therapy. Lifestyle Change situations that cause you stress. Try to keep your  work and personal schedules in balance. Do not use recreational or illegal drugs. General instructions Monitor your fatigue for any changes. Go to bed and get up at the same time every day. Avoid fatigue by pacing yourself during the day and getting enough sleep at night. Maintain a healthy weight. Contact a health care provider if: Your fatigue does not get better. You have a fever. You suddenly lose or gain weight. You have headaches. You have trouble falling asleep or sleeping through the night. You feel angry, guilty, anxious, or sad. You have swelling in your legs or another part of your body. Get help right away if: You feel confused, feel like you might faint, or faint. Your vision is blurry or you have a severe headache. You have severe pain in your abdomen, your back, or the area between your waist and hips (pelvis). You have chest pain, shortness of breath, or an irregular or fast heartbeat. You are unable to urinate, or you urinate less than normal. You have abnormal bleeding from the rectum, nose, lungs, nipples, or, if you are female, the vagina. You vomit blood. You have thoughts about hurting yourself or others. These symptoms may be an emergency. Get help right away. Call 911. Do not wait to see if the symptoms will go away. Do not drive yourself to the hospital. Get help right away if you feel like you may hurt yourself or others, or have thoughts about taking your own life. Go to your nearest emergency room or: Call 911. Call the [place] at [phone] or 988. This is open 24 hours a day. Text the Crisis Text Line at  128786. Summary If you have fatigue, you feel tired all the time and have a lack of energy or a lack of motivation. Fatigue may make it difficult to start or complete tasks because of exhaustion. Long-term (chronic) or extreme fatigue may be a symptom of a medical condition. Exercise regularly, as told by your health  care provider. Change situations that cause you stress. Try to keep your work and personal schedules in balance. This information is not intended to replace advice given to you by your health care provider. Make sure you discuss any questions you have with your health care provider. Document Revised: 08/08/2021 Document Reviewed: 08/08/2021 Elsevier Patient Education  Fellows.

## 2022-05-19 ENCOUNTER — Other Ambulatory Visit: Payer: Self-pay | Admitting: Nurse Practitioner

## 2022-05-19 DIAGNOSIS — E039 Hypothyroidism, unspecified: Secondary | ICD-10-CM

## 2022-05-19 LAB — CBC WITH DIFFERENTIAL/PLATELET
Absolute Monocytes: 619 cells/uL (ref 200–950)
Basophils Absolute: 71 cells/uL (ref 0–200)
Basophils Relative: 0.6 %
Eosinophils Absolute: 1809 cells/uL — ABNORMAL HIGH (ref 15–500)
Eosinophils Relative: 15.2 %
HCT: 42 % (ref 35.0–45.0)
Hemoglobin: 13.9 g/dL (ref 11.7–15.5)
Lymphs Abs: 3558 cells/uL (ref 850–3900)
MCH: 29.1 pg (ref 27.0–33.0)
MCHC: 33.1 g/dL (ref 32.0–36.0)
MCV: 88.1 fL (ref 80.0–100.0)
MPV: 10.7 fL (ref 7.5–12.5)
Monocytes Relative: 5.2 %
Neutro Abs: 5843 cells/uL (ref 1500–7800)
Neutrophils Relative %: 49.1 %
Platelets: 322 10*3/uL (ref 140–400)
RBC: 4.77 10*6/uL (ref 3.80–5.10)
RDW: 13.1 % (ref 11.0–15.0)
Total Lymphocyte: 29.9 %
WBC: 11.9 10*3/uL — ABNORMAL HIGH (ref 3.8–10.8)

## 2022-05-19 LAB — FERRITIN: Ferritin: 17 ng/mL (ref 16–288)

## 2022-05-19 LAB — COMPLETE METABOLIC PANEL WITH GFR
AG Ratio: 1.7 (calc) (ref 1.0–2.5)
ALT: 22 U/L (ref 6–29)
AST: 21 U/L (ref 10–35)
Albumin: 4 g/dL (ref 3.6–5.1)
Alkaline phosphatase (APISO): 73 U/L (ref 37–153)
BUN: 7 mg/dL (ref 7–25)
CO2: 27 mmol/L (ref 20–32)
Calcium: 9.6 mg/dL (ref 8.6–10.4)
Chloride: 101 mmol/L (ref 98–110)
Creat: 0.95 mg/dL (ref 0.60–1.00)
Globulin: 2.3 g/dL (calc) (ref 1.9–3.7)
Glucose, Bld: 111 mg/dL — ABNORMAL HIGH (ref 65–99)
Potassium: 4.3 mmol/L (ref 3.5–5.3)
Sodium: 140 mmol/L (ref 135–146)
Total Bilirubin: 0.4 mg/dL (ref 0.2–1.2)
Total Protein: 6.3 g/dL (ref 6.1–8.1)
eGFR: 64 mL/min/{1.73_m2} (ref >=60)

## 2022-05-19 LAB — IRON, TOTAL/TOTAL IRON BINDING CAP
%SAT: 18 % (calc) (ref 16–45)
Iron: 68 ug/dL (ref 45–160)
TIBC: 382 mcg/dL (calc) (ref 250–450)

## 2022-05-19 LAB — VITAMIN D 25 HYDROXY (VIT D DEFICIENCY, FRACTURES): Vit D, 25-Hydroxy: 93 ng/mL (ref 30–100)

## 2022-05-19 LAB — VITAMIN B12: Vitamin B-12: 396 pg/mL (ref 200–1100)

## 2022-05-19 LAB — TSH: TSH: 7.4 mIU/L — ABNORMAL HIGH (ref 0.40–4.50)

## 2022-05-22 ENCOUNTER — Ambulatory Visit: Payer: Self-pay | Admitting: *Deleted

## 2022-05-23 ENCOUNTER — Encounter: Payer: Self-pay | Admitting: Allergy & Immunology

## 2022-05-23 ENCOUNTER — Ambulatory Visit (INDEPENDENT_AMBULATORY_CARE_PROVIDER_SITE_OTHER): Payer: Medicare HMO | Admitting: Allergy & Immunology

## 2022-05-23 VITALS — BP 138/70 | HR 102 | Temp 96.5°F | Resp 12 | Wt 210.4 lb

## 2022-05-23 DIAGNOSIS — R519 Headache, unspecified: Secondary | ICD-10-CM | POA: Diagnosis not present

## 2022-05-23 DIAGNOSIS — J302 Other seasonal allergic rhinitis: Secondary | ICD-10-CM

## 2022-05-23 DIAGNOSIS — J3089 Other allergic rhinitis: Secondary | ICD-10-CM | POA: Diagnosis not present

## 2022-05-23 MED ORDER — AZELASTINE HCL 0.1 % NA SOLN
1.0000 | Freq: Two times a day (BID) | NASAL | 5 refills | Status: DC
Start: 2022-05-23 — End: 2024-02-11

## 2022-05-23 MED ORDER — MONTELUKAST SODIUM 10 MG PO TABS: 10.0000 mg | ORAL_TABLET | Freq: Every day | ORAL | 5 refills | Status: DC

## 2022-05-23 MED ORDER — FLUTICASONE PROPIONATE 50 MCG/ACT NA SUSP: 1.0000 | Freq: Every day | NASAL | 3 refills | Status: DC

## 2022-05-23 NOTE — Progress Notes (Signed)
FOLLOW UP  Date of Service/Encounter:  05/23/22   Assessment:   Seasonal and perennial allergic rhinitis - stable on allergen immunotherapy (remixing pollen containing vial to n   New onset frontal headaches - resolved with antibiotics  Plan/Recommendations:   1. Allergic rhinoconjunctivitis - on allergy shots - Continue with allergy shots at the same schedule.  - Continue with allergy shots every 4 weeks.  - We are going to remix your shots for your next set of vials.  - We are going to focus on the pollens since this seems to be your main season.   - Continue with fluticasone and azelastine twice daily since this seems to be working. - Start Singulair (montelukast) '10mg'$  daily (this can cause irritability and bad dreams, so beware of that).   2. Return in about 6 months (around 11/23/2022).   Subjective:   Grace Barry is a 71 y.o. female presenting today for follow up of  Chief Complaint  Patient presents with   Asthma    None existent per patient   Allergic Rhinitis     Been pretty good.    Grace Barry has a history of the following: Patient Active Problem List   Diagnosis Date Noted   Hypoxemia associated with sleep 10/02/2020   Leukocytosis 09/27/2020   Tremor 07/28/2020   Uncontrolled REM sleep behavior disorder 07/20/2020   Sleep paralysis, recurrent isolated 07/20/2020   Bad dreams 07/20/2020   Psychophysiological insomnia 07/20/2020   Aortic atherosclerosis ([place]) 12/12/2019   Pain 01/17/2019   Hyperlipidemia associated with type 2 diabetes mellitus ([place]) 11/07/2018   Osteopenia 11/07/2018   Seasonal and perennial allergic rhinitis 07/17/2018   Vitamin D deficiency disease 05/19/2015   Medication management 05/19/2015   Migraine 02/19/2015   Vertigo 02/19/2015   History of colonic polyps 01/15/2015   IBS (irritable bowel syndrome) 09/01/2014   Morbid obesity ([place]) 07/01/2014   CKD stage 2 due to type 2 diabetes mellitus ([place]) 07/01/2014   Fatty  liver    Arthritis    Hypothyroidism 12/15/2010   T2_NIDDM w/CKD ([place]) 12/15/2010   Depression, major, recurrent, in remission ([place]) 12/15/2010   Obstructive sleep apnea 12/15/2010   Essential hypertension 12/15/2010   GERD 12/15/2010    History obtained from: chart review and patient.  Grace Barry is a 71 y.o. female presenting for a follow up visit.  She was last seen in April 2023.  At that time, she was endorsing headaches.  We continue with allergy shots at the same schedule.  We did order a chest CT to look at her sinuses and started Xhance 1 spray per nostril twice daily.  Since last visit, she has done well. She remained on the antibiotics for three weeks and this cleared up the headache. She was visting her son in [place] and she was fine. But then they came back here her symptoms got worse again. Spring is usually the worse time of the year. Last headache was months ago. She largely does fine during the winter months without any symptoms at all.   Allergic Rhinitis Symptom History: She did not feel better when she was getting her shots more frequently.  She has had dogs forever and they never seemed to bother her. She is not using her Truett Perna because it was going to be $500. She instead sticks with her fluticasone and azelastine.   Otherwise, there have been no changes to her past medical history, surgical history, family history, or social history.  Review of Systems  Constitutional: Negative.  Negative for chills, fever, malaise/fatigue and weight loss.  HENT: Negative.  Negative for congestion, ear discharge, ear pain and sinus pain.   Eyes:  Negative for pain, discharge and redness.  Respiratory:  Negative for cough, sputum production, shortness of breath and wheezing.   Cardiovascular: Negative.  Negative for chest pain and palpitations.  Gastrointestinal:  Negative for abdominal pain, constipation, diarrhea, heartburn, nausea and vomiting.  Skin: Negative.  Negative for  itching and rash.  Neurological:  Negative for dizziness and headaches.  Endo/Heme/Allergies:  Negative for environmental allergies. Does not bruise/bleed easily.       Objective:   Blood pressure 138/70, pulse (!) 102, temperature (!) 96.5 F (35.8 C), temperature source Temporal, resp. rate 12, weight 210 lb 6.4 oz (95.4 kg), SpO2 95 %. Body mass index is 37.27 kg/m.    Physical Exam Vitals reviewed.  Constitutional:      Appearance: She is well-developed.  HENT:     Head: Normocephalic and atraumatic.     Right Ear: Tympanic membrane, ear canal and external ear normal.     Left Ear: Tympanic membrane, ear canal and external ear normal.     Nose: No nasal deformity, septal deviation, mucosal edema or rhinorrhea.     Right Turbinates: Enlarged and swollen.     Left Turbinates: Enlarged and swollen.     Right Sinus: No maxillary sinus tenderness or frontal sinus tenderness.     Left Sinus: No maxillary sinus tenderness or frontal sinus tenderness.     Mouth/Throat:     Mouth: Mucous membranes are not pale and not dry.     Pharynx: Uvula midline.  Eyes:     General:        Right eye: No discharge.        Left eye: No discharge.     Conjunctiva/sclera: Conjunctivae normal.     Right eye: Right conjunctiva is not injected. No chemosis.    Left eye: Left conjunctiva is not injected. No chemosis.    Pupils: Pupils are equal, round, and reactive to light.  Cardiovascular:     Rate and Rhythm: Normal rate and regular rhythm.     Heart sounds: Normal heart sounds.  Pulmonary:     Effort: Pulmonary effort is normal. No tachypnea, accessory muscle usage or respiratory distress.     Breath sounds: Normal breath sounds. No wheezing, rhonchi or rales.  Chest:     Chest wall: No tenderness.  Lymphadenopathy:     Cervical: No cervical adenopathy.  Skin:    Coloration: Skin is not pale.     Findings: No abrasion, erythema, petechiae or rash. Rash is not papular, urticarial or  vesicular.  Neurological:     Mental Status: She is alert.  Psychiatric:        Behavior: Behavior is cooperative.      Diagnostic studies:    Spirometry: results normal (FEV1: 1.67/81%, FVC: 2.18/82%, FEV1/FVC: 77%).    Spirometry consistent with normal pattern.   Allergy Studies: none     Salvatore Marvel, MD  [facility]

## 2022-05-23 NOTE — Patient Instructions (Addendum)
1. Allergic rhinoconjunctivitis - on allergy shots - Continue with allergy shots at the same schedule.  - Continue with allergy shots every 4 weeks.  - We are going to remix your shots for your next set of vials.  - We are going to focus on the pollens since this seems to be your main season.   - Continue with fluticasone and azelastine twice daily since this seems to be working. - Start Singulair (montelukast) '10mg'$  daily (this can cause irritability and bad dreams, so beware of that).   2. Return in about 6 months (around 11/23/2022).   Please inform us of any Emergency Department visits, hospitalizations, or changes in symptoms. Call us before going to the ED for breathing or allergy symptoms since we might be able to fit you in for a sick visit. Feel free to contact us anytime with any questions, problems, or concerns.  It was a pleasure to see you again today!  Websites that have reliable patient information: 1. American Academy of Asthma, Allergy, and Immunology: www.aaaai.org 2. Food Allergy Research and Education (FARE): foodallergy.org 3. Mothers of Asthmatics: http://www.asthmacommunitynetwork.org 4. American College of Allergy, Asthma, and Immunology: www.acaai.org   COVID-19 Vaccine Information can be found at: ShippingScam.co.uk For questions related to vaccine distribution or appointments, please email [email or web address] or call [phone].     "Like" [place] on Facebook and Instagram for our latest updates!       Make sure you are registered to vote! If you have moved or changed any of your contact information, you will need to get this updated before voting!  In some cases, you MAY be able to register to vote online: [email or web address]

## 2022-05-24 NOTE — Progress Notes (Signed)
Aeroallergen Immunotherapy   Ordering Provider: Dr. Salvatore Marvel   Patient Details  Name: Grace Barry  MRN: [id number]  Date of Birth: Dec 20, 1950   Order 2 of 2   Vial Label: G/W/T   0.3 ml (Volume)  BAU Concentration -- 7 Grass Mix* 100,000 ([address] [place], [place], [place], [place], Red[place], Sweet Vernal, Timothy)  0.2 ml (Volume)  1:20 Concentration -- [place]  0.5 ml (Volume)  1:20 Concentration -- Ragweed Mix  0.3 ml (Volume)  1:20 Concentration -- Lamb's Quarters*  0.3 ml (Volume)  1:10 Concentration -- Sheep Sorrell*  0.2 ml (Volume)  1:20 Concentration -- Rough Pigweed*  0.2 ml (Volume)  1:20 Concentration -- Marsh elder, Rough*  0.2 ml (Volume)  1:20 Concentration -- Mugwort, Common*  0.4 ml (Volume)  1:10 Concentration -- Wendee Copp mix*  0.4 ml (Volume)  1:10 Concentration -- Oak, [place] mix*    3.0  ml Extract Subtotal  2.0  ml Diluent  5.0  ml Maintenance Total    Final Concentration above is stated in weight/volume (wt/vol).  Allergen units (AU/ml) biological units (BAU/ml).  The total volume is 5 ml.     Schedule:  C   Red Vial (1:100): Schedule A (10 doses)   Special Instructions: Schedule A for the first vial and then revert back to Schedule C for subsequent vials.

## 2022-05-24 NOTE — Progress Notes (Signed)
MAKE WHEN NEEDED.

## 2022-05-25 ENCOUNTER — Other Ambulatory Visit: Payer: Self-pay | Admitting: Nurse Practitioner

## 2022-05-25 DIAGNOSIS — E1129 Type 2 diabetes mellitus with other diabetic kidney complication: Secondary | ICD-10-CM

## 2022-05-25 MED ORDER — SEMAGLUTIDE (1 MG/DOSE) 4 MG/3ML ~~LOC~~ SOPN: 1.0000 mg | PEN_INJECTOR | SUBCUTANEOUS | 3 refills | Status: DC

## 2022-06-11 DIAGNOSIS — R0902 Hypoxemia: Secondary | ICD-10-CM | POA: Diagnosis not present

## 2022-06-11 DIAGNOSIS — G4733 Obstructive sleep apnea (adult) (pediatric): Secondary | ICD-10-CM | POA: Diagnosis not present

## 2022-06-14 NOTE — Telephone Encounter (Signed)
Closing encounter

## 2022-06-21 ENCOUNTER — Ambulatory Visit (INDEPENDENT_AMBULATORY_CARE_PROVIDER_SITE_OTHER): Payer: Medicare HMO

## 2022-06-21 DIAGNOSIS — J309 Allergic rhinitis, unspecified: Secondary | ICD-10-CM

## 2022-06-22 NOTE — Progress Notes (Signed)
CPE AND FOLLOW UP  Assessment:   Encounter for general adult medical examination with abnormal findings 1 year  Type 2 diabetes mellitus with other diabetic kidney complication, without long-term current use of insulin (HCC) -     Hemoglobin A1c Discussed general issues about diabetes pathophysiology and management., Educational material distributed., Suggested low cholesterol diet., Encouraged aerobic exercise., Discussed foot care., Reminded to get yearly retinal exam.  CKD stage 2 due to type 2 diabetes mellitus ([place]) -     Hemoglobin A1c - U/A , microalbumin/creatinine ratio  Increase fluids, avoid NSAIDS, monitor sugars, will monitor  Hyperlipidemia associated with type 2 diabetes mellitus (HCC) -     Lipid panel check lipids decrease fatty foods increase activity.   Depression, major, recurrent, in remission ([place]) -     DULoxetine (CYMBALTA) 30 MG capsule; Take 1 capsule (30 mg total) by mouth daily. Contributing to fatigue- has been on cymbalta in the past and did well- will try to stop paxil and switch to cymbalta.   Insomnia Begin Trazodone 50 mg 1/2 -1 tab at bedtime Practice good sleep hygiene  Fatigue, unspecified type Neuro study showed no sleep apnea, does use nasal cannula O2 at night due to hypoxia Check labs Treat depression Close folllow up -     EKG 12-Lead       Atherosclerosis of aorta Per CT 07/2019 Control blood pressure, cholesterol, glucose, increase exercise.  AAA   Essential hypertension - continue medications, DASH diet, exercise and monitor at home. Call if greater than 130/80.  - CBC with Differential/Platelet - CMP/GFR - TSH   Obstructive sleep apnea Last sleep study showed no longer has sleep apnea but uses O2 2L nasal cannula  Asthma, unspecified asthma severity, uncomplicated controlled  Gastroesophageal reflux disease without esophagitis Continue PPI/H2 blocker, diet discussed Still has symptoms more related to stress  Other  specified hypothyroidism Hypothyroidism-check TSH level, continue medications the same, reminded to take on an empty stomach 30-44mns before food.   Medication management - Magnesium  Arthritis RICE, NSAIDS PRN, ortho referral if needed  Diarrhea IBS, controlled   History of colonic polyps Colonoscopy due 2024  Morbid obesity ([place] - increase veggies, decrease carbs - long discussion about weight loss, diet, and exercise - patient admits to emotional eating; strategies discussed, get chocolate out of the house, get husband on board, have plan for when cravings hit, try protein heavy snacks, information provided -Continue Ozempic '1mg'$  q week  Hypothyroidism continue medications the same pending lab results reminded to take on an empty stomach 30-652ms before food.  check TSH level  Vitamin D Deficiency - VIT D  Liver disease/ fatty liver disease Check labs, avoid tylenol, alcohol, weight loss advised.   Screening for Ischemic heart disease EKG  Over 30 minutes of exam, counseling, chart review, and critical decision making was performed  Future Appointments  Date Time Provider [place]1/25/2024  2:45 PM SlSuzzanne CloudNP [facility] None  11/28/2022  3:00 PM GaValentina ShaggyMD [facility] None  12/26/2022  3:00 PM CrDarrol JumpNP [facility] None  06/27/2023 10:00 AM WiAlycia RossettiNP [facility] None      Subjective:   Grace Barry is a [age].o. female who presents for CPE and 3 month follow up on hypertension, T2 diabetes with CKD, hyperlipidemia, vitamin D def.   [place] , will be [place] 3 [place].   She has  followed with Dr. Toy Cookey . She states she does not feel like she has good sleep. She will still wake up tired. Sleep study december no apnea, using 2L O2 through nasal cannula for dropping O2 with sleep. She continues to have increased fatigue and does not sleep well.  Lab Results   Component Value Date   IRON 68 05/18/2022   TIBC 382 05/18/2022   FERRITIN 17 05/18/2022   Lab Results  Component Value Date   VITAMINB12 396 05/18/2022    She has history of migraines, she has been following with Dr. Jannifer Franklin. She is using Noritriptyline 10 mg QHS. Zanaflex when she gets a tension headache- not helping as much as Baclofen.  She is on Cymbalta for body aches and depression.   BMI is Body mass index is 35.89 kg/m., she has not been working on diet and exercise. She eats 1-2 meals a day. She emotionally and bored eat. She drink 120 oz of water a day, with crystal lite very diluted. She has not been working out since pandemic and knee pain, plans to restart water aerobics once able.  She is currently on Ozempic for DM and has lost  10 pounds in the past month.  Wt Readings from Last 3 Encounters:  06/26/22 202 lb 9.6 oz (91.9 kg)  05/23/22 210 lb 6.4 oz (95.4 kg)  05/18/22 212 lb (96.2 kg)   She had CT coronary 07/2019 -CAD-RADS 1. Minimal non-obstructive CAD (0-24%) but did show aortic atherosclerosis Her blood pressure has been controlled at home,  Metoprolol '50mg'$  XL for palpitations, losartan  100 mg,BP: 118/74  BP Readings from Last 3 Encounters:  06/26/22 118/74  05/23/22 138/70  05/18/22 120/68    She does workout. She denies chest pain, shortness of breath, dizziness.   She is on cholesterol medication, crestor '10mg'$  daily and denies myalgias. Her cholesterol is at goal. The cholesterol was:   Lab Results  Component Value Date   CHOL 116 12/22/2021   HDL 45 (L) 12/22/2021   LDLCALC 50 12/22/2021   TRIG 127 12/22/2021   CHOLHDL 2.6 12/22/2021   She has been working on diet and exercise for Diabetes  with CKD III on ACE/ARB With hyperlipidemia  Metformin '500mg'$  2 tabs BID ozempic 1.0 mg once a week- tolerating well she is on bASA Denies paresthesia of the feet, polydipsia, polyuria and visual disturbances. [facility] [place] [facility]- scheduled Sept 2021 Not  checking sugars.  Last A1C was:  Lab Results  Component Value Date   HGBA1C 6.0 (H) 12/22/2021    She has CKD IIIa associated with T2DM monitored at this office:  Lab Results  Component Value Date   [facility] 82 12/22/2020   Patient is currently on Vitamin D supplement 50000 units once a week and was a little elevated at recent check:  Lab Results  Component Value Date   VD25OH 93 05/18/2022   She is on thyroid medication, 267mg 1 tab daily, she is not on biotin. Normal thyroid [place]. Her medication was not changed last visit,.  Lab Results  Component Value Date   TSH 7.40 (H) 05/18/2022   Mammogram 03/07/22- negative repeat in 1 year Colonoscopy 01/01/20- sessile polyps repeat 2024  Medication Review  Current Outpatient Medications (Endocrine & Metabolic):    metFORMIN (GLUCOPHAGE-XR) 500 MG 24 hr tablet, Take  2 tablets 2 x /day with Meals for Diabetes   Semaglutide, 1 MG/DOSE, 4 MG/3ML SOPN, Inject 1 mg into the skin once a week.  SYNTHROID 200 MCG tablet, TAKE 1/2 TAB ON SATURDAY, TAKE 1 TAB DAILY ON THE OTHER DAYS. TAKE ON AN EMPTY STOMACH WITH ONLY WATER FOR 30 MINUTES & NO ANTACID MEDS, CALCIUM OR MAGNESIUM FOR 4 HOURS & AVOID BIOTIN  Current Outpatient Medications (Cardiovascular):    EPINEPHrine 0.3 mg/0.3 mL IJ SOAJ injection, Inject 0.3 mg into the muscle once.   losartan (COZAAR) 100 MG tablet, TAKE 1 TABLET BY MOUTH DAILY FOR BLOOD PRESSURE AND KIDNEY PROTECTION   metoprolol succinate (TOPROL-XL) 50 MG 24 hr tablet, Take 1 tablet (50 mg total) by mouth daily. Take with or immediately following a meal.   rosuvastatin (CRESTOR) 10 MG tablet, TAKE 1 TABLET BY MOUTH EVERY DAY FOR CHOLESTEROL  Current Outpatient Medications (Respiratory):    azelastine (ASTELIN) 0.1 % nasal spray, Place 1 spray into both nostrils 2 (two) times daily. Use in each nostril as directed   fluticasone (FLONASE) 50 MCG/ACT nasal spray, Place 1-2 sprays into both nostrils at bedtime.    montelukast (SINGULAIR) 10 MG tablet, Take 1 tablet (10 mg total) by mouth at bedtime.  Current Outpatient Medications (Analgesics):    aspirin 81 MG tablet, Take 81 mg by mouth daily.  Current Outpatient Medications (Hematological):    Cyanocobalamin (VITAMIN B12 SL), Place 1 tablet under the tongue daily.  Current Outpatient Medications (Other):    baclofen (LIORESAL) 10 MG tablet, Take 1 tablet (10 mg total) by mouth 2 (two) times daily as needed for muscle spasms.   CALCIUM PO, Take 1,200 mg by mouth daily.   DULoxetine (CYMBALTA) 60 MG capsule, TAKE 1 CAPSULE DAILY FOR CHRONIC PAIN SYNDROME.   famotidine (PEPCID) 40 MG tablet, TAKE 1 TABLET EVERY 12 HOURS TO PREVENT HEARTBURN & INDIGESTION   loperamide (IMODIUM) 2 MG capsule, Take 1 capsule (2 mg total) by mouth as needed for diarrhea or loose stools.   Magnesium 250 MG TABS, Take 250 mg by mouth 2 (two) times daily.   Multiple Vitamins-Minerals (MULTIVITAMIN PO), Take 1 tablet by mouth daily.    nortriptyline (PAMELOR) 10 MG capsule, TAKE 1 CAPSULE BY MOUTH EVERYDAY AT BEDTIME   Omega-3 Fatty Acids (FISH OIL PO), Take by mouth daily.   omeprazole (PRILOSEC) 20 MG capsule, TAKE 1 CAPSULE DAILY TO PREVENT HEARTBURN & ACID REFLUX   PRESCRIPTION MEDICATION, Inject as directed every 30 (thirty) days. Allergy Shot   Turmeric (QC TUMERIC COMPLEX PO), Take by mouth.   Vitamin D, Ergocalciferol, (DRISDOL) 1.25 MG (50000 UNIT) CAPS capsule, Take 1 capsule 1 x /week for Vit D Deficiency   glucose blood (ONE TOUCH ULTRA TEST) test strip, Check Blood sugar Daily for diabetes   Lancets (ONETOUCH ULTRASOFT) lancets, Check Blood sugar  daily   SHINGRIX injection,   Current Problems (verified) Patient Active Problem List   Diagnosis Date Noted   Hypoxemia associated with sleep 10/02/2020   Leukocytosis 09/27/2020   Tremor 07/28/2020   Uncontrolled REM sleep behavior disorder 07/20/2020   Sleep paralysis, recurrent isolated 07/20/2020   Bad  dreams 07/20/2020   Psychophysiological insomnia 07/20/2020   Aortic atherosclerosis ([place]) 12/12/2019   Pain 01/17/2019   Hyperlipidemia associated with type 2 diabetes mellitus ([place]) 11/07/2018   Osteopenia 11/07/2018   Seasonal and perennial allergic rhinitis 07/17/2018   Vitamin D deficiency disease 05/19/2015   Medication management 05/19/2015   Migraine 02/19/2015   Vertigo 02/19/2015   History of colonic polyps 01/15/2015   IBS (irritable bowel syndrome) 09/01/2014   Morbid obesity ([place]) 07/01/2014   CKD stage  2 due to type 2 diabetes mellitus ([place]) 07/01/2014   Fatty liver    Arthritis    Hypothyroidism 12/15/2010   T2_NIDDM w/CKD ([place]) 12/15/2010   Depression, major, recurrent, in remission ([place]) 12/15/2010   Obstructive sleep apnea 12/15/2010   Essential hypertension 12/15/2010   GERD 12/15/2010    Screening Tests Immunization History  Administered Date(s) Administered   Influenza, High Dose Seasonal PF 08/10/2015, 08/10/2016, 08/28/2017, 11/07/2018, 08/07/2019, 09/21/2020, 07/25/2021   Influenza-Unspecified 08/05/2019   PFIZER(Purple Top)SARS-COV-2 Vaccination 11/19/2019, 12/10/2019, 08/05/2020   Pneumococcal Conjugate-13 07/01/2014   Pneumococcal Polysaccharide-23 10/09/2002, 05/26/2008, 12/13/2016   Td 10/09/2002, 10/30/2010   Tdap 08/10/2015   Zoster Recombinat (Shingrix) 07/25/2021   Zoster, Live 06/20/2011    Preventative care: Tetanus: 2016 Pneumovax: 2018 Prevnar 13: 2015 Flu vaccine: 07/2019 Zostavax: 2012  Pap: 2009 remote declines another MGM: 03/07/22 DEXA:4 /26/22- osteopenia CXR 2020 Colonoscopy: 12/2019 Dr. Peggye Ley return 3 years EGD: 10/2014 + gastritis MRI 11/20/2017 PFT 06/2018  CT coronary 07/2019 -CAD-RADS 1. Minimal non-obstructive CAD (0-24%).  aortic atherosclerosis  Names of Other Physician/Practitioners you currently use: 1.  [facility]- here for primary care 2. Dr. Wynetta Emery, [place], eye  doctor, last visit 2022, patient has insurance again, plans to schedule  3. Dr. Betsey Holiday, dentist, last visit q 6 months 4. Dr. Domingo Cocking, neuro  5. Dr. Toy Cookey, sleep apnea  Patient Care Team: Unk Pinto, MD as PCP - General (Internal Medicine) Jola Baptist, DC as Anesthesiologist (Chiropractic Medicine)   Allergies Allergies  Allergen Reactions   Floxin [Ofloxacin] Hives   Invokana [Canagliflozin]     Hematuria- avoid class   Viberzi [Eluxadoline] Hives   Doxycycline Rash   Penicillins Rash    SURGICAL HISTORY She  has a past surgical history that includes Total knee arthroplasty; Cholecystectomy; Abdominal hysterectomy; Cesarean section; Tonsillectomy; Knee arthroscopy; Carpal tunnel release; Hernia repair (07/24/11); Joint replacement; Adenoidectomy; Sinoscopy; Cataract extraction, bilateral; and Colonoscopy. FAMILY HISTORY Her family history includes Breast cancer in her mother; Colon cancer in her son; Esophageal cancer (age of onset: 86) in her son; Hyperlipidemia in her father and sister; Hypertension in her father; Prostate cancer in her father; Stomach cancer in her son. SOCIAL HISTORY She  reports that she has never smoked. She has never used smokeless tobacco. She reports current alcohol use. She reports that she does not use drugs.  Review of Systems:  Review of Systems  Constitutional: Negative.  Negative for chills and fever.  HENT:  Positive for sinus pain and tinnitus. Negative for congestion, ear discharge, ear pain, hearing loss, nosebleeds and sore throat.   Eyes: Negative.  Negative for blurred vision and double vision.  Respiratory: Negative.  Negative for cough, hemoptysis, sputum production, shortness of breath, wheezing and stridor.   Cardiovascular: Negative.  Negative for chest pain, palpitations and leg swelling.  Gastrointestinal:  Positive for diarrhea (irregular) and heartburn. Negative for abdominal pain, blood in stool, constipation, melena,  nausea and vomiting.  Genitourinary: Negative.  Negative for dysuria and urgency.  Musculoskeletal:  Positive for joint pain. Negative for back pain, falls, myalgias and neck pain.  Skin: Negative.  Negative for rash.  Neurological:  Positive for headaches. Negative for dizziness, tingling, tremors, sensory change, speech change, focal weakness, seizures, loss of consciousness and weakness.  Endo/Heme/Allergies: Negative.  Does not bruise/bleed easily.  Psychiatric/Behavioral:  Positive for depression. Negative for substance abuse and suicidal ideas. The patient has insomnia. The patient is not nervous/anxious.      Objective:  Today's Vitals   06/26/22 1005  BP: 118/74  Pulse: 93  Temp: 97.6 F (36.4 C)  SpO2: 95%  Weight: 202 lb 9.6 oz (91.9 kg)  Height: '5\' 3"'$  (1.6 m)   General appearance: alert, no distress, WD/WN,  female HEENT: normocephalic, sclerae anicteric, TMs normal, no erythema, burlging.  nares patent, no discharge or erythema, pharynx normal Oral cavity: MMM, no lesions Neck: supple, no lymphadenopathy, no thyromegaly Heart: RRR, normal S1, S2, no murmurs Lungs: CTA bilaterally, no wheezes, rhonchi, or rales Abdomen: +bs, soft, non tender, non distended, no masses, no hepatomegaly, no splenomegaly Musculoskeletal: nontender, no swelling, no obvious deformity Extremities: no edema, no cyanosis, no clubbing Pulses: 2+ symmetric, upper and lower extremities, normal cap refill Neurological: alert, oriented x 3, CN2-12 intact, strength normal upper extremities and lower extremities, sensation normal throughout, DTRs 2+ throughout, no cerebellar signs, gait normal Psychiatric: normal affect, behavior normal, pleasant  EKG: deferred to cardiologist Breast/Pelvic: defer to GYN  [facility]  06/26/2022

## 2022-06-26 ENCOUNTER — Encounter: Payer: Self-pay | Admitting: Nurse Practitioner

## 2022-06-26 ENCOUNTER — Ambulatory Visit (INDEPENDENT_AMBULATORY_CARE_PROVIDER_SITE_OTHER): Payer: Medicare HMO | Admitting: Nurse Practitioner

## 2022-06-26 VITALS — BP 118/74 | HR 93 | Temp 97.6°F | Ht 63.0 in | Wt 202.6 lb

## 2022-06-26 DIAGNOSIS — E1129 Type 2 diabetes mellitus with other diabetic kidney complication: Secondary | ICD-10-CM | POA: Diagnosis not present

## 2022-06-26 DIAGNOSIS — R5382 Chronic fatigue, unspecified: Secondary | ICD-10-CM

## 2022-06-26 DIAGNOSIS — M8580 Other specified disorders of bone density and structure, unspecified site: Secondary | ICD-10-CM

## 2022-06-26 DIAGNOSIS — F3340 Major depressive disorder, recurrent, in remission, unspecified: Secondary | ICD-10-CM

## 2022-06-26 DIAGNOSIS — K589 Irritable bowel syndrome without diarrhea: Secondary | ICD-10-CM

## 2022-06-26 DIAGNOSIS — Z1389 Encounter for screening for other disorder: Secondary | ICD-10-CM

## 2022-06-26 DIAGNOSIS — E039 Hypothyroidism, unspecified: Secondary | ICD-10-CM | POA: Diagnosis not present

## 2022-06-26 DIAGNOSIS — E559 Vitamin D deficiency, unspecified: Secondary | ICD-10-CM | POA: Diagnosis not present

## 2022-06-26 DIAGNOSIS — Z0000 Encounter for general adult medical examination without abnormal findings: Secondary | ICD-10-CM

## 2022-06-26 DIAGNOSIS — M1990 Unspecified osteoarthritis, unspecified site: Secondary | ICD-10-CM

## 2022-06-26 DIAGNOSIS — I10 Essential (primary) hypertension: Secondary | ICD-10-CM

## 2022-06-26 DIAGNOSIS — G4733 Obstructive sleep apnea (adult) (pediatric): Secondary | ICD-10-CM

## 2022-06-26 DIAGNOSIS — Z860100 Personal history of colon polyps, unspecified: Secondary | ICD-10-CM

## 2022-06-26 DIAGNOSIS — E1122 Type 2 diabetes mellitus with diabetic chronic kidney disease: Secondary | ICD-10-CM

## 2022-06-26 DIAGNOSIS — I700 Atherosclerosis of aorta: Secondary | ICD-10-CM

## 2022-06-26 DIAGNOSIS — E785 Hyperlipidemia, unspecified: Secondary | ICD-10-CM | POA: Diagnosis not present

## 2022-06-26 DIAGNOSIS — E1169 Type 2 diabetes mellitus with other specified complication: Secondary | ICD-10-CM | POA: Diagnosis not present

## 2022-06-26 DIAGNOSIS — Z79899 Other long term (current) drug therapy: Secondary | ICD-10-CM | POA: Diagnosis not present

## 2022-06-26 DIAGNOSIS — K760 Fatty (change of) liver, not elsewhere classified: Secondary | ICD-10-CM

## 2022-06-26 DIAGNOSIS — K219 Gastro-esophageal reflux disease without esophagitis: Secondary | ICD-10-CM | POA: Diagnosis not present

## 2022-06-26 DIAGNOSIS — N182 Chronic kidney disease, stage 2 (mild): Secondary | ICD-10-CM | POA: Diagnosis not present

## 2022-06-26 DIAGNOSIS — Z0001 Encounter for general adult medical examination with abnormal findings: Secondary | ICD-10-CM

## 2022-06-26 DIAGNOSIS — Z136 Encounter for screening for cardiovascular disorders: Secondary | ICD-10-CM

## 2022-06-26 DIAGNOSIS — G4700 Insomnia, unspecified: Secondary | ICD-10-CM

## 2022-06-26 MED ORDER — TRAZODONE HCL 50 MG PO TABS: ORAL_TABLET | ORAL | 2 refills | Status: DC

## 2022-06-27 LAB — URINALYSIS, ROUTINE W REFLEX MICROSCOPIC
Bacteria, UA: NONE SEEN /HPF
Bilirubin Urine: NEGATIVE
Glucose, UA: NEGATIVE
Hgb urine dipstick: NEGATIVE
Hyaline Cast: NONE SEEN /LPF
Ketones, ur: NEGATIVE
Nitrite: NEGATIVE
Protein, ur: NEGATIVE
Specific Gravity, Urine: 1.012 (ref 1.001–1.035)
Squamous Epithelial / HPF: NONE SEEN /HPF (ref <=5)
pH: 6.5 (ref 5.0–8.0)

## 2022-06-27 LAB — LIPID PANEL
Cholesterol: 105 mg/dL (ref <200)
HDL: 39 mg/dL — ABNORMAL LOW (ref >=50)
LDL Cholesterol (Calc): 45 mg/dL (calc)
Non-HDL Cholesterol (Calc): 66 mg/dL (calc) (ref <130)
Total CHOL/HDL Ratio: 2.7 (calc) (ref <5.0)
Triglycerides: 127 mg/dL (ref <150)

## 2022-06-27 LAB — CBC WITH DIFFERENTIAL/PLATELET
Absolute Monocytes: 677 cells/uL (ref 200–950)
Basophils Absolute: 47 cells/uL (ref 0–200)
Basophils Relative: 0.5 %
Eosinophils Absolute: 367 cells/uL (ref 15–500)
Eosinophils Relative: 3.9 %
HCT: 41.1 % (ref 35.0–45.0)
Hemoglobin: 13.6 g/dL (ref 11.7–15.5)
Lymphs Abs: 3121 cells/uL (ref 850–3900)
MCH: 29.3 pg (ref 27.0–33.0)
MCHC: 33.1 g/dL (ref 32.0–36.0)
MCV: 88.6 fL (ref 80.0–100.0)
MPV: 10.6 fL (ref 7.5–12.5)
Monocytes Relative: 7.2 %
Neutro Abs: 5189 cells/uL (ref 1500–7800)
Neutrophils Relative %: 55.2 %
Platelets: 315 10*3/uL (ref 140–400)
RBC: 4.64 10*6/uL (ref 3.80–5.10)
RDW: 13 % (ref 11.0–15.0)
Total Lymphocyte: 33.2 %
WBC: 9.4 10*3/uL (ref 3.8–10.8)

## 2022-06-27 LAB — HEMOGLOBIN A1C
Hgb A1c MFr Bld: 5.9 % of total Hgb — ABNORMAL HIGH (ref <5.7)
Mean Plasma Glucose: 123 mg/dL
eAG (mmol/L): 6.8 mmol/L

## 2022-06-27 LAB — MAGNESIUM: Magnesium: 1.9 mg/dL (ref 1.5–2.5)

## 2022-06-27 LAB — MICROALBUMIN / CREATININE URINE RATIO
Creatinine, Urine: 101 mg/dL (ref 20–275)
Microalb Creat Ratio: 14 mcg/mg creat (ref <30)
Microalb, Ur: 1.4 mg/dL

## 2022-06-27 LAB — COMPLETE METABOLIC PANEL WITH GFR
AG Ratio: 1.9 (calc) (ref 1.0–2.5)
ALT: 23 U/L (ref 6–29)
AST: 19 U/L (ref 10–35)
Albumin: 4.3 g/dL (ref 3.6–5.1)
Alkaline phosphatase (APISO): 67 U/L (ref 37–153)
BUN: 10 mg/dL (ref 7–25)
CO2: 27 mmol/L (ref 20–32)
Calcium: 10 mg/dL (ref 8.6–10.4)
Chloride: 100 mmol/L (ref 98–110)
Creat: 0.87 mg/dL (ref 0.60–1.00)
Globulin: 2.3 g/dL (calc) (ref 1.9–3.7)
Glucose, Bld: 107 mg/dL — ABNORMAL HIGH (ref 65–99)
Potassium: 4.2 mmol/L (ref 3.5–5.3)
Sodium: 139 mmol/L (ref 135–146)
Total Bilirubin: 0.5 mg/dL (ref 0.2–1.2)
Total Protein: 6.6 g/dL (ref 6.1–8.1)
eGFR: 71 mL/min/{1.73_m2} (ref >=60)

## 2022-06-27 LAB — MICROSCOPIC MESSAGE

## 2022-06-27 LAB — VITAMIN D 25 HYDROXY (VIT D DEFICIENCY, FRACTURES): Vit D, 25-Hydroxy: 110 ng/mL — ABNORMAL HIGH (ref 30–100)

## 2022-06-27 LAB — TSH: TSH: 0.06 mIU/L — ABNORMAL LOW (ref 0.40–4.50)

## 2022-07-04 ENCOUNTER — Encounter: Payer: Self-pay | Admitting: Nurse Practitioner

## 2022-07-12 ENCOUNTER — Ambulatory Visit (INDEPENDENT_AMBULATORY_CARE_PROVIDER_SITE_OTHER): Payer: Medicare HMO | Admitting: *Deleted

## 2022-07-12 DIAGNOSIS — J309 Allergic rhinitis, unspecified: Secondary | ICD-10-CM | POA: Diagnosis not present

## 2022-07-12 DIAGNOSIS — R0902 Hypoxemia: Secondary | ICD-10-CM | POA: Diagnosis not present

## 2022-07-12 DIAGNOSIS — G4733 Obstructive sleep apnea (adult) (pediatric): Secondary | ICD-10-CM | POA: Diagnosis not present

## 2022-07-28 ENCOUNTER — Other Ambulatory Visit: Payer: Self-pay | Admitting: Nurse Practitioner

## 2022-07-28 ENCOUNTER — Other Ambulatory Visit: Payer: Self-pay | Admitting: Internal Medicine

## 2022-07-28 DIAGNOSIS — G4700 Insomnia, unspecified: Secondary | ICD-10-CM

## 2022-08-11 DIAGNOSIS — G4733 Obstructive sleep apnea (adult) (pediatric): Secondary | ICD-10-CM | POA: Diagnosis not present

## 2022-08-11 DIAGNOSIS — R0902 Hypoxemia: Secondary | ICD-10-CM | POA: Diagnosis not present

## 2022-08-18 ENCOUNTER — Encounter: Payer: Self-pay | Admitting: Nurse Practitioner

## 2022-08-23 ENCOUNTER — Encounter: Payer: Self-pay | Admitting: Internal Medicine

## 2022-08-23 ENCOUNTER — Other Ambulatory Visit: Payer: Self-pay | Admitting: Internal Medicine

## 2022-08-23 DIAGNOSIS — E1122 Type 2 diabetes mellitus with diabetic chronic kidney disease: Secondary | ICD-10-CM

## 2022-08-23 MED ORDER — METFORMIN HCL ER 500 MG PO TB24: ORAL_TABLET | ORAL | 3 refills | Status: DC

## 2022-09-06 ENCOUNTER — Ambulatory Visit (INDEPENDENT_AMBULATORY_CARE_PROVIDER_SITE_OTHER): Payer: Medicare HMO | Admitting: *Deleted

## 2022-09-06 DIAGNOSIS — J309 Allergic rhinitis, unspecified: Secondary | ICD-10-CM

## 2022-09-11 DIAGNOSIS — G4733 Obstructive sleep apnea (adult) (pediatric): Secondary | ICD-10-CM | POA: Diagnosis not present

## 2022-09-11 DIAGNOSIS — R0902 Hypoxemia: Secondary | ICD-10-CM | POA: Diagnosis not present

## 2022-09-15 ENCOUNTER — Ambulatory Visit (INDEPENDENT_AMBULATORY_CARE_PROVIDER_SITE_OTHER): Payer: Medicare HMO

## 2022-09-15 DIAGNOSIS — J309 Allergic rhinitis, unspecified: Secondary | ICD-10-CM

## 2022-09-17 ENCOUNTER — Other Ambulatory Visit: Payer: Self-pay | Admitting: Nurse Practitioner

## 2022-09-17 DIAGNOSIS — K219 Gastro-esophageal reflux disease without esophagitis: Secondary | ICD-10-CM

## 2022-09-20 ENCOUNTER — Ambulatory Visit (INDEPENDENT_AMBULATORY_CARE_PROVIDER_SITE_OTHER): Payer: Medicare HMO | Admitting: *Deleted

## 2022-09-20 DIAGNOSIS — J309 Allergic rhinitis, unspecified: Secondary | ICD-10-CM | POA: Diagnosis not present

## 2022-09-26 ENCOUNTER — Encounter: Payer: Self-pay | Admitting: Internal Medicine

## 2022-09-26 ENCOUNTER — Ambulatory Visit (INDEPENDENT_AMBULATORY_CARE_PROVIDER_SITE_OTHER): Payer: Medicare HMO | Admitting: Internal Medicine

## 2022-09-26 VITALS — BP 122/70 | HR 90 | Temp 97.7°F | Ht 63.0 in | Wt 204.8 lb

## 2022-09-26 DIAGNOSIS — E039 Hypothyroidism, unspecified: Secondary | ICD-10-CM

## 2022-09-26 DIAGNOSIS — E1169 Type 2 diabetes mellitus with other specified complication: Secondary | ICD-10-CM

## 2022-09-26 DIAGNOSIS — E1122 Type 2 diabetes mellitus with diabetic chronic kidney disease: Secondary | ICD-10-CM | POA: Diagnosis not present

## 2022-09-26 DIAGNOSIS — N182 Chronic kidney disease, stage 2 (mild): Secondary | ICD-10-CM

## 2022-09-26 DIAGNOSIS — E559 Vitamin D deficiency, unspecified: Secondary | ICD-10-CM | POA: Diagnosis not present

## 2022-09-26 DIAGNOSIS — E785 Hyperlipidemia, unspecified: Secondary | ICD-10-CM | POA: Diagnosis not present

## 2022-09-26 DIAGNOSIS — Z79899 Other long term (current) drug therapy: Secondary | ICD-10-CM

## 2022-09-26 DIAGNOSIS — I10 Essential (primary) hypertension: Secondary | ICD-10-CM | POA: Diagnosis not present

## 2022-09-26 DIAGNOSIS — I700 Atherosclerosis of aorta: Secondary | ICD-10-CM | POA: Diagnosis not present

## 2022-09-26 DIAGNOSIS — Z23 Encounter for immunization: Secondary | ICD-10-CM

## 2022-09-26 MED ORDER — TIRZEPATIDE 10 MG/0.5ML ~~LOC~~ SOAJ
10.0000 mg | SUBCUTANEOUS | 0 refills | Status: DC
  Filled 2022-09-26: qty 2, 28d supply, fill #0

## 2022-09-26 NOTE — Patient Instructions (Signed)
Due to recent changes in healthcare laws, you may see the results of your imaging and laboratory studies on MyChart before your provider has had a [place] to review them.  We understand that in some cases there may be results that are confusing or concerning to you. Not all laboratory results come back in the same time frame and the provider may be waiting for multiple results in order to interpret others.  Please give us 48 hours in order for your provider to thoroughly review all the results before contacting the office for clarification of your results.  ++++++++++++++++++++++++++  Vit D  & Vit C 1,000 mg   are recommended to help protect  against the Covid-19 and other Corona viruses.    Also it's recommended  to take  Zinc 50 mg  to help  protect against the Covid-19   and best place to get  is also on [employer].com  and don't pay more than 6-8 cents /pill !   +++++++++++++++++++++++++++++++++++++++ Recommend Adult Low Dose Aspirin or  coated  Aspirin 81 mg daily  To reduce risk of Colon Cancer 40 %,  Skin Cancer 26 % ,  Melanoma 46%  and  Pancreatic cancer 60% +++++++++++++++++++++++++++++++++++++++++ Vitamin D goal  is between 70-100.  Please make sure that you are taking your Vitamin D as directed.  It is very important as a natural anti-inflammatory  helping hair, skin, and nails, as well as reducing stroke and heart attack risk.  It helps your bones and helps with mood. It also decreases numerous cancer risks so please take it as directed.  Low Vit D is associated with a 200-300% higher risk for CANCER  and 200-300% higher risk for HEART   ATTACK  &  STROKE.   ...................................... It is also associated with higher death rate at younger ages,  autoimmune diseases like Rheumatoid arthritis, Lupus, Multiple Sclerosis.    Also many other serious conditions, like depression, Alzheimer's Dementia, infertility, muscle aches, fatigue, fibromyalgia - just to name  a few. +++++++++++++++++++++++++++++++++++++++++ Recommend the book "The END of DIETING" by Dr Joel Fuhrman  & the book "The END of DIABETES " by Dr Joel Fuhrman At [employer].com - get book & Audio CD's    Being diabetic has a  300% increased risk for heart attack, stroke, cancer, and alzheimer- type vascular dementia. It is very important that you work harder with diet by avoiding all foods that are white. Avoid white rice (brown & wild rice is OK), white potatoes (sweetpotatoes in moderation is OK), White bread or wheat bread or anything made out of white flour like bagels, donuts, rolls, buns, biscuits, cakes, pastries, cookies, pizza crust, and pasta (made from white flour & egg whites) - vegetarian pasta or spinach or wheat pasta is OK. Multigrain breads like Arnold's or Pepperidge Farm, or multigrain sandwich thins or flatbreads.  Diet, exercise and weight loss can reverse and cure diabetes in the early stages.  Diet, exercise and weight loss is very important in the control and prevention of complications of diabetes which affects every system in your body, ie. Brain - dementia/stroke, eyes - glaucoma/blindness, heart - heart attack/heart failure, kidneys - dialysis, stomach - gastric paralysis, intestines - malabsorption, nerves - severe painful neuritis, circulation - gangrene & loss of a leg(s), and finally cancer and Alzheimers.    I recommend avoid fried & greasy foods,  sweets/candy, white rice (brown or wild rice or Quinoa is OK), white potatoes (sweet potatoes are OK) - anything   made from white flour - bagels, doughnuts, rolls, buns, biscuits,white and wheat breads, pizza crust and traditional pasta made of white flour & egg white(vegetarian pasta or spinach or wheat pasta is OK).  Multi-grain bread is OK - like multi-grain flat bread or sandwich thins. Avoid alcohol in excess. Exercise is also important.    Eat all the vegetables you want - avoid meat, especially red meat and dairy - especially  cheese.  Cheese is the most concentrated form of trans-fats which is the worst thing to clog up our arteries. Veggie cheese is OK which can be found in the fresh produce section at [employer] or [employer] or [employer]  +++++++++++++++++++++++++++++++++++++++ DASH Eating Plan  DASH stands for "Dietary Approaches to Stop Hypertension."   The DASH eating plan is a healthy eating plan that has been shown to reduce high blood pressure (hypertension). Additional health benefits may include reducing the risk of type 2 diabetes mellitus, heart disease, and stroke. The DASH eating plan may also help with weight loss. WHAT DO I NEED TO KNOW ABOUT THE DASH EATING PLAN? For the DASH eating plan, you will follow these general guidelines: Choose foods with a percent daily value for sodium of less than 5% (as listed on the food label). Use salt-free seasonings or herbs instead of table salt or sea salt. Check with your health care provider or pharmacist before using salt substitutes. Eat lower-sodium products, often labeled as "lower sodium" or "no salt added." Eat fresh foods. Eat more vegetables, fruits, and low-fat dairy products. Choose whole grains. Look for the word "whole" as the first word in the ingredient list. Choose fish  Limit sweets, desserts, sugars, and sugary drinks. Choose heart-healthy fats. Eat veggie cheese  Eat more home-cooked food and less restaurant, buffet, and fast food. Limit fried foods. Cook foods using methods other than frying. Limit canned vegetables. If you do use them, rinse them well to decrease the sodium. When eating at a restaurant, ask that your food be prepared with less salt, or no salt if possible.                      WHAT FOODS CAN I EAT? Read Dr Joel Fuhrman's books on The End of Dieting & The End of Diabetes  Grains Whole grain or whole wheat bread. Brown rice. Whole grain or whole wheat pasta. Quinoa, bulgur, and whole grain cereals. Low-sodium  cereals. Corn or whole wheat flour tortillas. Whole grain cornbread. Whole grain crackers. Low-sodium crackers.  Vegetables Fresh or frozen vegetables (raw, steamed, roasted, or grilled). Low-sodium or reduced-sodium tomato and vegetable juices. Low-sodium or reduced-sodium tomato sauce and paste. Low-sodium or reduced-sodium canned vegetables.   Fruits All fresh, canned (in natural juice), or frozen fruits.  Protein Products  All fish and seafood.  Dried beans, peas, or lentils. Unsalted nuts and seeds. Unsalted canned beans.  Dairy Low-fat dairy products, such as skim or 1% milk, 2% or reduced-fat cheeses, low-fat ricotta or cottage cheese, or plain low-fat yogurt. Low-sodium or reduced-sodium cheeses.  Fats and Oils Tub margarines without trans fats. Light or reduced-fat mayonnaise and salad dressings (reduced sodium). Avocado. Safflower, olive, or canola oils. Natural peanut or almond butter.  Other Unsalted popcorn and pretzels. The items listed above may not be a complete list of recommended foods or beverages. Contact your dietitian for more options.  +++++++++++++++  WHAT FOODS ARE NOT RECOMMENDED? Grains/ White flour or wheat flour White bread. White pasta. White rice. Refined   cornbread. Bagels and croissants. Crackers that contain trans fat.  Vegetables  Creamed or fried vegetables. Vegetables in a . Regular canned vegetables. Regular canned tomato sauce and paste. Regular tomato and vegetable juices.  Fruits Dried fruits. Canned fruit in light or heavy syrup. Fruit juice.  Meat and Other Protein Products Meat in general - RED meat & White meat.  Fatty cuts of meat. Ribs, chicken wings, all processed meats as bacon, sausage, bologna, salami, fatback, hot dogs, bratwurst and packaged luncheon meats.  Dairy Whole or 2% milk, cream, half-and-half, and cream cheese. Whole-fat or sweetened yogurt. Full-fat cheeses or blue cheese. Non-dairy creamers and whipped toppings.  Processed cheese, cheese spreads, or cheese curds.  Condiments Onion and garlic salt, seasoned salt, table salt, and sea salt. Canned and packaged gravies. Worcestershire sauce. Tartar sauce. Barbecue sauce. Teriyaki sauce. Soy sauce, including reduced sodium. Steak sauce. Fish sauce. Oyster sauce. Cocktail sauce. Horseradish. Ketchup and mustard. Meat flavorings and tenderizers. Bouillon cubes. Hot sauce. Tabasco sauce. Marinades. Taco seasonings. Relishes.  Fats and Oils Butter, stick margarine, lard, shortening and bacon fat. Coconut, palm kernel, or palm oils. Regular salad dressings.  Pickles and olives. Salted popcorn and pretzels.  The items listed above may not be a complete list of foods and beverages to avoid.  

## 2022-09-26 NOTE — Progress Notes (Signed)
Future Appointments  Date Time Provider Department  09/26/2022  3:30 PM Unk Pinto, MD [facility]  11/23/2022  2:45 PM Suzzanne Cloud, NP [facility]  11/28/2022  3:00 PM Valentina Shaggy, MD [facility]  12/26/2022                       wellness  3:00 PM Darrol Jump, NP [facility]  06/27/2023                     cpe 10:00 AM Alycia Rossetti, NP [facility]    History of Present Illness:       This very nice 71 y.o. MWF  presents for 3 month follow up with HTN, HLD, T2_NIDDM  and Vitamin D Deficiency.        Patient is treated for HTN  since  2009 & BP has been controlled at home. Today's BP is at goal -  122/70. Patient has had no complaints of any cardiac type chest pain, palpitations, dyspnea Vertell Limber /PND, dizziness, claudication or dependent edema.       Hyperlipidemia is controlled with diet & meds. Patient denies myalgias or other med SE's. Last Lipids were at goal :  Lab Results  Component Value Date   CHOL 105 06/26/2022   HDL 39 (L) 06/26/2022   LDLCALC 45 06/26/2022   TRIG 127 06/26/2022   CHOLHDL 2.7 06/26/2022     Also, the patient has history of T2_NIDDM PreDiabetes and has had no symptoms of reactive hypoglycemia, diabetic polys, paresthesias or visual blurring.  Last A1c was near goal :  Lab Results  Component Value Date   HGBA1C 5.9 (H) 06/26/2022           Patient has been on Thyroid Replacement since 2008 when dx'd Hypothyroid.                                                         Further, the patient also has history of Vitamin D Deficiency and supplements vitamin D . Last vitamin D was sl elevated :  Lab Results  Component Value Date   VD25OH 110 (H) 06/26/2022     Current Outpatient Medications on File Prior to Visit  Medication Sig   aspirin 81 MG tablet Take  daily.   azelastine (ASTELIN) 0.1 % nasal spray Place 1 spray into both nostrils 2   times daily   baclofen (LIORESAL) 10 MG tablet Take 1 tablet  2  times daily as  needed   CALCIUM    1,200 mg  Take  daily.   VITAMIN B12 SL Place 1 tablet under the tongue daily.   DULoxetine 60 MG capsule TAKE 1 CAPSULE DAILY    EPINEPHrine 0.3 mg/0.3 mL  Inject 0.3 mg into the muscle once.   famotidine  40 MG tablet TAKE 1 TABLET EVERY 12 HOURS   FLONASE  nasal spray Place 1-2 sprays into both nostrils at bedtime.   loperamide  2 MG capsule Take 1 capsule as needed for diarrhea or loose stools.   losartan 100 MG tablet TAKE 1 TABLET  DAILY    Magnesium 250 MG TABS Take 2  times daily.   metFORMIN - XR 500 MG  Take  2 tablets   2 x /day  with Meals for Diabetes   metoprolol succinate -XL 50 MG  Take 1 tablet  daily.    montelukast 10 MG tablet Take 1 tablet at bedtime.   Multiple Vitamins-Minerals  Take 1 tablet  daily.    nortriptyline 10 MG capsule TAKE 1 CAPSULE  AT BEDTIME   Omega-3 FISH OIL Take  daily.   omeprazole 20 MG capsule TAKE 1 CAPSULE DAILY    Allergy Shot Inject as directed every 30  days.    rosuvastatin  10 MG tablet TAKE 1 TABLET EVERY DAY    Semaglutide, 1 MG/DOSE, 4 MG/3ML SOPN Inject 1 mg intoskin once a week. ( Unable to get at her pharmacy)    [facility] injection    SYNTHROID 200 MCG tablet TAKE 1/2 TAB ON SATURDAY, TAKE 1 TAB DAILY ON THE OTHER DAYS.    traZODone (DESYREL) 50 MG tablet Take 1/2 to 1 tablet  at Bedtime                               Turmeric  Take   Vitamin D 50,000 u cap Take 1 capsule 1 x /week f     Allergies  Allergen Reactions   Floxin [Ofloxacin] Hives   Invokana [Canagliflozin]     Hematuria- avoid class   Viberzi [Eluxadoline] Hives   Doxycycline Rash   Penicillins Rash     PMHx:   Past Medical History:  Diagnosis Date   Allergy    Anxiety    Arthritis    Asthma    Cataract    Chronic kidney disease (CKD), stage II (mild) 07/01/2014   Overview:  Overview:  Due to DM, last GFR 64    Degenerative tear of triangular fibrocartilage complex (TFCC) of left wrist 01/17/2019   Depression    Fibromyalgia     GERD (gastroesophageal reflux disease)    Hyperlipidemia    Hypertension    Hypoxemia    IBS (irritable bowel syndrome)    Liver disease    Migraine 02/19/2015   Migraines    Obesity    Osteoporosis    Sensorineural hearing loss (SNHL) of left ear with unrestricted hearing of right ear 12/31/2017   Sleep apnea    uses oral device   Sleep difficulties    Thyroid disease    hypothyroidism   Tuberculosis    positive PPD test, chest x ray negative   Type II or unspecified type diabetes mellitus without mention of complication, not stated as uncontrolled    Urticaria    Ventral hernia    Vertigo 02/19/2015     Immunization History  Administered Date(s) Administered   Influenza, High Dose  11/07/2018, 08/07/2019, 09/21/2020, 07/25/2021   Influenza 08/05/2019   PFIZER-SARS-COV-2 Vacc 11/19/2019, 12/10/2019, 08/05/2020   Pneumococcal -13 07/01/2014   Pneumococcal -23 10/09/2002, 05/26/2008, 12/13/2016   Td 10/09/2002, 10/30/2010   Tdap 08/10/2015   Zoster Recombinat (Shingrix) 07/25/2021   Zoster, Live 06/20/2011     Past Surgical History:  Procedure Laterality Date   ABDOMINAL HYSTERECTOMY     ADENOIDECTOMY     CARPAL TUNNEL RELEASE     bilateral   CATARACT EXTRACTION, BILATERAL     CESAREAN SECTION     x2   CHOLECYSTECTOMY     COLONOSCOPY     HERNIA REPAIR  07/24/11   ventral hernia   JOINT REPLACEMENT     bilateral knees   KNEE ARTHROSCOPY  bilateral   SINOSCOPY     TONSILLECTOMY     TOTAL KNEE ARTHROPLASTY     bilateral    FHx:    Reviewed / unchanged  SHx:    Reviewed / unchanged   Systems Review:  Constitutional: Denies fever, chills, wt changes, headaches, insomnia, fatigue, night sweats, change in appetite. Eyes: Denies redness, blurred vision, diplopia, discharge, itchy, watery eyes.  ENT: Denies discharge, congestion, post nasal drip, epistaxis, sore throat, earache, hearing loss, dental pain, tinnitus, vertigo, sinus pain, snoring.  CV: Denies  chest pain, palpitations, irregular heartbeat, syncope, dyspnea, diaphoresis, orthopnea, PND, claudication or edema. Respiratory: denies cough, dyspnea, DOE, pleurisy, hoarseness, laryngitis, wheezing.  Gastrointestinal: Denies dysphagia, odynophagia, heartburn, reflux, water brash, abdominal pain or cramps, nausea, vomiting, bloating, diarrhea, constipation, hematemesis, melena, hematochezia  or hemorrhoids. Genitourinary: Denies dysuria, frequency, urgency, nocturia, hesitancy, discharge, hematuria or flank pain. Musculoskeletal: Denies arthralgias, myalgias, stiffness, jt. swelling, pain, limping or strain/sprain.  Skin: Denies pruritus, rash, hives, warts, acne, eczema or change in skin lesion(s). Neuro: No weakness, tremor, incoordination, spasms, paresthesia or pain. Psychiatric: Denies confusion, memory loss or sensory loss. Endo: Denies change in weight, skin or hair change.  Heme/Lymph: No excessive bleeding, bruising or enlarged lymph nodes.  Physical Exam  BP 122/70   Pulse 90   Temp 97.7 F (36.5 C)   Ht '5\' 3"'$  (1.6 m)   Wt 204 lb 12.8 oz (92.9 kg)   SpO2 98%   BMI 36.28 kg/m   Appears  well nourished, well groomed  and in no distress.  Eyes: PERRLA, EOMs, conjunctiva no swelling or erythema. Sinuses: No frontal/maxillary tenderness ENT/Mouth: EAC's clear, TM's nl w/o erythema, bulging. Nares clear w/o erythema, swelling, exudates. Oropharynx clear without erythema or exudates. Oral hygiene is good. Tongue normal, non obstructing. Hearing intact.  Neck: Supple. Thyroid not palpable. Car 2+/2+ without bruits, nodes or JVD. Chest: Respirations nl with BS clear & equal w/o rales, rhonchi, wheezing or stridor.  Cor: Heart sounds normal w/ regular rate and rhythm without sig. murmurs, gallops, clicks or rubs. Peripheral pulses normal and equal  without edema.  Abdomen: Soft & bowel sounds normal. Non-tender w/o guarding, rebound, hernias, masses or organomegaly.  Lymphatics:  Unremarkable.  Musculoskeletal: Full ROM all peripheral extremities, joint stability, 5/5 strength and normal gait.  Skin: Warm, dry without exposed rashes, lesions or ecchymosis apparent.  Neuro: Cranial nerves intact, reflexes equal bilaterally. Sensory-motor testing grossly intact. Tendon reflexes grossly intact.  Pysch: Alert & oriented x 3.  Insight and judgement nl & appropriate. No ideations.  Assessment and Plan:  1. Essential hypertension  - Continue medication, monitor blood pressure at home.  - Continue DASH diet.  Reminder to go to the ER if any CP,  SOB, nausea, dizziness, severe HA, changes vision/speech.   - CBC with Differential/Platelet - COMPLETE METABOLIC PANEL WITH GFR - Magnesium - TSH  2. Hy - Continue diet/meds, exercise,& lifestyle modifications.  - Continue monitor periodic cholesterol/liver & renal functions   perlipidemia associated with type 2 diabetes mellitus (HCC)  - Lipid panel - TSH  3. Type 2 diabetes mellitus with stage 2 chronic kidney  disease, without long-term current use of insulin (HCC)  - Continue diet, exercise  - Lifestyle modifications.  - Monitor appropriate labs    - Hemoglobin A1c - Insulin, random - tirzepatide (MOUNJARO) 10 MG/0.5ML Pen;  Inject 1 pen (10 mg) every 7 days for Diabetes  ( Dx:  e11.29 )   Dispense: 2 mL; Refill:  0  4. Vitamin D deficiency  - Continue supplementation   - VITAMIN D 25 Hydroxy  5. Hypothyroidism, unspecified type  - TSH  6. Aortic atherosclerosis ([place]) by Abd  CT scan in 2020  - Lipid panel  7. Medication management  - CBC with Differential/Platelet - COMPLETE METABOLIC PANEL WITH GFR - Magnesium - Lipid panel - TSH - Hemoglobin A1c - Insulin, random - VITAMIN D 25 Hydroxy   8. Need for immunization against influenza  - Flu vaccine HIGH DOSE PF         Discussed  regular exercise, BP monitoring, weight control to achieve/maintain BMI less than 25 and discussed med and  SE's. Recommended labs to assess /monitor clinical status .  I discussed the assessment and treatment plan with the patient. The patient was provided an opportunity to ask questions and all were answered. The patient agreed with the plan and demonstrated an understanding of the instructions.  I provided over 30 minutes of exam, counseling, chart review and  complex critical decision making.        The patient was advised to call back or seek an in-person evaluation if the symptoms worsen or if the condition fails to improve as anticipated.   Kirtland Bouchard, MD

## 2022-09-27 ENCOUNTER — Encounter: Payer: Self-pay | Admitting: Internal Medicine

## 2022-09-27 ENCOUNTER — Other Ambulatory Visit (HOSPITAL_COMMUNITY): Payer: Self-pay

## 2022-09-27 ENCOUNTER — Other Ambulatory Visit: Payer: Self-pay | Admitting: Internal Medicine

## 2022-09-27 DIAGNOSIS — E039 Hypothyroidism, unspecified: Secondary | ICD-10-CM

## 2022-09-27 LAB — CBC WITH DIFFERENTIAL/PLATELET
Absolute Monocytes: 683 cells/uL (ref 200–950)
Basophils Absolute: 85 cells/uL (ref 0–200)
Basophils Relative: 0.7 %
Eosinophils Absolute: 305 cells/uL (ref 15–500)
Eosinophils Relative: 2.5 %
HCT: 42.3 % (ref 35.0–45.0)
Hemoglobin: 13.9 g/dL (ref 11.7–15.5)
Lymphs Abs: 4063 cells/uL — ABNORMAL HIGH (ref 850–3900)
MCH: 28.3 pg (ref 27.0–33.0)
MCHC: 32.9 g/dL (ref 32.0–36.0)
MCV: 86 fL (ref 80.0–100.0)
MPV: 11.4 fL (ref 7.5–12.5)
Monocytes Relative: 5.6 %
Neutro Abs: 7064 cells/uL (ref 1500–7800)
Neutrophils Relative %: 57.9 %
Platelets: 345 10*3/uL (ref 140–400)
RBC: 4.92 10*6/uL (ref 3.80–5.10)
RDW: 13.3 % (ref 11.0–15.0)
Total Lymphocyte: 33.3 %
WBC: 12.2 10*3/uL — ABNORMAL HIGH (ref 3.8–10.8)

## 2022-09-27 LAB — COMPLETE METABOLIC PANEL WITH GFR
AG Ratio: 1.7 (calc) (ref 1.0–2.5)
ALT: 24 U/L (ref 6–29)
AST: 20 U/L (ref 10–35)
Albumin: 4.5 g/dL (ref 3.6–5.1)
Alkaline phosphatase (APISO): 92 U/L (ref 37–153)
BUN: 9 mg/dL (ref 7–25)
CO2: 26 mmol/L (ref 20–32)
Calcium: 9.5 mg/dL (ref 8.6–10.4)
Chloride: 100 mmol/L (ref 98–110)
Creat: 0.87 mg/dL (ref 0.60–1.00)
Globulin: 2.7 g/dL (calc) (ref 1.9–3.7)
Glucose, Bld: 101 mg/dL — ABNORMAL HIGH (ref 65–99)
Potassium: 4.3 mmol/L (ref 3.5–5.3)
Sodium: 138 mmol/L (ref 135–146)
Total Bilirubin: 0.5 mg/dL (ref 0.2–1.2)
Total Protein: 7.2 g/dL (ref 6.1–8.1)
eGFR: 71 mL/min/{1.73_m2} (ref >=60)

## 2022-09-27 LAB — LIPID PANEL
Cholesterol: 146 mg/dL (ref <200)
HDL: 47 mg/dL — ABNORMAL LOW (ref >=50)
LDL Cholesterol (Calc): 68 mg/dL (calc)
Non-HDL Cholesterol (Calc): 99 mg/dL (calc) (ref <130)
Total CHOL/HDL Ratio: 3.1 (calc) (ref <5.0)
Triglycerides: 262 mg/dL — ABNORMAL HIGH (ref <150)

## 2022-09-27 LAB — TSH: TSH: 8.17 mIU/L — ABNORMAL HIGH (ref 0.40–4.50)

## 2022-09-27 LAB — MAGNESIUM: Magnesium: 1.9 mg/dL (ref 1.5–2.5)

## 2022-09-27 LAB — HEMOGLOBIN A1C
Hgb A1c MFr Bld: 6.2 % of total Hgb — ABNORMAL HIGH (ref <5.7)
Mean Plasma Glucose: 131 mg/dL
eAG (mmol/L): 7.3 mmol/L

## 2022-09-27 LAB — INSULIN, RANDOM: Insulin: 55.9 u[IU]/mL — ABNORMAL HIGH

## 2022-09-27 LAB — VITAMIN D 25 HYDROXY (VIT D DEFICIENCY, FRACTURES): Vit D, 25-Hydroxy: 74 ng/mL (ref 30–100)

## 2022-09-27 MED ORDER — LEVOTHYROXINE SODIUM 175 MCG PO TABS: 175.0000 ug | ORAL_TABLET | Freq: Every day | ORAL | 0 refills | Status: DC

## 2022-09-27 NOTE — Progress Notes (Signed)
<><><><><><><><><><><><><><><><><><><><><><><><><><><><><><><><><> <><><><><><><><><><><><><><><><><><><><><><><><><><><><><><><><><> - Test results slightly outside the reference range are not unusual. If there is anything important, I will review this with you,  otherwise it is considered normal test values.  If you have further questions,  please do not hesitate to contact me at the office or via My Chart.  <><><><><><><><><><><><><><><><><><><><><><><><><><><><><><><><><> <><><><><><><><><><><><><><><><><><><><><><><><><><><><><><><><><>  -  TSH is a slightly elevated   - Sent in [place] Rx for  Generic levothyroxine 175  mcg to                                                                          take 1 Whole tablet every day  - Please call Office to schedule a Nurse Visit in about 3-4 weeks to recheck your TSH  <><><><><><><><><><><><><><><><><><><><><><><><><><><><><><><><><>  - WBC is slightly elevated & looks suspicious for a viral infection                                                                                                   - Will continue to monitor <><><><><><><><><><><><><><><><><><><><><><><><><><><><><><><><><>  - Total Chol = 146    &   LDL Chol = 68    -   Both  Excellent   - Very low risk for Heart Attack  / Stroke <><><><><><><><><><><><><><><><><><><><><><><><><><><><><><><><><>  - But Triglycerides (   262   ) or fats in blood are too high                 (   Ideal or  Goal is less than 150  !  )    - Recommend avoid fried & greasy foods,  sweets / candy,   - Avoid white rice  (brown or wild rice or Quinoa is OK),   - Avoid white potatoes  (sweet potatoes are OK)   - Avoid anything made from white flour  - bagels, doughnuts, rolls, buns, biscuits, white and   wheat breads, pizza crust and traditional  pasta made of white flour & egg white  - (vegetarian pasta or spinach or wheat pasta is OK).    - Multi-grain bread is OK - like  multi-grain flat bread or  sandwich thins.   - Avoid alcohol in excess.   - Exercise is also important. <><><><><><><><><><><><><><><><><><><><><><><><><><><><><><><><><> <><><><><><><><><><><><><><><><><><><><><><><><><><><><><><><><><>  - A1c = 6.2%  is slightly elevated,                                              but will get better as you lose weight pon the Ozempic <><><><><><><><><><><><><><><><><><><><><><><><><><><><><><><><><>  - Vitamin D = 74 - Excellent   - Please keep dose same   - Vitamin D goal is between 70-100.    - It is very important  as a natural anti-inflammatory and helping the  immune system protect against viral infections, like the Covid-19    helping hair, skin, and nails, as well as reducing stroke and  heart attack risk.   - It helps your bones and helps with mood.  - It also decreases numerous cancer risks so please  take it as directed.   - Low Vit D is associated with a 200-300% higher risk for CANCER   and 200-300% higher risk for HEART   ATTACK  &  STROKE.    - It is also associated with higher death rate at younger ages,   autoimmune diseases like Rheumatoid arthritis, Lupus,   Multiple Sclerosis.      - Also many other serious conditions, like depression, Alzheimer's  Dementia, muscle aches, fatigue, fibromyalgia  <><><><><><><><><><><><><><><><><><><><><><><><><><><><><><><><><> <><><><><><><><><><><><><><><><><><><><><><><><><><><><><><><><><>  - All Else - CBC - Kidneys - Electrolytes - Liver & Magnesium  - all  Normal / OK <><><><><><><><><><><><><><><><><><><><><><><><><><><><><><><><><> <><><><><><><><><><><><><><><><><><><><><><><><><><><><><><><><><>  - Keep up the Great Work   !  <><><><><><><><><><><><><><><><><><><><><><><><><><><><><><><><><> <><><><><><><><><><><><><><><><><><><><><><><><><><><><><><><><><>

## 2022-09-29 ENCOUNTER — Other Ambulatory Visit (HOSPITAL_COMMUNITY): Payer: Self-pay

## 2022-09-29 ENCOUNTER — Other Ambulatory Visit: Payer: Self-pay | Admitting: Internal Medicine

## 2022-09-29 DIAGNOSIS — E1122 Type 2 diabetes mellitus with diabetic chronic kidney disease: Secondary | ICD-10-CM

## 2022-09-29 DIAGNOSIS — E039 Hypothyroidism, unspecified: Secondary | ICD-10-CM

## 2022-09-29 MED ORDER — LEVOTHYROXINE SODIUM 175 MCG PO TABS: 175.0000 ug | ORAL_TABLET | Freq: Every day | ORAL | 0 refills | Status: DC

## 2022-09-29 MED ORDER — TIRZEPATIDE 10 MG/0.5ML ~~LOC~~ SOAJ
10.0000 mg | SUBCUTANEOUS | 0 refills | Status: DC
  Filled 2022-09-29: qty 2, 28d supply, fill #0

## 2022-10-01 ENCOUNTER — Other Ambulatory Visit: Payer: Self-pay | Admitting: Internal Medicine

## 2022-10-01 DIAGNOSIS — E1122 Type 2 diabetes mellitus with diabetic chronic kidney disease: Secondary | ICD-10-CM

## 2022-10-01 MED ORDER — TIRZEPATIDE 10 MG/0.5ML ~~LOC~~ SOAJ: 10.0000 mg | SUBCUTANEOUS | 0 refills | Status: DC

## 2022-10-02 ENCOUNTER — Other Ambulatory Visit (HOSPITAL_COMMUNITY): Payer: Self-pay

## 2022-10-02 ENCOUNTER — Other Ambulatory Visit: Payer: Self-pay | Admitting: Internal Medicine

## 2022-10-02 ENCOUNTER — Other Ambulatory Visit: Payer: Self-pay | Admitting: Nurse Practitioner

## 2022-10-02 DIAGNOSIS — E1122 Type 2 diabetes mellitus with diabetic chronic kidney disease: Secondary | ICD-10-CM

## 2022-10-02 MED ORDER — TIRZEPATIDE 10 MG/0.5ML ~~LOC~~ SOAJ: 10.0000 mg | SUBCUTANEOUS | 0 refills | Status: DC

## 2022-10-02 MED ORDER — TRULICITY 3 MG/0.5ML ~~LOC~~ SOAJ
3.0000 mg | SUBCUTANEOUS | 0 refills | Status: DC
  Filled 2022-10-02: qty 2, 28d supply, fill #0

## 2022-10-03 ENCOUNTER — Other Ambulatory Visit (HOSPITAL_COMMUNITY): Payer: Self-pay

## 2022-10-03 ENCOUNTER — Other Ambulatory Visit: Payer: Self-pay | Admitting: Internal Medicine

## 2022-10-03 DIAGNOSIS — E1122 Type 2 diabetes mellitus with diabetic chronic kidney disease: Secondary | ICD-10-CM

## 2022-10-03 MED ORDER — TRULICITY 4.5 MG/0.5ML ~~LOC~~ SOAJ
4.5000 mg | SUBCUTANEOUS | 3 refills | Status: DC
  Filled 2022-10-03: qty 6, 84d supply, fill #0

## 2022-10-04 ENCOUNTER — Other Ambulatory Visit (HOSPITAL_COMMUNITY): Payer: Self-pay

## 2022-10-09 DIAGNOSIS — M654 Radial styloid tenosynovitis [de Quervain]: Secondary | ICD-10-CM | POA: Diagnosis not present

## 2022-10-11 DIAGNOSIS — G4733 Obstructive sleep apnea (adult) (pediatric): Secondary | ICD-10-CM | POA: Diagnosis not present

## 2022-10-11 DIAGNOSIS — R0902 Hypoxemia: Secondary | ICD-10-CM | POA: Diagnosis not present

## 2022-10-25 DIAGNOSIS — J3081 Allergic rhinitis due to animal (cat) (dog) hair and dander: Secondary | ICD-10-CM | POA: Diagnosis not present

## 2022-10-25 NOTE — Progress Notes (Signed)
VIALS EXP 10-26-23 

## 2022-10-26 DIAGNOSIS — J301 Allergic rhinitis due to pollen: Secondary | ICD-10-CM | POA: Diagnosis not present

## 2022-11-07 ENCOUNTER — Ambulatory Visit: Payer: BC Managed Care – PPO

## 2022-11-09 ENCOUNTER — Ambulatory Visit (INDEPENDENT_AMBULATORY_CARE_PROVIDER_SITE_OTHER): Payer: Medicare HMO

## 2022-11-09 VITALS — BP 118/70 | HR 100 | Temp 97.8°F | Resp 16 | Ht 63.0 in | Wt 203.4 lb

## 2022-11-09 DIAGNOSIS — E039 Hypothyroidism, unspecified: Secondary | ICD-10-CM | POA: Diagnosis not present

## 2022-11-09 NOTE — Progress Notes (Signed)
The patient returns today for repeat TSH. She reports that she is taking her medication as prescribed. She also reports that she is not taking any biotin or biotin containing products. She has no new issues or complaints today.

## 2022-11-10 ENCOUNTER — Other Ambulatory Visit: Payer: Self-pay | Admitting: Internal Medicine

## 2022-11-10 DIAGNOSIS — E039 Hypothyroidism, unspecified: Secondary | ICD-10-CM

## 2022-11-10 LAB — TSH: TSH: 0.21 mIU/L — ABNORMAL LOW (ref 0.40–4.50)

## 2022-11-10 MED ORDER — LEVOTHYROXINE SODIUM 150 MCG PO TABS: ORAL_TABLET | ORAL | 0 refills | Status: DC

## 2022-11-10 NOTE — Progress Notes (Signed)
<><><><><><><><><><><><><><><><><><><><><><><><><><><><><><><><><> <><><><><><><><><><><><><><><><><><><><><><><><><><><><><><><><><> -   Test results slightly outside the reference range are not unusual. If there is anything important, I will review this with you,  otherwise it is considered normal test values.  If you have further questions,  please do not hesitate to contact me at the office or via My Chart.  <><><><><><><><><><><><><><><><><><><><><><><><><><><><><><><><><> <><><><><><><><><><><><><><><><><><><><><><><><><><><><><><><><><>  -  Now TSH is Low    &  shows Thyroid hormone is too high in blood, So . . . . Marland Kitchen  - New Rx for Levothyroxine 150 mcg sent in to replace the l-thyroxine 175 mcg  Please call office to schedule asa Nurse visit in 4 weeks & hopefully                                                                                        this will be the last dose change   - bill mck

## 2022-11-11 DIAGNOSIS — G4733 Obstructive sleep apnea (adult) (pediatric): Secondary | ICD-10-CM | POA: Diagnosis not present

## 2022-11-11 DIAGNOSIS — R0902 Hypoxemia: Secondary | ICD-10-CM | POA: Diagnosis not present

## 2022-11-20 ENCOUNTER — Ambulatory Visit (INDEPENDENT_AMBULATORY_CARE_PROVIDER_SITE_OTHER): Payer: Medicare HMO

## 2022-11-20 DIAGNOSIS — J309 Allergic rhinitis, unspecified: Secondary | ICD-10-CM

## 2022-11-20 NOTE — Progress Notes (Signed)
Immunotherapy   Patient Details  Name: Grace Barry MRN: [id number] Date of Birth: 01-02-1951  11/20/2022  [name] started injections for  new vial for G-W-T. Patient waited 30 minutes with no issues.  Following schedule: A  Frequency:1 time per week Epi-Pen:Epi-Pen Available  Consent signed and patient instructions given.   [name] 11/20/2022, 3:48 PM

## 2022-11-22 ENCOUNTER — Other Ambulatory Visit: Payer: Self-pay | Admitting: Internal Medicine

## 2022-11-22 ENCOUNTER — Encounter: Payer: Self-pay | Admitting: Internal Medicine

## 2022-11-22 DIAGNOSIS — E1129 Type 2 diabetes mellitus with other diabetic kidney complication: Secondary | ICD-10-CM

## 2022-11-22 MED ORDER — TIRZEPATIDE 15 MG/0.5ML ~~LOC~~ SOAJ: SUBCUTANEOUS | 1 refills | Status: DC

## 2022-11-23 ENCOUNTER — Encounter: Payer: Self-pay | Admitting: Neurology

## 2022-11-23 ENCOUNTER — Ambulatory Visit (INDEPENDENT_AMBULATORY_CARE_PROVIDER_SITE_OTHER): Payer: Medicare HMO | Admitting: Neurology

## 2022-11-23 VITALS — BP 139/79 | HR 90 | Ht 63.0 in | Wt 203.0 lb

## 2022-11-23 DIAGNOSIS — G43009 Migraine without aura, not intractable, without status migrainosus: Secondary | ICD-10-CM

## 2022-11-23 MED ORDER — BACLOFEN 10 MG PO TABS: 10.0000 mg | ORAL_TABLET | Freq: Two times a day (BID) | ORAL | 3 refills | Status: DC | PRN

## 2022-11-23 MED ORDER — NORTRIPTYLINE HCL 10 MG PO CAPS: ORAL_CAPSULE | ORAL | 3 refills | Status: DC

## 2022-11-23 NOTE — Progress Notes (Addendum)
PATIENT: Grace Barry DOB: 22-Feb-1951  REASON FOR VISIT: follow up for migraines, tremor HISTORY FROM: patient Primary Neurologist: Dr. Vickey Huger   HISTORY OF PRESENT ILLNESS: Today 11/23/22  Doing very good, has only needed baclofen < 5 times this whole year. Remains on nortriptyline 10 mg at bedtime. Tremor in hands worse when nervous. No health issues. Going to [employer] next year.   11/17/21 SS: Grace Barry is here today for fol follow-up low-up with history of migraine headache and tremor. Since July headaches are much better, only 4. Doesn't think tizanidine works as well as baclofen to get rid of headaches. Still on nortriptyline 10 mg at bedtime, no side effects, working well. Stress is some better, her friend is now home. Tremor in hands only when super stressed. Still on oxygen at night, pulmonary. Just got back from [place], going to [place] in October.   05/12/2021 SS: Grace Barry is a 72 year old female with history of migraine headache and tremor.  Had sleep study in November 2021 using dental device, recommended to come for an in lab study.  While using the dental device longer oxygen desaturation time, low oxygen may be because of fatigue and headaches. Now sleeping with 2 L oxygen, seeing pulmonary, doesn't sleep well, up and down all night. Reports 3 weeks ago, headaches returned, tension type headache, like wearing a cap. Typical migraines in all over head, migraine features. Right now feels like tight ball camp. No sinus drainage or issues. 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 No neuro symptoms. Still search for words at times. MRI of the brain was normal in Jan 2019. Taking baclofen as needed, helps pain but doesn't alleviate, makes drowsy. Still on Toprol-XL.   Update 07/28/2020 SS: Grace Barry is a 72 year old female with history of migraine headache and reported tremor.  She has come off Topamax, on her own, feels memory and  cognition is clearer.  Headaches remain well controlled, on metoprolol. For headache, will take baclofen with good benefit.  Tremor in the hands, left greater than the right, intermittent, worse with anxiety, overall stable, doesn't interfere with daily activity.  Recently saw Dr. Vickey Huger for sleep consultation, pending sleep study, due to fatigue.  She wears a dental device.  Is overall doing well.  Presents today for follow-up unaccompanied.  HISTORY 07/28/2019 SS: Grace Barry is a 72 year old female with history of migraine headache and reported tremor.  She remains on Topamax 50 mg daily, and metoprolol XR 50 mg daily.  She indicates this provides excellent control of her migraine headaches.  She says she is having less than 1 headache a month.  If she gets a headache, it may be moderate intensity, but taking her baclofen relieves the headache.  She indicates that her memory decline seems to have improved.  After last visit, she tried to decrease her dose of Topamax due to report of memory decline, but her headaches increased. She is currently undergoing evaluation by cardiology for EKG changes.  She is planning to have a cardiac stress test tomorrow.  She indicates she has been suffering from significant fatigue.  She does report tremor in her hands, left is worse than the right.  This does not impact her daily activity or handwriting.  She notices it worse during times of anxiety or stress.  She says she has had a few stumbles, but no falls. She presents today for follow-up via virtual visit.  REVIEW OF SYSTEMS: Out of a complete 14 system review of symptoms, the patient complains only of the following symptoms, and all other reviewed systems are negative.  Headache   ALLERGIES: Allergies  Allergen Reactions   Floxin [Ofloxacin] Hives   Invokana [Canagliflozin]     Hematuria- avoid class   Viberzi [Eluxadoline] Hives   Doxycycline Rash   Penicillins Rash    HOME MEDICATIONS: Outpatient  Medications Prior to Visit  Medication Sig Dispense Refill   aspirin 81 MG tablet Take 81 mg by mouth daily.     azelastine (ASTELIN) 0.1 % nasal spray Place 1 spray into both nostrils 2 (two) times daily. Use in each nostril as directed 30 mL 5   baclofen (LIORESAL) 10 MG tablet Take 1 tablet (10 mg total) by mouth 2 (two) times daily as needed for muscle spasms. 30 each 3   CALCIUM PO Take 1,200 mg by mouth daily.     Cyanocobalamin (VITAMIN B12 SL) Place 1 tablet under the tongue daily.     DULoxetine (CYMBALTA) 60 MG capsule TAKE 1 CAPSULE DAILY FOR CHRONIC PAIN SYNDROME. 90 capsule 3   EPINEPHrine 0.3 mg/0.3 mL IJ SOAJ injection Inject 0.3 mg into the muscle once.     famotidine (PEPCID) 40 MG tablet TAKE 1 TABLET EVERY 12 HOURS TO PREVENT HEARTBURN & INDIGESTION 180 tablet 1   fluticasone (FLONASE) 50 MCG/ACT nasal spray Place 1-2 sprays into both nostrils at bedtime. 48 mL 3   glucose blood (ONE TOUCH ULTRA TEST) test strip Check Blood sugar Daily for diabetes 100 each 3   Lancets (ONETOUCH ULTRASOFT) lancets Check Blood sugar  daily 100 each 3   levothyroxine (SYNTHROID) 150 MCG tablet Take  1 tablet  Daily  on an empty stomach with only water for 30 minutes & no Antacid meds, Calcium or Magnesium for 4 hours & avoid Biotin 50 tablet 0   loperamide (IMODIUM) 2 MG capsule Take 1 capsule (2 mg total) by mouth as needed for diarrhea or loose stools. 30 capsule 0   losartan (COZAAR) 100 MG tablet TAKE 1 TABLET BY MOUTH DAILY FOR BLOOD PRESSURE AND KIDNEY PROTECTION 90 tablet 3   Magnesium 250 MG TABS Take 250 mg by mouth 2 (two) times daily.     metFORMIN (GLUCOPHAGE-XR) 500 MG 24 hr tablet Take  2 tablets   2 x /day   with Meals for Diabetes 360 tablet 3   metoprolol succinate (TOPROL-XL) 50 MG 24 hr tablet Take 1 tablet (50 mg total) by mouth daily. Take with or immediately following a meal. 90 tablet 3   montelukast (SINGULAIR) 10 MG tablet Take 1 tablet (10 mg total) by mouth at bedtime.  30 tablet 5   Multiple Vitamins-Minerals (MULTIVITAMIN PO) Take 1 tablet by mouth daily.      nortriptyline (PAMELOR) 10 MG capsule TAKE 1 CAPSULE BY MOUTH EVERYDAY AT BEDTIME 90 capsule 3   Omega-3 Fatty Acids (FISH OIL PO) Take by mouth daily.     omeprazole (PRILOSEC) 20 MG capsule TAKE 1 CAPSULE DAILY TO PREVENT HEARTBURN & ACID REFLUX 90 capsule 1   PRESCRIPTION MEDICATION Inject as directed every 30 (thirty) days. Allergy Shot     rosuvastatin (CRESTOR) 10 MG tablet TAKE 1 TABLET BY MOUTH EVERY DAY FOR CHOLESTEROL 90 tablet 1   SHINGRIX injection      tirzepatide (MOUNJARO) 15 MG/0.5ML Pen Inject  1 pen (15 mg) into Skin every 7 days  for Diabetes  (Dx:  e11.29) 6  mL 1   Turmeric (QC TUMERIC COMPLEX PO) Take by mouth.     Vitamin D, Ergocalciferol, (DRISDOL) 1.25 MG (50000 UNIT) CAPS capsule Take 1 capsule 1 x /week for Vit D Deficiency 12 capsule 1   No facility-administered medications prior to visit.    PAST MEDICAL HISTORY: Past Medical History:  Diagnosis Date   Allergy    Anxiety    Arthritis    Asthma    Cataract    Chronic kidney disease (CKD), stage II (mild) 07/01/2014   Overview:  Overview:  Due to DM, last GFR 64    Degenerative tear of triangular fibrocartilage complex (TFCC) of left wrist 01/17/2019   Depression    Fibromyalgia    GERD (gastroesophageal reflux disease)    Hyperlipidemia    Hypertension    Hypoxemia    IBS (irritable bowel syndrome)    Liver disease    Migraine 02/19/2015   Migraines    Obesity    Osteoporosis    Sensorineural hearing loss (SNHL) of left ear with unrestricted hearing of right ear 12/31/2017   Sleep apnea    uses oral device   Sleep difficulties    Thyroid disease    hypothyroidism   Tuberculosis    positive PPD test, chest x ray negative   Type II or unspecified type diabetes mellitus without mention of complication, not stated as uncontrolled    Urticaria    Ventral hernia    Vertigo 02/19/2015    PAST SURGICAL  HISTORY: Past Surgical History:  Procedure Laterality Date   ABDOMINAL HYSTERECTOMY     ADENOIDECTOMY     CARPAL TUNNEL RELEASE     bilateral   CATARACT EXTRACTION, BILATERAL     CESAREAN SECTION     x2   CHOLECYSTECTOMY     COLONOSCOPY     HERNIA REPAIR  07/24/11   ventral hernia   JOINT REPLACEMENT     bilateral knees   KNEE ARTHROSCOPY     bilateral   SINOSCOPY     TONSILLECTOMY     TOTAL KNEE ARTHROPLASTY     bilateral    FAMILY HISTORY: Family History  Problem Relation Age of Onset   Breast cancer Mother    Hypertension Father    Hyperlipidemia Father    Prostate cancer Father    Hyperlipidemia Sister    Esophageal cancer Son 81       Died 09-Dec-2013  Colon cancer Son    Stomach cancer Son    Colon polyps Neg Hx    Diabetes Neg Hx    Kidney disease Neg Hx    Migraines Neg Hx    Rectal cancer Neg Hx     SOCIAL HISTORY: Social History   Socioeconomic History   Marital status: Married    Spouse name: Fredrik Cove   Number of children: 2   Years of education: masters   Highest education level: Not on file  Occupational History   Occupation: Retired Runner, broadcasting/film/video  Tobacco Use   Smoking status: Never   Smokeless tobacco: Never  Vaping Use   Vaping Use: Never used  Substance and Sexual Activity   Alcohol use: Yes    Alcohol/week: 0.0 standard drinks of alcohol    Comment: occasional   Drug use: No   Sexual activity: Not on file  Other Topics Concern   Not on file  Social History Narrative   Lives with husband, Fredrik Cove   Patient is right handed.   Patient drinks  64 oz of diet soda daily.   Social Determinants of Health   Financial Resource Strain: Not on file  Food Insecurity: Not on file  Transportation Needs: Not on file  Physical Activity: Not on file  Stress: Not on file  Social Connections: Not on file  Intimate Partner Violence: Not on file   PHYSICAL EXAM  Vitals:   11/23/22 1443  BP: 139/79  Pulse: 90  Weight: 203 lb (92.1 kg)  Height: 5'  3" (1.6 m)   Body mass index is 35.96 kg/m.  Generalized: Well developed, in no acute distress Neurological examination  Mentation: Alert oriented to time, place, history taking. Follows all commands speech and language fluent Cranial nerve II-XII: Pupils were equal round reactive to light. Extraocular movements were full, visual field were full on confrontational test. Facial sensation and strength were normal.  Head turning and shoulder shrug were normal and symmetric.   Motor: The motor testing reveals 5 over 5 strength of all 4 extremities. Good symmetric motor tone is noted throughout.  Sensory: Sensory testing is intact to soft touch on all 4 extremities. No evidence of extinction is noted.  Coordination: Cerebellar testing reveals good finger-nose-finger and heel-to-shin bilaterally.  No tremor seen. Gait and station: Gait is normal.  Reflexes: Deep tendon reflexes are symmetric and normal bilaterally.   DIAGNOSTIC DATA (LABS, IMAGING, TESTING) - I reviewed patient records, labs, notes, testing and imaging myself where available.  Lab Results  Component Value Date   WBC 12.2 (H) 09/26/2022   HGB 13.9 09/26/2022   HCT 42.3 09/26/2022   MCV 86.0 09/26/2022   PLT 345 09/26/2022      Component Value Date/Time   NA 138 09/26/2022 1539   NA 140 07/02/2019 1214   K 4.3 09/26/2022 1539   CL 100 09/26/2022 1539   CO2 26 09/26/2022 1539   GLUCOSE 101 (H) 09/26/2022 1539   BUN 9 09/26/2022 1539   BUN 11 07/02/2019 1214   CREATININE 0.87 09/26/2022 1539   CALCIUM 9.5 09/26/2022 1539   PROT 7.2 09/26/2022 1539   ALBUMIN 4.1 10/19/2020 1356   AST 20 09/26/2022 1539   ALT 24 09/26/2022 1539   ALKPHOS 73 10/19/2020 1356   BILITOT 0.5 09/26/2022 1539   GFRNONAA 82 12/22/2020 1625   GFRAA 94 12/22/2020 1625   Lab Results  Component Value Date   CHOL 146 09/26/2022   HDL 47 (L) 09/26/2022   LDLCALC 68 09/26/2022   TRIG 262 (H) 09/26/2022   CHOLHDL 3.1 09/26/2022   Lab  Results  Component Value Date   HGBA1C 6.2 (H) 09/26/2022   Lab Results  Component Value Date   VITAMINB12 396 05/18/2022   Lab Results  Component Value Date   TSH 0.21 (L) 11/09/2022   ASSESSMENT AND PLAN 72 y.o. year old female  has a past medical history of Allergy, Anxiety, Arthritis, Asthma, Cataract, Chronic kidney disease (CKD), stage II (mild) (07/01/2014), Degenerative tear of triangular fibrocartilage complex (TFCC) of left wrist (01/17/2019), Depression, Fibromyalgia, GERD (gastroesophageal reflux disease), Hyperlipidemia, Hypertension, Hypoxemia, IBS (irritable bowel syndrome), Liver disease, Migraine (02/19/2015), Migraines, Obesity, Osteoporosis, Sensorineural hearing loss (SNHL) of left ear with unrestricted hearing of right ear (12/31/2017), Sleep apnea, Sleep difficulties, Thyroid disease, Tuberculosis, Type II or unspecified type diabetes mellitus without mention of complication, not stated as uncontrolled, Urticaria, Ventral hernia, and Vertigo (02/19/2015). here with:  1.  Migraine headache 2.  Tremor  -Novel continues to do very well, her headaches are under excellent control,  wishes to continue current therapy -Continue Nortriptyline 10 mg at bedtime -Continue baclofen 10 mg as needed for acute headache -Follow-up in 1 year or sooner if needed  Addendum 10/22/23 SS: PCP referred for sleep study, saw Dr. Vickey Huger for hypersomnia had [facility] 10/15/23 Fragmented sleep, prolonged sleep latency and very brief  REM sleep periods. No apnea, no PLMs, no EMG activity in REM sleep, normal EEG , EKG. Patient was on 2 L/m of oxygen during this study. An organic sleep disorder was not documented. No follow up in sleep medicine needed.   Margie Ege, AGNP-C, DNP 11/23/2022, 2:52 PM [facility] [address], [address] [place], [place] [address] [phone]

## 2022-11-28 ENCOUNTER — Ambulatory Visit: Payer: Medicare HMO | Admitting: Allergy & Immunology

## 2022-11-30 ENCOUNTER — Ambulatory Visit: Payer: Medicare HMO | Admitting: Allergy & Immunology

## 2022-11-30 IMAGING — CT CT ABD-PELV W/ CM
2 of 5 series · 16 of 46 positions shown, 18 images · IV contrast (Omnipaque)
Comparison: CT abdomen pelvis dated 01/15/2020.

CLINICAL DATA: 68-year-old female with abdominal distension.

EXAM:
CT ABDOMEN AND PELVIS WITH CONTRAST
TECHNIQUE: Multidetector CT imaging of the abdomen and pelvis was performed
using the standard protocol following bolus administration of
intravenous contrast.
CONTRAST:  100mL OMNIPAQUE IOHEXOL 300 MG/ML  SOLN

[Series 2: axial st · axial · 0.88mm/px · z∈[+732,+1127]mm · 13 of 89 slices shown, 15 images]
[im 5/89  soft-tissue]
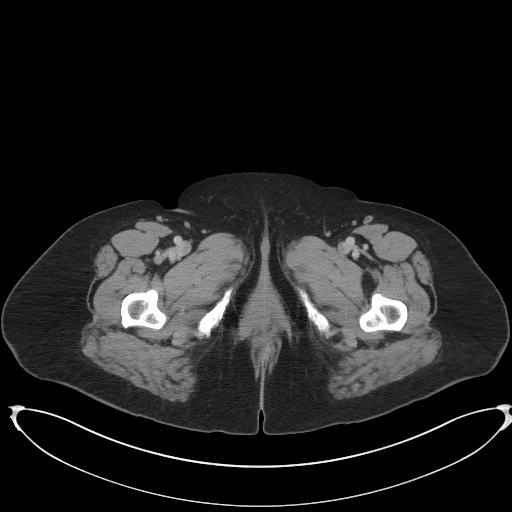
[im 5/89  bone]
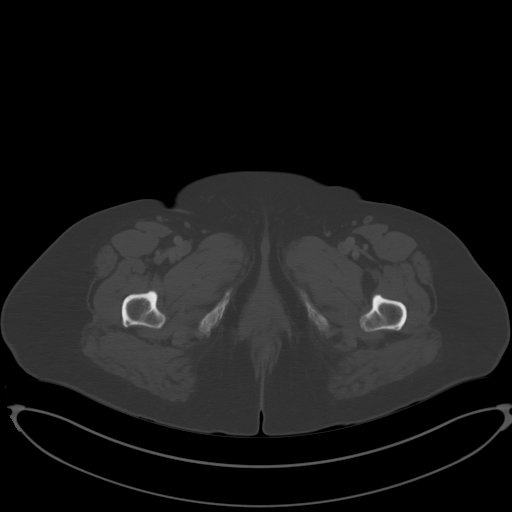
[im 14/89  soft-tissue]
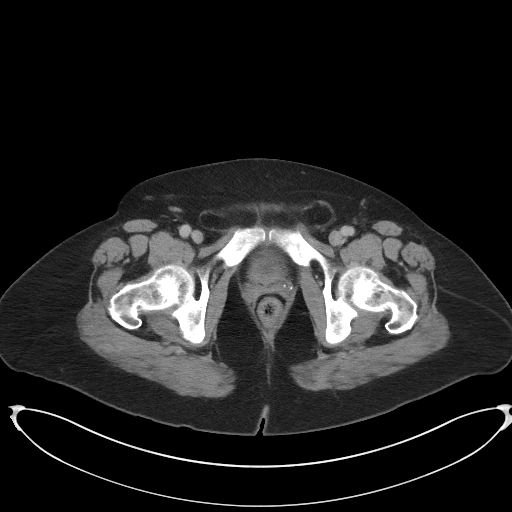
[im 19/89  soft-tissue]
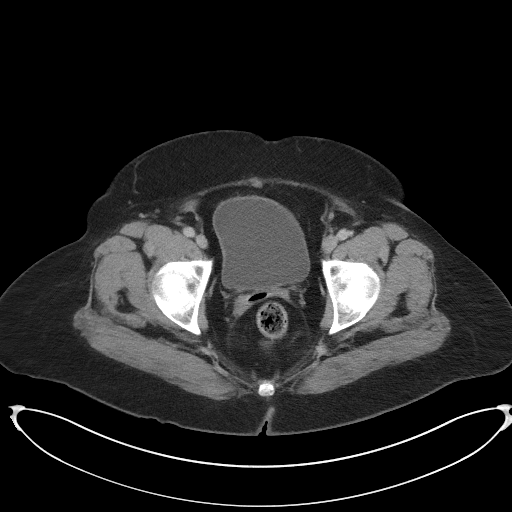
[im 24/89  soft-tissue]
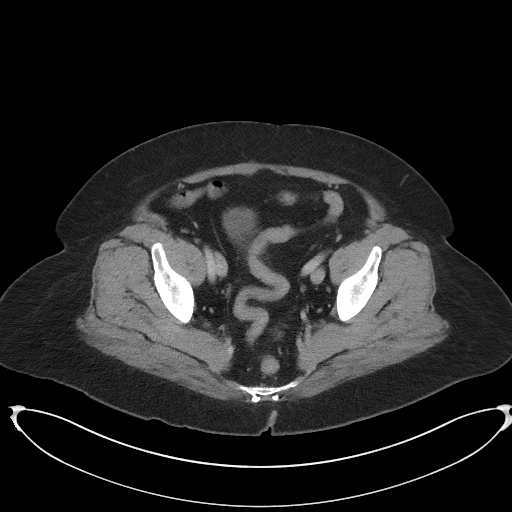
[im 33/89  soft-tissue]
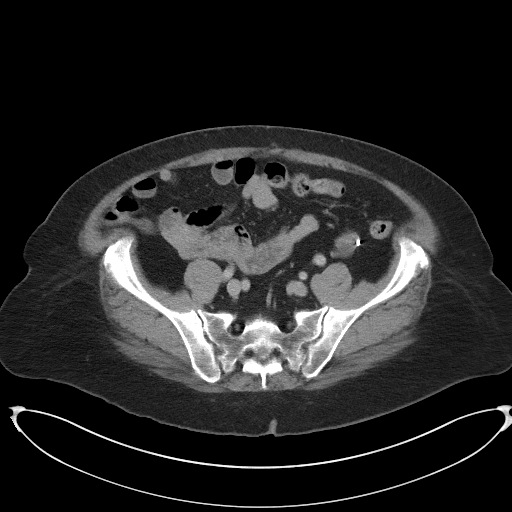
[im 38/89  soft-tissue]
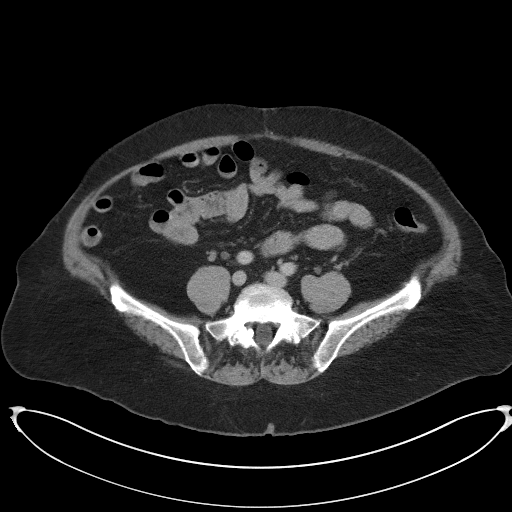
[im 47/89  soft-tissue]
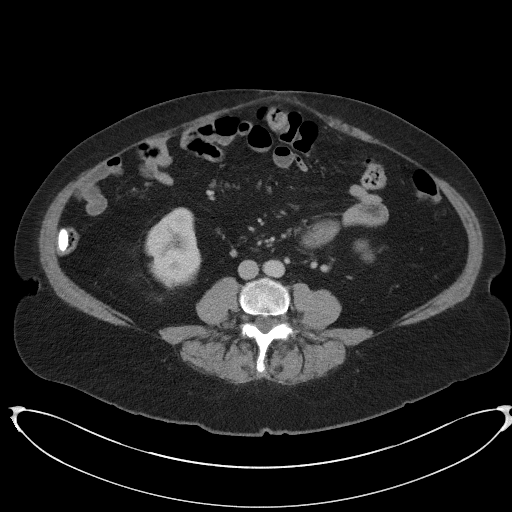
[im 51/89  soft-tissue]
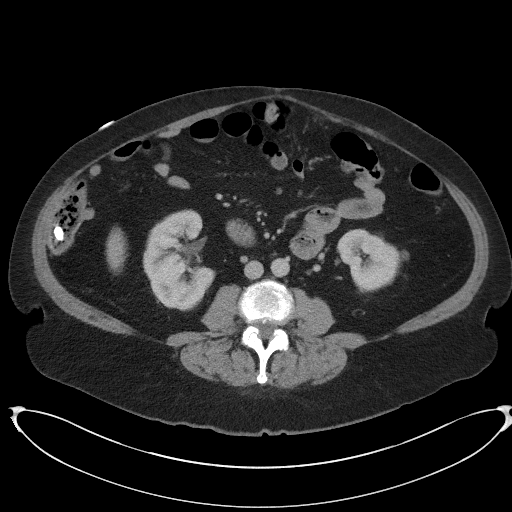
[im 56/89  soft-tissue]
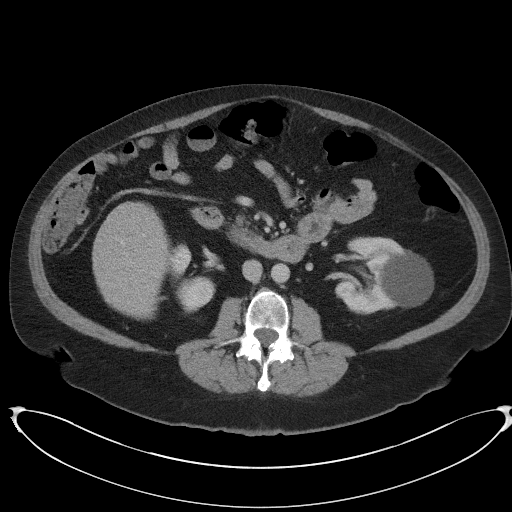
[im 56/89  bone]
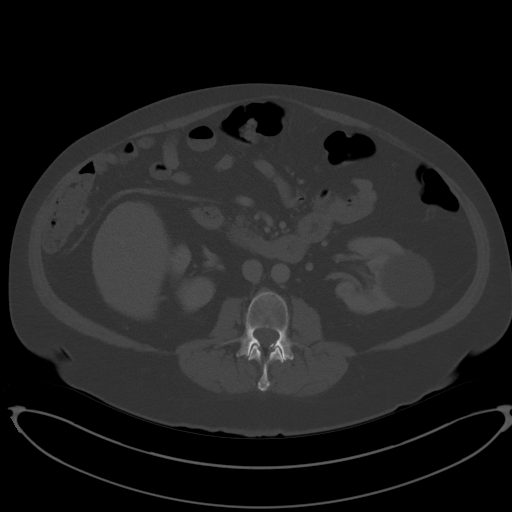
[im 65/89  soft-tissue]
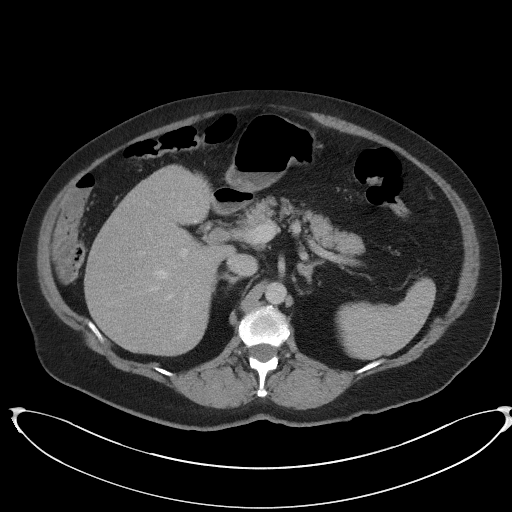
[im 70/89  soft-tissue]
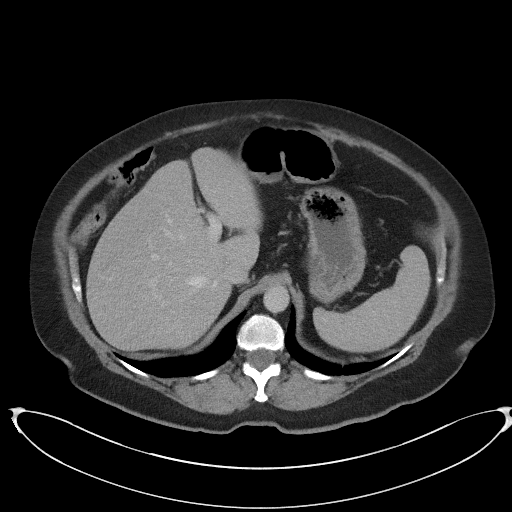
[im 75/89  soft-tissue]
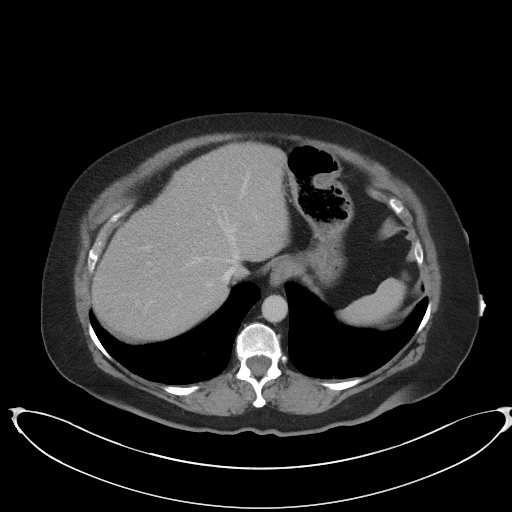
[im 84/89  soft-tissue]
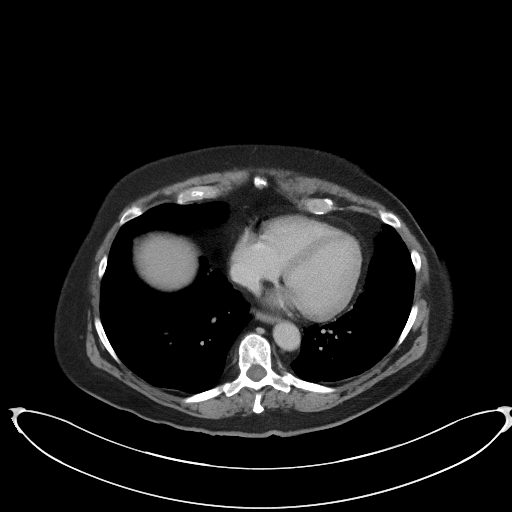

[Series 5: coronal st · coronal · 0.88mm/px · 3 of 122 slices shown]
[im 41/122  soft-tissue]
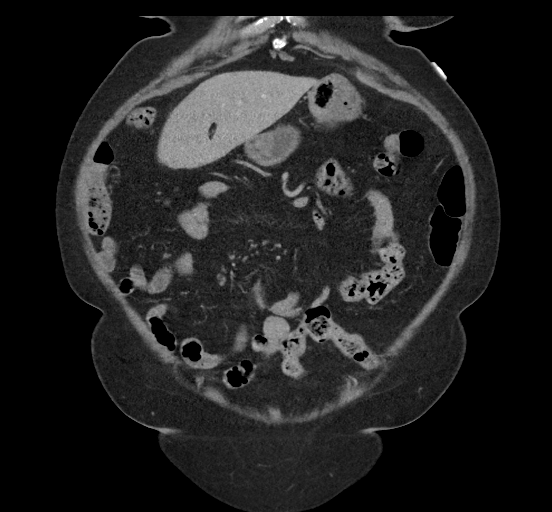
[im 54/122  soft-tissue]
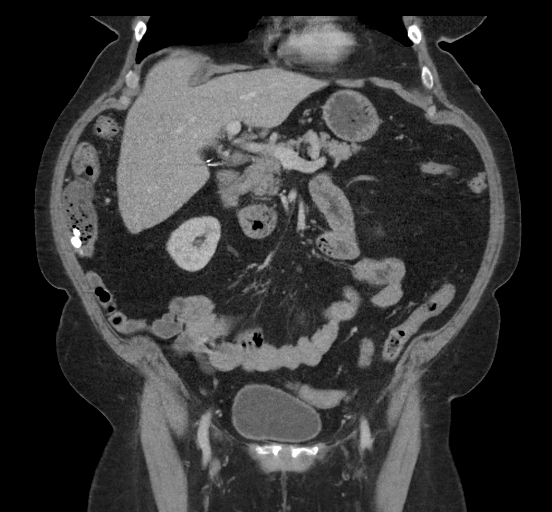
[im 68/122  soft-tissue]
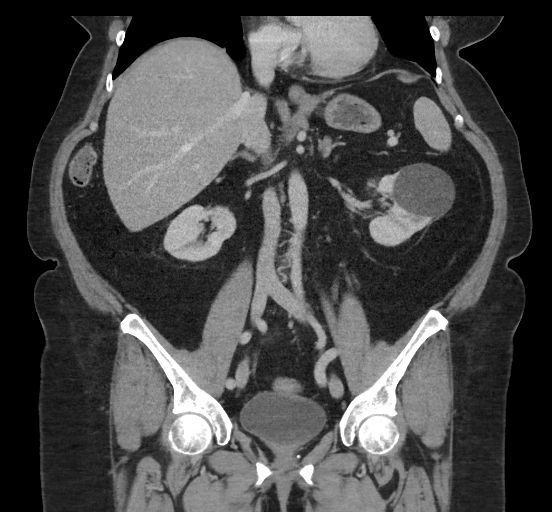

[16 of 46 positions shown; findings below may reference images not displayed]

FINDINGS: Lower chest: Left lung base linear atelectasis/scarring. The
visualized lung bases are otherwise clear.

No intra-abdominal free air or free fluid.

Hepatobiliary: Fatty infiltration of the liver. No intrahepatic
biliary dilatation. The gallbladder is surgically absent. No
retained calcified stone noted in the central CBD.

Pancreas: Unremarkable. No pancreatic ductal dilatation or
surrounding inflammatory changes.

Spleen: Normal in size without focal abnormality.

Adrenals/Urinary Tract: The adrenal glands are unremarkable. There
is a 6 cm left renal interpolar cyst. There is no hydronephrosis on
either side. There is symmetric enhancement and excretion of
contrast by both kidneys. The visualized ureters and urinary bladder
appear unremarkable.

Stomach/Bowel: There is no bowel obstruction or active inflammation.
The appendix is not visualized with certainty. No inflammatory
changes identified in the right lower quadrant.

Vascular/Lymphatic: The abdominal aorta and IVC unremarkable. No
portal venous gas. Mildly enlarged lymph node in the porta hepaticus
measures 12 mm in short axis, likely reactive. This is similar to
prior CT. No new adenopathy.

Reproductive: Hysterectomy. There is a 2.5 cm indeterminate left
ovarian cystic lesion similar to prior CT. Surgical clips noted
adjacent to the left ovary. A 15 mm hypodense lesion is also noted
adjacent to the right ovary. Findings are relatively stable dating
back to 05/26/2011 suggestive of a benign or indolent etiology.

Other: None

Musculoskeletal: No acute or significant osseous findings.
IMPRESSION: 1. No acute intra-abdominal or pelvic pathology.
2. Fatty liver.
3. Stable bilateral ovarian cystic lesions.

## 2022-12-03 ENCOUNTER — Other Ambulatory Visit: Payer: Self-pay | Admitting: Neurology

## 2022-12-07 ENCOUNTER — Ambulatory Visit (INDEPENDENT_AMBULATORY_CARE_PROVIDER_SITE_OTHER): Payer: Medicare HMO | Admitting: Allergy & Immunology

## 2022-12-07 ENCOUNTER — Encounter: Payer: Self-pay | Admitting: Allergy & Immunology

## 2022-12-07 ENCOUNTER — Other Ambulatory Visit: Payer: Self-pay

## 2022-12-07 VITALS — BP 110/70 | HR 110 | Temp 97.4°F | Resp 16 | Wt 200.3 lb

## 2022-12-07 DIAGNOSIS — R519 Headache, unspecified: Secondary | ICD-10-CM | POA: Diagnosis not present

## 2022-12-07 DIAGNOSIS — J3089 Other allergic rhinitis: Secondary | ICD-10-CM

## 2022-12-07 DIAGNOSIS — J302 Other seasonal allergic rhinitis: Secondary | ICD-10-CM

## 2022-12-07 MED ORDER — CLARITHROMYCIN 500 MG PO TABS: 500.0000 mg | ORAL_TABLET | Freq: Two times a day (BID) | ORAL | 0 refills | Status: AC

## 2022-12-07 NOTE — Progress Notes (Signed)
FOLLOW UP  Date of Service/Encounter:  12/07/22   Assessment:   Seasonal and perennial allergic rhinitis - stable on allergen immunotherapy  Frontal headaches - resolved with antibiotics  Multiple antibiotic allergy labels - addressing with drug challenges over the next few weeks  Plan/Recommendations:   1. Allergic rhinoconjunctivitis - on allergy shots - Continue with allergy shots at the same schedule.  - We are going to do 0.05 mL, 0.1 mL, 0.2 mL, 0.3 mL, 0.4 mL, and then 0.5 mL (then it will space to every 4 weeks).  - Continue with fluticasone and azelastine twice daily since this seems to be working. - Continue with Singulair (montelukast) 88m daily  2. Follow up for a penicillin and a doxycycline challenge over the next 4 weeks.     Subjective:   MAntonieta ParamoChance is a 72y.o. female presenting today for follow up of  Chief Complaint  Patient presents with   Follow-up    About a week ago she started feeling like a sinus crud set in.     MGarvin FilaChance has a history of the following: Patient Active Problem List   Diagnosis Date Noted   Hypoxemia associated with sleep 10/02/2020   Leukocytosis 09/27/2020   Tremor 07/28/2020   Uncontrolled REM sleep behavior disorder 07/20/2020   Sleep paralysis, recurrent isolated 07/20/2020   Bad dreams 07/20/2020   Psychophysiological insomnia 07/20/2020   Aortic atherosclerosis ([place] Abd  CT scan in 2020 12/12/2019   Pain 01/17/2019   Hyperlipidemia associated with type 2 diabetes mellitus ([facility] 11/07/2018   Osteopenia 11/07/2018   Seasonal and perennial allergic rhinitis 07/17/2018   Vitamin D deficiency disease 05/19/2015   Medication management 05/19/2015   Migraine 02/19/2015   Vertigo 02/19/2015   History of colonic polyps 01/15/2015   IBS (irritable bowel syndrome) 09/01/2014   Morbid obesity ([facility] 07/01/2014   CKD stage 2 due to type 2 diabetes mellitus ([facility] 07/01/2014   Fatty liver    Arthritis     Hypothyroidism 12/15/2010   T2_NIDDM w/CKD ([facility] 12/15/2010   Depression, major, recurrent, in remission ([facility] 12/15/2010   Obstructive sleep apnea 12/15/2010   Essential hypertension 12/15/2010   GERD 12/15/2010    History obtained from: chart review and patient.  MEddisis a 72y.o. female presenting for a follow up visit.  She was last seen in July 2023.  At the last visit, we continue with allergy shots at the same schedule.  We remixed her shots to focus on the pollens.  We continue with Flonase and Astelin.  We started Singulair.  Since last visit, she has largely done well. She was doing fine until one week ago. She was having a horrible headache. This was the same spring pattern. What works best is the [facility]. This contains acetaminophen, an H1 blocker, and phenylephrine. This does not last long. No one else is sick. She did not COVID test.  Her symptoms have just gotten worse over time.  Allergic Rhinitis Symptom History: She just  started her new vials.  His vials contain only the pollen since those seem to affect her the most egregiously.  Therefore, we focused on those allergens to provide the most effective treatment of her symptoms.  Headaches are under good control.  MLaloniis on allergen immunotherapy. She receives one injection. Immunotherapy script #1 contains trees, weeds, and grasses. She currently receives 0.035mof the RED vial (1/100). She started shots in February of 2024 and not yet  reached maintenance. We did not make her go back to [facility] since she was tolerating allergy shots with these allergens without a problem in the past.   She has taken Augmentin in the past. She had a local reaction to a shot at age 4.  This is where her penicillin allergy entered the picture. She had hives once with doxycycline.  She is open to doing challenges to remove these from her list.  CT sinus (May 2023): IMPRESSION: 1. Air-fluid level in the left sphenoid sinus, as can be seen  with acute sinusitis. Correlate with symptoms. 2. Otherwise normally aerated paranasal sinuses. Patent sinus drainage pathways.    She is getting along with her stepdaughter now.  They are allowed to see her step grandkids.  They went to [place] and had a great time.  Otherwise, there have been no changes to her past medical history, surgical history, family history, or social history.    Review of Systems  Constitutional: Negative.  Negative for chills, fever, malaise/fatigue and weight loss.  HENT:  Positive for congestion and sinus pain. Negative for ear discharge and ear pain.   Eyes:  Negative for pain, discharge and redness.  Respiratory:  Negative for cough, sputum production, shortness of breath and wheezing.   Cardiovascular: Negative.  Negative for chest pain and palpitations.  Gastrointestinal:  Negative for abdominal pain, constipation, diarrhea, heartburn, nausea and vomiting.  Skin: Negative.  Negative for itching and rash.  Neurological:  Negative for dizziness and headaches.  Endo/Heme/Allergies:  Positive for environmental allergies. Does not bruise/bleed easily.       Objective:   Blood pressure 110/70, pulse (!) 110, temperature (!) 97.4 F (36.3 C), temperature source Temporal, resp. rate 16, weight 200 lb 4.8 oz (90.9 kg), SpO2 97 %. Body mass index is 35.48 kg/m.    Physical Exam Vitals reviewed.  Constitutional:      Appearance: She is well-developed.  HENT:     Head: Normocephalic and atraumatic.     Right Ear: Tympanic membrane, ear canal and external ear normal.     Left Ear: Tympanic membrane, ear canal and external ear normal.     Nose: No nasal deformity, septal deviation, mucosal edema or rhinorrhea.     Right Turbinates: Enlarged, swollen and pale.     Left Turbinates: Enlarged, swollen and pale.     Right Sinus: No maxillary sinus tenderness or frontal sinus tenderness.     Left Sinus: No maxillary sinus tenderness or frontal sinus  tenderness.     Mouth/Throat:     Mouth: Mucous membranes are not pale and not dry.     Pharynx: Uvula midline.  Eyes:     General:        Right eye: No discharge.        Left eye: No discharge.     Conjunctiva/sclera: Conjunctivae normal.     Right eye: Right conjunctiva is not injected. No chemosis.    Left eye: Left conjunctiva is not injected. No chemosis.    Pupils: Pupils are equal, round, and reactive to light.  Cardiovascular:     Rate and Rhythm: Normal rate and regular rhythm.     Heart sounds: Normal heart sounds.  Pulmonary:     Effort: Pulmonary effort is normal. No tachypnea, accessory muscle usage or respiratory distress.     Breath sounds: Normal breath sounds. No wheezing, rhonchi or rales.  Chest:     Chest wall: No tenderness.  Lymphadenopathy:  Cervical: No cervical adenopathy.  Skin:    Coloration: Skin is not pale.     Findings: No abrasion, erythema, petechiae or rash. Rash is not papular, urticarial or vesicular.  Neurological:     Mental Status: She is alert.  Psychiatric:        Behavior: Behavior is cooperative.      Diagnostic studies: none      Salvatore Marvel, MD  [facility] [place]

## 2022-12-07 NOTE — Patient Instructions (Addendum)
1. Allergic rhinoconjunctivitis - on allergy shots - Continue with allergy shots at the same schedule.  - We are going to do 0.05 mL, 0.1 mL, 0.2 mL, 0.3 mL, 0.4 mL, and then 0.5 mL (then it will space to every 4 weeks).  - Continue with fluticasone and azelastine twice daily since this seems to be working. - Continue with Singulair (montelukast) '10mg'$  daily  2. Follow up for a penicillin and a doxycycline challenge over the next 4 weeks.    Please inform us of any Emergency Department visits, hospitalizations, or changes in symptoms. Call us before going to the ED for breathing or allergy symptoms since we might be able to fit you in for a sick visit. Feel free to contact us anytime with any questions, problems, or concerns.  It was a pleasure to see you again today!  Websites that have reliable patient information: 1. American Academy of Asthma, Allergy, and Immunology: www.aaaai.org 2. Food Allergy Research and Education (FARE): foodallergy.org 3. Mothers of Asthmatics: http://www.asthmacommunitynetwork.org 4. American College of Allergy, Asthma, and Immunology: www.acaai.org   COVID-19 Vaccine Information can be found at: [email or web address] For questions related to vaccine distribution or appointments, please email [email or web address] or call [phone].     "Like" [place] on Facebook and Instagram for our latest updates!       Make sure you are registered to vote! If you have moved or changed any of your contact information, you will need to get this updated before voting!  In some cases, you MAY be able to register to vote online: [email or web address]

## 2022-12-10 MED ORDER — PENICILLIN V POTASSIUM 125 MG/5ML PO SOLR: 500.0000 mg | Freq: Once | ORAL | 0 refills | Status: AC

## 2022-12-12 DIAGNOSIS — G4733 Obstructive sleep apnea (adult) (pediatric): Secondary | ICD-10-CM | POA: Diagnosis not present

## 2022-12-12 DIAGNOSIS — R0902 Hypoxemia: Secondary | ICD-10-CM | POA: Diagnosis not present

## 2022-12-14 ENCOUNTER — Encounter: Payer: Self-pay | Admitting: Allergy & Immunology

## 2022-12-14 ENCOUNTER — Other Ambulatory Visit: Payer: Self-pay | Admitting: *Deleted

## 2022-12-14 ENCOUNTER — Telehealth: Payer: Self-pay | Admitting: *Deleted

## 2022-12-14 ENCOUNTER — Ambulatory Visit (INDEPENDENT_AMBULATORY_CARE_PROVIDER_SITE_OTHER): Payer: Medicare HMO | Admitting: Allergy & Immunology

## 2022-12-14 ENCOUNTER — Other Ambulatory Visit: Payer: Self-pay

## 2022-12-14 VITALS — BP 132/80 | HR 102 | Temp 96.9°F | Resp 18 | Ht 63.0 in | Wt 197.6 lb

## 2022-12-14 DIAGNOSIS — J309 Allergic rhinitis, unspecified: Secondary | ICD-10-CM

## 2022-12-14 DIAGNOSIS — R21 Rash and other nonspecific skin eruption: Secondary | ICD-10-CM

## 2022-12-14 DIAGNOSIS — J3089 Other allergic rhinitis: Secondary | ICD-10-CM

## 2022-12-14 DIAGNOSIS — Z889 Allergy status to unspecified drugs, medicaments and biological substances status: Secondary | ICD-10-CM

## 2022-12-14 MED ORDER — DOXYCYCLINE HYCLATE 100 MG PO TBEC: DELAYED_RELEASE_TABLET | ORAL | 0 refills | Status: DC

## 2022-12-14 NOTE — Patient Instructions (Signed)
Penicillin allergy - You passed your penicillin challenge!  - Call us with any issues over the next 2-3 days. - We will see you for the next drug challenge.  - My number is [phone].  Follow up as scheduled.   Please inform us of any Emergency Department visits, hospitalizations, or changes in symptoms. Call us before going to the ED for breathing or allergy symptoms since we might be able to fit you in for a sick visit. Feel free to contact us anytime with any questions, problems, or concerns.  It was a pleasure to see you again today!  Websites that have reliable patient information: 1. American Academy of Asthma, Allergy, and Immunology: www.aaaai.org 2. Food Allergy Research and Education (FARE): foodallergy.org 3. Mothers of Asthmatics: http://www.asthmacommunitynetwork.org 4. American College of Allergy, Asthma, and Immunology: www.acaai.org   COVID-19 Vaccine Information can be found at: [email or web address] For questions related to vaccine distribution or appointments, please email [email or web address] or call [phone].   We realize that you might be concerned about having an allergic reaction to the COVID19 vaccines. To help with that concern, WE ARE OFFERING THE COVID19 VACCINES IN OUR OFFICE! Ask the front desk for dates!     "Like" [place] on Facebook and Instagram for our latest updates!      A healthy democracy works best when [employer] participate! Make sure you are registered to vote! If you have moved or changed any of your contact information, you will need to get this updated before voting!  In some cases, you MAY be able to register to vote online: [email or web address]

## 2022-12-14 NOTE — Progress Notes (Signed)
FOLLOW UP  Date of Service/Encounter:  12/14/22   Assessment:   Seasonal and perennial allergic rhinitis - stable on allergen immunotherapy   Frontal headaches - resolved with antibiotics   Multiple antibiotic allergy labels - addressing with drug challenges over the next few weeks  Plan/Recommendations:   Penicillin allergy - You passed your penicillin challenge!  - Call us with any issues over the next 2-3 days. - We will see you for the next drug challenge.  - My number is [phone].  Follow up as scheduled.  Subjective:   Grace Barry is a 72 y.o. female presenting today for follow up of  Chief Complaint  Patient presents with   Food/Drug Challenge    Drug - [place] has a history of the following: Patient Active Problem List   Diagnosis Date Noted   Hypoxemia associated with sleep 10/02/2020   Leukocytosis 09/27/2020   Tremor 07/28/2020   Uncontrolled REM sleep behavior disorder 07/20/2020   Sleep paralysis, recurrent isolated 07/20/2020   Bad dreams 07/20/2020   Psychophysiological insomnia 07/20/2020   Aortic atherosclerosis ([place]) by Abd  CT scan in 2020 12/12/2019   Pain 01/17/2019   Hyperlipidemia associated with type 2 diabetes mellitus ([place]) 11/07/2018   Osteopenia 11/07/2018   Seasonal and perennial allergic rhinitis 07/17/2018   Vitamin D deficiency disease 05/19/2015   Medication management 05/19/2015   Migraine 02/19/2015   Vertigo 02/19/2015   History of colonic polyps 01/15/2015   IBS (irritable bowel syndrome) 09/01/2014   Morbid obesity ([place]) 07/01/2014   CKD stage 2 due to type 2 diabetes mellitus ([place]) 07/01/2014   Fatty liver    Arthritis    Hypothyroidism 12/15/2010   T2_NIDDM w/CKD ([place]) 12/15/2010   Depression, major, recurrent, in remission ([place]) 12/15/2010   Obstructive sleep apnea 12/15/2010   Essential hypertension 12/15/2010   GERD 12/15/2010    History obtained from: chart review and  patient.  Grace Barry is a 72 y.o. female presenting for a drug challenge.  She was last seen a little over a week ago and we discussed some of her drug allergies.  She decided to try to address these with some challenges in the office.  Her penicillin allergy was from when she was a child and she had a local reaction to a penicillin shot.  Otherwise, there have been no changes to her past medical history, surgical history, family history, or social history.    Review of Systems  Constitutional: Negative.  Negative for chills, fever, malaise/fatigue and weight loss.  HENT:  Negative for congestion, ear discharge, ear pain and sinus pain.   Eyes:  Negative for pain, discharge and redness.  Respiratory:  Negative for cough, sputum production, shortness of breath and wheezing.   Cardiovascular: Negative.  Negative for chest pain and palpitations.  Gastrointestinal:  Negative for abdominal pain, constipation, diarrhea, heartburn, nausea and vomiting.  Skin: Negative.  Negative for itching and rash.  Neurological:  Negative for dizziness and headaches.  Endo/Heme/Allergies:  Positive for environmental allergies. Does not bruise/bleed easily.       Objective:   Blood pressure 132/80, pulse (!) 102, temperature (!) 96.9 F (36.1 C), temperature source Temporal, resp. rate 18, height 5' 3"$  (1.6 m), weight 197 lb 9.6 oz (89.6 kg), SpO2 97 %. Body mass index is 35 kg/m.   Physical exam deferred since this was a skin testing appointment only.    Oral Challenge - 12/14/22 1100  Challenge Food/Drug Penicillin    Lot #  if Applicable [id number]    Food/Drug provided by Patient    Comments First dose 30 minutes, second dose 1 hour, vitals at end if no issues    BP 132/80    Pulse 108    Respirations 18    Lungs 97    Skin Clear    Mouth Clear    Time 0945    Dose 63m    Lungs Clear    Skin Clear    Mouth Clear    Time 1016    Dose 115m   BP 130/86    Pulse 100    Respirations 18     Lungs 97    Skin Clear    Mouth Clear             Allergy testing results were read and interpreted by myself, documented by clinical staff.      JoSalvatore MarvelMD  [facility]

## 2022-12-14 NOTE — Telephone Encounter (Signed)
Received a message from the pharmacy that the Doxycycline that you sent in is non formulary, do you want to try and send in a different type?

## 2022-12-15 ENCOUNTER — Other Ambulatory Visit: Payer: Self-pay | Admitting: Internal Medicine

## 2022-12-15 ENCOUNTER — Other Ambulatory Visit: Payer: Self-pay | Admitting: Allergy & Immunology

## 2022-12-15 DIAGNOSIS — F3340 Major depressive disorder, recurrent, in remission, unspecified: Secondary | ICD-10-CM

## 2022-12-15 NOTE — Telephone Encounter (Signed)
I need a tablet that is able to be cut with a pill splitter.   Salvatore Marvel, MD [facility]

## 2022-12-18 NOTE — Telephone Encounter (Signed)
Thanks, Sharyn Lull!

## 2022-12-18 NOTE — Telephone Encounter (Signed)
Called [facility] - [phone] - spoke to Kevin/pharmacist - DOB verified - advised of provider notation below.  Grace Barry stated he can fill the regular dose instead of delayed release - gave verbal to change from delayed release to regular.  Forwarding message to provider as update.

## 2022-12-20 ENCOUNTER — Ambulatory Visit (INDEPENDENT_AMBULATORY_CARE_PROVIDER_SITE_OTHER): Payer: Medicare HMO

## 2022-12-20 DIAGNOSIS — J309 Allergic rhinitis, unspecified: Secondary | ICD-10-CM

## 2022-12-26 ENCOUNTER — Ambulatory Visit: Payer: BC Managed Care – PPO | Admitting: Nurse Practitioner

## 2022-12-27 ENCOUNTER — Ambulatory Visit: Payer: BC Managed Care – PPO | Admitting: Nurse Practitioner

## 2023-01-01 ENCOUNTER — Ambulatory Visit (INDEPENDENT_AMBULATORY_CARE_PROVIDER_SITE_OTHER): Payer: Medicare HMO | Admitting: Nurse Practitioner

## 2023-01-01 ENCOUNTER — Encounter: Payer: Self-pay | Admitting: Nurse Practitioner

## 2023-01-01 ENCOUNTER — Ambulatory Visit (INDEPENDENT_AMBULATORY_CARE_PROVIDER_SITE_OTHER): Payer: Medicare HMO | Admitting: *Deleted

## 2023-01-01 VITALS — BP 110/70 | HR 88 | Temp 97.9°F | Resp 16 | Ht 63.0 in | Wt 197.6 lb

## 2023-01-01 DIAGNOSIS — E785 Hyperlipidemia, unspecified: Secondary | ICD-10-CM | POA: Diagnosis not present

## 2023-01-01 DIAGNOSIS — E559 Vitamin D deficiency, unspecified: Secondary | ICD-10-CM | POA: Diagnosis not present

## 2023-01-01 DIAGNOSIS — K219 Gastro-esophageal reflux disease without esophagitis: Secondary | ICD-10-CM | POA: Diagnosis not present

## 2023-01-01 DIAGNOSIS — E1122 Type 2 diabetes mellitus with diabetic chronic kidney disease: Secondary | ICD-10-CM

## 2023-01-01 DIAGNOSIS — K580 Irritable bowel syndrome with diarrhea: Secondary | ICD-10-CM

## 2023-01-01 DIAGNOSIS — Z0000 Encounter for general adult medical examination without abnormal findings: Secondary | ICD-10-CM

## 2023-01-01 DIAGNOSIS — R6889 Other general symptoms and signs: Secondary | ICD-10-CM | POA: Diagnosis not present

## 2023-01-01 DIAGNOSIS — J309 Allergic rhinitis, unspecified: Secondary | ICD-10-CM

## 2023-01-01 DIAGNOSIS — G4734 Idiopathic sleep related nonobstructive alveolar hypoventilation: Secondary | ICD-10-CM

## 2023-01-01 DIAGNOSIS — R69 Illness, unspecified: Secondary | ICD-10-CM | POA: Diagnosis not present

## 2023-01-01 DIAGNOSIS — F3340 Major depressive disorder, recurrent, in remission, unspecified: Secondary | ICD-10-CM

## 2023-01-01 DIAGNOSIS — E039 Hypothyroidism, unspecified: Secondary | ICD-10-CM

## 2023-01-01 DIAGNOSIS — Z860100 Personal history of colon polyps, unspecified: Secondary | ICD-10-CM

## 2023-01-01 DIAGNOSIS — M1990 Unspecified osteoarthritis, unspecified site: Secondary | ICD-10-CM | POA: Diagnosis not present

## 2023-01-01 DIAGNOSIS — E1169 Type 2 diabetes mellitus with other specified complication: Secondary | ICD-10-CM | POA: Diagnosis not present

## 2023-01-01 DIAGNOSIS — N182 Chronic kidney disease, stage 2 (mild): Secondary | ICD-10-CM

## 2023-01-01 DIAGNOSIS — I700 Atherosclerosis of aorta: Secondary | ICD-10-CM

## 2023-01-01 DIAGNOSIS — Z0001 Encounter for general adult medical examination with abnormal findings: Secondary | ICD-10-CM

## 2023-01-01 DIAGNOSIS — K760 Fatty (change of) liver, not elsewhere classified: Secondary | ICD-10-CM

## 2023-01-01 DIAGNOSIS — I10 Essential (primary) hypertension: Secondary | ICD-10-CM

## 2023-01-01 DIAGNOSIS — Z79899 Other long term (current) drug therapy: Secondary | ICD-10-CM

## 2023-01-01 DIAGNOSIS — M8580 Other specified disorders of bone density and structure, unspecified site: Secondary | ICD-10-CM

## 2023-01-01 MED ORDER — VITAMIN D (ERGOCALCIFEROL) 1.25 MG (50000 UNIT) PO CAPS: ORAL_CAPSULE | ORAL | 1 refills | Status: DC

## 2023-01-01 NOTE — Progress Notes (Signed)
MEDICARE ANNUAL WELLNESS VISIT AND FOLLOW UP  Assessment:   Annual Medicare Wellness Visit Due annually  Health maintenance reviewed  Atherosclerosis of aorta ([place]) Per CT 07/2019 Control blood pressure, cholesterol, glucose, increase exercise.    Essential hypertension Discussed DASH (Dietary Approaches to Stop Hypertension) DASH diet is lower in sodium than a typical American diet. Cut back on foods that are high in saturated fat, cholesterol, and trans fats. Eat more whole-grain foods, fish, poultry, and nuts Remain active and exercise as tolerated daily.  Monitor BP at home-Call if greater than 130/80.  Check CMP/CBC   Asthma, unspecified asthma severity, uncomplicated Resolved - Asthma specialist has removed diagnosis  Gastroesophageal reflux disease without esophagitis No suspected reflux complications ([place]/stricture). Lifestyle modification:  wt loss, avoid meals 2-3h before bedtime. Consider eliminating food triggers:  chocolate, caffeine, EtOH, acid/spicy food.   Other specified hypothyroidism Controlled. Continue Levothyroxine. Reminded to take on an empty stomach 30-14mns before food.  Stop any Biotin Supplement 48-72 hours before next TSH level to reduce the risk of falsely low TSH levels. Continue to monitor.     Diabetes mellitus due to underlying condition with diabetic chronic kidney disease ([facility] Education: Reviewed 'ABCs' of diabetes management  Discussed goals to be met and/or maintained include A1C (<7) Blood pressure (<130/80) Cholesterol (LDL <70) Continue Eye Exam yearly  Continue Dental Exam Q6 mo Discussed dietary recommendations Discussed Physical Activity recommendations Check A1C  CKD stage 2 associated with T2DM ([place] Discussed how what you eat and drink can aide in kidney protection. Stay well hydrated. Avoid high salt foods. Avoid NSAIDS. Keep BP and BG well controlled.   Take medications as prescribed. Remain active and  exercise as tolerated daily. Maintain weight.  Continue to monitor. Check CMP/GFR/Microablumin  Hyperlipidemia associated with T2DM ([place]  Discussed lifestyle modifications. Recommended diet heavy in fruits and veggies, omega 3's. Decrease consumption of animal meats, cheeses, and dairy products. Remain active and exercise as tolerated. Continue to monitor. Check lipids/TSH  Medication management All medications discussed and reviewed in full. All questions and concerns regarding medications addressed.    Arthritis RICE, NSAIDS PRN, ortho referral if needed  IBS Diarrhea Controlled Stay well hydrated Suggest probiotic Reduce/avoid triggers  History of colonic polyps UTD colonoscopy  Depression, major, recurrent, in remission ([place] In remission on medications Discussed diet/exerise, sleep hygiene, stress management, hydration  Morbid obesity ([place] - BMI 35+ with htn, hld, T2dm Discussed appropriate BMI Goal of losing 1 lb per month. Diet modification. Physical activity. Encouraged/praised to build confidence.  Liver disease/ fatty liver disease Check labs, avoid tylenol, alcohol, weight loss advised.   Obstructive sleep apnea/nocturnal hypoxia No longer has per most recent sleep study; Dr. DBrett Fairyfollowing - she continues to sleep  with oxygen.  Weight loss encouraged  Osteopenia DEXA q2y Pursue a combination of weight-bearing exercises and strength training. Patients with severe mobility impairment should be referred for physical therapy. Advised on fall prevention measures including proper lighting in all rooms, removal of area rugs and floor clutter, use of walking devices as deemed appropriate, avoidance of uneven walking surfaces. Smoking cessation and moderate alcohol consumption if applicable Consume [id number] 1000 IU of vitamin D daily with a goal vitamin D serum value of 30 ng/mL or higher. Aim for 1000 to 1200 mg of elemental calcium daily through  supplements and/or dietary sources.  Orders Placed This Encounter  Procedures   CBC with Differential/Platelet   COMPLETE METABOLIC PANEL WITH GFR   Lipid panel  TSH   Hemoglobin A1c   VITAMIN D 25 Hydroxy (Vit-D Deficiency, Fractures)   Meds ordered this encounter  Medications   Vitamin D, Ergocalciferol, (DRISDOL) 1.25 MG (50000 UNIT) CAPS capsule    Sig: Take 1 capsule 1 x /week for Vit D Deficiency    Dispense:  12 capsule    Refill:  1    Order Specific Question:   Supervising Provider    Answer:   Unk Pinto [phone]    Notify office for further evaluation and treatment, questions or concerns if any reported s/s fail to improve.   The patient was advised to call back or seek an in-person evaluation if any symptoms worsen or if the condition fails to improve as anticipated.   Further disposition pending results of labs. Discussed med's effects and SE's.    I discussed the assessment and treatment plan with the patient. The patient was provided an opportunity to ask questions and all were answered. The patient agreed with the plan and demonstrated an understanding of the instructions.  Discussed med's effects and SE's. Screening labs and tests as requested with regular follow-up as recommended.  I provided 40 minutes of face-to-face time during this encounter including counseling, chart review, and critical decision making was preformed.   Future Appointments  Date Time Provider [place]  04/04/2023  2:30 PM Unk Pinto, MD [facility] None  07/10/2023  2:00 PM Alycia Rossetti, NP [facility] None  12/06/2023  2:15 PM Suzzanne Cloud, NP [facility] None  12/28/2023 11:00 AM Darrol Jump, NP [facility] None     Plan:   During the course of the visit the patient was educated and counseled about appropriate screening and preventive services including:   Pneumococcal vaccine  Influenza vaccine Td vaccine Screening electrocardiogram Screening  mammography Bone densitometry screening Colorectal cancer screening Diabetes screening Glaucoma screening Nutrition counseling  Advanced directives: given info/requested   Subjective:   Marieta Dimambro Ziff is a 72 y.o. female who presents for an annual medicare visit and 3 month follow up. She has Hypothyroidism; T2_NIDDM w/CKD ([place]); Depression, major, recurrent, in remission ([place]); Obstructive sleep apnea; Essential hypertension; GERD; Fatty liver; Arthritis; Morbid obesity ([place]); History of colonic polyps; Migraine; Vertigo; Vitamin D deficiency disease; Medication management; CKD stage 2 due to type 2 diabetes mellitus ([place]); Seasonal and perennial allergic rhinitis; Hyperlipidemia associated with type 2 diabetes mellitus ([place]); Osteopenia; Aortic atherosclerosis ([place]) by Abd  CT scan in 2020; IBS (irritable bowel syndrome); Pain; Uncontrolled REM sleep behavior disorder; Sleep paralysis, recurrent isolated; Bad dreams; Psychophysiological insomnia; Tremor; Leukocytosis; and Hypoxemia associated with sleep on their problem list.  Overall she reports feeling well today.  She has no new or additional concerns at this time.  She has hx of sleep apnea, was on oral device for many years; she was having severe dreams and concern for REM disorder per Dr. Brett Fairy, home study was inconclusive, had follow sleep study 09/27/2020 showing significant hypoxemia, oxygen level was 90% with desaturations to about 84%, had unremarkable cardiology and pulm work up, recently started on 2L O2 by [place], has improved AM HA. Continues compliance.  Reports well rested sleep.  She has GERD, taking omeprazole 20 mg PM and famotidine 40 mg PM and no longer having breakthrough.  She has hx of major depression, chronic widespread myalgias suspected for fibromyalgia, switched from paxil to cymbalta with benefit. She has history of migraines, improved on nortriptyline, she has been following with Dr. Jannifer Franklin for balance and mental  fogging,  changed some medications which helped.   BMI is Body mass index is 35 kg/m., she has not been working on diet and exercise.  Wt Readings from Last 3 Encounters:  01/04/23 194 lb (88 kg)  01/01/23 197 lb 9.6 oz (89.6 kg)  12/14/22 197 lb 9.6 oz (89.6 kg)   She had CT coronary 07/2019 -CAD-RADS 1. Minimal non-obstructive CAD (0-24%) but did show aortic atherosclerosis She recently had benign ECHO in 12/08/2020 with EF of 60-65%  Her blood pressure has been controlled at home,  Metoprolol '50mg'$  XL for palpitations, losartan 100 mg,BP: 110/70 (108/70 stand 95 HR, 112/70 lay 92 HR)  She does not workout. She denies chest pain, shortness of breath, dizziness.   She is on cholesterol medication, crestor 10 mg daily and denies myalgias. Her cholesterol is at goal. The cholesterol was:   Lab Results  Component Value Date   CHOL 103 01/01/2023   HDL 40 (L) 01/01/2023   LDLCALC 40 01/01/2023   TRIG 148 01/01/2023   CHOLHDL 2.6 01/01/2023   She has been working on diet and exercise for Diabetes with CKD III controlled on metformin and ozempic 1 mg/week - SE with invokana, she is on bASA, she is on ACE/ARB, and denies paresthesia of the feet, polydipsia, polyuria and visual disturbances.  She checks occasional fasting, around 110, does have supplies.  Last A1C was:  Lab Results  Component Value Date   HGBA1C 6.1 (H) 01/01/2023    She has CKD II associated with T2DM monitored at this office:  Lab Results  Component Value Date   EGFR 65 01/01/2023   Patient is on Vitamin D supplement, taking 50000 IU once a week.  Lab Results  Component Value Date   VD25OH 58 01/01/2023   She is on thyroid medication. Her medication was not changed last visit, taking 200 mcg daily but admits was missing, has been taking 200 mcg daily.  Lab Results  Component Value Date   TSH 0.67 01/01/2023     Medication Review Current Outpatient Medications on File Prior to Visit  Medication Sig   aspirin  81 MG tablet Take 81 mg by mouth daily.   azelastine (ASTELIN) 0.1 % nasal spray Place 1 spray into both nostrils 2 (two) times daily. Use in each nostril as directed   baclofen (LIORESAL) 10 MG tablet TAKE 1 TABLET BY MOUTH TWICE A DAY AS NEEDED FOR MUSCLE SPASMS   CALCIUM PO Take 1,200 mg by mouth daily.   Cyanocobalamin (VITAMIN B12 SL) Place 1 tablet under the tongue daily.   doxycycline (DORYX) 100 MG EC tablet Please bring doxycyline tablets to the office for an observed challenge to remove this from your allergy list.   DULoxetine (CYMBALTA) 60 MG capsule TAKE 1 CAPSULE DAILY FOR CHRONIC PAIN SYNDROME.   EPINEPHrine 0.3 mg/0.3 mL IJ SOAJ injection Inject 0.3 mg into the muscle once.   famotidine (PEPCID) 40 MG tablet TAKE 1 TABLET EVERY 12 HOURS TO PREVENT HEARTBURN & INDIGESTION   fluticasone (FLONASE) 50 MCG/ACT nasal spray Place 1-2 sprays into both nostrils at bedtime.   glucose blood (ONE TOUCH ULTRA TEST) test strip Check Blood sugar Daily for diabetes   Lancets (ONETOUCH ULTRASOFT) lancets Check Blood sugar  daily   loperamide (IMODIUM) 2 MG capsule Take 1 capsule (2 mg total) by mouth as needed for diarrhea or loose stools.   losartan (COZAAR) 100 MG tablet TAKE 1 TABLET BY MOUTH DAILY FOR BLOOD PRESSURE AND KIDNEY PROTECTION  Magnesium 250 MG TABS Take 250 mg by mouth 2 (two) times daily.   metFORMIN (GLUCOPHAGE-XR) 500 MG 24 hr tablet Take  2 tablets   2 x /day   with Meals for Diabetes   metoprolol succinate (TOPROL-XL) 50 MG 24 hr tablet Take 1 tablet (50 mg total) by mouth daily. Take with or immediately following a meal.   montelukast (SINGULAIR) 10 MG tablet TAKE 1 TABLET BY MOUTH EVERYDAY AT BEDTIME   Multiple Vitamins-Minerals (MULTIVITAMIN PO) Take 1 tablet by mouth daily.    nortriptyline (PAMELOR) 10 MG capsule TAKE 1 CAPSULE BY MOUTH EVERYDAY AT BEDTIME   Omega-3 Fatty Acids (FISH OIL PO) Take by mouth daily.   omeprazole (PRILOSEC) 20 MG capsule TAKE 1 CAPSULE DAILY  TO PREVENT HEARTBURN & ACID REFLUX   PRESCRIPTION MEDICATION Inject as directed every 30 (thirty) days. Allergy Shot   rosuvastatin (CRESTOR) 10 MG tablet TAKE 1 TABLET BY MOUTH EVERY DAY FOR CHOLESTEROL   SHINGRIX injection    tirzepatide (MOUNJARO) 15 MG/0.5ML Pen Inject  1 pen (15 mg) into Skin every 7 days  for Diabetes  (Dx:  e11.29)   Turmeric (QC TUMERIC COMPLEX PO) Take by mouth.   No current facility-administered medications on file prior to visit.    Current Problems (verified) Patient Active Problem List   Diagnosis Date Noted   Hypoxemia associated with sleep 10/02/2020   Leukocytosis 09/27/2020   Tremor 07/28/2020   Uncontrolled REM sleep behavior disorder 07/20/2020   Sleep paralysis, recurrent isolated 07/20/2020   Bad dreams 07/20/2020   Psychophysiological insomnia 07/20/2020   Aortic atherosclerosis ([place]) by Abd  CT scan in 2020 12/12/2019   Pain 01/17/2019   Hyperlipidemia associated with type 2 diabetes mellitus ([place]) 11/07/2018   Osteopenia 11/07/2018   Seasonal and perennial allergic rhinitis 07/17/2018   Vitamin D deficiency disease 05/19/2015   Medication management 05/19/2015   Migraine 02/19/2015   Vertigo 02/19/2015   History of colonic polyps 01/15/2015   IBS (irritable bowel syndrome) 09/01/2014   Morbid obesity ([place]) 07/01/2014   CKD stage 2 due to type 2 diabetes mellitus ([place]) 07/01/2014   Fatty liver    Arthritis    Hypothyroidism 12/15/2010   T2_NIDDM w/CKD ([place]) 12/15/2010   Depression, major, recurrent, in remission ([place]) 12/15/2010   Obstructive sleep apnea 12/15/2010   Essential hypertension 12/15/2010   GERD 12/15/2010    Screening Tests Immunization History  Administered Date(s) Administered   Influenza, High Dose Seasonal PF 08/10/2015, 08/10/2016, 08/28/2017, 11/07/2018, 08/07/2019, 09/21/2020, 07/25/2021, 09/26/2022   Influenza-Unspecified 08/05/2019   PFIZER(Purple Top)SARS-COV-2 Vaccination 11/19/2019, 12/10/2019, 08/05/2020    Pneumococcal Conjugate-13 07/01/2014   Pneumococcal Polysaccharide-23 10/09/2002, 05/26/2008, 12/13/2016   Td 10/09/2002, 10/30/2010   Tdap 08/10/2015   Zoster Recombinat (Shingrix) 07/25/2021   Zoster, Live 06/20/2011   Health Maintenance  Topic Date Due   Zoster Vaccines- Shingrix (2 of 2) 09/19/2021   COVID-19 Vaccine (4 - 2023-24 season) 06/30/2022   OPHTHALMOLOGY EXAM  12/14/2022   FOOT EXAM  12/22/2022   COLONOSCOPY (Pts 45-63yr Insurance coverage will need to be confirmed)  01/21/2023   DEXA SCAN  02/23/2023   MAMMOGRAM  03/08/2023   Diabetic kidney evaluation - Urine ACR  06/27/2023   HEMOGLOBIN A1C  07/04/2023   Diabetic kidney evaluation - eGFR measurement  01/01/2024   Medicare Annual Wellness (AWV)  01/01/2024   DTaP/Tdap/Td (4 - Td or Tdap) 08/09/2025   Pneumonia Vaccine 72 Years old  Completed   INFLUENZA VACCINE  Completed  Hepatitis C Screening  Completed   HPV VACCINES  Aged Out   Shingrix: got first at [facility] 07/25/2021, pending second   Pap: 2009 remote declines another MGM: 02/2022 DEXA: At [place], last 01/2021, R fem neck T-1.9 Will completed with mammogram next month 01/2023  Colonoscopy: 12/2019 Dr. Sabino Gasser, 3 year recall - due has scheduled.   EGD: 10/2014 + gastritis  CT coronary 07/2019 -CAD-RADS 1. Minimal non-obstructive CAD (0-24%).  aortic atherosclerosis  Names of Other Physician/Practitioners you currently use: 1. [facility]- here for primary care 2. Dr. [place], eye doctor, last visit this week 11/2021, report requested 3. Dr. Betsey Holiday, dentist, last visit 2023, goes q 6 months  Patient Care Team: Unk Pinto, MD as PCP - General (Internal Medicine) Jola Baptist, DC as Anesthesiologist (Chiropractic Medicine)   Allergies Allergies  Allergen Reactions   Floxin [Ofloxacin] Hives   Invokana [Canagliflozin]     Hematuria- avoid class   Viberzi [Eluxadoline] Hives    SURGICAL HISTORY She  has  a past surgical history that includes Total knee arthroplasty; Cholecystectomy; Abdominal hysterectomy; Cesarean section; Tonsillectomy; Knee arthroscopy; Carpal tunnel release; Hernia repair (07/24/11); Joint replacement; Adenoidectomy; Sinoscopy; Cataract extraction, bilateral; and Colonoscopy. FAMILY HISTORY Her family history includes Breast cancer in her mother; Colon cancer in her son; Esophageal cancer (age of onset: 17) in her son; Hyperlipidemia in her father and sister; Hypertension in her father; Prostate cancer in her father; Stomach cancer in her son. SOCIAL HISTORY She  reports that she has never smoked. She has never used smokeless tobacco. She reports current alcohol use. She reports that she does not use drugs.  MEDICARE WELLNESS OBJECTIVES: Physical activity:   Cardiac risk factors:   Depression/mood screen:      01/01/2023    3:56 PM  Depression screen PHQ 2/9  Decreased Interest 0  Down, Depressed, Hopeless 0  PHQ - 2 Score 0    ADLs:     01/01/2023    3:55 PM  In your present state of health, do you have any difficulty performing the following activities:  Hearing? 1  Comment Has hearing aide for left ear  Vision? 0  Difficulty concentrating or making decisions? 0  Walking or climbing stairs? 0  Dressing or bathing? 0  Doing errands, shopping? 0  Preparing Food and eating ? N  Using the Toilet? N  In the past six months, have you accidently leaked urine? N  Do you have problems with loss of bowel control? N  Managing your Medications? N  Managing your Finances? N  Housekeeping or managing your Housekeeping? N     Cognitive Testing  Alert? Yes  Normal Appearance?Yes  Oriented to person? Yes  Place? Yes   Time? Yes  Recall of three objects?  Yes  Can perform simple calculations? Yes  Displays appropriate judgment?Yes  Can read the correct time from a watch face?Yes  EOL planning: Does Patient Have a Medical Advance Directive?: Yes Type of Advance  Directive: Living will   Review of Systems:  Review of Systems  Constitutional: Negative.  Negative for malaise/fatigue and weight loss.  HENT:  Negative for congestion, ear discharge, ear pain, hearing loss, nosebleeds, sore throat and tinnitus.   Eyes: Negative.  Negative for blurred vision and double vision.  Respiratory: Negative.  Negative for cough, sputum production, shortness of breath, wheezing and stridor.   Cardiovascular: Negative.  Negative for chest pain, palpitations, orthopnea, claudication, leg swelling and PND.  Gastrointestinal:  Negative  for abdominal pain, blood in stool, constipation, diarrhea, heartburn, melena, nausea and vomiting.  Genitourinary: Negative.   Musculoskeletal: Negative.  Negative for falls, joint pain and myalgias.  Skin: Negative.  Negative for rash.  Neurological:  Negative for dizziness, tingling, tremors, sensory change, speech change, focal weakness, seizures, loss of consciousness, weakness and headaches (improved, occasional sinus heachache).  Endo/Heme/Allergies:  Positive for environmental allergies. Negative for polydipsia.  Psychiatric/Behavioral: Negative.  Negative for depression, memory loss, substance abuse and suicidal ideas. The patient is not nervous/anxious and does not have insomnia.   All other systems reviewed and are negative.    Objective:   Today's Vitals   01/01/23 1503  BP: 110/70  Pulse: 88  Resp: 16  Temp: 97.9 F (36.6 C)  SpO2: 99%  Weight: 197 lb 9.6 oz (89.6 kg)  Height: '5\' 3"'$  (1.6 m)    General appearance: alert, no distress, WD/WN,  female HEENT: normocephalic, sclerae anicteric, bil ext canals clear, TMs intact without erythema or bulging, nares patent, no discharge or erythema, pharynx normal Oral cavity: MMM, no lesions Neck: supple, no lymphadenopathy, no thyromegaly, no masses Heart: RRR, normal S1, S2, no murmurs Lungs: CTA bilaterally, no wheezes, rhonchi, or rales Abdomen: +bs, soft, non  tender, non distended, no masses, no hepatomegaly, no splenomegaly Musculoskeletal: nontender, no swelling, no obvious deformity Extremities: no edema, no cyanosis, no clubbing Pulses: 2+ symmetric, upper and lower extremities, normal cap refill Neurological: alert, oriented x 3, CN2-12 intact, strength normal upper extremities and lower extremities, sensation normal throughout, DTRs 2+ throughout, no cerebellar signs, gait normal Psychiatric: normal affect, behavior normal, pleasant  Breast: defer Gyn: defer Rectal: defer  Medicare Attestation I have personally reviewed: The patient's medical and social history Their use of alcohol, tobacco or illicit drugs Their current medications and supplements The patient's functional ability including ADLs,fall risks, home safety risks, cognitive, and hearing and visual impairment Diet and physical activities Evidence for depression or mood disorders  The patient's weight, height, BMI, and visual acuity have been recorded in the chart.  I have made referrals, counseling, and provided education to the patient based on review of the above and I have provided the patient with a written personalized care plan for preventive services.     Darrol Jump, NP

## 2023-01-02 ENCOUNTER — Other Ambulatory Visit: Payer: Self-pay | Admitting: Internal Medicine

## 2023-01-02 DIAGNOSIS — E039 Hypothyroidism, unspecified: Secondary | ICD-10-CM

## 2023-01-02 LAB — COMPLETE METABOLIC PANEL WITH GFR
AG Ratio: 1.9 (calc) (ref 1.0–2.5)
ALT: 26 U/L (ref 6–29)
AST: 21 U/L (ref 10–35)
Albumin: 4.6 g/dL (ref 3.6–5.1)
Alkaline phosphatase (APISO): 83 U/L (ref 37–153)
BUN: 11 mg/dL (ref 7–25)
CO2: 26 mmol/L (ref 20–32)
Calcium: 9.9 mg/dL (ref 8.6–10.4)
Chloride: 96 mmol/L — ABNORMAL LOW (ref 98–110)
Creat: 0.93 mg/dL (ref 0.60–1.00)
Globulin: 2.4 g/dL (calc) (ref 1.9–3.7)
Glucose, Bld: 71 mg/dL (ref 65–99)
Potassium: 4.4 mmol/L (ref 3.5–5.3)
Sodium: 135 mmol/L (ref 135–146)
Total Bilirubin: 0.5 mg/dL (ref 0.2–1.2)
Total Protein: 7 g/dL (ref 6.1–8.1)
eGFR: 65 mL/min/{1.73_m2} (ref >=60)

## 2023-01-02 LAB — CBC WITH DIFFERENTIAL/PLATELET
Absolute Monocytes: 857 cells/uL (ref 200–950)
Basophils Absolute: 76 cells/uL (ref 0–200)
Basophils Relative: 0.6 %
Eosinophils Absolute: 227 cells/uL (ref 15–500)
Eosinophils Relative: 1.8 %
HCT: 43.7 % (ref 35.0–45.0)
Hemoglobin: 14.4 g/dL (ref 11.7–15.5)
Lymphs Abs: 3881 cells/uL (ref 850–3900)
MCH: 28.6 pg (ref 27.0–33.0)
MCHC: 33 g/dL (ref 32.0–36.0)
MCV: 86.7 fL (ref 80.0–100.0)
MPV: 11.2 fL (ref 7.5–12.5)
Monocytes Relative: 6.8 %
Neutro Abs: 7560 cells/uL (ref 1500–7800)
Neutrophils Relative %: 60 %
Platelets: 331 10*3/uL (ref 140–400)
RBC: 5.04 10*6/uL (ref 3.80–5.10)
RDW: 13.6 % (ref 11.0–15.0)
Total Lymphocyte: 30.8 %
WBC: 12.6 10*3/uL — ABNORMAL HIGH (ref 3.8–10.8)

## 2023-01-02 LAB — TSH: TSH: 0.67 mIU/L (ref 0.40–4.50)

## 2023-01-02 LAB — LIPID PANEL
Cholesterol: 103 mg/dL (ref <200)
HDL: 40 mg/dL — ABNORMAL LOW (ref >=50)
LDL Cholesterol (Calc): 40 mg/dL (calc)
Non-HDL Cholesterol (Calc): 63 mg/dL (calc) (ref <130)
Total CHOL/HDL Ratio: 2.6 (calc) (ref <5.0)
Triglycerides: 148 mg/dL (ref <150)

## 2023-01-02 LAB — HEMOGLOBIN A1C
Hgb A1c MFr Bld: 6.1 % of total Hgb — ABNORMAL HIGH (ref <5.7)
Mean Plasma Glucose: 128 mg/dL
eAG (mmol/L): 7.1 mmol/L

## 2023-01-02 LAB — VITAMIN D 25 HYDROXY (VIT D DEFICIENCY, FRACTURES): Vit D, 25-Hydroxy: 80 ng/mL (ref 30–100)

## 2023-01-04 ENCOUNTER — Encounter: Payer: Self-pay | Admitting: Allergy & Immunology

## 2023-01-04 ENCOUNTER — Other Ambulatory Visit: Payer: Self-pay

## 2023-01-04 ENCOUNTER — Ambulatory Visit (INDEPENDENT_AMBULATORY_CARE_PROVIDER_SITE_OTHER): Payer: Medicare HMO | Admitting: Allergy & Immunology

## 2023-01-04 VITALS — BP 112/70 | HR 101 | Temp 97.2°F | Resp 16 | Wt 194.0 lb

## 2023-01-04 DIAGNOSIS — R21 Rash and other nonspecific skin eruption: Secondary | ICD-10-CM

## 2023-01-04 DIAGNOSIS — Z889 Allergy status to unspecified drugs, medicaments and biological substances status: Secondary | ICD-10-CM

## 2023-01-04 NOTE — Progress Notes (Signed)
FOLLOW UP  Date of Service/Encounter:  01/04/23   Assessment:   Seasonal and perennial allergic rhinitis - stable on allergen immunotherapy   Frontal headaches - resolved with antibiotics   Multiple antibiotic allergy labels - passed doxycycline challenge  Ofloxacin allergy label - but tolerates levofloxacin and ciprofloxacin without a problem  Plan/Recommendations:   Doxycycline allergy - You passed your doxycycline challenge!  - Call us with any issues over the next 2-3 days. - We will see you for the next drug challenge.  - My number is [phone].  Follow up as scheduled.  Subjective:   Grace Barry is a 72 y.o. female presenting today for follow up of  Chief Complaint  Patient presents with   Food/Drug Challenge    doxycycline    Grace Barry has a history of the following: Patient Active Problem List   Diagnosis Date Noted   Hypoxemia associated with sleep 10/02/2020   Leukocytosis 09/27/2020   Tremor 07/28/2020   Uncontrolled REM sleep behavior disorder 07/20/2020   Sleep paralysis, recurrent isolated 07/20/2020   Bad dreams 07/20/2020   Psychophysiological insomnia 07/20/2020   Aortic atherosclerosis ([place]) by Abd  CT scan in 2020 12/12/2019   Pain 01/17/2019   Hyperlipidemia associated with type 2 diabetes mellitus ([place]) 11/07/2018   Osteopenia 11/07/2018   Seasonal and perennial allergic rhinitis 07/17/2018   Vitamin D deficiency disease 05/19/2015   Medication management 05/19/2015   Migraine 02/19/2015   Vertigo 02/19/2015   History of colonic polyps 01/15/2015   IBS (irritable bowel syndrome) 09/01/2014   Morbid obesity ([place]) 07/01/2014   CKD stage 2 due to type 2 diabetes mellitus ([place]) 07/01/2014   Fatty liver    Arthritis    Hypothyroidism 12/15/2010   T2_NIDDM w/CKD ([place]) 12/15/2010   Depression, major, recurrent, in remission ([place]) 12/15/2010   Obstructive sleep apnea 12/15/2010   Essential hypertension 12/15/2010   GERD  12/15/2010    History obtained from: chart review and patient.  Grace Barry is a 72 y.o. female presenting for a drug challenge.  She was last seen a few weeks ago when we did the penicillin challenge and she did great.   She is here today for a doxycycline challenge. She reports hives from the doxycycline at one point but this was years ago. She did not need to go to the ED and did not require epinephrine from what she can remember.   Her last antibiotic allergy is oflaxacin, which they no longer make. She has tolerated levofloxacin and ciprofloxacin without a problem.   Otherwise, there have been no changes to her past medical history, surgical history, family history, or social history.    Review of Systems  Constitutional: Negative.  Negative for chills, fever, malaise/fatigue and weight loss.  HENT:  Negative for congestion, ear discharge, ear pain and sinus pain.   Eyes:  Negative for pain, discharge and redness.  Respiratory:  Negative for cough, sputum production, shortness of breath and wheezing.   Cardiovascular: Negative.  Negative for chest pain and palpitations.  Gastrointestinal:  Negative for abdominal pain, constipation, diarrhea, heartburn, nausea and vomiting.  Skin: Negative.  Negative for itching and rash.  Neurological:  Negative for dizziness and headaches.  Endo/Heme/Allergies:  Positive for environmental allergies. Does not bruise/bleed easily.       Objective:   Blood pressure 112/70, pulse (!) 101, temperature (!) 97.2 F (36.2 C), temperature source Temporal, resp. rate 16, weight 194 lb (88 kg), SpO2 98 %. Body mass  index is 34.37 kg/m.   Physical exam deferred since this was a skin testing appointment only.    Oral Challenge - 01/04/23 1100     Challenge Food/Drug doxycycline    Food/Drug provided by patient    BP 122/70    Pulse 108    Respirations 12    Lungs 98    Skin clear    Mouth clear    Time 0919    Dose 25 mg (1/4 tab)    Time 0950     Dose '75mg'$  (3/4 tab)             Allergy testing results were read and interpreted by myself, documented by clinical staff.      Grace Marvel, MD  [facility]

## 2023-01-04 NOTE — Patient Instructions (Addendum)
Doxycycline allergy - You passed your doxycycline challenge!  - Call us with any issues over the next 2-3 days. - My number is [phone]. - ENJOY the trip to [place]!   2. Return in about 1 year (around 01/04/2024).    Please inform us of any Emergency Department visits, hospitalizations, or changes in symptoms. Call us before going to the ED for breathing or allergy symptoms since we might be able to fit you in for a sick visit. Feel free to contact us anytime with any questions, problems, or concerns.  It was a pleasure to see you again today!  Websites that have reliable patient information: 1. American Academy of Asthma, Allergy, and Immunology: www.aaaai.org 2. Food Allergy Research and Education (FARE): foodallergy.org 3. Mothers of Asthmatics: http://www.asthmacommunitynetwork.org 4. American College of Allergy, Asthma, and Immunology: www.acaai.org   COVID-19 Vaccine Information can be found at: ShippingScam.co.uk For questions related to vaccine distribution or appointments, please email [email or web address] or call [phone].   We realize that you might be concerned about having an allergic reaction to the COVID19 vaccines. To help with that concern, WE ARE OFFERING THE COVID19 VACCINES IN OUR OFFICE! Ask the front desk for dates!     "Like" [place] on Facebook and Instagram for our latest updates!      A healthy democracy works best when [employer] participate! Make sure you are registered to vote! If you have moved or changed any of your contact information, you will need to get this updated before voting!  In some cases, you MAY be able to register to vote online: [email or web address]

## 2023-01-07 NOTE — Patient Instructions (Signed)

## 2023-01-10 DIAGNOSIS — G4733 Obstructive sleep apnea (adult) (pediatric): Secondary | ICD-10-CM | POA: Diagnosis not present

## 2023-01-10 DIAGNOSIS — R0902 Hypoxemia: Secondary | ICD-10-CM | POA: Diagnosis not present

## 2023-01-15 ENCOUNTER — Encounter: Payer: Self-pay | Admitting: Nurse Practitioner

## 2023-01-15 ENCOUNTER — Ambulatory Visit (INDEPENDENT_AMBULATORY_CARE_PROVIDER_SITE_OTHER): Payer: Medicare HMO | Admitting: *Deleted

## 2023-01-15 DIAGNOSIS — J309 Allergic rhinitis, unspecified: Secondary | ICD-10-CM | POA: Diagnosis not present

## 2023-01-16 ENCOUNTER — Ambulatory Visit: Payer: Medicare HMO

## 2023-01-16 ENCOUNTER — Telehealth: Payer: Self-pay | Admitting: *Deleted

## 2023-01-16 ENCOUNTER — Other Ambulatory Visit: Payer: Self-pay | Admitting: Nurse Practitioner

## 2023-01-16 DIAGNOSIS — E039 Hypothyroidism, unspecified: Secondary | ICD-10-CM

## 2023-01-16 DIAGNOSIS — Z79899 Other long term (current) drug therapy: Secondary | ICD-10-CM

## 2023-01-16 NOTE — Telephone Encounter (Signed)
Patient came in yesterday stating that you told her she could go back to schedule C as she builds back up to her maintenance dose, then she will be every 4 weeks. Her flow sheet currently has her on schedule A, I just wanted to verify that it is ok and I will switch her back to schedule C in her flow sheet and inform her at her next injection.

## 2023-01-16 NOTE — Telephone Encounter (Signed)
Dr. Silverio Decamp,  While doing this pt's PV today, she stated that she wears oxygen 2 Liters per N/C every night.  She states she has been wearing this for over a year.  Would you like an OV with her or is she ok for direct to the hospital?  Thank you, Cyril Mourning

## 2023-01-17 NOTE — Telephone Encounter (Signed)
That is correct 

## 2023-01-18 NOTE — Telephone Encounter (Signed)
Patient's allergy flow sheet has been updated to reflect these changes. I called and informed the patient, patient verbalized understanding.

## 2023-01-23 NOTE — Telephone Encounter (Signed)
Attempted to contact patient .  Left voicemail to return call , have direct phone number .

## 2023-01-23 NOTE — Telephone Encounter (Signed)
Dr. Silverio Decamp,    I am rerouting a previous message sent on this pt she wears 2L via [place] at bedtime for over the last year.   Would you like an OV or direct to hospital?  Thank you

## 2023-01-23 NOTE — Telephone Encounter (Signed)
Please schedule office follow-up visit next available appointment with me or APP.  Thank you

## 2023-01-23 NOTE — Telephone Encounter (Signed)
Patient returned call.  Patient scheduled for pre-colonoscopy office appt with Janett Billow 03/08/23 at 2 pm

## 2023-01-23 NOTE — Telephone Encounter (Signed)
Inbound call from patient is requesting follow up with a nurse to discuss further steps for scheduling a procedure.

## 2023-01-29 ENCOUNTER — Ambulatory Visit (INDEPENDENT_AMBULATORY_CARE_PROVIDER_SITE_OTHER): Payer: Medicare HMO | Admitting: *Deleted

## 2023-01-29 DIAGNOSIS — J309 Allergic rhinitis, unspecified: Secondary | ICD-10-CM

## 2023-01-31 ENCOUNTER — Other Ambulatory Visit: Payer: Self-pay | Admitting: Nurse Practitioner

## 2023-01-31 DIAGNOSIS — E039 Hypothyroidism, unspecified: Secondary | ICD-10-CM

## 2023-02-07 ENCOUNTER — Ambulatory Visit (INDEPENDENT_AMBULATORY_CARE_PROVIDER_SITE_OTHER): Payer: Medicare HMO

## 2023-02-07 DIAGNOSIS — J309 Allergic rhinitis, unspecified: Secondary | ICD-10-CM

## 2023-02-10 DIAGNOSIS — G4733 Obstructive sleep apnea (adult) (pediatric): Secondary | ICD-10-CM | POA: Diagnosis not present

## 2023-02-10 DIAGNOSIS — R0902 Hypoxemia: Secondary | ICD-10-CM | POA: Diagnosis not present

## 2023-02-14 ENCOUNTER — Encounter: Payer: Medicare HMO | Admitting: Gastroenterology

## 2023-02-17 ENCOUNTER — Encounter: Payer: Self-pay | Admitting: Nurse Practitioner

## 2023-02-19 ENCOUNTER — Ambulatory Visit (INDEPENDENT_AMBULATORY_CARE_PROVIDER_SITE_OTHER): Payer: Medicare HMO

## 2023-02-19 ENCOUNTER — Other Ambulatory Visit (HOSPITAL_COMMUNITY): Payer: Self-pay

## 2023-02-19 DIAGNOSIS — J309 Allergic rhinitis, unspecified: Secondary | ICD-10-CM | POA: Diagnosis not present

## 2023-02-19 MED ORDER — MOUNJARO 15 MG/0.5ML ~~LOC~~ SOAJ
15.0000 mg | SUBCUTANEOUS | 1 refills | Status: DC
  Filled 2023-02-19: qty 2, 28d supply, fill #0
  Filled 2023-03-19: qty 2, 28d supply, fill #1

## 2023-02-20 ENCOUNTER — Ambulatory Visit (INDEPENDENT_AMBULATORY_CARE_PROVIDER_SITE_OTHER): Payer: Medicare HMO

## 2023-02-20 DIAGNOSIS — Z79899 Other long term (current) drug therapy: Secondary | ICD-10-CM | POA: Diagnosis not present

## 2023-02-20 DIAGNOSIS — E039 Hypothyroidism, unspecified: Secondary | ICD-10-CM | POA: Diagnosis not present

## 2023-02-20 NOTE — Progress Notes (Signed)
Patient presents to the office to have labs done to check a TSH and CBC.

## 2023-02-21 LAB — CBC WITH DIFFERENTIAL/PLATELET
Absolute Monocytes: 734 cells/uL (ref 200–950)
Basophils Absolute: 76 cells/uL (ref 0–200)
Basophils Relative: 0.7 %
Eosinophils Absolute: 680 cells/uL — ABNORMAL HIGH (ref 15–500)
Eosinophils Relative: 6.3 %
HCT: 41.8 % (ref 35.0–45.0)
Hemoglobin: 13.6 g/dL (ref 11.7–15.5)
Lymphs Abs: 3456 cells/uL (ref 850–3900)
MCH: 28.5 pg (ref 27.0–33.0)
MCHC: 32.5 g/dL (ref 32.0–36.0)
MCV: 87.4 fL (ref 80.0–100.0)
MPV: 11.1 fL (ref 7.5–12.5)
Monocytes Relative: 6.8 %
Neutro Abs: 5854 cells/uL (ref 1500–7800)
Neutrophils Relative %: 54.2 %
Platelets: 342 10*3/uL (ref 140–400)
RBC: 4.78 10*6/uL (ref 3.80–5.10)
RDW: 13.4 % (ref 11.0–15.0)
Total Lymphocyte: 32 %
WBC: 10.8 10*3/uL (ref 3.8–10.8)

## 2023-02-21 LAB — TSH: TSH: 0.06 mIU/L — ABNORMAL LOW (ref 0.40–4.50)

## 2023-02-22 ENCOUNTER — Other Ambulatory Visit: Payer: Self-pay | Admitting: Nurse Practitioner

## 2023-02-22 DIAGNOSIS — R7989 Other specified abnormal findings of blood chemistry: Secondary | ICD-10-CM

## 2023-02-22 DIAGNOSIS — Z79899 Other long term (current) drug therapy: Secondary | ICD-10-CM

## 2023-02-22 DIAGNOSIS — E039 Hypothyroidism, unspecified: Secondary | ICD-10-CM

## 2023-03-05 ENCOUNTER — Ambulatory Visit (INDEPENDENT_AMBULATORY_CARE_PROVIDER_SITE_OTHER): Payer: Medicare HMO | Admitting: *Deleted

## 2023-03-05 DIAGNOSIS — J309 Allergic rhinitis, unspecified: Secondary | ICD-10-CM | POA: Diagnosis not present

## 2023-03-07 ENCOUNTER — Ambulatory Visit (INDEPENDENT_AMBULATORY_CARE_PROVIDER_SITE_OTHER): Payer: Medicare HMO

## 2023-03-07 DIAGNOSIS — Z79899 Other long term (current) drug therapy: Secondary | ICD-10-CM | POA: Diagnosis not present

## 2023-03-07 DIAGNOSIS — R7989 Other specified abnormal findings of blood chemistry: Secondary | ICD-10-CM

## 2023-03-07 DIAGNOSIS — E039 Hypothyroidism, unspecified: Secondary | ICD-10-CM

## 2023-03-07 NOTE — Progress Notes (Signed)
Patient presents to the office for a nurse visit to have labs done to recheck TSH. Currently taking every 3rd day.

## 2023-03-08 ENCOUNTER — Ambulatory Visit (INDEPENDENT_AMBULATORY_CARE_PROVIDER_SITE_OTHER): Payer: Medicare HMO | Admitting: Gastroenterology

## 2023-03-08 ENCOUNTER — Encounter: Payer: Self-pay | Admitting: Gastroenterology

## 2023-03-08 VITALS — BP 108/64 | HR 65 | Ht 63.0 in | Wt 181.0 lb

## 2023-03-08 DIAGNOSIS — Z860100 Personal history of colon polyps, unspecified: Secondary | ICD-10-CM

## 2023-03-08 DIAGNOSIS — Z9981 Dependence on supplemental oxygen: Secondary | ICD-10-CM

## 2023-03-08 LAB — TSH: TSH: 2.51 mIU/L (ref 0.40–4.50)

## 2023-03-08 MED ORDER — NA SULFATE-K SULFATE-MG SULF 17.5-3.13-1.6 GM/177ML PO SOLN: 1.0000 | Freq: Once | ORAL | 0 refills | Status: AC

## 2023-03-08 NOTE — Patient Instructions (Addendum)
You have been scheduled for a colonoscopy. Please follow written instructions given to you at your visit today.  Please pick up your prep supplies at the pharmacy within the next 1-3 days. If you use inhalers (even only as needed), please bring them with you on the day of your procedure.  _______________________________________________________  If your blood pressure at your visit was 140/90 or greater, please contact your primary care physician to follow up on this.  _______________________________________________________  If you are age 74 or older, your body mass index should be between 23-30. Your Body mass index is 32.06 kg/m. If this is out of the aforementioned range listed, please consider follow up with your Primary Care Provider.  If you are age 9 or younger, your body mass index should be between 19-25. Your Body mass index is 32.06 kg/m. If this is out of the aformentioned range listed, please consider follow up with your Primary Care Provider.   ________________________________________________________  The [facility] providers would like to encourage you to use [facility] to communicate with providers for non-urgent requests or questions.  Due to long hold times on the telephone, sending your provider a message by [facility] may be a faster and more efficient way to get a response.  Please allow 48 business hours for a response.  Please remember that this is for non-urgent requests.  _______________________________________________________

## 2023-03-08 NOTE — Progress Notes (Signed)
03/08/2023 Grace Barry [id number] 1951/08/18   HISTORY OF PRESENT ILLNESS:  This is a 72 year old female who is a patient of Dr. Elana Alm.  She is here today to discuss and schedule her colonoscopy.    Colonoscopy 12/2019:  - One less than 1 mm polyp in the cecum, removed with a cold biopsy forceps. Resected and retrieved. - Five 5 to 11 mm polyps in the sigmoid colon, in the transverse colon and in the ascending colon, removed with a cold snare. Resected and retrieved. - Non-bleeding internal hemorrhoids. - The examination was otherwise normal.   1. Surgical [P], ascending, cecum and ileocecal valve, polyp (5) - TUBULAR ADENOMA(S). - HIGH GRADE DYSPLASIA IS NOT IDENTIFIED. 2. Surgical [P], colon, sigmoid, polyp - TUBULAR ADENOMA(S). - HIGH GRADE DYSPLASIA IS NOT IDENTIFIED.  Repeat was recommended in 3 years.  She is here today because now she is on O2 at nighttime due to "shallow breathing" while she sleeps.  No other new medical issues.  Has alternating bowel habits due to IBS but nothing new/changed/different with that.  No rectal bleeding.   Past Medical History:  Diagnosis Date   Allergy    Anxiety    Arthritis    Cataract    Chronic kidney disease (CKD), stage II (mild) 07/01/2014   Overview:  Overview:  Due to DM, last GFR 64    Degenerative tear of triangular fibrocartilage complex (TFCC) of left wrist 01/17/2019   Depression    Fibromyalgia    GERD (gastroesophageal reflux disease)    Hyperlipidemia    Hypertension    Hypoxemia    IBS (irritable bowel syndrome)    Liver disease    Migraine 02/19/2015   Migraines    Obesity    Osteoporosis    Oxygen deficiency    wears oxygen at noc- 2 liters   Sensorineural hearing loss (SNHL) of left ear with unrestricted hearing of right ear 12/31/2017   Sleep apnea    uses oral device none   Sleep difficulties    Thyroid disease    hypothyroidism   Tuberculosis    positive PPD test, chest x ray negative   Type  II or unspecified type diabetes mellitus without mention of complication, not stated as uncontrolled    Urticaria    Ventral hernia    Vertigo 02/19/2015   Past Surgical History:  Procedure Laterality Date   ABDOMINAL HYSTERECTOMY     ADENOIDECTOMY     CARPAL TUNNEL RELEASE     bilateral   CATARACT EXTRACTION, BILATERAL     CESAREAN SECTION     x2   CHOLECYSTECTOMY     COLONOSCOPY     HERNIA REPAIR  07/24/11   ventral hernia   JOINT REPLACEMENT     bilateral knees   KNEE ARTHROSCOPY     bilateral   SINOSCOPY     TONSILLECTOMY     TOTAL KNEE ARTHROPLASTY     bilateral    reports that she has never smoked. She has never used smokeless tobacco. She reports current alcohol use. She reports that she does not use drugs. family history includes Breast cancer in her mother; Colon cancer in her son; Esophageal cancer in her son; Hyperlipidemia in her father and sister; Hypertension in her father; Prostate cancer in her father; Stomach cancer in her son. Allergies  Allergen Reactions   Floxin [Ofloxacin] Hives   Invokana [Canagliflozin]     Hematuria- avoid class   Viberzi [Eluxadoline]  Hives      Outpatient Encounter Medications as of 03/08/2023  Medication Sig   aspirin 81 MG tablet Take 81 mg by mouth daily.   azelastine (ASTELIN) 0.1 % nasal spray Place 1 spray into both nostrils 2 (two) times daily. Use in each nostril as directed   baclofen (LIORESAL) 10 MG tablet TAKE 1 TABLET BY MOUTH TWICE A DAY AS NEEDED FOR MUSCLE SPASMS   CALCIUM PO Take 1,200 mg by mouth daily.   Cyanocobalamin (VITAMIN B12 SL) Place 1 tablet under the tongue daily.   DULoxetine (CYMBALTA) 60 MG capsule TAKE 1 CAPSULE DAILY FOR CHRONIC PAIN SYNDROME.   EPINEPHrine 0.3 mg/0.3 mL IJ SOAJ injection Inject 0.3 mg into the muscle once.   famotidine (PEPCID) 40 MG tablet TAKE 1 TABLET EVERY 12 HOURS TO PREVENT HEARTBURN & INDIGESTION   fluticasone (FLONASE) 50 MCG/ACT nasal spray Place 1-2 sprays into both  nostrils at bedtime.   glucose blood (ONE TOUCH ULTRA TEST) test strip Check Blood sugar Daily for diabetes   Lancets (ONETOUCH ULTRASOFT) lancets Check Blood sugar  daily   levothyroxine (SYNTHROID) 150 MCG tablet TAKE 1 TABLET DAILY ON AN EMPTY STOMACH WITH ONLY WATER FOR 30 MINUTES & NO ANTACID MEDS, CALCIUM OR MAGNESIUM FOR 4 HOURS & AVOID BIOTIN   loperamide (IMODIUM) 2 MG capsule Take 1 capsule (2 mg total) by mouth as needed for diarrhea or loose stools.   losartan (COZAAR) 100 MG tablet TAKE 1 TABLET BY MOUTH DAILY FOR BLOOD PRESSURE AND KIDNEY PROTECTION (Patient taking differently: 50 mg daily. TAKE 1 TABLET BY MOUTH DAILY FOR BLOOD PRESSURE AND KIDNEY PROTECTION)   Magnesium 250 MG TABS Take 250 mg by mouth 2 (two) times daily.   metFORMIN (GLUCOPHAGE-XR) 500 MG 24 hr tablet Take  2 tablets   2 x /day   with Meals for Diabetes   metoprolol succinate (TOPROL-XL) 50 MG 24 hr tablet Take 1 tablet (50 mg total) by mouth daily. Take with or immediately following a meal.   montelukast (SINGULAIR) 10 MG tablet TAKE 1 TABLET BY MOUTH EVERYDAY AT BEDTIME   Multiple Vitamins-Minerals (MULTIVITAMIN PO) Take 1 tablet by mouth daily.    nortriptyline (PAMELOR) 10 MG capsule TAKE 1 CAPSULE BY MOUTH EVERYDAY AT BEDTIME   Omega-3 Fatty Acids (FISH OIL PO) Take by mouth daily.   omeprazole (PRILOSEC) 20 MG capsule TAKE 1 CAPSULE DAILY TO PREVENT HEARTBURN & ACID REFLUX   PRESCRIPTION MEDICATION Inject as directed every 30 (thirty) days. Allergy Shot   rosuvastatin (CRESTOR) 10 MG tablet TAKE 1 TABLET BY MOUTH EVERY DAY FOR CHOLESTEROL   SHINGRIX injection    tirzepatide (MOUNJARO) 15 MG/0.5ML Pen Inject  1 pen (15 mg) into Skin every 7 days  for Diabetes  (Dx:  e11.29)   tirzepatide (MOUNJARO) 15 MG/0.5ML Pen Inject 15 mg into the skin every 7 (seven) days.   Turmeric (QC TUMERIC COMPLEX PO) Take by mouth.   Vitamin D, Ergocalciferol, (DRISDOL) 1.25 MG (50000 UNIT) CAPS capsule Take 1 capsule 1 x /week  for Vit D Deficiency   [DISCONTINUED] doxycycline (DORYX) 100 MG EC tablet Please bring doxycyline tablets to the office for an observed challenge to remove this from your allergy list.   No facility-administered encounter medications on file as of 03/08/2023.     REVIEW OF SYSTEMS  : All other systems reviewed and negative except where noted in the History of Present Illness.   PHYSICAL EXAM: BP 108/64   Pulse 65  Ht 5\' 3"  (1.6 m)   Wt 181 lb (82.1 kg)   BMI 32.06 kg/m  General: Well developed white female in no acute distress Head: Normocephalic and atraumatic Eyes:  Sclerae anicteric, conjunctiva pink. Ears: Normal auditory acuity Lungs: Clear throughout to auscultation; no W/R/R. Heart: Regular rate and rhythm; no M/R/G. Abdomen: Soft, non-distended.  BS present.  Non-tender. Rectal:  Will be done at the time of colonoscopy. Musculoskeletal: Symmetrical with no gross deformities  Skin: No lesions on visible extremities Extremities: No edema  Neurological: Alert oriented x 4, grossly non-focal Psychological:  Alert and cooperative. Normal mood and affect  ASSESSMENT AND PLAN: *Personal history of adenomatous colon polyps:  Last colonoscopy 12/2019 with repeat recommended in 3 years.  Will schedule with Dr. Lavon Paganini at [facility] hospital due to nighttime O2 use. *Nighttime O2 use  **The risks, benefits, and alternatives to colonoscopy were discussed with the patient and she consents to proceed.  CC:  Lucky Cowboy, MD

## 2023-03-12 ENCOUNTER — Ambulatory Visit (INDEPENDENT_AMBULATORY_CARE_PROVIDER_SITE_OTHER): Payer: Medicare HMO | Admitting: *Deleted

## 2023-03-12 DIAGNOSIS — J309 Allergic rhinitis, unspecified: Secondary | ICD-10-CM | POA: Diagnosis not present

## 2023-03-12 DIAGNOSIS — G4733 Obstructive sleep apnea (adult) (pediatric): Secondary | ICD-10-CM | POA: Diagnosis not present

## 2023-03-12 DIAGNOSIS — R0902 Hypoxemia: Secondary | ICD-10-CM | POA: Diagnosis not present

## 2023-03-14 NOTE — Progress Notes (Signed)
Reviewed and agree with documentation and assessment and plan. K. Veena Sharla Tankard , MD   

## 2023-03-20 ENCOUNTER — Other Ambulatory Visit (HOSPITAL_COMMUNITY): Payer: Self-pay

## 2023-03-21 ENCOUNTER — Other Ambulatory Visit (HOSPITAL_COMMUNITY): Payer: Self-pay

## 2023-03-28 ENCOUNTER — Ambulatory Visit (INDEPENDENT_AMBULATORY_CARE_PROVIDER_SITE_OTHER): Payer: Medicare HMO

## 2023-03-28 DIAGNOSIS — J309 Allergic rhinitis, unspecified: Secondary | ICD-10-CM

## 2023-04-02 DIAGNOSIS — Z1231 Encounter for screening mammogram for malignant neoplasm of breast: Secondary | ICD-10-CM | POA: Diagnosis not present

## 2023-04-02 LAB — HM MAMMOGRAPHY

## 2023-04-03 DIAGNOSIS — M654 Radial styloid tenosynovitis [de Quervain]: Secondary | ICD-10-CM | POA: Diagnosis not present

## 2023-04-04 ENCOUNTER — Ambulatory Visit: Payer: BC Managed Care – PPO | Admitting: Internal Medicine

## 2023-04-04 NOTE — Progress Notes (Incomplete)
Future Appointments  Date Time Provider Department  04/05/2023  3:30 PM Lucky Cowboy, MD [facility]  05/22/2023  1:20 PM Little Ishikawa, MD [facility]  07/10/2023                       cpe  2:00 PM Raynelle Dick, NP [facility]  12/06/2023  2:15 PM Glean Salvo, NP [facility]  12/28/2023                       wellness 11:00 AM Adela Glimpse, NP [facility]      History of Present Illness:       This very nice 72 y.o. MWF  presents for 3 month follow up with HTN, HLD, T2_NIDDM  and Vitamin D Deficiency.        Patient is treated for HTN  since  2009 & BP has been controlled at home. Today's BP is at goal -  122/70. Patient has had no complaints of any cardiac type chest pain, palpitations, dyspnea Pollyann Kennedy /PND, dizziness, claudication or dependent edema.       Hyperlipidemia is controlled with diet & meds. Patient denies myalgias or other med SE's. Last Lipids were at goal :  Lab Results  Component Value Date   CHOL 105 06/26/2022   HDL 39 (L) 06/26/2022   LDLCALC 45 06/26/2022   TRIG 127 06/26/2022   CHOLHDL 2.7 06/26/2022     Also, the patient has history of T2_NIDDM PreDiabetes and has had no symptoms of reactive hypoglycemia, diabetic polys, paresthesias or visual blurring.  Last A1c was near goal :  Lab Results  Component Value Date   HGBA1C 5.9 (H) 06/26/2022           Patient has been on Thyroid Replacement since 2008 when dx'd Hypothyroid.                                                         Further, the patient also has history of Vitamin D Deficiency and supplements vitamin D . Last vitamin D was sl elevated :  Lab Results  Component Value Date   VD25OH 110 (H) 06/26/2022     Current Outpatient Medications on File Prior to Visit  Medication Sig  . aspirin 81 MG tablet Take  daily.  Marland Kitchen azelastine (ASTELIN) 0.1 % nasal spray Place 1 spray into both nostrils 2   times daily  . baclofen (LIORESAL) 10 MG tablet Take 1 tablet  2  times  daily as needed  . CALCIUM    1,200 mg  Take  daily.  Marland Kitchen VITAMIN B12 SL Place 1 tablet under the tongue daily.  . DULoxetine 60 MG capsule TAKE 1 CAPSULE DAILY   . EPINEPHrine 0.3 mg/0.3 mL  Inject 0.3 mg into the muscle once.  . famotidine  40 MG tablet TAKE 1 TABLET EVERY 12 HOURS  . FLONASE  nasal spray Place 1-2 sprays into both nostrils at bedtime.  Marland Kitchen loperamide  2 MG capsule Take 1 capsule as needed for diarrhea or loose stools.  Marland Kitchen losartan 100 MG tablet TAKE 1 TABLET  DAILY   . Magnesium 250 MG TABS Take 2  times daily.  . metFORMIN - XR 500 MG  Take  2 tablets   2  x /day   with Meals for Diabetes  . metoprolol succinate -XL 50 MG  Take 1 tablet  daily.   . montelukast 10 MG tablet Take 1 tablet at bedtime.  . Multiple Vitamins-Minerals  Take 1 tablet  daily.   . nortriptyline 10 MG capsule TAKE 1 CAPSULE  AT BEDTIME  . Omega-3 FISH OIL Take  daily.  Marland Kitchen omeprazole 20 MG capsule TAKE 1 CAPSULE DAILY   . Allergy Shot Inject as directed every 30  days.   . rosuvastatin  10 MG tablet TAKE 1 TABLET EVERY DAY   . Semaglutide, 1 MG/DOSE, 4 MG/3ML SOPN Inject 1 mg intoskin once a week. ( Unable to get at her pharmacy)   . SHINGRIX injection   . SYNTHROID 200 MCG tablet TAKE 1/2 TAB ON SATURDAY, TAKE 1 TAB DAILY ON THE OTHER DAYS.   Marland Kitchen traZODone (DESYREL) 50 MG tablet Take 1/2 to 1 tablet  at Bedtime                              . Turmeric  Take  . Vitamin D 50,000 u cap Take 1 capsule 1 x /week f     Allergies  Allergen Reactions  . Floxin [Ofloxacin] Hives  . Invokana [Canagliflozin]     Hematuria- avoid class  . Viberzi [Eluxadoline] Hives  . Doxycycline Rash  . Penicillins Rash     PMHx:   Past Medical History:  Diagnosis Date  . Allergy   . Anxiety   . Arthritis   . Asthma   . Cataract   . Chronic kidney disease (CKD), stage II (mild) 07/01/2014   Overview:  Overview:  Due to DM, last GFR 64   . Degenerative tear of triangular fibrocartilage complex (TFCC) of left wrist  01/17/2019  . Depression   . Fibromyalgia   . GERD (gastroesophageal reflux disease)   . Hyperlipidemia   . Hypertension   . Hypoxemia   . IBS (irritable bowel syndrome)   . Liver disease   . Migraine 02/19/2015  . Migraines   . Obesity   . Osteoporosis   . Sensorineural hearing loss (SNHL) of left ear with unrestricted hearing of right ear 12/31/2017  . Sleep apnea    uses oral device  . Sleep difficulties   . Thyroid disease    hypothyroidism  . Tuberculosis    positive PPD test, chest x ray negative  . Type II or unspecified type diabetes mellitus without mention of complication, not stated as uncontrolled   . Urticaria   . Ventral hernia   . Vertigo 02/19/2015     Immunization History  Administered Date(s) Administered  . Influenza, High Dose  11/07/2018, 08/07/2019, 09/21/2020, 07/25/2021  . Influenza 08/05/2019  . PFIZER-SARS-COV-2 Vacc 11/19/2019, 12/10/2019, 08/05/2020  . Pneumococcal -13 07/01/2014  . Pneumococcal -23 10/09/2002, 05/26/2008, 12/13/2016  . Td 10/09/2002, 10/30/2010  . Tdap 08/10/2015  . Zoster Recombinat (Shingrix) 07/25/2021  . Zoster, Live 06/20/2011     Past Surgical History:  Procedure Laterality Date  . ABDOMINAL HYSTERECTOMY    . ADENOIDECTOMY    . CARPAL TUNNEL RELEASE     bilateral  . CATARACT EXTRACTION, BILATERAL    . CESAREAN SECTION     x2  . CHOLECYSTECTOMY    . COLONOSCOPY    . HERNIA REPAIR  07/24/11   ventral hernia  . JOINT REPLACEMENT     bilateral knees  . KNEE ARTHROSCOPY  bilateral  . SINOSCOPY    . TONSILLECTOMY    . TOTAL KNEE ARTHROPLASTY     bilateral    FHx:    Reviewed / unchanged  SHx:    Reviewed / unchanged   Systems Review:  Constitutional: Denies fever, chills, wt changes, headaches, insomnia, fatigue, night sweats, change in appetite. Eyes: Denies redness, blurred vision, diplopia, discharge, itchy, watery eyes.  ENT: Denies discharge, congestion, post nasal drip, epistaxis, sore throat,  earache, hearing loss, dental pain, tinnitus, vertigo, sinus pain, snoring.  CV: Denies chest pain, palpitations, irregular heartbeat, syncope, dyspnea, diaphoresis, orthopnea, PND, claudication or edema. Respiratory: denies cough, dyspnea, DOE, pleurisy, hoarseness, laryngitis, wheezing.  Gastrointestinal: Denies dysphagia, odynophagia, heartburn, reflux, water brash, abdominal pain or cramps, nausea, vomiting, bloating, diarrhea, constipation, hematemesis, melena, hematochezia  or hemorrhoids. Genitourinary: Denies dysuria, frequency, urgency, nocturia, hesitancy, discharge, hematuria or flank pain. Musculoskeletal: Denies arthralgias, myalgias, stiffness, jt. swelling, pain, limping or strain/sprain.  Skin: Denies pruritus, rash, hives, warts, acne, eczema or change in skin lesion(s). Neuro: No weakness, tremor, incoordination, spasms, paresthesia or pain. Psychiatric: Denies confusion, memory loss or sensory loss. Endo: Denies change in weight, skin or hair change.  Heme/Lymph: No excessive bleeding, bruising or enlarged lymph nodes.  Physical Exam  There were no vitals taken for this visit.  Appears  well nourished, well groomed  and in no distress.  Eyes: PERRLA, EOMs, conjunctiva no swelling or erythema. Sinuses: No frontal/maxillary tenderness ENT/Mouth: EAC's clear, TM's nl w/o erythema, bulging. Nares clear w/o erythema, swelling, exudates. Oropharynx clear without erythema or exudates. Oral hygiene is good. Tongue normal, non obstructing. Hearing intact.  Neck: Supple. Thyroid not palpable. Car 2+/2+ without bruits, nodes or JVD. Chest: Respirations nl with BS clear & equal w/o rales, rhonchi, wheezing or stridor.  Cor: Heart sounds normal w/ regular rate and rhythm without sig. murmurs, gallops, clicks or rubs. Peripheral pulses normal and equal  without edema.  Abdomen: Soft & bowel sounds normal. Non-tender w/o guarding, rebound, hernias, masses or organomegaly.  Lymphatics:  Unremarkable.  Musculoskeletal: Full ROM all peripheral extremities, joint stability, 5/5 strength and normal gait.  Skin: Warm, dry without exposed rashes, lesions or ecchymosis apparent.  Neuro: Cranial nerves intact, reflexes equal bilaterally. Sensory-motor testing grossly intact. Tendon reflexes grossly intact.  Pysch: Alert & oriented x 3.  Insight and judgement nl & appropriate. No ideations.  Assessment and Plan:  1. Essential hypertension  - Continue medication, monitor blood pressure at home.  - Continue DASH diet.  Reminder to go to the ER if any CP,  SOB, nausea, dizziness, severe HA, changes vision/speech.   - CBC with Differential/Platelet - COMPLETE METABOLIC PANEL WITH GFR - Magnesium - TSH  2. Hy - Continue diet/meds, exercise,& lifestyle modifications.  - Continue monitor periodic cholesterol/liver & renal functions   perlipidemia associated with type 2 diabetes mellitus (HCC)  - Lipid panel - TSH  3. Type 2 diabetes mellitus with stage 2 chronic kidney  disease, without long-term current use of insulin (HCC)  - Continue diet, exercise  - Lifestyle modifications.  - Monitor appropriate labs    - Hemoglobin A1c - Insulin, random - tirzepatide (MOUNJARO) 10 MG/0.5ML Pen;  Inject 1 pen (10 mg) every 7 days for Diabetes  ( Dx:  e11.29 )   Dispense: 2 mL; Refill: 0  4. Vitamin D deficiency  - Continue supplementation   - VITAMIN D 25 Hydroxy  5. Hypothyroidism, unspecified type  - TSH  6. Aortic atherosclerosis (HCC) by  Abd  CT scan in 2020  - Lipid panel  7. Medication management  - CBC with Differential/Platelet - COMPLETE METABOLIC PANEL WITH GFR - Magnesium - Lipid panel - TSH - Hemoglobin A1c - Insulin, random - VITAMIN D 25 Hydroxy   8. Need for immunization against influenza  - Flu vaccine HIGH DOSE PF         Discussed  regular exercise, BP monitoring, weight control to achieve/maintain BMI less than 25 and discussed med and  SE's. Recommended labs to assess /monitor clinical status .  I discussed the assessment and treatment plan with the patient. The patient was provided an opportunity to ask questions and all were answered. The patient agreed with the plan and demonstrated an understanding of the instructions.  I provided over 30 minutes of exam, counseling, chart review and  complex critical decision making.        The patient was advised to call back or seek an in-person evaluation if the symptoms worsen or if the condition fails to improve as anticipated.   Marinus Maw, MD

## 2023-04-04 NOTE — Progress Notes (Signed)
Future Appointments  Date Time Provider Department  04/05/2023  3:30 PM Lucky Cowboy, MD [facility]  05/22/2023  1:20 PM Little Ishikawa, MD [facility]  07/10/2023                       cpe  2:00 PM Raynelle Dick, NP [facility]  12/06/2023  2:15 PM Glean Salvo, NP [facility]  12/28/2023                       wellness 11:00 AM Adela Glimpse, NP [facility]    History of Present Illness:       This very nice 72 y.o. MWF  presents for 3 month follow up with HTN, HLD, T2_NIDDM  and Vitamin D Deficiency.  CT scan in past revealed Aortic Atherosclerosis.        Patient is treated for HTN  since  2009 & BP has been controlled at home. Today's BP is at goal -  128/72 . Patient has had no complaints of any cardiac type chest pain, palpitations, dyspnea Pollyann Kennedy /PND, dizziness, claudication or dependent edema.       Hyperlipidemia is controlled with diet & meds. Patient denies myalgias or other med SE's. Last Lipids were at goal :  Lab Results  Component Value Date   CHOL 105 06/26/2022   HDL 39 (L) 06/26/2022   LDLCALC 45 06/26/2022   TRIG 127 06/26/2022   CHOLHDL 2.7 06/26/2022   Wt Readings from Last 3 Encounters:  04/05/23 184 lb 12.8 oz (83.8 kg)  03/08/23 181 lb (82.1 kg)  03/07/23 182 lb (82.6 kg)    Also, the patient has history of T2_NIDDM and has had no symptoms of reactive hypoglycemia, diabetic polys, paresthesias or visual blurring.  Last A1c was near goal :  Lab Results  Component Value Date   HGBA1C 5.9 (H) 06/26/2022           Patient has been on Thyroid Replacement since 2008 when dx'd Hypothyroid. Last TSH 2.51 Normal on 03/07/2023.  Patient is only taking her levothyroxine 150 mcg every 3 days  ( last dose yesterday) .                                                         Further, the patient also has history of Vitamin D Deficiency and supplements vitamin D . Last vitamin D was sl elevated :  Lab Results  Component Value Date   VD25OH  110 (H) 06/26/2022     Current Outpatient Medications  Medication Instructions   aspirin Daily   azelastine (ASTELIN) 0.1 % nasal spray 1 spray, Each Nare, 2 times daily, Use in each nostril as directed   baclofen (LIORESAL) 10 MG tablet TAKE 1 TABLET  TWICE A DAY AS NEEDED    CALCIUM   1, 200 mg Daily   VITAMIN D 5000 u Take  1 capsule Daily   VITAMIN B12 SL) 1 tablet  Daily   DULoxetine  60 MG capsule TAKE 1 CAPSULE DAILY    EPINEPHrine (EPI-PEN) 0.3 mg, Intramuscular,  Once   famotidine  40 MG tablet TAKE 1 TABLET EVERY 12 HOURS TO PREVENT HEARTBURN & INDIGESTION   FLONASE  nasal spray 1-2 sprays, Each Nare, Daily at bedtime  levothyroxine  150 MCG tablet ONLY TAKES 1 TABLET EVERY 3 DAYS    loperamide (IMODIUM) 2 mg, Oral, As needed   losartan (COZAAR) 100 MG tablet TAKE 1 TABLET  DAILY    Magnesium 250 mg, 2 times daily   metFORMIN -XR 500 MG  Take  2 tablets   2 x /day   with Meals for Diabetes   metoprolol succinate TOPROL-XL 50 mg, Oral, Daily, Take with or immediately following a meal.   montelukast 10 MG tablet TAKE 1 TABLET EVERYDAY AT BEDTIME   Mounjaro 15 mg, Subcutaneous, Every 7 days   Multiple Vitamins-Minerals  1 tablet, Oral, Daily   nortriptyline (PAMELOR) 10 MG capsule TAKE 1 CAPSULE  EVERYDAY AT BEDTIME   Omega-3 Fatty Acids (FISH OIL PO) Oral, Daily   omeprazole (PRILOSEC) 20 MG capsule TAKE 1 CAPSULE DAILY    Allergy Shot  Injection, Every 30 days,    rosuvastatin (CRESTOR) 10 MG tablet TAKE 1 TABLET  EVERY DAY FOR CHOLESTEROL   SHINGRIX injection No dose, route, or frequency recorded.   MOUNJARO 15 MG Inject  1 pen into Skin every 7 days     Turmeric  Oral     Allergies  Allergen Reactions   Floxin [Ofloxacin] Hives   Invokana [Canagliflozin]     Hematuria- avoid class   Viberzi [Eluxadoline] Hives   Doxycycline Rash   Penicillins Rash     PMHx:   Past Medical History:  Diagnosis Date   Allergy    Anxiety    Arthritis    Asthma    Cataract     Chronic kidney disease (CKD), stage II (mild) 07/01/2014   Overview:  Overview:  Due to DM, last GFR 64    Degenerative tear of triangular fibrocartilage complex (TFCC) of left wrist 01/17/2019   Depression    Fibromyalgia    GERD (gastroesophageal reflux disease)    Hyperlipidemia    Hypertension    Hypoxemia    IBS (irritable bowel syndrome)    Liver disease    Migraine 02/19/2015   Migraines    Obesity    Osteoporosis    Sensorineural hearing loss (SNHL) of left ear with unrestricted hearing of right ear 12/31/2017   Sleep apnea    uses oral device   Sleep difficulties    Thyroid disease    hypothyroidism   Tuberculosis    positive PPD test, chest x ray negative   Type II or unspecified type diabetes mellitus without mention of complication, not stated as uncontrolled    Urticaria    Ventral hernia    Vertigo 02/19/2015     Immunization History  Administered Date(s) Administered   Influenza, High Dose  11/07/2018, 08/07/2019, 09/21/2020, 07/25/2021   Influenza 08/05/2019   PFIZER-SARS-COV-2 Vacc 11/19/2019, 12/10/2019, 08/05/2020   Pneumococcal -13 07/01/2014   Pneumococcal -23 10/09/2002, 05/26/2008, 12/13/2016   Td 10/09/2002, 10/30/2010   Tdap 08/10/2015   Zoster Recombinat (Shingrix) 07/25/2021   Zoster, Live 06/20/2011     Past Surgical History:  Procedure Laterality Date   ABDOMINAL HYSTERECTOMY     ADENOIDECTOMY     CARPAL TUNNEL RELEASE     bilateral   CATARACT EXTRACTION, BILATERAL     CESAREAN SECTION     x2   CHOLECYSTECTOMY     COLONOSCOPY     HERNIA REPAIR  07/24/11   ventral hernia   JOINT REPLACEMENT     bilateral knees   KNEE ARTHROSCOPY     bilateral  SINOSCOPY     TONSILLECTOMY     TOTAL KNEE ARTHROPLASTY     bilateral    FHx:    Reviewed / unchanged  SHx:    Reviewed / unchanged   Systems Review:  Constitutional: Denies fever, chills, wt changes, headaches, insomnia, fatigue, night sweats, change in appetite. Eyes: Denies  redness, blurred vision, diplopia, discharge, itchy, watery eyes.  ENT: Denies discharge, congestion, post nasal drip, epistaxis, sore throat, earache, hearing loss, dental pain, tinnitus, vertigo, sinus pain, snoring.  CV: Denies chest pain, palpitations, irregular heartbeat, syncope, dyspnea, diaphoresis, orthopnea, PND, claudication or edema. Respiratory: denies cough, dyspnea, DOE, pleurisy, hoarseness, laryngitis, wheezing.  Gastrointestinal: Denies dysphagia, odynophagia, heartburn, reflux, water brash, abdominal pain or cramps, nausea, vomiting, bloating, diarrhea, constipation, hematemesis, melena, hematochezia  or hemorrhoids. Genitourinary: Denies dysuria, frequency, urgency, nocturia, hesitancy, discharge, hematuria or flank pain. Musculoskeletal: Denies arthralgias, myalgias, stiffness, jt. swelling, pain, limping or strain/sprain.  Skin: Denies pruritus, rash, hives, warts, acne, eczema or change in skin lesion(s). Neuro: No weakness, tremor, incoordination, spasms, paresthesia or pain. Psychiatric: Denies confusion, memory loss or sensory loss. Endo: Denies change in weight, skin or hair change.  Heme/Lymph: No excessive bleeding, bruising or enlarged lymph nodes.  Physical Exam  BP 128/72   Pulse 78   Temp 97.9 F (36.6 C)   Resp 17   Ht 5\' 3"  (1.6 m)   Wt 184 lb 12.8 oz (83.8 kg)   SpO2 96%   BMI 32.74 kg/m   Appears  well nourished, well groomed  and in no distress.  Eyes: PERRLA, EOMs, conjunctiva no swelling or erythema. Sinuses: No frontal/maxillary tenderness ENT/Mouth: EAC's clear, TM's nl w/o erythema, bulging. Nares clear w/o erythema, swelling, exudates. Oropharynx clear without erythema or exudates. Oral hygiene is good. Tongue normal, non obstructing. Hearing intact.  Neck: Supple. Thyroid not palpable. Car 2+/2+ without bruits, nodes or JVD. Chest: Respirations nl with BS clear & equal w/o rales, rhonchi, wheezing or stridor.  Cor: Heart sounds normal w/  regular rate and rhythm without sig. murmurs, gallops, clicks or rubs. Peripheral pulses normal and equal  without edema.  Abdomen: Soft & bowel sounds normal. Non-tender w/o guarding, rebound, hernias, masses or organomegaly.  Lymphatics: Unremarkable.  Musculoskeletal: Full ROM all peripheral extremities, joint stability, 5/5 strength and normal gait.  Skin: Warm, dry without exposed rashes, lesions or ecchymosis apparent.  Neuro: Cranial nerves intact, reflexes equal bilaterally. Sensory-motor testing grossly intact. Tendon reflexes grossly intact.  Pysch: Alert & oriented x 3.  Insight and judgement nl & appropriate. No ideations.  Assessment and Plan:  1. Essential hypertension  - Continue medication, monitor blood pressure at home.  - Continue DASH diet.  Reminder to go to the ER if any CP,  SOB, nausea, dizziness, severe HA, changes vision/speech.   - CBC with Differential/Platelet - COMPLETE METABOLIC PANEL WITH GFR - Magnesium - TSH   2. Hyperlipidemia associated with type 2 diabetes mellitus (HCC)  - Continue diet/meds, exercise,& lifestyle modifications.  - Continue monitor periodic cholesterol/liver & renal functions   - Lipid panel - TSH   3. Type 2 diabetes mellitus with stage 2 chronic kidney  disease, without long-term current use of insulin (HCC)  - Continue diet, exercise  - Lifestyle modifications.  - Monitor appropriate labs    - Hemoglobin A1c - Insulin, random - tirzepatide (MOUNJARO) 10 MG/0.5ML Pen;  Inject 1 pen (10 mg) every 7 days for Diabetes  ( Dx:  e11.29 )    4.  Vitamin D deficiency  - Continue supplementation   - VITAMIN D 25 Hydroxy   5. Hypothyroidism - TSH   6. Aortic atherosclerosis (HCC) by Abd  CT scan in 2020  - Lipid panel   7. Medication management  - CBC with Differential/Platelet - COMPLETE METABOLIC PANEL WITH GFR - Magnesium - Lipid panel - TSH - Hemoglobin A1c - Insulin, random - VITAMIN D 25 Hydroxy             Discussed  regular exercise, BP monitoring, weight control to achieve/maintain BMI less than 25 and discussed med and SE's. Recommended labs to assess /monitor clinical status .  I discussed the assessment and treatment plan with the patient. The patient was provided an opportunity to ask questions and all were answered. The patient agreed with the plan and demonstrated an understanding of the instructions.  I provided over 30 minutes of exam, counseling, chart review and  complex critical decision making.        The patient was advised to call back or seek an in-person evaluation if the symptoms worsen or if the condition fails to improve as anticipated.   Marinus Maw, MD

## 2023-04-05 ENCOUNTER — Ambulatory Visit (INDEPENDENT_AMBULATORY_CARE_PROVIDER_SITE_OTHER): Payer: Medicare HMO | Admitting: Internal Medicine

## 2023-04-05 ENCOUNTER — Encounter: Payer: Self-pay | Admitting: Internal Medicine

## 2023-04-05 VITALS — BP 128/72 | HR 78 | Temp 97.9°F | Resp 17 | Ht 63.0 in | Wt 184.8 lb

## 2023-04-05 DIAGNOSIS — E785 Hyperlipidemia, unspecified: Secondary | ICD-10-CM

## 2023-04-05 DIAGNOSIS — I10 Essential (primary) hypertension: Secondary | ICD-10-CM | POA: Diagnosis not present

## 2023-04-05 DIAGNOSIS — E039 Hypothyroidism, unspecified: Secondary | ICD-10-CM | POA: Diagnosis not present

## 2023-04-05 DIAGNOSIS — E1169 Type 2 diabetes mellitus with other specified complication: Secondary | ICD-10-CM | POA: Diagnosis not present

## 2023-04-05 DIAGNOSIS — Z79899 Other long term (current) drug therapy: Secondary | ICD-10-CM

## 2023-04-05 DIAGNOSIS — E559 Vitamin D deficiency, unspecified: Secondary | ICD-10-CM

## 2023-04-05 DIAGNOSIS — E1122 Type 2 diabetes mellitus with diabetic chronic kidney disease: Secondary | ICD-10-CM | POA: Diagnosis not present

## 2023-04-05 DIAGNOSIS — I700 Atherosclerosis of aorta: Secondary | ICD-10-CM

## 2023-04-05 DIAGNOSIS — N182 Chronic kidney disease, stage 2 (mild): Secondary | ICD-10-CM

## 2023-04-05 LAB — CBC WITH DIFFERENTIAL/PLATELET
Lymphs Abs: 5930 cells/uL — ABNORMAL HIGH (ref 850–3900)
MCV: 87.2 fL (ref 80.0–100.0)
Neutro Abs: 11978 cells/uL — ABNORMAL HIGH (ref 1500–7800)
Platelets: 391 10*3/uL (ref 140–400)
Total Lymphocyte: 30.1 %

## 2023-04-05 MED ORDER — VITAMIN D3 125 MCG (5000 UT) PO CAPS
ORAL_CAPSULE | ORAL | Status: AC
Start: 2023-04-05 — End: ?

## 2023-04-05 NOTE — Patient Instructions (Signed)
Due to recent changes in healthcare laws, you may see the results of your imaging and laboratory studies on MyChart before your provider has had a [place] to review them.  We understand that in some cases there may be results that are confusing or concerning to you. Not all laboratory results come back in the same time frame and the provider may be waiting for multiple results in order to interpret others.  Please give us 48 hours in order for your provider to thoroughly review all the results before contacting the office for clarification of your results.  ++++++++++++++++++++++++++  Vit D  & Vit C 1,000 mg   are recommended to help protect  against the Covid-19 and other Corona viruses.    Also it's recommended  to take  Zinc 50 mg  to help  protect against the Covid-19   and best place to get  is also on [employer].com  and don't pay more than 6-8 cents /pill !   +++++++++++++++++++++++++++++++++++++++ Recommend Adult Low Dose Aspirin or  coated  Aspirin 81 mg daily  To reduce risk of Colon Cancer 40 %,  Skin Cancer 26 % ,  Melanoma 46%  and  Pancreatic cancer 60% +++++++++++++++++++++++++++++++++++++++++ Vitamin D goal  is between 70-100.  Please make sure that you are taking your Vitamin D as directed.  It is very important as a natural anti-inflammatory  helping hair, skin, and nails, as well as reducing stroke and heart attack risk.  It helps your bones and helps with mood. It also decreases numerous cancer risks so please take it as directed.  Low Vit D is associated with a 200-300% higher risk for CANCER  and 200-300% higher risk for HEART   ATTACK  &  STROKE.   ...................................... It is also associated with higher death rate at younger ages,  autoimmune diseases like Rheumatoid arthritis, Lupus, Multiple Sclerosis.    Also many other serious conditions, like depression, Alzheimer's Dementia, infertility, muscle aches, fatigue, fibromyalgia - just to name  a few. +++++++++++++++++++++++++++++++++++++++++ Recommend the book "The END of DIETING" by Dr Joel Fuhrman  & the book "The END of DIABETES " by Dr Joel Fuhrman At [employer] - get book & Audio CD's    Being diabetic has a  300% increased risk for heart attack, stroke, cancer, and alzheimer- type vascular dementia. It is very important that you work harder with diet by avoiding all foods that are white. Avoid white rice (brown & wild rice is OK), white potatoes (sweetpotatoes in moderation is OK), White bread or wheat bread or anything made out of white flour like bagels, donuts, rolls, buns, biscuits, cakes, pastries, cookies, pizza crust, and pasta (made from white flour & egg whites) - vegetarian pasta or spinach or wheat pasta is OK. Multigrain breads like Arnold's or [employer], or multigrain sandwich thins or flatbreads.  Diet, exercise and weight loss can reverse and cure diabetes in the early stages.  Diet, exercise and weight loss is very important in the control and prevention of complications of diabetes which affects every system in your body, ie. Brain - dementia/stroke, eyes - glaucoma/blindness, heart - heart attack/heart failure, kidneys - dialysis, stomach - gastric paralysis, intestines - malabsorption, nerves - severe painful neuritis, circulation - gangrene & loss of a leg(s), and finally cancer and Alzheimers.    I recommend avoid fried & greasy foods,  sweets/candy, white rice (brown or wild rice or Quinoa is OK), white potatoes (sweet potatoes are OK) - anything   made from white flour - bagels, doughnuts, rolls, buns, biscuits,white and wheat breads, pizza crust and traditional pasta made of white flour & egg white(vegetarian pasta or spinach or wheat pasta is OK).  Multi-grain bread is OK - like multi-grain flat bread or sandwich thins. Avoid alcohol in excess. Exercise is also important.    Eat all the vegetables you want - avoid meat, especially red meat and dairy - especially  cheese.  Cheese is the most concentrated form of trans-fats which is the worst thing to clog up our arteries. Veggie cheese is OK which can be found in the fresh produce section at [employer] or [employer] or [employer]  +++++++++++++++++++++++++++++++++++++++ DASH Eating Plan  DASH stands for "Dietary Approaches to Stop Hypertension."   The DASH eating plan is a healthy eating plan that has been shown to reduce high blood pressure (hypertension). Additional health benefits may include reducing the risk of type 2 diabetes mellitus, heart disease, and stroke. The DASH eating plan may also help with weight loss. WHAT DO I NEED TO KNOW ABOUT THE DASH EATING PLAN? For the DASH eating plan, you will follow these general guidelines: Choose foods with a percent daily value for sodium of less than 5% (as listed on the food label). Use salt-free seasonings or herbs instead of table salt or sea salt. Check with your health care provider or pharmacist before using salt substitutes. Eat lower-sodium products, often labeled as "lower sodium" or "no salt added." Eat fresh foods. Eat more vegetables, fruits, and low-fat dairy products. Choose whole grains. Look for the word "whole" as the first word in the ingredient list. Choose fish  Limit sweets, desserts, sugars, and sugary drinks. Choose heart-healthy fats. Eat veggie cheese  Eat more home-cooked food and less restaurant, buffet, and fast food. Limit fried foods. Cook foods using methods other than frying. Limit canned vegetables. If you do use them, rinse them well to decrease the sodium. When eating at a restaurant, ask that your food be prepared with less salt, or no salt if possible.                      WHAT FOODS CAN I EAT? Read Dr Joel Fuhrman's books on The End of Dieting & The End of Diabetes  Grains Whole grain or whole wheat bread. Brown rice. Whole grain or whole wheat pasta. Quinoa, bulgur, and whole grain cereals. Low-sodium  cereals. Corn or whole wheat flour tortillas. Whole grain cornbread. Whole grain crackers. Low-sodium crackers.  Vegetables Fresh or frozen vegetables (raw, steamed, roasted, or grilled). Low-sodium or reduced-sodium tomato and vegetable juices. Low-sodium or reduced-sodium tomato sauce and paste. Low-sodium or reduced-sodium canned vegetables.   Fruits All fresh, canned (in natural juice), or frozen fruits.  Protein Products  All fish and seafood.  Dried beans, peas, or lentils. Unsalted nuts and seeds. Unsalted canned beans.  Dairy Low-fat dairy products, such as skim or 1% milk, 2% or reduced-fat cheeses, low-fat ricotta or cottage cheese, or plain low-fat yogurt. Low-sodium or reduced-sodium cheeses.  Fats and Oils Tub margarines without trans fats. Light or reduced-fat mayonnaise and salad dressings (reduced sodium). Avocado. Safflower, olive, or canola oils. Natural peanut or almond butter.  Other Unsalted popcorn and pretzels. The items listed above may not be a complete list of recommended foods or beverages. Contact your dietitian for more options.  +++++++++++++++  WHAT FOODS ARE NOT RECOMMENDED? Grains/ White flour or wheat flour White bread. White pasta. White rice. Refined   cornbread. Bagels and croissants. Crackers that contain trans fat.  Vegetables  Creamed or fried vegetables. Vegetables in a . Regular canned vegetables. Regular canned tomato sauce and paste. Regular tomato and vegetable juices.  Fruits Dried fruits. Canned fruit in light or heavy syrup. Fruit juice.  Meat and Other Protein Products Meat in general - RED meat & White meat.  Fatty cuts of meat. Ribs, chicken wings, all processed meats as bacon, sausage, bologna, salami, fatback, hot dogs, bratwurst and packaged luncheon meats.  Dairy Whole or 2% milk, cream, half-and-half, and cream cheese. Whole-fat or sweetened yogurt. Full-fat cheeses or blue cheese. Non-dairy creamers and whipped toppings.  Processed cheese, cheese spreads, or cheese curds.  Condiments Onion and garlic salt, seasoned salt, table salt, and sea salt. Canned and packaged gravies. Worcestershire sauce. Tartar sauce. Barbecue sauce. Teriyaki sauce. Soy sauce, including reduced sodium. Steak sauce. Fish sauce. Oyster sauce. Cocktail sauce. Horseradish. Ketchup and mustard. Meat flavorings and tenderizers. Bouillon cubes. Hot sauce. Tabasco sauce. Marinades. Taco seasonings. Relishes.  Fats and Oils Butter, stick margarine, lard, shortening and bacon fat. Coconut, palm kernel, or palm oils. Regular salad dressings.  Pickles and olives. Salted popcorn and pretzels.  The items listed above may not be a complete list of foods and beverages to avoid.  

## 2023-04-06 ENCOUNTER — Encounter: Payer: Self-pay | Admitting: Internal Medicine

## 2023-04-06 ENCOUNTER — Other Ambulatory Visit: Payer: Self-pay | Admitting: Internal Medicine

## 2023-04-06 DIAGNOSIS — J40 Bronchitis, not specified as acute or chronic: Secondary | ICD-10-CM

## 2023-04-06 DIAGNOSIS — E039 Hypothyroidism, unspecified: Secondary | ICD-10-CM

## 2023-04-06 LAB — CBC WITH DIFFERENTIAL/PLATELET
Absolute Monocytes: 1281 cells/uL — ABNORMAL HIGH (ref 200–950)
Basophils Absolute: 79 cells/uL (ref 0–200)
Basophils Relative: 0.4 %
Eosinophils Absolute: 433 cells/uL (ref 15–500)
Eosinophils Relative: 2.2 %
HCT: 42.2 % (ref 35.0–45.0)
Hemoglobin: 13.7 g/dL (ref 11.7–15.5)
MCH: 28.3 pg (ref 27.0–33.0)
MCHC: 32.5 g/dL (ref 32.0–36.0)
MPV: 11 fL (ref 7.5–12.5)
Monocytes Relative: 6.5 %
Neutrophils Relative %: 60.8 %
RBC: 4.84 10*6/uL (ref 3.80–5.10)
RDW: 13.7 % (ref 11.0–15.0)
WBC: 19.7 10*3/uL — ABNORMAL HIGH (ref 3.8–10.8)

## 2023-04-06 LAB — COMPLETE METABOLIC PANEL WITH GFR
AG Ratio: 1.8 (calc) (ref 1.0–2.5)
ALT: 22 U/L (ref 6–29)
AST: 14 U/L (ref 10–35)
Albumin: 4.5 g/dL (ref 3.6–5.1)
Alkaline phosphatase (APISO): 80 U/L (ref 37–153)
BUN: 21 mg/dL (ref 7–25)
CO2: 27 mmol/L (ref 20–32)
Calcium: 9.5 mg/dL (ref 8.6–10.4)
Chloride: 96 mmol/L — ABNORMAL LOW (ref 98–110)
Creat: 0.94 mg/dL (ref 0.60–1.00)
Globulin: 2.5 g/dL (calc) (ref 1.9–3.7)
Glucose, Bld: 90 mg/dL (ref 65–99)
Potassium: 4.1 mmol/L (ref 3.5–5.3)
Sodium: 135 mmol/L (ref 135–146)
Total Bilirubin: 0.6 mg/dL (ref 0.2–1.2)
Total Protein: 7 g/dL (ref 6.1–8.1)
eGFR: 64 mL/min/{1.73_m2} (ref >=60)

## 2023-04-06 LAB — MAGNESIUM: Magnesium: 2 mg/dL (ref 1.5–2.5)

## 2023-04-06 LAB — HEMOGLOBIN A1C
Hgb A1c MFr Bld: 5.8 % of total Hgb — ABNORMAL HIGH (ref <5.7)
Mean Plasma Glucose: 120 mg/dL
eAG (mmol/L): 6.6 mmol/L

## 2023-04-06 LAB — LIPID PANEL
Cholesterol: 119 mg/dL (ref <200)
HDL: 43 mg/dL — ABNORMAL LOW (ref >=50)
LDL Cholesterol (Calc): 52 mg/dL (calc)
Non-HDL Cholesterol (Calc): 76 mg/dL (calc) (ref <130)
Total CHOL/HDL Ratio: 2.8 (calc) (ref <5.0)
Triglycerides: 165 mg/dL — ABNORMAL HIGH (ref <150)

## 2023-04-06 LAB — VITAMIN D 25 HYDROXY (VIT D DEFICIENCY, FRACTURES): Vit D, 25-Hydroxy: 101 ng/mL — ABNORMAL HIGH (ref 30–100)

## 2023-04-06 LAB — INSULIN, RANDOM: Insulin: 31.6 u[IU]/mL — ABNORMAL HIGH

## 2023-04-06 LAB — TSH: TSH: 60.12 mIU/L — ABNORMAL HIGH (ref 0.40–4.50)

## 2023-04-06 MED ORDER — LEVOTHYROXINE SODIUM 150 MCG PO TABS
ORAL_TABLET | ORAL | 1 refills | Status: DC
Start: 2023-04-06 — End: 2023-07-19

## 2023-04-06 MED ORDER — DEXAMETHASONE 2 MG PO TABS
ORAL_TABLET | ORAL | 1 refills | Status: DC
Start: 2023-04-06 — End: 2023-04-07

## 2023-04-06 MED ORDER — BENZONATATE 200 MG PO CAPS
ORAL_CAPSULE | ORAL | 1 refills | Status: DC
Start: 2023-04-06 — End: 2023-05-02

## 2023-04-06 MED ORDER — AZITHROMYCIN 250 MG PO TABS
ORAL_TABLET | ORAL | 1 refills | Status: DC
Start: 2023-04-06 — End: 2023-05-02

## 2023-04-06 NOTE — Progress Notes (Signed)
^<^<^<^<^<^<^<^<^<^<^<^<^<^<^<^<^<^<^<^<^<^<^<^<^<^<^<^<^<^<^<^<^<^<^<^<^ ^>^>^>^>^>^>^>^>^>^>^>>^>^>^>^>^>^>^>^>^>^>^>^>^>^>^>^>^>^>^>^>^>^>^>^>^>  -  Test results slightly outside the reference range are not unusual. If there is anything important, I will review this with you,  otherwise it is considered normal test values.  If you have further questions,  please do not hesitate to contact me at the office or via My Chart.   ^<^<^<^<^<^<^<^<^<^<^<^<^<^<^<^<^<^<^<^<^<^<^<^<^<^<^<^<^<^<^<^<^<^<^<^<^ ^>^>^>^>^>^>^>^>^>^>^>^>^>^>^>^>^>^>^>^>^>^>^>^>^>^>^>^>^>^>^>^>^>^>^>^>^  - CBC shows Normal Hgb / Red Cell count , But   Your WBC is moderately elevated consistent with a bacterial infection, So    With your newly onset symptoms of productive cough   - will presume this represents an early Bronchitis ( lung ) infection &   as discussed sent in new Rx's for a Z-pak, Tessalon perles for cough and   low dose Dexamethasone  ( Steroid ) meds   ^<^<^<^<^<^<^<^<^<^<^<^<^<^<^<^<^<^<^<^<^<^<^<^<^<^<^<^<^<^<^<^<^<^<^<^<^ ^>^>^>^>^>^>^>^>^>^>^>^>^>^>^>^>^>^>^>^>^>^>^>^>^>^>^>^>^>^>^>^>^>^>^>^>^  - Blood sugar, Kidney & Liver enzymes & functions - all Normal & OK   ^<^<^<^<^<^<^<^<^<^<^<^<^<^<^<^<^<^<^<^<^<^<^<^<^<^<^<^<^<^<^<^<^<^<^<^<^ ^>^>^>^>^>^>^>^>^>^>^>^>^>^>^>^>^>^>^>^>^>^>^>^>^>^>^>^>^>^>^>^>^>^>^>^>^  - Chol = 119   &  LDL Chol = 52 - Wonderful  /  Excellent   - Very low risk for Heart Attack  / Stroke  ^>^>^>^>^>^>^>^>^>^>^>^>^>^>^>^>^>^>^>^>^>^>^>^>^>^>^>^>^>^>^>^>^>^>^>^>^ ^>^>^>^>^>^>^>^>^>^>^>^>^>^>^>^>^>^>^>^>^>^>^>^>^>^>^>^>^>^>^>^>^>^>^>^>^  -  TSH is very elevated which means Thyroid Hormone is very Low in blood, So . . . . Marland Kitchen   as discussed               - Recommend restart Levothyroxine 150 mcg tablet Daily - every day -  for now   - Also please call office to schedule a Nurse Visit in about 4 weeks    ^<^<^<^<^<^<^<^<^<^<^<^<^<^<^<^<^<^<^<^<^<^<^<^<^<^<^<^<^<^<^<^<^<^<^<^<^ ^>^>^>^>^>^>^>^>^>^>^>^>^>^>^>^>^>^>^>^>^>^>^>^>^>^>^>^>^>^>^>^>^>^>^>^>^  - A1c better - down from 6.2% to now 5.8% - Almost back to Normal   ^<^<^<^<^<^<^<^<^<^<^<^<^<^<^<^<^<^<^<^<^<^<^<^<^<^<^<^<^<^<^<^<^<^<^<^<^ ^>^>^>^>^>^>^>^>^>^>^>^>^>^>^>^>^>^>^>^>^>^>^>^>^>^>^>^>^>^>^>^>^>^>^>^>^  - Vitamin D = 101 - great !   Please keep dosage same.   ^<^<^<^<^<^<^<^<^<^<^<^<^<^<^<^<^<^<^<^<^<^<^<^<^<^<^<^<^<^<^<^<^<^<^<^<^ ^>^>^>^>^>^>^>^>^>^>^>^>^>^>^>^>^>^>^>^>^>^>^>^>^>^>^>^>^>^>^>^>^>^>^>^>^

## 2023-04-07 ENCOUNTER — Other Ambulatory Visit: Payer: Self-pay | Admitting: Internal Medicine

## 2023-04-07 DIAGNOSIS — J40 Bronchitis, not specified as acute or chronic: Secondary | ICD-10-CM

## 2023-04-07 MED ORDER — DEXAMETHASONE 2 MG PO TABS: ORAL_TABLET | ORAL | 1 refills | Status: DC

## 2023-04-10 ENCOUNTER — Encounter: Payer: Self-pay | Admitting: Internal Medicine

## 2023-04-12 DIAGNOSIS — G4733 Obstructive sleep apnea (adult) (pediatric): Secondary | ICD-10-CM | POA: Diagnosis not present

## 2023-04-12 DIAGNOSIS — R0902 Hypoxemia: Secondary | ICD-10-CM | POA: Diagnosis not present

## 2023-04-19 ENCOUNTER — Encounter: Payer: Self-pay | Admitting: Internal Medicine

## 2023-04-20 ENCOUNTER — Other Ambulatory Visit: Payer: Self-pay | Admitting: Internal Medicine

## 2023-04-20 ENCOUNTER — Other Ambulatory Visit (HOSPITAL_COMMUNITY): Payer: Self-pay

## 2023-04-20 DIAGNOSIS — E1129 Type 2 diabetes mellitus with other diabetic kidney complication: Secondary | ICD-10-CM

## 2023-04-20 DIAGNOSIS — J40 Bronchitis, not specified as acute or chronic: Secondary | ICD-10-CM

## 2023-04-20 MED ORDER — PSEUDOEPHEDRINE HCL ER 120 MG PO TB12
ORAL_TABLET | ORAL | 3 refills | Status: DC
Start: 2023-04-20 — End: 2023-08-28

## 2023-04-20 MED ORDER — DOXYCYCLINE HYCLATE 100 MG PO CAPS
ORAL_CAPSULE | ORAL | 0 refills | Status: DC
Start: 2023-04-20 — End: 2023-05-02

## 2023-04-20 MED ORDER — DEXAMETHASONE 2 MG PO TABS: ORAL_TABLET | ORAL | 0 refills | Status: DC

## 2023-04-20 MED ORDER — TIRZEPATIDE 15 MG/0.5ML ~~LOC~~ SOAJ
15.0000 mg | SUBCUTANEOUS | 1 refills | Status: DC
Start: 2023-04-20 — End: 2024-02-08
  Filled 2023-04-20: qty 2, 28d supply, fill #0
  Filled 2023-06-05: qty 2, 28d supply, fill #1
  Filled 2023-07-13: qty 2, 28d supply, fill #2
  Filled 2023-09-07: qty 2, 28d supply, fill #3
  Filled 2023-11-06: qty 2, 28d supply, fill #4
  Filled 2024-02-07: qty 2, 28d supply, fill #5

## 2023-04-23 DIAGNOSIS — M8588 Other specified disorders of bone density and structure, other site: Secondary | ICD-10-CM | POA: Diagnosis not present

## 2023-04-23 LAB — HM DEXA SCAN

## 2023-04-25 ENCOUNTER — Other Ambulatory Visit (HOSPITAL_COMMUNITY): Payer: Self-pay

## 2023-04-25 ENCOUNTER — Encounter (HOSPITAL_COMMUNITY): Payer: Self-pay | Admitting: Gastroenterology

## 2023-04-25 ENCOUNTER — Telehealth: Payer: Self-pay

## 2023-04-25 ENCOUNTER — Other Ambulatory Visit: Payer: Self-pay

## 2023-04-25 NOTE — Progress Notes (Addendum)
COVID Vaccine Completed: yes  Date of COVID positive in last 90 days: no  PCP - Lucky Cowboy, MD Cardiologist - Epifanio Lesches, MD LOV 05/11/22  Chest x-ray - n/a EKG - 05/11/22 Epic Stress Test - 07/29/19 Epic ECHO - 12/08/20 Epic Cardiac Cath - n/a Pacemaker/ICD device last checked: n/a Spinal Cord Stimulator: n/a  Bowel Prep - yes, patient has instructions and supplies.   Sleep Study - yes CPAP - no longer per pt  Fasting Blood Sugar - 100s Checks Blood Sugar once a month  Last A1C 5.8 04/05/23 in Epic   Do not take Metformin DOS  Last dose of GLP1 agonist-  Mounjaro GLP1 instructions:  Hold 7 days. Last dose 04/29/23. Do not take 05/06/23   Last dose of SGLT-2 inhibitors-  N/A SGLT-2 instructions: N/A  Blood Thinner Instructions:  Time Aspirin Instructions: ASA 81, do not take DOS Last Dose:  Activity level: Can go up a flight of stairs and perform activities of daily living without stopping and without symptoms of chest pain or shortness of breath.   Anesthesia review: HTN, aortic atherosclerosis, asthma, OSA, fatty lvier, DM2, O2 hs 2L via [place], CKD, dyspnea, non obstructive CAD, recent bronchitis last day of symptoms per pt 04/22/23  Reached out to [facility]. Spoke with Waynetta Sandy, RN and requested clearance form PCP regarding recent bronchitis. Per Waynetta Sandy she will send a request.   Patient denies shortness of breath, fever, cough and chest pain   Patient verbalized understanding of instructions that were given to them

## 2023-04-25 NOTE — Telephone Encounter (Signed)
Pre-operative Risk Assessment     Request for surgical clearance:     Endoscopy Procedure  What type of surgery is being performed?     colonoscopy  When is this surgery scheduled?     05/07/23  What type of clearance is required ?   Medical due to recent bronchitis is requested by [facility].  Practice name and name of physician performing surgery?      [facility] with Dr Marsa Aris, MD  What is your office phone and fax number?      Phone- [phone]  Fax- [phone]  Anesthesia type (None, local, MAC, general) ?       MAC   Thank you

## 2023-04-25 NOTE — Telephone Encounter (Signed)
Lanora Manis,   - My front office is to contact Corrie Dandy Lada to schedule an OV on July 2 or 3 for pre-op clearance   - bill mck

## 2023-04-26 ENCOUNTER — Encounter: Payer: Self-pay | Admitting: Nurse Practitioner

## 2023-04-27 ENCOUNTER — Ambulatory Visit (INDEPENDENT_AMBULATORY_CARE_PROVIDER_SITE_OTHER): Payer: Medicare HMO

## 2023-04-27 DIAGNOSIS — J309 Allergic rhinitis, unspecified: Secondary | ICD-10-CM

## 2023-05-02 ENCOUNTER — Encounter: Payer: Self-pay | Admitting: Nurse Practitioner

## 2023-05-02 ENCOUNTER — Other Ambulatory Visit (HOSPITAL_COMMUNITY): Payer: Self-pay

## 2023-05-02 ENCOUNTER — Ambulatory Visit (INDEPENDENT_AMBULATORY_CARE_PROVIDER_SITE_OTHER): Payer: Medicare HMO | Admitting: Nurse Practitioner

## 2023-05-02 VITALS — BP 100/60 | HR 96 | Temp 95.0°F | Ht 63.0 in | Wt 186.6 lb

## 2023-05-02 DIAGNOSIS — E1122 Type 2 diabetes mellitus with diabetic chronic kidney disease: Secondary | ICD-10-CM | POA: Diagnosis not present

## 2023-05-02 DIAGNOSIS — I10 Essential (primary) hypertension: Secondary | ICD-10-CM

## 2023-05-02 DIAGNOSIS — Z79899 Other long term (current) drug therapy: Secondary | ICD-10-CM | POA: Diagnosis not present

## 2023-05-02 DIAGNOSIS — E039 Hypothyroidism, unspecified: Secondary | ICD-10-CM | POA: Diagnosis not present

## 2023-05-02 DIAGNOSIS — N182 Chronic kidney disease, stage 2 (mild): Secondary | ICD-10-CM | POA: Diagnosis not present

## 2023-05-02 DIAGNOSIS — J40 Bronchitis, not specified as acute or chronic: Secondary | ICD-10-CM | POA: Diagnosis not present

## 2023-05-02 DIAGNOSIS — E1129 Type 2 diabetes mellitus with other diabetic kidney complication: Secondary | ICD-10-CM

## 2023-05-02 LAB — CBC WITH DIFFERENTIAL/PLATELET
Absolute Monocytes: 762 cells/uL (ref 200–950)
HCT: 40 % (ref 35.0–45.0)
Lymphs Abs: 3678 cells/uL (ref 850–3900)
MCH: 28.3 pg (ref 27.0–33.0)
MPV: 10.2 fL (ref 7.5–12.5)
Neutro Abs: 7284 cells/uL (ref 1500–7800)
Neutrophils Relative %: 60.2 %
RBC: 4.52 10*6/uL (ref 3.80–5.10)
RDW: 13.9 % (ref 11.0–15.0)
Total Lymphocyte: 30.4 %

## 2023-05-02 NOTE — Progress Notes (Signed)
Assessment and Plan:  Jearld Lesch Lonzo was seen today for a follow up.  Diagnoses and all order for this visit:  1. Bronchitis Improved and cleared. Will obtain CBC for review of leukocytosis 04/05/23 - patient has colonoscopy scheduled for 05/07/23.  - CBC with Differential/Platelet  2. Hypothyroidism, unspecified type Elevated TSH Will evaluate medication adjustment based on updated TSH result once confirmed. Reminded to take on an empty stomach 30-59mins before food.  Stop any Biotin Supplement 48-72 hours before next TSH level to reduce the risk of falsely low TSH levels. Continue to monitor.     - TSH  3. Medication management All medications discussed and reviewed in full. All questions and concerns regarding medications addressed.    - CBC with Differential/Platelet - TSH - COMPLETE METABOLIC PANEL WITH GFR  4. Essential hypertension Discussed DASH (Dietary Approaches to Stop Hypertension) DASH diet is lower in sodium than a typical American diet. Cut back on foods that are high in saturated fat, cholesterol, and trans fats. Eat more whole-grain foods, fish, poultry, and nuts Remain active and exercise as tolerated daily.  Monitor BP at home-Call if greater than 130/80.  Check CMP/CBC  - CBC with Differential/Platelet  5. Type 2 diabetes mellitus with other diabetic kidney complication, without long-term current use of insulin [facility]) Education: Reviewed 'ABCs' of diabetes management  Discussed goals to be met and/or maintained include A1C (<7) Blood pressure (<130/80) Cholesterol (LDL <70) Continue Eye Exam yearly  Continue Dental Exam Q6 mo Discussed dietary recommendations Discussed Physical Activity recommendations Foot exam UTD Check A1C  - COMPLETE METABOLIC PANEL WITH GFR  6. CKD stage 2 due to type 2 diabetes mellitus (HCC) Discussed how what you eat and drink can aide in kidney protection. Stay well hydrated. Avoid high salt foods. Avoid NSAIDS. Keep  BP and BG well controlled.   Take medications as prescribed. Remain active and exercise as tolerated daily. Maintain weight.  Continue to monitor. Check CMP/GFR/Microablumin  - COMPLETE METABOLIC PANEL WITH GFR    Notify office for further evaluation and treatment, questions or concerns if any reported s/s fail to improve.   The patient was advised to call back or seek an in-person evaluation if any symptoms worsen or if the condition fails to improve as anticipated.   Further disposition pending results of labs. Discussed med's effects and SE's.    I discussed the assessment and treatment plan with the patient. The patient was provided an opportunity to ask questions and all were answered. The patient agreed with the plan and demonstrated an understanding of the instructions.  Discussed med's effects and SE's. Screening labs and tests as requested with regular follow-up as recommended.  I provided 25 minutes of face-to-face time during this encounter including counseling, chart review, and critical decision making was preformed.  Today's Plan of Care is based on a patient-centered health care approach known as shared decision making - the decisions, tests and treatments allow for patient preferences and values to be balanced with clinical evidence.     Future Appointments  Date Time Provider Department Center  05/22/2023  1:20 PM Little Ishikawa, MD [facility] None  07/10/2023  2:00 PM Raynelle Dick, NP [facility] None  10/11/2023  2:30 PM Lucky Cowboy, MD [facility] None  12/06/2023  2:15 PM Glean Salvo, NP [facility] None  12/28/2023 11:00 AM Adela Glimpse, NP [facility] None    ------------------------------------------------------------------------------------------------------------------   HPI BP 100/60   Pulse 96   Temp (!) 95 F (  35 C)   Ht 5\' 3"  (1.6 m)   Wt 186 lb 9.6 oz (84.6 kg)   SpO2 98%   BMI 33.05 kg/m   72 y.o.female presents for  general follow up after having been diagnosed with bronchitis 04/05/23 and blood work revealed leukocytosis WBC of 19.7 as well as elevation in TSH levels at 60.12.  She reports that she is much improved from respiratory illness.  No longer experiencing cough, wheezing, SOB.  She has a colonoscopy scheduled for 05/07/23.  She reports taking all medications in full.  She reports currently taking levothyroxine 150 mcg daily.   Her blood pressure has been well controlled. BP Readings from Last 3 Encounters:  05/02/23 100/60  04/05/23 128/72  03/08/23 108/64   She has a hx of DM.  Currently denies any s/s of hypoglycemia or hyperglycemia.   Lab Results  Component Value Date   HGBA1C 5.8 (H) 04/05/2023   Kidney function is stage 2.  She is trying to stay well hydrated.  Last GFR: Lab Results  Component Value Date   EGFR 64 04/05/2023    Past Medical History:  Diagnosis Date   Allergy    Anxiety    Arthritis    Cataract    Chronic kidney disease (CKD), stage II (mild) 07/01/2014   Overview:  Overview:  Due to DM, last GFR 64    Degenerative tear of triangular fibrocartilage complex (TFCC) of left wrist 01/17/2019   Depression    Fibromyalgia    GERD (gastroesophageal reflux disease)    Hyperlipidemia    Hypertension    Hypothyroidism    Hypoxemia    IBS (irritable bowel syndrome)    Liver disease    Migraine 02/19/2015   Migraines    Obesity    Osteoporosis    Oxygen deficiency    wears oxygen at noc- 2 liters   Pneumonia    Sensorineural hearing loss (SNHL) of left ear with unrestricted hearing of right ear 12/31/2017   Sleep apnea    uses oral device none   Sleep difficulties    Thyroid disease    hypothyroidism   Tuberculosis    positive PPD test, chest x ray negative   Type II or unspecified type diabetes mellitus without mention of complication, not stated as uncontrolled    Urticaria    Ventral hernia    Vertigo 02/19/2015     Allergies  Allergen Reactions    Floxin [Ofloxacin] Hives   Invokana [Canagliflozin]     Hematuria- avoid class   Viberzi [Eluxadoline] Hives    Current Outpatient Medications on File Prior to Visit  Medication Sig   aspirin 81 MG tablet Take 81 mg by mouth daily.   azelastine (ASTELIN) 0.1 % nasal spray Place 1 spray into both nostrils 2 (two) times daily. Use in each nostril as directed   baclofen (LIORESAL) 10 MG tablet TAKE 1 TABLET BY MOUTH TWICE A DAY AS NEEDED FOR MUSCLE SPASMS   CALCIUM PO Take 1,200 mg by mouth daily.   Cholecalciferol (VITAMIN D3) 125 MCG (5000 UT) CAPS Take  1 capsule Daily   Cyanocobalamin (VITAMIN B12 SL) Place 1 tablet under the tongue daily.   DULoxetine (CYMBALTA) 60 MG capsule TAKE 1 CAPSULE DAILY FOR CHRONIC PAIN SYNDROME.   EPINEPHrine 0.3 mg/0.3 mL IJ SOAJ injection Inject 0.3 mg into the muscle once.   famotidine (PEPCID) 40 MG tablet TAKE 1 TABLET EVERY 12 HOURS TO PREVENT HEARTBURN & INDIGESTION   fluticasone (FLONASE)  50 MCG/ACT nasal spray Place 1-2 sprays into both nostrils at bedtime.   glucose blood (ONE TOUCH ULTRA TEST) test strip Check Blood sugar Daily for diabetes   Lancets (ONETOUCH ULTRASOFT) lancets Check Blood sugar  daily   levothyroxine (SYNTHROID) 150 MCG tablet Take  1 tablet  Daily  on an empty stomach with only water for 30 minutes & no Antacid meds, Calcium or Magnesium for 4 hours & avoid Biotin   loperamide (IMODIUM) 2 MG capsule Take 1 capsule (2 mg total) by mouth as needed for diarrhea or loose stools.   losartan (COZAAR) 100 MG tablet TAKE 1 TABLET BY MOUTH DAILY FOR BLOOD PRESSURE AND KIDNEY PROTECTION   Magnesium 250 MG TABS Take 250 mg by mouth 2 (two) times daily.   metFORMIN (GLUCOPHAGE-XR) 500 MG 24 hr tablet Take  2 tablets   2 x /day   with Meals for Diabetes   metoprolol succinate (TOPROL-XL) 50 MG 24 hr tablet Take 1 tablet (50 mg total) by mouth daily. Take with or immediately following a meal.   montelukast (SINGULAIR) 10 MG tablet TAKE 1  TABLET BY MOUTH EVERYDAY AT BEDTIME   Multiple Vitamins-Minerals (MULTIVITAMIN PO) Take 1 tablet by mouth daily.    nortriptyline (PAMELOR) 10 MG capsule TAKE 1 CAPSULE BY MOUTH EVERYDAY AT BEDTIME   Omega-3 Fatty Acids (FISH OIL PO) Take by mouth daily.   omeprazole (PRILOSEC) 20 MG capsule TAKE 1 CAPSULE DAILY TO PREVENT HEARTBURN & ACID REFLUX   PRESCRIPTION MEDICATION Inject as directed every 30 (thirty) days. Allergy Shot   pseudoephedrine (SUDAFED) 120 MG 12 hr tablet Take  1 tablet  2 x /day (every 12 hours)  for Sinus & Chest Congestion   rosuvastatin (CRESTOR) 10 MG tablet TAKE 1 TABLET BY MOUTH EVERY DAY FOR CHOLESTEROL   SHINGRIX injection    tirzepatide (MOUNJARO) 15 MG/0.5ML Pen Inject 15 mg into the skin every 7 (seven) days for diabetes.   Turmeric (QC TUMERIC COMPLEX PO) Take by mouth.   Vitamin D, Ergocalciferol, (DRISDOL) 1.25 MG (50000 UNIT) CAPS capsule Take 1 capsule 1 x /week for Vit D Deficiency   azithromycin (ZITHROMAX) 250 MG tablet Take 2 tablets with Food on  Day 1, then 1 tablet Daily with Food for Sinusitis / Bronchitis   benzonatate (TESSALON) 200 MG capsule Take 1 perle 3 x / day to prevent cough   dexamethasone (DECADRON) 2 MG tablet Take 1 tab 3 x /day for 2 days,      then 2 x /day for 2  Days,     then 1 tab daily   doxycycline (VIBRAMYCIN) 100 MG capsule Take 1 capsule 2 x /day with meals for Infection   No current facility-administered medications on file prior to visit.    ROS: all negative except what is noted in the HPI.   Physical Exam:  BP 100/60   Pulse 96   Temp (!) 95 F (35 C)   Ht 5\' 3"  (1.6 m)   Wt 186 lb 9.6 oz (84.6 kg)   SpO2 98%   BMI 33.05 kg/m   General Appearance: NAD.  Awake, conversant and cooperative. Eyes: PERRLA, EOMs intact.  Sclera white.  Conjunctiva without erythema. Sinuses: No frontal/maxillary tenderness.  No nasal discharge. Nares patent.  ENT/Mouth: Ext aud canals clear.  Bilateral TMs w/DOL and without  erythema or bulging. Hearing intact.  Posterior pharynx without swelling or exudate.  Tonsils without swelling or erythema.  Neck: Supple.  No masses, nodules  or thyromegaly. Respiratory: Effort is regular with non-labored breathing. Breath sounds are equal bilaterally without rales, rhonchi, wheezing or stridor.  Cardio: RRR with no MRGs. Brisk peripheral pulses without edema.  Abdomen: Active BS in all four quadrants.  Soft and non-tender without guarding, rebound tenderness, hernias or masses. Lymphatics: Non tender without lymphadenopathy.  Musculoskeletal: Full ROM, 5/5 strength, normal ambulation.  No clubbing or cyanosis. Skin: Appropriate color for ethnicity. Warm without rashes, lesions, ecchymosis, ulcers.  Neuro: CN II-XII grossly normal. Normal muscle tone without cerebellar symptoms and intact sensation.   Psych: AO X 3,  appropriate mood and affect, insight and judgment.     Adela Glimpse, NP 3:00 PM [facility]

## 2023-05-02 NOTE — Patient Instructions (Signed)

## 2023-05-03 LAB — COMPLETE METABOLIC PANEL WITH GFR
AG Ratio: 2.1 (calc) (ref 1.0–2.5)
ALT: 12 U/L (ref 6–29)
AST: 12 U/L (ref 10–35)
Albumin: 3.9 g/dL (ref 3.6–5.1)
Alkaline phosphatase (APISO): 63 U/L (ref 37–153)
BUN: 13 mg/dL (ref 7–25)
CO2: 30 mmol/L (ref 20–32)
Calcium: 9.3 mg/dL (ref 8.6–10.4)
Chloride: 97 mmol/L — ABNORMAL LOW (ref 98–110)
Creat: 0.71 mg/dL (ref 0.60–1.00)
Globulin: 1.9 g/dL (calc) (ref 1.9–3.7)
Glucose, Bld: 75 mg/dL (ref 65–99)
Potassium: 4.3 mmol/L (ref 3.5–5.3)
Sodium: 135 mmol/L (ref 135–146)
Total Bilirubin: 0.6 mg/dL (ref 0.2–1.2)
Total Protein: 5.8 g/dL — ABNORMAL LOW (ref 6.1–8.1)
eGFR: 90 mL/min/{1.73_m2} (ref >=60)

## 2023-05-03 LAB — CBC WITH DIFFERENTIAL/PLATELET
Basophils Absolute: 36 cells/uL (ref 0–200)
Basophils Relative: 0.3 %
Eosinophils Absolute: 339 cells/uL (ref 15–500)
Eosinophils Relative: 2.8 %
Hemoglobin: 12.8 g/dL (ref 11.7–15.5)
MCHC: 32 g/dL (ref 32.0–36.0)
MCV: 88.5 fL (ref 80.0–100.0)
Monocytes Relative: 6.3 %
Platelets: 307 10*3/uL (ref 140–400)
WBC: 12.1 10*3/uL — ABNORMAL HIGH (ref 3.8–10.8)

## 2023-05-03 LAB — TSH: TSH: 1.45 mIU/L (ref 0.40–4.50)

## 2023-05-04 NOTE — Telephone Encounter (Signed)
Hello Dr Oneta Rack-  Just to clarify, has patient been given medical clearance by you to undergo colonoscopy on 05/07/23 following recent bronchitis? She was seen in your office on 05/02/23.   Thank you!

## 2023-05-07 ENCOUNTER — Encounter (HOSPITAL_COMMUNITY): Payer: Self-pay | Admitting: Gastroenterology

## 2023-05-07 ENCOUNTER — Other Ambulatory Visit: Payer: Self-pay

## 2023-05-07 ENCOUNTER — Encounter (HOSPITAL_COMMUNITY)
Admission: RE | Disposition: A | Discharge status: Home / Self Care | Payer: Self-pay | Source: Home / Self Care | Attending: Gastroenterology

## 2023-05-07 ENCOUNTER — Other Ambulatory Visit: Payer: Self-pay | Admitting: Nurse Practitioner

## 2023-05-07 ENCOUNTER — Ambulatory Visit (HOSPITAL_COMMUNITY)
Admission: RE | Admit: 2023-05-07 | Discharge: 2023-05-07 | Disposition: A | Discharge status: Home / Self Care | Payer: Medicare HMO | Attending: Gastroenterology | Admitting: Gastroenterology

## 2023-05-07 ENCOUNTER — Ambulatory Visit (HOSPITAL_COMMUNITY): Payer: Medicare HMO | Admitting: Physician Assistant

## 2023-05-07 DIAGNOSIS — E039 Hypothyroidism, unspecified: Secondary | ICD-10-CM | POA: Insufficient documentation

## 2023-05-07 DIAGNOSIS — Z6833 Body mass index (BMI) 33.0-33.9, adult: Secondary | ICD-10-CM | POA: Diagnosis not present

## 2023-05-07 DIAGNOSIS — E1122 Type 2 diabetes mellitus with diabetic chronic kidney disease: Secondary | ICD-10-CM | POA: Insufficient documentation

## 2023-05-07 DIAGNOSIS — Z79890 Hormone replacement therapy (postmenopausal): Secondary | ICD-10-CM | POA: Insufficient documentation

## 2023-05-07 DIAGNOSIS — D125 Benign neoplasm of sigmoid colon: Secondary | ICD-10-CM | POA: Insufficient documentation

## 2023-05-07 DIAGNOSIS — D127 Benign neoplasm of rectosigmoid junction: Secondary | ICD-10-CM

## 2023-05-07 DIAGNOSIS — N182 Chronic kidney disease, stage 2 (mild): Secondary | ICD-10-CM | POA: Insufficient documentation

## 2023-05-07 DIAGNOSIS — D122 Benign neoplasm of ascending colon: Secondary | ICD-10-CM | POA: Diagnosis not present

## 2023-05-07 DIAGNOSIS — E669 Obesity, unspecified: Secondary | ICD-10-CM | POA: Diagnosis not present

## 2023-05-07 DIAGNOSIS — M797 Fibromyalgia: Secondary | ICD-10-CM | POA: Diagnosis not present

## 2023-05-07 DIAGNOSIS — D72828 Other elevated white blood cell count: Secondary | ICD-10-CM

## 2023-05-07 DIAGNOSIS — Z1211 Encounter for screening for malignant neoplasm of colon: Secondary | ICD-10-CM | POA: Diagnosis not present

## 2023-05-07 DIAGNOSIS — Z86010 Personal history of colonic polyps: Secondary | ICD-10-CM | POA: Diagnosis not present

## 2023-05-07 DIAGNOSIS — K219 Gastro-esophageal reflux disease without esophagitis: Secondary | ICD-10-CM | POA: Diagnosis not present

## 2023-05-07 DIAGNOSIS — K648 Other hemorrhoids: Secondary | ICD-10-CM

## 2023-05-07 DIAGNOSIS — K589 Irritable bowel syndrome without diarrhea: Secondary | ICD-10-CM | POA: Insufficient documentation

## 2023-05-07 DIAGNOSIS — F418 Other specified anxiety disorders: Secondary | ICD-10-CM | POA: Diagnosis not present

## 2023-05-07 DIAGNOSIS — I129 Hypertensive chronic kidney disease with stage 1 through stage 4 chronic kidney disease, or unspecified chronic kidney disease: Secondary | ICD-10-CM | POA: Diagnosis not present

## 2023-05-07 DIAGNOSIS — F32A Depression, unspecified: Secondary | ICD-10-CM | POA: Diagnosis not present

## 2023-05-07 DIAGNOSIS — K644 Residual hemorrhoidal skin tags: Secondary | ICD-10-CM | POA: Insufficient documentation

## 2023-05-07 DIAGNOSIS — Z9981 Dependence on supplemental oxygen: Secondary | ICD-10-CM

## 2023-05-07 DIAGNOSIS — K5730 Diverticulosis of large intestine without perforation or abscess without bleeding: Secondary | ICD-10-CM | POA: Diagnosis not present

## 2023-05-07 DIAGNOSIS — J45909 Unspecified asthma, uncomplicated: Secondary | ICD-10-CM | POA: Insufficient documentation

## 2023-05-07 DIAGNOSIS — Z79899 Other long term (current) drug therapy: Secondary | ICD-10-CM | POA: Diagnosis not present

## 2023-05-07 DIAGNOSIS — E785 Hyperlipidemia, unspecified: Secondary | ICD-10-CM | POA: Insufficient documentation

## 2023-05-07 DIAGNOSIS — F419 Anxiety disorder, unspecified: Secondary | ICD-10-CM | POA: Insufficient documentation

## 2023-05-07 DIAGNOSIS — K635 Polyp of colon: Secondary | ICD-10-CM

## 2023-05-07 DIAGNOSIS — G4730 Sleep apnea, unspecified: Secondary | ICD-10-CM | POA: Diagnosis not present

## 2023-05-07 DIAGNOSIS — Z7984 Long term (current) use of oral hypoglycemic drugs: Secondary | ICD-10-CM | POA: Insufficient documentation

## 2023-05-07 DIAGNOSIS — D126 Benign neoplasm of colon, unspecified: Secondary | ICD-10-CM

## 2023-05-07 HISTORY — DX: Pneumonia, unspecified organism: J18.9

## 2023-05-07 HISTORY — PX: COLONOSCOPY WITH PROPOFOL: SHX5780

## 2023-05-07 HISTORY — DX: Hypothyroidism, unspecified: E03.9

## 2023-05-07 HISTORY — PX: POLYPECTOMY: SHX5525

## 2023-05-07 LAB — GLUCOSE, CAPILLARY
Glucose-Capillary: 94 mg/dL (ref 70–99)
Glucose-Capillary: 74 mg/dL (ref 70–99)

## 2023-05-07 SURGERY — COLONOSCOPY WITH PROPOFOL
Anesthesia: Monitor Anesthesia Care

## 2023-05-07 MED ORDER — PROPOFOL 10 MG/ML IV BOLUS
INTRAVENOUS | Status: DC | PRN
  Administered 2023-05-07 (×4): 20 mg via INTRAVENOUS

## 2023-05-07 MED ORDER — PROPOFOL 500 MG/50ML IV EMUL
INTRAVENOUS | Status: AC
  Filled 2023-05-07: qty 50

## 2023-05-07 MED ORDER — PROPOFOL 500 MG/50ML IV EMUL
INTRAVENOUS | Status: DC | PRN
  Administered 2023-05-07: 125 ug/kg/min via INTRAVENOUS

## 2023-05-07 MED ORDER — LACTATED RINGERS IV SOLN: INTRAVENOUS | Status: DC

## 2023-05-07 MED ORDER — LACTATED RINGERS IV SOLN
INTRAVENOUS | Status: AC | PRN
  Administered 2023-05-07: 1000 mL via INTRAVENOUS

## 2023-05-07 MED ORDER — LIDOCAINE 2% (20 MG/ML) 5 ML SYRINGE
INTRAMUSCULAR | Status: DC | PRN
  Administered 2023-05-07: 80 mg via INTRAVENOUS

## 2023-05-07 MED ORDER — SODIUM CHLORIDE 0.9 % IV SOLN: INTRAVENOUS | Status: DC

## 2023-05-07 SURGICAL SUPPLY — 22 items

## 2023-05-07 NOTE — H&P (Signed)
[facility] History and Physical   Primary Care Physician:  Lucky Cowboy, MD   Reason for Procedure:  History of adenomatous colon polyps  Plan:    Surveillance colonoscopy with possible interventions as needed     HPI: Grace Barry is a very pleasant 72 y.o. female here for surveillance colonoscopy. Denies any nausea, vomiting, abdominal pain, melena or bright red blood per rectum  The risks and benefits as well as alternatives of endoscopic procedure(s) have been discussed and reviewed. All questions answered. The patient agrees to proceed.    Past Medical History:  Diagnosis Date   Allergy    Anxiety    Arthritis    Cataract    Chronic kidney disease (CKD), stage II (mild) 07/01/2014   Overview:  Overview:  Due to DM, last GFR 64    Degenerative tear of triangular fibrocartilage complex (TFCC) of left wrist 01/17/2019   Depression    Fibromyalgia    GERD (gastroesophageal reflux disease)    Hyperlipidemia    Hypertension    Hypothyroidism    Hypoxemia    IBS (irritable bowel syndrome)    Liver disease    Migraine 02/19/2015   Migraines    Obesity    Osteoporosis    Oxygen deficiency    wears oxygen at noc- 2 liters   Pneumonia    Sensorineural hearing loss (SNHL) of left ear with unrestricted hearing of right ear 12/31/2017   Sleep apnea    uses oral device none   Sleep difficulties    Thyroid disease    hypothyroidism   Tuberculosis    positive PPD test, chest x ray negative   Type II or unspecified type diabetes mellitus without mention of complication, not stated as uncontrolled    Urticaria    Ventral hernia    Vertigo 02/19/2015    Past Surgical History:  Procedure Laterality Date   ABDOMINAL HYSTERECTOMY     ADENOIDECTOMY     CARPAL TUNNEL RELEASE     bilateral   CATARACT EXTRACTION, BILATERAL     CESAREAN SECTION     x2   CHOLECYSTECTOMY     COLONOSCOPY     HERNIA REPAIR  07/24/11   ventral hernia   JOINT REPLACEMENT      bilateral knees   KNEE ARTHROSCOPY     bilateral   SINOSCOPY     TONSILLECTOMY     TOTAL KNEE ARTHROPLASTY     bilateral    Prior to Admission medications   Medication Sig Start Date End Date Taking? Authorizing Provider  aspirin 81 MG tablet Take 81 mg by mouth daily.   Yes [provider]  azelastine (ASTELIN) 0.1 % nasal spray Place 1 spray into both nostrils 2 (two) times daily. Use in each nostril as directed Patient taking differently: Place 1 spray into both nostrils daily. Use in each nostril as directed 05/23/22  Yes Alfonse Spruce, MD  baclofen (LIORESAL) 10 MG tablet TAKE 1 TABLET BY MOUTH TWICE A DAY AS NEEDED FOR MUSCLE SPASMS 12/05/22  Yes Glean Salvo, NP  CALCIUM PO Take 1,200 mg by mouth daily.   Yes [provider]  Cholecalciferol (VITAMIN D3) 125 MCG (5000 UT) CAPS Take  1 capsule Daily 04/05/23  Yes Lucky Cowboy, MD  Cyanocobalamin (VITAMIN B12 SL) Place 500 mcg under the tongue daily.   Yes [provider]  DULoxetine (CYMBALTA) 60 MG capsule TAKE 1 CAPSULE DAILY FOR CHRONIC PAIN SYNDROME. 12/15/22  Yes Aundria Rud,  Rosalva Ferron, NP  famotidine (PEPCID) 40 MG tablet TAKE 1 TABLET EVERY 12 HOURS TO PREVENT HEARTBURN & INDIGESTION 09/18/22  Yes Raynelle Dick, NP  fluticasone (FLONASE) 50 MCG/ACT nasal spray Place 1-2 sprays into both nostrils at bedtime. Patient taking differently: Place 1 spray into both nostrils at bedtime. 05/23/22  Yes Alfonse Spruce, MD  levothyroxine (SYNTHROID) 150 MCG tablet Take  1 tablet  Daily  on an empty stomach with only water for 30 minutes & no Antacid meds, Calcium or Magnesium for 4 hours & avoid Biotin 04/06/23  Yes Lucky Cowboy, MD  loperamide (IMODIUM) 2 MG capsule Take 1 capsule (2 mg total) by mouth as needed for diarrhea or loose stools. 08/29/21  Yes Judd Gaudier, NP  losartan (COZAAR) 100 MG tablet TAKE 1 TABLET BY MOUTH DAILY FOR BLOOD PRESSURE AND KIDNEY PROTECTION 12/30/21  Yes Judd Gaudier, NP  Magnesium 250 MG TABS Take 250 mg by mouth 2 (two) times daily.   Yes [provider]  metFORMIN (GLUCOPHAGE-XR) 500 MG 24 hr tablet Take  2 tablets   2 x /day   with Meals for Diabetes 08/23/22  Yes Lucky Cowboy, MD  metoprolol succinate (TOPROL-XL) 50 MG 24 hr tablet Take 1 tablet (50 mg total) by mouth daily. Take with or immediately following a meal. 04/27/22  Yes Raynelle Dick, NP  montelukast (SINGULAIR) 10 MG tablet TAKE 1 TABLET BY MOUTH EVERYDAY AT BEDTIME 12/15/22  Yes Alfonse Spruce, MD  Multiple Vitamins-Minerals (MULTIVITAMIN PO) Take 1 tablet by mouth daily.    Yes [provider]  nortriptyline (PAMELOR) 10 MG capsule TAKE 1 CAPSULE BY MOUTH EVERYDAY AT BEDTIME 11/23/22  Yes Glean Salvo, NP  Omega-3 Fatty Acids (FISH OIL PO) Take 600 mg by mouth daily.   Yes [provider]  omeprazole (PRILOSEC) 20 MG capsule TAKE 1 CAPSULE DAILY TO PREVENT HEARTBURN & ACID REFLUX 09/18/22  Yes Raynelle Dick, NP  PRESCRIPTION MEDICATION Inject as directed every 30 (thirty) days. Allergy Shot   Yes [provider]  pseudoephedrine (SUDAFED) 120 MG 12 hr tablet Take  1 tablet  2 x /day (every 12 hours)  for Sinus & Chest Congestion Patient taking differently: Take 120 mg by mouth daily as needed for congestion (or Sinus & Chest Congestion). 04/20/23  Yes Lucky Cowboy, MD  rosuvastatin (CRESTOR) 10 MG tablet TAKE 1 TABLET BY MOUTH EVERY DAY FOR CHOLESTEROL 09/18/22  Yes Raynelle Dick, NP  tirzepatide [facility]) 15 MG/0.5ML Pen Inject 15 mg into the skin every 7 (seven) days for diabetes. 04/20/23  Yes Lucky Cowboy, MD  Turmeric (QC TUMERIC COMPLEX PO) Take 1 tablet by mouth daily.   Yes [provider]  EPINEPHrine 0.3 mg/0.3 mL IJ SOAJ injection Inject 0.3 mg into the muscle once.    [provider]  glucose blood (ONE TOUCH ULTRA TEST) test strip Check Blood sugar Daily for diabetes 02/18/20   Quentin Mulling  R, PA-C  Lancets [facility] ULTRASOFT) lancets Check Blood sugar  daily 05/22/18   Lucky Cowboy, MD  [facility] injection  12/25/21   [provider]  Vitamin D, Ergocalciferol, (DRISDOL) 1.25 MG (50000 UNIT) CAPS capsule Take 1 capsule 1 x /week for Vit D Deficiency Patient not taking: Reported on 05/04/2023 01/01/23   Adela Glimpse, NP    Current Facility-Administered Medications  Medication Dose Route Frequency Provider Last Rate Last Admin   0.9 %  sodium chloride infusion   Intravenous Continuous Zehr,  Princella Pellegrini, PA-C       lactated ringers infusion   Intravenous Continuous Napoleon Form, MD        Allergies as of 03/08/2023 - Review Complete 03/08/2023  Allergen Reaction Noted   Floxin [ofloxacin] Hives 06/12/2011   Invokana [canagliflozin]  02/14/2020   Viberzi [eluxadoline] Hives 07/22/2015    Family History  Problem Relation Age of Onset   Breast cancer Mother    Hypertension Father    Hyperlipidemia Father    Prostate cancer Father    Hyperlipidemia Sister    Stomach cancer Son    Esophageal cancer Son    Colon cancer Son    Colon polyps Neg Hx    Diabetes Neg Hx    Kidney disease Neg Hx    Migraines Neg Hx    Rectal cancer Neg Hx     Social History   Socioeconomic History   Marital status: Married    Spouse name: Fredrik Cove   Number of children: 2   Years of education: masters   Highest education level: Not on file  Occupational History   Occupation: Retired Runner, broadcasting/film/video  Tobacco Use   Smoking status: Never   Smokeless tobacco: Never  Vaping Use   Vaping Use: Never used  Substance and Sexual Activity   Alcohol use: Yes    Alcohol/week: 0.0 standard drinks of alcohol    Comment: occasional   Drug use: No   Sexual activity: Not on file  Other Topics Concern   Not on file  Social History Narrative   Lives with husband, Fredrik Cove   Patient is right handed.   Patient drinks 64 oz of diet soda daily.   Social Determinants of Health   Financial  Resource Strain: Not on file  Food Insecurity: Not on file  Transportation Needs: Not on file  Physical Activity: Not on file  Stress: Not on file  Social Connections: Not on file  Intimate Partner Violence: Not on file    Review of Systems:  All other review of systems negative except as mentioned in the HPI.  Physical Exam: Vital signs in last 24 hours: Height 5\' 3"  (1.6 m)   Weight 83.9 kg   Body Mass Index 32.77 kg/m  General:   Alert, NAD Lungs:  Clear .   Heart:  Regular rate and rhythm Abdomen:  Soft, nontender and nondistended. Neuro/Psych:  Alert and cooperative. Normal mood and affect. A and O x 3  Reviewed labs, radiology imaging, old records and pertinent past GI work up  Patient is appropriate for planned procedure(s) and anesthesia in an ambulatory setting   K. Scherry Ran , MD [phone]

## 2023-05-07 NOTE — Anesthesia Preprocedure Evaluation (Signed)
Anesthesia Evaluation  Patient identified by MRN, date of birth, ID band Patient awake    Reviewed: Allergy & Precautions, NPO status , Patient's Chart, lab work & pertinent test results  Airway Mallampati: III  TM Distance: >3 FB Neck ROM: Full    Dental no notable dental hx.    Pulmonary asthma , sleep apnea and Oxygen sleep apnea    Pulmonary exam normal        Cardiovascular hypertension, Pt. on medications and Pt. on home beta blockers Normal cardiovascular exam     Neuro/Psych  Headaches PSYCHIATRIC DISORDERS Anxiety Depression       GI/Hepatic Neg liver ROS,GERD  Medicated and Controlled,,  Endo/Other  diabetesHypothyroidism    Renal/GU Renal disease     Musculoskeletal  (+) Arthritis ,  Fibromyalgia -  Abdominal  (+) + obese  Peds  Hematology negative hematology ROS (+)   Anesthesia Other Findings hx colon polyps, oxygen use  Reproductive/Obstetrics                             Anesthesia Physical Anesthesia Plan  ASA: 4  Anesthesia Plan: MAC   Post-op Pain Management:    Induction: Intravenous  PONV Risk Score and Plan: 2 and Propofol infusion and Treatment may vary due to age or medical condition  Airway Management Planned: Simple Face Mask  Additional Equipment:   Intra-op Plan:   Post-operative Plan:   Informed Consent: I have reviewed the patients History and Physical, chart, labs and discussed the procedure including the risks, benefits and alternatives for the proposed anesthesia with the patient or authorized representative who has indicated his/her understanding and acceptance.     Dental advisory given  Plan Discussed with: CRNA  Anesthesia Plan Comments:        Anesthesia Quick Evaluation

## 2023-05-07 NOTE — Op Note (Signed)
[facility] Patient Name: Grace Barry Procedure Date: 05/07/2023 MRN: [id number] Attending MD: Napoleon Form , MD, [id number] Date of Birth: [birth year] CSN: [id number] Age: 71 Admit Type: Outpatient Procedure:                Colonoscopy Indications:              High risk colon cancer surveillance: Personal                            history of adenoma (10 mm or greater in size), High                            risk colon cancer surveillance: Personal history of                            multiple (3 or more) adenomas Providers:                Napoleon Form, MD, Adin Hector, RN, Rozetta Nunnery, Technician Referring MD:              Medicines:                Monitored Anesthesia Care Complications:            No immediate complications. Estimated Blood Loss:     Estimated blood loss was minimal. Procedure:                Pre-Anesthesia Assessment:                           - Prior to the procedure, a History and Physical                            was performed, and patient medications and                            allergies were reviewed. The patient's tolerance of                            previous anesthesia was also reviewed. The risks                            and benefits of the procedure and the sedation                            options and risks were discussed with the patient.                            All questions were answered, and informed consent                            was obtained. Prior Anticoagulants: The patient has  taken no anticoagulant or antiplatelet agents. ASA                            Grade Assessment: III - A patient with severe                            systemic disease. After reviewing the risks and                            benefits, the patient was deemed in satisfactory                            condition to undergo the procedure.                            After obtaining informed consent, the colonoscope                            was passed under direct vision. Throughout the                            procedure, the patient's blood pressure, pulse, and                            oxygen saturations were monitored continuously. The                            [id number]-HQ190L ([id number]) Olympus colonoscope was                            introduced through the anus and advanced to the the                            cecum, identified by the appendiceal orifice. The                            colonoscopy was performed without difficulty. The                            patient tolerated the procedure well. The quality                            of the bowel preparation was good. The ileocecal                            valve, appendiceal orifice, and rectum were                            photographed. Scope In: 11:06:47 AM Scope Out: 11:28:10 AM Scope Withdrawal Time: 0 hours 12 minutes 37 seconds  Total Procedure Duration: 0 hours 21 minutes 23 seconds  Findings:      The perianal and digital rectal examinations were normal.      Two sessile polyps were found in the recto-sigmoid colon and ascending  colon. The polyps were 8 to 9 mm in size. These polyps were removed with       a cold snare. Resection and retrieval were complete.      Scattered small-mouthed diverticula were found in the sigmoid colon,       descending colon, transverse colon and ascending colon.      Non-bleeding external and internal hemorrhoids were found during       retroflexion. The hemorrhoids were small. Impression:               - Two 8 to 9 mm polyps at the recto-sigmoid colon                            and in the ascending colon, removed with a cold                            snare. Resected and retrieved.                           - Diverticulosis in the sigmoid colon, in the                            descending colon, in the transverse colon and in                             the ascending colon.                           - Non-bleeding external and internal hemorrhoids. Moderate Sedation:      Not Applicable - Patient had care per Anesthesia. Recommendation:           - Patient has a contact number available for                            emergencies. The signs and symptoms of potential                            delayed complications were discussed with the                            patient. Return to normal activities tomorrow.                            Written discharge instructions were provided to the                            patient.                           - Resume previous diet.                           - Continue present medications.                           - Await pathology results.                           -  Repeat colonoscopy in 5 years for surveillance                            based on pathology results. Procedure Code(s):        --- Professional ---                           [phone], Colonoscopy, flexible; with removal of                            tumor(s), polyp(s), or other lesion(s) by snare                            technique Diagnosis Code(s):        --- Professional ---                           D12.7, Benign neoplasm of rectosigmoid junction                           D12.2, Benign neoplasm of ascending colon                           K64.8, Other hemorrhoids                           Z86.010, Personal history of colonic polyps                           K57.30, Diverticulosis of large intestine without                            perforation or abscess without bleeding CPT copyright 2022 American [employer] Association. All rights reserved. The codes documented in this report are preliminary and upon coder review may  be revised to meet current compliance requirements. Napoleon Form, MD 05/07/2023 11:36:59 AM This report has been signed electronically. Number of Addenda: 0

## 2023-05-07 NOTE — Transfer of Care (Signed)
Immediate Anesthesia Transfer of Care Note  Patient: Grace Barry  Procedure(s) Performed: COLONOSCOPY WITH PROPOFOL POLYPECTOMY  Patient Location: PACU  Anesthesia Type:MAC  Level of Consciousness: awake, alert , and oriented  Airway & Oxygen Therapy: Patient Spontanous Breathing and Patient connected to face mask oxygen  Post-op Assessment: Report given to RN and Post -op Vital signs reviewed and stable  Post vital signs: Reviewed and stable  Last Vitals:  Vitals Value Taken Time  BP    Temp    Pulse    Resp    SpO2      Last Pain:  Vitals:   05/07/23 1041  TempSrc: Tympanic  PainSc: 0-No pain         Complications: No notable events documented.

## 2023-05-07 NOTE — Anesthesia Postprocedure Evaluation (Signed)
Anesthesia Post Note  Patient: Grace Barry  Procedure(s) Performed: COLONOSCOPY WITH PROPOFOL POLYPECTOMY     Patient location during evaluation: Endoscopy Anesthesia Type: MAC Level of consciousness: awake Pain management: pain level controlled Vital Signs Assessment: post-procedure vital signs reviewed and stable Respiratory status: spontaneous breathing, nonlabored ventilation and respiratory function stable Cardiovascular status: blood pressure returned to baseline and stable Postop Assessment: no apparent nausea or vomiting Anesthetic complications: no   No notable events documented.  Last Vitals:  Vitals:   05/07/23 1145 05/07/23 1153  BP: 115/64 101/63  Pulse: 99 91  Resp: 14 (!) 23  Temp:    SpO2: 95% 96%    Last Pain:  Vitals:   05/07/23 1153  TempSrc:   PainSc: 0-No pain                 Pariss Hommes P Lynnzie Blackson

## 2023-05-07 NOTE — Discharge Instructions (Signed)

## 2023-05-08 ENCOUNTER — Other Ambulatory Visit: Payer: Self-pay | Admitting: Nurse Practitioner

## 2023-05-08 ENCOUNTER — Encounter: Payer: Self-pay | Admitting: Internal Medicine

## 2023-05-08 ENCOUNTER — Encounter: Payer: Self-pay | Admitting: Gastroenterology

## 2023-05-08 DIAGNOSIS — I10 Essential (primary) hypertension: Secondary | ICD-10-CM

## 2023-05-08 DIAGNOSIS — E1169 Type 2 diabetes mellitus with other specified complication: Secondary | ICD-10-CM

## 2023-05-08 LAB — SURGICAL PATHOLOGY

## 2023-05-08 MED ORDER — METOPROLOL SUCCINATE ER 50 MG PO TB24: 50.0000 mg | ORAL_TABLET | Freq: Every day | ORAL | 3 refills | Status: DC

## 2023-05-08 MED ORDER — ROSUVASTATIN CALCIUM 10 MG PO TABS: ORAL_TABLET | ORAL | 3 refills | Status: DC

## 2023-05-09 ENCOUNTER — Other Ambulatory Visit: Payer: Self-pay

## 2023-05-09 ENCOUNTER — Encounter (HOSPITAL_COMMUNITY): Payer: Self-pay | Admitting: Gastroenterology

## 2023-05-09 ENCOUNTER — Other Ambulatory Visit: Payer: BC Managed Care – PPO

## 2023-05-09 DIAGNOSIS — D72828 Other elevated white blood cell count: Secondary | ICD-10-CM

## 2023-05-10 LAB — PATHOLOGIST SMEAR REVIEW

## 2023-05-12 DIAGNOSIS — G4733 Obstructive sleep apnea (adult) (pediatric): Secondary | ICD-10-CM | POA: Diagnosis not present

## 2023-05-12 DIAGNOSIS — R0902 Hypoxemia: Secondary | ICD-10-CM | POA: Diagnosis not present

## 2023-05-14 ENCOUNTER — Ambulatory Visit: Payer: BC Managed Care – PPO

## 2023-05-14 DIAGNOSIS — H524 Presbyopia: Secondary | ICD-10-CM | POA: Diagnosis not present

## 2023-05-14 DIAGNOSIS — E119 Type 2 diabetes mellitus without complications: Secondary | ICD-10-CM | POA: Diagnosis not present

## 2023-05-14 LAB — HM DIABETES EYE EXAM

## 2023-05-16 DIAGNOSIS — R69 Illness, unspecified: Secondary | ICD-10-CM | POA: Diagnosis not present

## 2023-05-22 ENCOUNTER — Ambulatory Visit: Payer: Medicare HMO | Admitting: Cardiology

## 2023-05-23 ENCOUNTER — Encounter: Payer: Self-pay | Admitting: Internal Medicine

## 2023-05-30 ENCOUNTER — Ambulatory Visit (INDEPENDENT_AMBULATORY_CARE_PROVIDER_SITE_OTHER): Payer: Medicare HMO

## 2023-05-30 DIAGNOSIS — J309 Allergic rhinitis, unspecified: Secondary | ICD-10-CM

## 2023-06-05 ENCOUNTER — Encounter: Payer: Self-pay | Admitting: Internal Medicine

## 2023-06-12 DIAGNOSIS — G4733 Obstructive sleep apnea (adult) (pediatric): Secondary | ICD-10-CM | POA: Diagnosis not present

## 2023-06-12 DIAGNOSIS — R0902 Hypoxemia: Secondary | ICD-10-CM | POA: Diagnosis not present

## 2023-06-27 ENCOUNTER — Encounter: Payer: BC Managed Care – PPO | Admitting: Nurse Practitioner

## 2023-06-28 ENCOUNTER — Ambulatory Visit (INDEPENDENT_AMBULATORY_CARE_PROVIDER_SITE_OTHER): Payer: Medicare HMO

## 2023-06-28 DIAGNOSIS — J309 Allergic rhinitis, unspecified: Secondary | ICD-10-CM

## 2023-07-09 NOTE — Progress Notes (Deleted)
Assessment and Plan:  Diagnoses and all orders for this visit:  Type 2 diabetes mellitus with other diabetic kidney complication, without long-term current use of insulin (HCC) Continue metformin, increase Ozempic to 2 mg SQ QW Continue diet and exercise.  Perform daily foot/skin check, notify office of any concerning changes.  Check A1C  -     CBC with Differential/Platelet -     COMPLETE METABOLIC PANEL WITH GFR -     Semaglutide, 2 MG/DOSE, (OZEMPIC, 2 MG/DOSE,) 8 MG/3ML SOPN; Inject 2 mg into the skin once a week.  CKD stage 2 due to type 2 diabetes mellitus (HCC) Increase fluids, avoid NSAIDS, monitor sugars, will monitor  -     CBC with Differential/Platelet -     COMPLETE METABOLIC PANEL WITH GFR  Vitamin D deficiency disease Continue Vit D supplementation to maintain value in therapeutic level of 60-100  -     VITAMIN D 25 Hydroxy (Vit-D Deficiency, Fractures)    Type 2 diabetes mellitus with Morbid obesity (HCC) Fair life protein shakes Eat more frequently - try not to go more than 6 hours without protein Aim for 90 grams of protein a day- 30 breakfast/30 lunch 30 dinner Try to keep net carbs less than 50 Net Carbs=Total Carbs-fiber- sugar alcohols Exercise heartrate 120-140(fat burning zone)- walking 20-30 minutes 4 days a week -     TSH        Further disposition pending results of labs. Discussed med's effects and SE's.   Over 30 minutes of exam, counseling, chart review, and critical decision making was performed.   Future Appointments  Date Time Provider Department Center  07/10/2023  2:00 PM Raynelle Dick, NP [facility] None  08/30/2023  3:00 PM Little Ishikawa, MD [facility] None  10/11/2023  2:30 PM Lucky Cowboy, MD [facility] None  12/06/2023  2:15 PM Glean Salvo, NP [facility] None  12/28/2023 11:00 AM Adela Glimpse, NP [facility] None  07/10/2024  2:00 PM Raynelle Dick, NP [facility] None     ------------------------------------------------------------------------------------------------------------------   HPI There were no vitals taken for this visit.  72 y.o.female presents for persistent fatigue. For the past 6 weeks fatigue has been worse.  Was evaluated by cardiology on 05/11/22 by Dr. Bjorn Pippin. All tests were normal and was advised to continue current medications. She awakens feeling fatigued.  She has trouble falling asleep but does get 7-9 hours of sleep. Has not been told she snores.  Has excessive daytime somnolence.  She had a sleep study 18 months ago that showed no sleep apnea. She does sleep with 2 L [place] O2 at night due to shallow breathing which is ordered through pulmonology.  BMI is There is no height or weight on file to calculate BMI., she has been working on diet and exercise. Walking 3 days a week for 15 minutes. She skips breakfast, eats a bowl of cereal or toast at lunch and then meat and vegetables at dinner Wt Readings from Last 3 Encounters:  05/07/23 184 lb 15.5 oz (83.9 kg)  05/02/23 186 lb 9.6 oz (84.6 kg)  04/05/23 184 lb 12.8 oz (83.8 kg)    She is currently on Ozempic 1 mg SQ QW without side effects and Metformin 500 mg 2 tab bid.  Last A1c was: Lab Results  Component Value Date   HGBA1C 5.8 (H) 04/05/2023     Past Medical History:  Diagnosis Date   Allergy    Anxiety    Arthritis  Cataract    Chronic kidney disease (CKD), stage II (mild) 07/01/2014   Overview:  Overview:  Due to DM, last GFR 64    Degenerative tear of triangular fibrocartilage complex (TFCC) of left wrist 01/17/2019   Depression    Fibromyalgia    GERD (gastroesophageal reflux disease)    Hyperlipidemia    Hypertension    Hypothyroidism    Hypoxemia    IBS (irritable bowel syndrome)    Liver disease    Migraine 02/19/2015   Migraines    Obesity    Osteoporosis    Oxygen deficiency    wears oxygen at noc- 2 liters   Pneumonia    Sensorineural hearing  loss (SNHL) of left ear with unrestricted hearing of right ear 12/31/2017   Sleep apnea    uses oral device none   Sleep difficulties    Thyroid disease    hypothyroidism   Tuberculosis    positive PPD test, chest x ray negative   Type II or unspecified type diabetes mellitus without mention of complication, not stated as uncontrolled    Urticaria    Ventral hernia    Vertigo 02/19/2015     Allergies  Allergen Reactions   Floxin [Ofloxacin] Hives   Invokana [Canagliflozin]     Hematuria- avoid class   Viberzi [Eluxadoline] Hives    Current Outpatient Medications on File Prior to Visit  Medication Sig   aspirin 81 MG tablet Take 81 mg by mouth daily.   azelastine (ASTELIN) 0.1 % nasal spray Place 1 spray into both nostrils 2 (two) times daily. Use in each nostril as directed (Patient taking differently: Place 1 spray into both nostrils daily. Use in each nostril as directed)   baclofen (LIORESAL) 10 MG tablet TAKE 1 TABLET BY MOUTH TWICE A DAY AS NEEDED FOR MUSCLE SPASMS   CALCIUM PO Take 1,200 mg by mouth daily.   Cholecalciferol (VITAMIN D3) 125 MCG (5000 UT) CAPS Take  1 capsule Daily   Cyanocobalamin (VITAMIN B12 SL) Place 500 mcg under the tongue daily.   DULoxetine (CYMBALTA) 60 MG capsule TAKE 1 CAPSULE DAILY FOR CHRONIC PAIN SYNDROME.   EPINEPHrine 0.3 mg/0.3 mL IJ SOAJ injection Inject 0.3 mg into the muscle once.   famotidine (PEPCID) 40 MG tablet TAKE 1 TABLET EVERY 12 HOURS TO PREVENT HEARTBURN & INDIGESTION   fluticasone (FLONASE) 50 MCG/ACT nasal spray Place 1-2 sprays into both nostrils at bedtime. (Patient taking differently: Place 1 spray into both nostrils at bedtime.)   glucose blood (ONE TOUCH ULTRA TEST) test strip Check Blood sugar Daily for diabetes   Lancets (ONETOUCH ULTRASOFT) lancets Check Blood sugar  daily   levothyroxine (SYNTHROID) 150 MCG tablet Take  1 tablet  Daily  on an empty stomach with only water for 30 minutes & no Antacid meds, Calcium or  Magnesium for 4 hours & avoid Biotin   loperamide (IMODIUM) 2 MG capsule Take 1 capsule (2 mg total) by mouth as needed for diarrhea or loose stools.   losartan (COZAAR) 100 MG tablet TAKE 1 TABLET BY MOUTH DAILY FOR BLOOD PRESSURE AND KIDNEY PROTECTION   Magnesium 250 MG TABS Take 250 mg by mouth 2 (two) times daily.   metFORMIN (GLUCOPHAGE-XR) 500 MG 24 hr tablet Take  2 tablets   2 x /day   with Meals for Diabetes   metoprolol succinate (TOPROL-XL) 50 MG 24 hr tablet Take 1 tablet (50 mg total) by mouth daily. Take with or immediately following a meal.  montelukast (SINGULAIR) 10 MG tablet TAKE 1 TABLET BY MOUTH EVERYDAY AT BEDTIME   Multiple Vitamins-Minerals (MULTIVITAMIN PO) Take 1 tablet by mouth daily.    nortriptyline (PAMELOR) 10 MG capsule TAKE 1 CAPSULE BY MOUTH EVERYDAY AT BEDTIME   Omega-3 Fatty Acids (FISH OIL PO) Take 600 mg by mouth daily.   omeprazole (PRILOSEC) 20 MG capsule TAKE 1 CAPSULE DAILY TO PREVENT HEARTBURN & ACID REFLUX   PRESCRIPTION MEDICATION Inject as directed every 30 (thirty) days. Allergy Shot   pseudoephedrine (SUDAFED) 120 MG 12 hr tablet Take  1 tablet  2 x /day (every 12 hours)  for Sinus & Chest Congestion (Patient taking differently: Take 120 mg by mouth daily as needed for congestion (or Sinus & Chest Congestion).)   rosuvastatin (CRESTOR) 10 MG tablet TAKE 1 TABLET BY MOUTH EVERY DAY FOR CHOLESTEROL   SHINGRIX injection    tirzepatide (MOUNJARO) 15 MG/0.5ML Pen Inject 15 mg into the skin every 7 (seven) days for diabetes.   Turmeric (QC TUMERIC COMPLEX PO) Take 1 tablet by mouth daily.   Vitamin D, Ergocalciferol, (DRISDOL) 1.25 MG (50000 UNIT) CAPS capsule Take 1 capsule 1 x /week for Vit D Deficiency (Patient not taking: Reported on 05/04/2023)   No current facility-administered medications on file prior to visit.    ROS: all negative except above.   Physical Exam:  There were no vitals taken for this visit.  General Appearance: Well nourished,  in no apparent distress. Eyes: PERRLA, EOMs, conjunctiva no swelling or erythema Sinuses: No Frontal/maxillary tenderness ENT/Mouth: Ext aud canals clear, TMs without erythema, bulging. No erythema, swelling, or exudate on post pharynx.  Tonsils not swollen or erythematous. Hearing normal.  Neck: Supple, thyroid normal.  Respiratory: Respiratory effort normal, BS equal bilaterally without rales, rhonchi, wheezing or stridor.  Cardio: RRR with no MRGs. Brisk peripheral pulses without edema.  Abdomen: Soft, + BS.  Non tender, no guarding, rebound, hernias, masses. Lymphatics: Non tender without lymphadenopathy.  Musculoskeletal: Full ROM, 5/5 strength, normal gait.  Skin: Warm, dry without rashes, lesions, ecchymosis.  Neuro: Cranial nerves intact. Normal muscle tone, no cerebellar symptoms. Sensation intact.  Psych: Awake and oriented X 3, normal affect, Insight and Judgment appropriate.     Raynelle Dick, NP 1:03 PM [facility]

## 2023-07-10 ENCOUNTER — Encounter: Payer: BC Managed Care – PPO | Admitting: Nurse Practitioner

## 2023-07-13 ENCOUNTER — Other Ambulatory Visit (HOSPITAL_COMMUNITY): Payer: Self-pay

## 2023-07-13 ENCOUNTER — Other Ambulatory Visit: Payer: Self-pay | Admitting: Allergy & Immunology

## 2023-07-13 ENCOUNTER — Other Ambulatory Visit: Payer: Self-pay

## 2023-07-13 DIAGNOSIS — R0902 Hypoxemia: Secondary | ICD-10-CM | POA: Diagnosis not present

## 2023-07-13 DIAGNOSIS — G4733 Obstructive sleep apnea (adult) (pediatric): Secondary | ICD-10-CM | POA: Diagnosis not present

## 2023-07-17 NOTE — Progress Notes (Unsigned)
CPE AND FOLLOW UP  Assessment:   Encounter for general adult medical examination with abnormal findings 1 year Mammogram and colonoscopy are UTD  Type 2 diabetes mellitus with Stage 2 CKD (HCC) -     Hemoglobin A1c Continue Mounjaro 15 mg SQ QW and Metformin 500 mg 2 tabs BID New monitor ordered today Discussed general issues about diabetes pathophysiology and management., Educational material distributed., Suggested low cholesterol diet., Encouraged aerobic exercise., Discussed foot care., Reminded to get yearly retinal exam.  CKD stage 2 due to type 2 diabetes mellitus (HCC) -     Hemoglobin A1c - U/A , microalbumin/creatinine ratio  Increase fluids, avoid NSAIDS, monitor sugars, will monitor  Hyperlipidemia associated with type 2 diabetes mellitus (HCC) -     Lipid panel Continue Rosuvastatin 10 mg qd decrease fatty foods increase activity.   Depression, major, recurrent, in remission (HCC) -     DULoxetine (CYMBALTA) 30 MG capsule; Take 1 capsule (30 mg total) by mouth daily. Contributing to fatigue- has been on cymbalta in the past and did well- will try to stop paxil and switch to cymbalta.   Insomnia Begin Trazodone 50 mg 1-2 tab at bedtime Practice good sleep hygiene  Fatigue, unspecified type Neuro study showed no sleep apnea, does use nasal cannula O2 at night due to hypoxia Check labs Treat depression Close folllow up -     EKG 12-Lead       Atherosclerosis of aorta(HCC) Per CT 07/2019 Control blood pressure, cholesterol, glucose, increase exercise.  AAA   Essential hypertension - continue medications losartan 100 mg every day and Metoprolol 50 mg every day , DASH diet, exercise and monitor at home. Call if greater than 130/80.  - CBC with Differential/Platelet - CMP/GFR - TSH   Obstructive sleep apnea Last sleep study showed no longer has sleep apnea but uses O2 2L nasal cannula  Asthma, unspecified asthma severity,  uncomplicated controlled  Gastroesophageal reflux disease without esophagitis Continue PPI/H2 blocker, diet discussed Still has symptoms more related to stress  Arthritis RICE, NSAIDS PRN, ortho referral if needed  Diarrhea IBS, controlled   History of colonic polyps Colonoscopy due 2024  Morbid obesity (HCC) - increase veggies, decrease carbs - long discussion about weight loss, diet, and exercise - patient admits to emotional eating; strategies discussed, get chocolate out of the house, get husband on board, have plan for when cravings hit, try protein heavy snacks, information provided -Continue Mounjaro 15 mg SQ QW  Hypothyroidism continue medications, levothyroxine 150 mcg every day  pending lab results reminded to take on an empty stomach 30-66mins before food.  check TSH level  Vitamin D Deficiency Continue Vit D supplementation to maintain value in therapeutic level of 60-100  - VIT D  Liver disease/ fatty liver disease Check labs, avoid tylenol, alcohol, weight loss advised.   Screening for Ischemic heart disease EKG  Medication management -     CBC with Differential/Platelet -     COMPLETE METABOLIC PANEL WITH GFR -     Magnesium -     Lipid panel -     TSH -     Hemoglobin A1C w/out eAG -     VITAMIN D 25 Hydroxy (Vit-D Deficiency, Fractures) -     EKG 12-Lead -     Urinalysis, Routine w reflex microscopic -     Microalbumin / creatinine urine ratio   Screening for hematuria or proteinuria -     Urinalysis, Routine w reflex microscopic -  Microalbumin / creatinine urine ratio   Osteopenia, unspecified location Continue weight bearing exercises and Vit D supplementation Dexa T score - 2.2 04/23/23  Flu vaccine need -     Flu vaccine HIGH DOSE PF     Over 30 minutes of exam, counseling, chart review, and critical decision making was performed  Future Appointments  Date Time Provider Department Center  08/30/2023  3:00 PM Little Ishikawa, MD [facility] None  10/11/2023  2:30 PM Lucky Cowboy, MD [facility] None  12/06/2023  2:15 PM Glean Salvo, NP [facility] None  12/28/2023 11:00 AM Adela Glimpse, NP [facility] None  07/17/2024  2:00 PM Raynelle Dick, NP [facility] None      Subjective:   Grace Barry is a 72 y.o. female who presents for CPE and 3 month follow up on hypertension, T2 diabetes with CKD, hyperlipidemia, vitamin D def.   She and her husband are going to [place] and [place] in 3 weeks , will be gone 3 weeks.   She has followed with Dr. Toni Arthurs . She states she does not feel like she has good sleep. She will still wake up tired. Sleep study december no apnea, using 2L O2 through nasal cannula for dropping O2 with sleep. She continues to have increased fatigue and does not sleep well.   She has history of migraines, she has been following with Dr. Anne Hahn. She is using Noritriptyline 10 mg QHS. Uses Baclofen rarely for migraines.  She is on Cymbalta for body aches and depression.   BMI is Body mass index is 31.81 kg/m., she has not been working on diet and exercise. She eats 1-2 meals a day. She emotionally and bored eat. She drink 120 oz of water a day, with crystal lite very diluted. She has been walking 5 days a week She is currently on Mounjaro 15 mg sq qw AND HAS LOST 7 POUNDS IN THE PAST 2 MONTHS. Highest weight 240 Wt Readings from Last 3 Encounters:  07/18/23 179 lb 9.6 oz (81.5 kg)  05/07/23 184 lb 15.5 oz (83.9 kg)  05/02/23 186 lb 9.6 oz (84.6 kg)   She had CT coronary 07/2019 -CAD-RADS 1. Minimal non-obstructive CAD (0-24%) but did show aortic atherosclerosis Her blood pressure has been controlled at home,  Metoprolol 50mg  XL for palpitations, losartan  100 mg,BP: 122/64  BP Readings from Last 3 Encounters:  07/18/23 122/64  05/07/23 101/63  05/02/23 100/60  She does workout. She denies chest pain, shortness of breath, dizziness.   She is on cholesterol medication,  crestor 10mg  daily and denies myalgias. Her cholesterol is at goal. The cholesterol was:   Lab Results  Component Value Date   CHOL 119 04/05/2023   HDL 43 (L) 04/05/2023   LDLCALC 52 04/05/2023   TRIG 165 (H) 04/05/2023   CHOLHDL 2.8 04/05/2023   She has been working on diet and exercise for Diabetes  with CKD III on ACE/ARB- Losartan 100 mg QD With hyperlipidemia- Rosuvastatin 10 mg QD  Metformin 500mg  2 tabs BID Mounjaro 15 mg SQ QW she is on bASA Denies paresthesia of the feet, polydipsia, polyuria and visual disturbances. [facility] [place] [facility]- scheduled 05/14/23 Blood sugars running mid 90's Last A1C was:  Lab Results  Component Value Date   HGBA1C 5.8 (H) 04/05/2023    She has CKD IIIa associated with T2DM monitored at this office:  Lab Results  Component Value Date   EGFR 90 05/02/2023    Patient is  currently on Vitamin D supplement 60454 units once a week and was a little elevated at recent check:  Lab Results  Component Value Date   VD25OH 101 (H) 04/05/2023   She is on thyroid medication, 150 mcg 1 tab daily, she is not on biotin. Normal thyroid US 2014. Her medication was not changed last visit,.  Lab Results  Component Value Date   TSH 1.45 05/02/2023   Mammogram 04/02/23- benign repeat in 1 year Colonoscopy 05/07/23- polyps and repeat in 5 years is recommended  Medication Review  Current Outpatient Medications (Endocrine & Metabolic):    levothyroxine (SYNTHROID) 150 MCG tablet, Take  1 tablet  Daily  on an empty stomach with only water for 30 minutes & no Antacid meds, Calcium or Magnesium for 4 hours & avoid Biotin   metFORMIN (GLUCOPHAGE-XR) 500 MG 24 hr tablet, Take  2 tablets   2 x /day   with Meals for Diabetes   tirzepatide (MOUNJARO) 15 MG/0.5ML Pen, Inject 15 mg into the skin every 7 (seven) days for diabetes.  Current Outpatient Medications (Cardiovascular):    EPINEPHrine 0.3 mg/0.3 mL IJ SOAJ injection, Inject 0.3 mg into the muscle once.   losartan  (COZAAR) 100 MG tablet, TAKE 1 TABLET BY MOUTH DAILY FOR BLOOD PRESSURE AND KIDNEY PROTECTION   metoprolol succinate (TOPROL-XL) 50 MG 24 hr tablet, Take 1 tablet (50 mg total) by mouth daily. Take with or immediately following a meal.   rosuvastatin (CRESTOR) 10 MG tablet, TAKE 1 TABLET BY MOUTH EVERY DAY FOR CHOLESTEROL  Current Outpatient Medications (Respiratory):    azelastine (ASTELIN) 0.1 % nasal spray, Place 1 spray into both nostrils 2 (two) times daily. Use in each nostril as directed (Patient taking differently: Place 1 spray into both nostrils daily. Use in each nostril as directed)   fluticasone (FLONASE) 50 MCG/ACT nasal spray, Place 1-2 sprays into both nostrils at bedtime. (Patient taking differently: Place 1 spray into both nostrils at bedtime.)   montelukast (SINGULAIR) 10 MG tablet, TAKE 1 TABLET BY MOUTH EVERYDAY AT BEDTIME   pseudoephedrine (SUDAFED) 120 MG 12 hr tablet, Take  1 tablet  2 x /day (every 12 hours)  for Sinus & Chest Congestion (Patient taking differently: Take 120 mg by mouth daily as needed for congestion (or Sinus & Chest Congestion).)  Current Outpatient Medications (Analgesics):    aspirin 81 MG tablet, Take 81 mg by mouth daily.  Current Outpatient Medications (Hematological):    Cyanocobalamin (VITAMIN B12 SL), Place 500 mcg under the tongue daily.  Current Outpatient Medications (Other):    baclofen (LIORESAL) 10 MG tablet, TAKE 1 TABLET BY MOUTH TWICE A DAY AS NEEDED FOR MUSCLE SPASMS   CALCIUM PO, Take 1,200 mg by mouth daily.   Cholecalciferol (VITAMIN D3) 125 MCG (5000 UT) CAPS, Take  1 capsule Daily   DULoxetine (CYMBALTA) 60 MG capsule, TAKE 1 CAPSULE DAILY FOR CHRONIC PAIN SYNDROME.   famotidine (PEPCID) 40 MG tablet, TAKE 1 TABLET EVERY 12 HOURS TO PREVENT HEARTBURN & INDIGESTION   Lancets (ONETOUCH ULTRASOFT) lancets, Check Blood sugar  daily   loperamide (IMODIUM) 2 MG capsule, Take 1 capsule (2 mg total) by mouth as needed for diarrhea or  loose stools.   Magnesium 250 MG TABS, Take 250 mg by mouth 2 (two) times daily.   Multiple Vitamins-Minerals (MULTIVITAMIN PO), Take 1 tablet by mouth daily.    nortriptyline (PAMELOR) 10 MG capsule, TAKE 1 CAPSULE BY MOUTH EVERYDAY AT BEDTIME   Omega-3 Fatty Acids (  FISH OIL PO), Take 600 mg by mouth daily.   omeprazole (PRILOSEC) 20 MG capsule, TAKE 1 CAPSULE DAILY TO PREVENT HEARTBURN & ACID REFLUX   PRESCRIPTION MEDICATION, Inject as directed every 30 (thirty) days. Allergy Shot   Turmeric (QC TUMERIC COMPLEX PO), Take 1 tablet by mouth daily.   Blood Glucose Monitoring Suppl DEVI, Use glucometer to check blood sugars once a day   glucose blood (ONE TOUCH ULTRA TEST) test strip, Check Blood sugar Daily for diabetes   SHINGRIX injection,    traZODone (DESYREL) 50 MG tablet, 1-2 tablet for sleep   Vitamin D, Ergocalciferol, (DRISDOL) 1.25 MG (50000 UNIT) CAPS capsule, Take 1 capsule 1 x /week for Vit D Deficiency (Patient not taking: Reported on 05/04/2023)  Current Problems (verified) Patient Active Problem List   Diagnosis Date Noted   Adenomatous polyp of ascending colon 05/07/2023   Polyp of sigmoid colon 05/07/2023   Oxygen dependent 03/08/2023   Hypoxemia associated with sleep 10/02/2020   Leukocytosis 09/27/2020   Tremor 07/28/2020   Uncontrolled REM sleep behavior disorder 07/20/2020   Sleep paralysis, recurrent isolated 07/20/2020   Bad dreams 07/20/2020   Psychophysiological insomnia 07/20/2020   Aortic atherosclerosis (HCC) by Abd  CT scan in 2020 12/12/2019   Pain 01/17/2019   Degenerative tear of triangular fibrocartilage complex (TFCC) of left wrist 01/17/2019   Hyperlipidemia associated with type 2 diabetes mellitus (HCC) 11/07/2018   Osteopenia 11/07/2018   Seasonal and perennial allergic rhinitis 07/17/2018   Sensorineural hearing loss (SNHL) of left ear with unrestricted hearing of right ear 12/31/2017   Bilateral impacted cerumen 05/31/2016   Dysfunction of left  eustachian tube 05/31/2016   Subjective tinnitus of left ear 01/24/2016   Asthma 01/03/2016   Sensorineural hearing loss (SNHL), bilateral 01/03/2016   Allergic conjunctivitis 07/10/2015   Vitamin D deficiency disease 05/19/2015   Medication management 05/19/2015   Other long term (current) drug therapy 05/19/2015   Migraine 02/19/2015   Vertigo 02/19/2015   History of colonic polyps 01/15/2015   IBS (irritable bowel syndrome) 09/01/2014   Diarrhea 09/01/2014   Morbid obesity (HCC) 07/01/2014   CKD stage 2 due to type 2 diabetes mellitus (HCC) 07/01/2014   Obesity with body mass index 30 or greater 07/01/2014   Fatty liver    Arthritis    Hypothyroidism 12/15/2010   T2_NIDDM w/CKD (HCC) 12/15/2010   Depression, major, recurrent, in remission (HCC) 12/15/2010   Essential hypertension 12/15/2010   GERD 12/15/2010   Depression 12/15/2010   Obstructive sleep apnea syndrome 12/15/2010   Type 2 diabetes mellitus (HCC) 12/15/2010    Screening Tests Immunization History  Administered Date(s) Administered   Influenza, High Dose Seasonal PF 08/10/2015, 08/10/2016, 08/28/2017, 11/07/2018, 08/07/2019, 09/21/2020, 07/25/2021, 09/26/2022   Influenza-Unspecified 08/05/2019   PFIZER(Purple Top)SARS-COV-2 Vaccination 11/19/2019, 12/10/2019, 08/05/2020   Pneumococcal Conjugate-13 07/01/2014   Pneumococcal Polysaccharide-23 10/09/2002, 05/26/2008, 12/13/2016   Td 10/09/2002, 10/30/2010   Tdap 08/10/2015   Zoster Recombinant(Shingrix) 07/25/2021   Zoster, Live 06/20/2011   Health Maintenance  Topic Date Due   Zoster Vaccines- Shingrix (2 of 2) 09/19/2021   FOOT EXAM  12/22/2022   INFLUENZA VACCINE  05/31/2023   Diabetic kidney evaluation - Urine ACR  06/27/2023   COVID-19 Vaccine (4 - 2023-24 season) 07/01/2023   HEMOGLOBIN A1C  10/05/2023   Medicare Annual Wellness (AWV)  01/01/2024   MAMMOGRAM  04/01/2024   Diabetic kidney evaluation - eGFR measurement  05/01/2024   OPHTHALMOLOGY  EXAM  05/13/2024   DEXA SCAN  04/22/2025   DTaP/Tdap/Td (4 - Td or Tdap) 08/09/2025   Colonoscopy  05/06/2026   Pneumonia Vaccine 67+ Years old  Completed   Hepatitis C Screening  Completed   HPV VACCINES  Aged Out     Names of Other Physician/Practitioners you currently use: 1. [facility]- here for primary care 2. Dr. Laural Benes, [place], eye doctor, 04/2023 3. Dr. Lonia Chimera, dentist, last visit q 6 months 4. Dr. Neale Burly, neuro  5. Dr. Toni Arthurs, sleep apnea  Patient Care Team: Lucky Cowboy, MD as PCP - General (Internal Medicine) Genene Churn, DC as Anesthesiologist (Chiropractic Medicine)   Allergies Allergies  Allergen Reactions   Floxin [Ofloxacin] Hives   Invokana [Canagliflozin]     Hematuria- avoid class   Viberzi [Eluxadoline] Hives    SURGICAL HISTORY She  has a past surgical history that includes Total knee arthroplasty; Cholecystectomy; Abdominal hysterectomy; Cesarean section; Tonsillectomy; Knee arthroscopy; Carpal tunnel release; Hernia repair (07/24/11); Joint replacement; Adenoidectomy; Sinoscopy; Cataract extraction, bilateral; Colonoscopy; Colonoscopy with propofol (N/A, 05/07/2023); and polypectomy (05/07/2023). FAMILY HISTORY Her family history includes Breast cancer in her mother; Colon cancer in her son; Esophageal cancer in her son; Hyperlipidemia in her father and sister; Hypertension in her father; Prostate cancer in her father; Stomach cancer in her son. SOCIAL HISTORY She  reports that she has never smoked. She has never used smokeless tobacco. She reports current alcohol use. She reports that she does not use drugs.  Review of Systems:  Review of Systems  Constitutional: Negative.  Negative for chills and fever.  HENT:  Negative for congestion, ear discharge, ear pain, hearing loss, nosebleeds, sore throat and tinnitus.   Eyes: Negative.  Negative for blurred vision and double vision.  Respiratory: Negative.   Negative for cough, hemoptysis, sputum production, shortness of breath, wheezing and stridor.   Cardiovascular: Negative.  Negative for chest pain, palpitations and leg swelling.  Gastrointestinal:  Positive for diarrhea (irregular) and heartburn. Negative for abdominal pain, blood in stool, constipation, melena, nausea and vomiting.  Genitourinary: Negative.  Negative for dysuria and urgency.  Musculoskeletal:  Positive for joint pain (knees). Negative for back pain, falls, myalgias and neck pain.  Skin: Negative.  Negative for rash.  Neurological:  Positive for headaches. Negative for dizziness, tingling, tremors, sensory change, speech change, focal weakness, seizures, loss of consciousness and weakness.  Endo/Heme/Allergies: Negative.  Does not bruise/bleed easily.  Psychiatric/Behavioral:  Positive for depression. Negative for substance abuse and suicidal ideas. The patient has insomnia. The patient is not nervous/anxious.      Objective:   Today's Vitals   07/18/23 1355  BP: 122/64  Pulse: (!) 105  Temp: 97.8 F (36.6 C)  SpO2: 95%  Weight: 179 lb 9.6 oz (81.5 kg)  Height: 5\' 3"  (1.6 m)    General appearance: alert, no distress, WD/WN,  female HEENT: normocephalic, sclerae anicteric, TMs normal, no erythema, bulging.  nares patent, no discharge or erythema, pharynx normal Oral cavity: MMM, no lesions Neck: supple, no lymphadenopathy, no thyromegaly Heart: RRR, normal S1, S2, no murmurs Lungs: CTA bilaterally, no wheezes, rhonchi, or rales Abdomen: +bs, soft, non tender, non distended, no masses, no hepatomegaly, no splenomegaly Musculoskeletal: nontender, no swelling, no obvious deformity Extremities: no edema, no cyanosis, no clubbing Pulses: 2+ symmetric, upper and lower extremities, normal cap refill Neurological: alert, oriented x 3, CN2-12 intact, strength normal upper extremities and lower extremities, sensation normal throughout, DTRs 2+ throughout, no cerebellar  signs, gait normal Psychiatric: normal affect, behavior  normal, pleasant  Skin: warm and dry, no suspicious lesions EKG: Sinus Tachycardia, no ST changes Breast/Pelvic: defer to GYN  [facility]  07/18/2023

## 2023-07-18 ENCOUNTER — Other Ambulatory Visit (HOSPITAL_COMMUNITY): Payer: Self-pay

## 2023-07-18 ENCOUNTER — Encounter: Payer: Self-pay | Admitting: Nurse Practitioner

## 2023-07-18 ENCOUNTER — Ambulatory Visit (INDEPENDENT_AMBULATORY_CARE_PROVIDER_SITE_OTHER): Payer: Medicare HMO | Admitting: Nurse Practitioner

## 2023-07-18 VITALS — BP 122/64 | HR 105 | Temp 97.8°F | Ht 63.0 in | Wt 179.6 lb

## 2023-07-18 DIAGNOSIS — K219 Gastro-esophageal reflux disease without esophagitis: Secondary | ICD-10-CM | POA: Diagnosis not present

## 2023-07-18 DIAGNOSIS — E039 Hypothyroidism, unspecified: Secondary | ICD-10-CM

## 2023-07-18 DIAGNOSIS — F3340 Major depressive disorder, recurrent, in remission, unspecified: Secondary | ICD-10-CM

## 2023-07-18 DIAGNOSIS — E1169 Type 2 diabetes mellitus with other specified complication: Secondary | ICD-10-CM

## 2023-07-18 DIAGNOSIS — Z136 Encounter for screening for cardiovascular disorders: Secondary | ICD-10-CM | POA: Diagnosis not present

## 2023-07-18 DIAGNOSIS — E559 Vitamin D deficiency, unspecified: Secondary | ICD-10-CM | POA: Diagnosis not present

## 2023-07-18 DIAGNOSIS — I10 Essential (primary) hypertension: Secondary | ICD-10-CM

## 2023-07-18 DIAGNOSIS — Z79899 Other long term (current) drug therapy: Secondary | ICD-10-CM

## 2023-07-18 DIAGNOSIS — E785 Hyperlipidemia, unspecified: Secondary | ICD-10-CM | POA: Diagnosis not present

## 2023-07-18 DIAGNOSIS — K589 Irritable bowel syndrome without diarrhea: Secondary | ICD-10-CM

## 2023-07-18 DIAGNOSIS — Z0000 Encounter for general adult medical examination without abnormal findings: Secondary | ICD-10-CM

## 2023-07-18 DIAGNOSIS — N182 Chronic kidney disease, stage 2 (mild): Secondary | ICD-10-CM | POA: Diagnosis not present

## 2023-07-18 DIAGNOSIS — Z86010 Personal history of colonic polyps: Secondary | ICD-10-CM

## 2023-07-18 DIAGNOSIS — E1122 Type 2 diabetes mellitus with diabetic chronic kidney disease: Secondary | ICD-10-CM

## 2023-07-18 DIAGNOSIS — Z23 Encounter for immunization: Secondary | ICD-10-CM | POA: Diagnosis not present

## 2023-07-18 DIAGNOSIS — G4700 Insomnia, unspecified: Secondary | ICD-10-CM

## 2023-07-18 DIAGNOSIS — Z1389 Encounter for screening for other disorder: Secondary | ICD-10-CM

## 2023-07-18 DIAGNOSIS — J4520 Mild intermittent asthma, uncomplicated: Secondary | ICD-10-CM

## 2023-07-18 DIAGNOSIS — Z0001 Encounter for general adult medical examination with abnormal findings: Secondary | ICD-10-CM

## 2023-07-18 DIAGNOSIS — G4733 Obstructive sleep apnea (adult) (pediatric): Secondary | ICD-10-CM

## 2023-07-18 DIAGNOSIS — M8580 Other specified disorders of bone density and structure, unspecified site: Secondary | ICD-10-CM

## 2023-07-18 DIAGNOSIS — K760 Fatty (change of) liver, not elsewhere classified: Secondary | ICD-10-CM

## 2023-07-18 DIAGNOSIS — I700 Atherosclerosis of aorta: Secondary | ICD-10-CM

## 2023-07-18 DIAGNOSIS — M1990 Unspecified osteoarthritis, unspecified site: Secondary | ICD-10-CM

## 2023-07-18 MED ORDER — TRAZODONE HCL 50 MG PO TABS: ORAL_TABLET | ORAL | 2 refills | Status: DC

## 2023-07-18 MED ORDER — GLUCOSE BLOOD VI STRP: ORAL_STRIP | 3 refills | Status: AC

## 2023-07-18 MED ORDER — BLOOD GLUCOSE MONITORING SUPPL DEVI: 0 refills | Status: AC

## 2023-07-18 NOTE — Patient Instructions (Signed)

## 2023-07-19 ENCOUNTER — Encounter: Payer: Self-pay | Admitting: Nurse Practitioner

## 2023-07-19 ENCOUNTER — Other Ambulatory Visit: Payer: Self-pay | Admitting: Nurse Practitioner

## 2023-07-19 DIAGNOSIS — Z79899 Other long term (current) drug therapy: Secondary | ICD-10-CM

## 2023-07-19 DIAGNOSIS — E039 Hypothyroidism, unspecified: Secondary | ICD-10-CM

## 2023-07-19 DIAGNOSIS — E1122 Type 2 diabetes mellitus with diabetic chronic kidney disease: Secondary | ICD-10-CM

## 2023-07-19 DIAGNOSIS — E1129 Type 2 diabetes mellitus with other diabetic kidney complication: Secondary | ICD-10-CM

## 2023-07-19 LAB — COMPLETE METABOLIC PANEL WITH GFR
AG Ratio: 1.8 (calc) (ref 1.0–2.5)
ALT: 13 U/L (ref 6–29)
AST: 14 U/L (ref 10–35)
Albumin: 4.4 g/dL (ref 3.6–5.1)
Alkaline phosphatase (APISO): 78 U/L (ref 37–153)
BUN: 12 mg/dL (ref 7–25)
CO2: 26 mmol/L (ref 20–32)
Calcium: 9.9 mg/dL (ref 8.6–10.4)
Chloride: 99 mmol/L (ref 98–110)
Creat: 0.89 mg/dL (ref 0.60–1.00)
Globulin: 2.4 g/dL (calc) (ref 1.9–3.7)
Glucose, Bld: 85 mg/dL (ref 65–99)
Potassium: 4 mmol/L (ref 3.5–5.3)
Sodium: 136 mmol/L (ref 135–146)
Total Bilirubin: 0.5 mg/dL (ref 0.2–1.2)
Total Protein: 6.8 g/dL (ref 6.1–8.1)
eGFR: 68 mL/min/{1.73_m2} (ref >=60)

## 2023-07-19 LAB — URINALYSIS, ROUTINE W REFLEX MICROSCOPIC
Bilirubin Urine: NEGATIVE
Glucose, UA: NEGATIVE
Hgb urine dipstick: NEGATIVE
Ketones, ur: NEGATIVE
Leukocytes,Ua: NEGATIVE
Nitrite: NEGATIVE
Protein, ur: NEGATIVE
Specific Gravity, Urine: 1.011 (ref 1.001–1.035)
pH: 6.5 (ref 5.0–8.0)

## 2023-07-19 LAB — CBC WITH DIFFERENTIAL/PLATELET
Absolute Monocytes: 819 cells/uL (ref 200–950)
Basophils Absolute: 59 cells/uL (ref 0–200)
Basophils Relative: 0.5 %
Eosinophils Absolute: 246 cells/uL (ref 15–500)
Eosinophils Relative: 2.1 %
HCT: 43.2 % (ref 35.0–45.0)
Hemoglobin: 14.2 g/dL (ref 11.7–15.5)
Lymphs Abs: 3042 cells/uL (ref 850–3900)
MCH: 28.2 pg (ref 27.0–33.0)
MCHC: 32.9 g/dL (ref 32.0–36.0)
MCV: 85.7 fL (ref 80.0–100.0)
MPV: 11.1 fL (ref 7.5–12.5)
Monocytes Relative: 7 %
Neutro Abs: 7535 cells/uL (ref 1500–7800)
Neutrophils Relative %: 64.4 %
Platelets: 355 10*3/uL (ref 140–400)
RBC: 5.04 10*6/uL (ref 3.80–5.10)
RDW: 12.9 % (ref 11.0–15.0)
Total Lymphocyte: 26 %
WBC: 11.7 10*3/uL — ABNORMAL HIGH (ref 3.8–10.8)

## 2023-07-19 LAB — TSH: TSH: 0.2 mIU/L — ABNORMAL LOW (ref 0.40–4.50)

## 2023-07-19 LAB — LIPID PANEL
Cholesterol: 108 mg/dL (ref <200)
HDL: 43 mg/dL — ABNORMAL LOW (ref >=50)
LDL Cholesterol (Calc): 42 mg/dL (calc)
Non-HDL Cholesterol (Calc): 65 mg/dL (calc) (ref <130)
Total CHOL/HDL Ratio: 2.5 (calc) (ref <5.0)
Triglycerides: 147 mg/dL (ref <150)

## 2023-07-19 LAB — MAGNESIUM: Magnesium: 1.7 mg/dL (ref 1.5–2.5)

## 2023-07-19 LAB — MICROALBUMIN / CREATININE URINE RATIO
Creatinine, Urine: 87 mg/dL (ref 20–275)
Microalb Creat Ratio: 54 mg/g creat — ABNORMAL HIGH (ref <30)
Microalb, Ur: 4.7 mg/dL

## 2023-07-19 LAB — HEMOGLOBIN A1C W/OUT EAG: Hgb A1c MFr Bld: 5.8 % of total Hgb — ABNORMAL HIGH (ref <5.7)

## 2023-07-19 LAB — VITAMIN D 25 HYDROXY (VIT D DEFICIENCY, FRACTURES): Vit D, 25-Hydroxy: 91 ng/mL (ref 30–100)

## 2023-07-19 MED ORDER — KERENDIA 10 MG PO TABS
10.0000 mg | ORAL_TABLET | Freq: Every day | ORAL | 3 refills | Status: DC
Start: 2023-07-19 — End: 2023-11-07

## 2023-07-19 MED ORDER — LEVOTHYROXINE SODIUM 150 MCG PO TABS
ORAL_TABLET | ORAL | Status: DC
Start: 2023-07-19 — End: 2023-10-08

## 2023-07-19 NOTE — Telephone Encounter (Signed)
Please schedule pt a nurse visit in 1 month to recheck CMP due to starting [place]

## 2023-07-25 ENCOUNTER — Telehealth: Payer: Self-pay

## 2023-07-25 NOTE — Telephone Encounter (Signed)
Grace Barry prior [place] completed and submitted.

## 2023-07-26 ENCOUNTER — Ambulatory Visit (INDEPENDENT_AMBULATORY_CARE_PROVIDER_SITE_OTHER): Payer: Medicare HMO | Admitting: *Deleted

## 2023-07-26 DIAGNOSIS — J309 Allergic rhinitis, unspecified: Secondary | ICD-10-CM | POA: Diagnosis not present

## 2023-07-30 ENCOUNTER — Other Ambulatory Visit: Payer: Self-pay | Admitting: Nurse Practitioner

## 2023-07-30 DIAGNOSIS — I10 Essential (primary) hypertension: Secondary | ICD-10-CM

## 2023-07-30 MED ORDER — LOSARTAN POTASSIUM 100 MG PO TABS: ORAL_TABLET | ORAL | 3 refills | Status: DC

## 2023-08-07 DIAGNOSIS — J3081 Allergic rhinitis due to animal (cat) (dog) hair and dander: Secondary | ICD-10-CM | POA: Diagnosis not present

## 2023-08-07 NOTE — Progress Notes (Signed)
VIALS EXP 08-06-24

## 2023-08-08 DIAGNOSIS — J301 Allergic rhinitis due to pollen: Secondary | ICD-10-CM | POA: Diagnosis not present

## 2023-08-10 ENCOUNTER — Other Ambulatory Visit: Payer: Self-pay | Admitting: Nurse Practitioner

## 2023-08-10 DIAGNOSIS — G4700 Insomnia, unspecified: Secondary | ICD-10-CM

## 2023-08-12 DIAGNOSIS — R0902 Hypoxemia: Secondary | ICD-10-CM | POA: Diagnosis not present

## 2023-08-12 DIAGNOSIS — G4733 Obstructive sleep apnea (adult) (pediatric): Secondary | ICD-10-CM | POA: Diagnosis not present

## 2023-08-22 ENCOUNTER — Ambulatory Visit (INDEPENDENT_AMBULATORY_CARE_PROVIDER_SITE_OTHER): Payer: Self-pay | Admitting: *Deleted

## 2023-08-22 DIAGNOSIS — J309 Allergic rhinitis, unspecified: Secondary | ICD-10-CM | POA: Diagnosis not present

## 2023-08-27 ENCOUNTER — Ambulatory Visit (INDEPENDENT_AMBULATORY_CARE_PROVIDER_SITE_OTHER): Payer: Medicare HMO

## 2023-08-27 DIAGNOSIS — Z79899 Other long term (current) drug therapy: Secondary | ICD-10-CM | POA: Diagnosis not present

## 2023-08-28 ENCOUNTER — Other Ambulatory Visit: Payer: Self-pay | Admitting: Nurse Practitioner

## 2023-08-28 LAB — COMPLETE METABOLIC PANEL WITH GFR
AG Ratio: 1.9 (calc) (ref 1.0–2.5)
ALT: 16 U/L (ref 6–29)
AST: 17 U/L (ref 10–35)
Albumin: 4.2 g/dL (ref 3.6–5.1)
Alkaline phosphatase (APISO): 77 U/L (ref 37–153)
BUN: 15 mg/dL (ref 7–25)
CO2: 27 mmol/L (ref 20–32)
Calcium: 10 mg/dL (ref 8.6–10.4)
Chloride: 96 mmol/L — ABNORMAL LOW (ref 98–110)
Creat: 0.83 mg/dL (ref 0.60–1.00)
Globulin: 2.2 g/dL (ref 1.9–3.7)
Glucose, Bld: 81 mg/dL (ref 65–99)
Potassium: 4.7 mmol/L (ref 3.5–5.3)
Sodium: 133 mmol/L — ABNORMAL LOW (ref 135–146)
Total Bilirubin: 0.5 mg/dL (ref 0.2–1.2)
Total Protein: 6.4 g/dL (ref 6.1–8.1)
eGFR: 74 mL/min/{1.73_m2} (ref >=60)

## 2023-08-30 ENCOUNTER — Ambulatory Visit: Payer: Medicare HMO | Attending: Cardiology | Admitting: Cardiology

## 2023-08-30 ENCOUNTER — Encounter: Payer: Self-pay | Admitting: Cardiology

## 2023-08-30 VITALS — BP 124/84 | HR 100 | Ht 63.0 in | Wt 173.6 lb

## 2023-08-30 DIAGNOSIS — Q245 Malformation of coronary vessels: Secondary | ICD-10-CM

## 2023-08-30 DIAGNOSIS — I10 Essential (primary) hypertension: Secondary | ICD-10-CM

## 2023-08-30 DIAGNOSIS — E785 Hyperlipidemia, unspecified: Secondary | ICD-10-CM

## 2023-08-30 DIAGNOSIS — R0600 Dyspnea, unspecified: Secondary | ICD-10-CM

## 2023-08-30 NOTE — Patient Instructions (Signed)
Medication Instructions:  No changes *If you need a refill on your cardiac medications before your next appointment, please call your pharmacy*  Testing/Procedures: Your physician has requested that you have an echocardiogram. Echocardiography is a painless test that uses sound waves to create images of your heart. It provides your doctor with information about the size and shape of your heart and how well your heart's chambers and valves are working. This procedure takes approximately one hour. There are no restrictions for this procedure. Please do NOT wear cologne, perfume, aftershave, or lotions (deodorant is allowed). Please arrive 15 minutes prior to your appointment time.    Follow-Up: At [facility], you and your health needs are our priority.  As part of our continuing mission to provide you with exceptional heart care, we have created designated Provider Care Teams.  These Care Teams include your primary Cardiologist (physician) and Advanced Practice Providers (APPs -  Physician Assistants and Nurse Practitioners) who all work together to provide you with the care you need, when you need it.  We recommend signing up for the patient portal called "MyChart".  Sign up information is provided on this After Visit Summary.  MyChart is used to connect with patients for Virtual Visits (Telemedicine).  Patients are able to view lab/test results, encounter notes, upcoming appointments, etc.  Non-urgent messages can be sent to your provider as well.   To learn more about what you can do with MyChart, go to [email or web address].    Your next appointment:   1 year(s)  Provider:   Dr Bjorn Pippin

## 2023-08-31 ENCOUNTER — Encounter: Payer: Self-pay | Admitting: Neurology

## 2023-09-03 ENCOUNTER — Other Ambulatory Visit: Payer: Self-pay | Admitting: Nurse Practitioner

## 2023-09-03 DIAGNOSIS — G4733 Obstructive sleep apnea (adult) (pediatric): Secondary | ICD-10-CM

## 2023-09-03 NOTE — Telephone Encounter (Signed)
I put in the referral for a new sleep study

## 2023-09-12 DIAGNOSIS — R0902 Hypoxemia: Secondary | ICD-10-CM | POA: Diagnosis not present

## 2023-09-12 DIAGNOSIS — G4733 Obstructive sleep apnea (adult) (pediatric): Secondary | ICD-10-CM | POA: Diagnosis not present

## 2023-09-17 NOTE — Telephone Encounter (Signed)
Can you check on this?

## 2023-09-17 NOTE — Telephone Encounter (Signed)
Can you call her?

## 2023-09-24 ENCOUNTER — Ambulatory Visit (INDEPENDENT_AMBULATORY_CARE_PROVIDER_SITE_OTHER): Payer: Self-pay | Admitting: *Deleted

## 2023-09-24 DIAGNOSIS — J309 Allergic rhinitis, unspecified: Secondary | ICD-10-CM

## 2023-10-03 ENCOUNTER — Other Ambulatory Visit: Payer: Self-pay | Admitting: *Deleted

## 2023-10-03 ENCOUNTER — Ambulatory Visit (INDEPENDENT_AMBULATORY_CARE_PROVIDER_SITE_OTHER): Payer: Self-pay

## 2023-10-03 ENCOUNTER — Encounter: Payer: Self-pay | Admitting: Allergy & Immunology

## 2023-10-03 ENCOUNTER — Encounter: Payer: Self-pay | Admitting: Neurology

## 2023-10-03 DIAGNOSIS — J309 Allergic rhinitis, unspecified: Secondary | ICD-10-CM

## 2023-10-03 MED ORDER — EPINEPHRINE 0.3 MG/0.3ML IJ SOAJ: 0.3000 mg | INTRAMUSCULAR | 1 refills | Status: DC | PRN

## 2023-10-04 ENCOUNTER — Encounter: Payer: Self-pay | Admitting: Neurology

## 2023-10-04 ENCOUNTER — Ambulatory Visit (INDEPENDENT_AMBULATORY_CARE_PROVIDER_SITE_OTHER): Payer: Medicare HMO | Admitting: Neurology

## 2023-10-04 VITALS — BP 121/74 | HR 106 | Ht 63.0 in | Wt 168.0 lb

## 2023-10-04 DIAGNOSIS — M797 Fibromyalgia: Secondary | ICD-10-CM | POA: Diagnosis not present

## 2023-10-04 DIAGNOSIS — E039 Hypothyroidism, unspecified: Secondary | ICD-10-CM

## 2023-10-04 DIAGNOSIS — G43009 Migraine without aura, not intractable, without status migrainosus: Secondary | ICD-10-CM | POA: Diagnosis not present

## 2023-10-04 DIAGNOSIS — R251 Tremor, unspecified: Secondary | ICD-10-CM | POA: Diagnosis not present

## 2023-10-04 DIAGNOSIS — G4752 REM sleep behavior disorder: Secondary | ICD-10-CM | POA: Diagnosis not present

## 2023-10-04 DIAGNOSIS — G9332 Myalgic encephalomyelitis/chronic fatigue syndrome: Secondary | ICD-10-CM | POA: Diagnosis not present

## 2023-10-04 DIAGNOSIS — G4711 Idiopathic hypersomnia with long sleep time: Secondary | ICD-10-CM

## 2023-10-04 NOTE — Progress Notes (Signed)
SLEEP MEDICINE CLINIC    Provider:  Melvyn Novas, MD  Primary Care Physician:  Lucky Cowboy, MD [address] [place] [address]     Referring Provider: [facility] from Dr Lesia Sago, MD via Loni Dolly         Chief Complaint according to patient   Patient presents with:     New Patient (Initial Visit) TOC , originally Dr. Anne Hahn., assigned by Margie Ege, NP            HISTORY OF PRESENT ILLNESS:  Grace Barry is a 72 y.o. female patient who is seen upon referral on 10/04/2023 from [facility] Margie Ege, NP ,  for a new evaluation of hypersomnia.    Chief concern according to patient :  chronic insomnia, hard time falling asleep. And waking up tired, all day fatigued and excessively daytime sleepy. Apple watch shows early REM onset, within 30 minutes of falling asleep.  Moderately fragmented, she sleeps 8-10 hors and is not refreshed.  She underwent a Sleep study here at [facility] Mrs. Sobecki presented in October 2021 so a little over 3 years ago with a history of respiratory allergies, history of anxiety, arthritis asthma cataract stage II mild kidney disease, GERD, hyperlipidemia hypertension and irritable bowel syndrome.  She also has a history of migraine for which she is still followed here at [facility] by Margie Ege, a history of type 2 diabetes mellitus.  She had a ventral hernia, and she had spells of vertigo which were noted in April 2016 in her medical record.  She had mild apnea which was diagnosed in the past and was using a dental device provided by Dr. Irene Limbo but it did not change her sleepiness and daytime.  So the study was performed while she uses a dental device approximately half of the night and she changed at 3:10 AM there was actually more apnea and hypoxia present after the patient began using a dental device at night that could be an indicator that she entered REM sleep only after the first 2 hours of sleep and is in contrast to what her current  Apple Watch recording sats.  According to her Apple Watch she has early REM sleep onset.  Her heart rate was in normal range and she did have an total oxygen saturation of 145 minutes at night she is currently on oxygen at night at home.  Her RDI at the time was 13.6 her AHI was 10.6 which is very mild apnea, following a 4% CMS based scoring her AHI would have been less than 10.  Her study had also indicated the presence of 10% central apneas.  She had a valid sleep time of 8 hours 29 minutes so insomnia was not the problem.  Her AHI was significantly increased when she slept on her back so there was a supine positional dependent apnea noted.  We then repeated an in-lab sleep study which was done without the dental device in place and truly she had no apnea.  Only mild snoring and her persistent hypoxemia which has been supplemented by oxygen.  So at this time her depression score is very low 2 out of 15 points which was different 4 years ago.  Her Epworth sleepiness scale remains high at 12 out of 24 points and her fatigue severity is very high at 60 out of 63 points.  So I am not sure if we have a primary sleep disorder here and my main  goal is to reevaluate and see if apnea may have developed in the meantime.  Cardiologist  Dr. Bjorn Pippin, , MD recommended a repeat sleep study. 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    I have the pleasure of seeing Grace Barry again on  10/04/23 , a right-handed female with a possible sleep disorder.  She is now 72 years-old. Her next sleep study will be scored by CMS criteria and performed on oxygen .       Sleep relevant medical history: Nocturia 1-2 times with no difficulties to go back to sleep, insomnia for sleep initiation- psychological.  Sleep hypoxia, sleep talking, REM BD other Parasomnia ( screaming out in her dreams) ,  Tonsillectomy at age 69 ,  cervical spine . Acquired  hypothyroidism.  deviated septum , sphenoid sinus  opening - surgery 25 years ago.   Family medical /sleep history:  son had night terrors and sleep walking.  Had 2 sone, Son died of  esophageal cancer 10 years ago 11-10-2013 in [place].    Social history:  Patient is married , retired from Magazine features editor and lives in a household with spouse , One dog  Tobacco use; none .  ETOH use ; none , Caffeine intake in form of Coffee( rare ) Soda( coke when eating out ) Tea ( /) no energy drinks  Sleep habits are as follows: The patient's dinner time is between 6.30 PM. The patient goes to bed at 11 PM and takes no sleep aids - she tried xanax, Ambien, benadryl, trazodone- continues to sleep for 7-10 hours, wakes for 1 bathroom break, the sleeps on one pillow with Oxygen 2 l /minute   The preferred sleep position is lateral, with the support of an adjustable bed 15 degrees.   Dreams are reportedly frequent/vivid and enacted - can be once a month.   The patient wakes up spontaneously 10 AM , earlier with an alarm. 9.30  AM is the usual rise time. She reports not feeling refreshed or restored in AM, with symptoms such as dry mouth, some times sinus type morning headaches, and residual fatigue.  Naps are taken infrequently, she actively avoid these. Naps were not refreshing and interfered with  nocturnal sleep.    Review of Systems: Out of a complete 14 system review, the patient complains of only the following symptoms, and all other reviewed systems are negative.:  Fatigue, sleepiness , no snoring, mild- moderately fragmented sleep, Insomnia,REM BD   How likely are you to doze in the following situations: 0 = not likely, 1 = slight Crutcher, 2 = moderate Pring, 3 = high Hancher   Sitting and Reading? Watching Television? Sitting inactive in a public place (theater or meeting)? As a passenger in a car for an hour without a break? Lying down in the afternoon when circumstances permit? Sitting and talking to someone? Sitting quietly after lunch without  alcohol? In a car, while stopped for a few minutes in traffic?   Total = 12-13 / 24 points   FSS endorsed at 60 / 63 points.   Social History   Socioeconomic History   Marital status: Married    Spouse name: Fredrik Cove   Number of children: 2   Years of education: masters   Highest education level: Not on file  Occupational History   Occupation: Retired Runner, broadcasting/film/video  Tobacco Use   Smoking status: Never   Smokeless tobacco: Never  Vaping Use   Vaping status: Never Used  Substance  and Sexual Activity   Alcohol use: Yes    Alcohol/week: 0.0 standard drinks of alcohol    Comment: occasional   Drug use: No   Sexual activity: Not on file  Other Topics Concern   Not on file  Social History Narrative   Lives with husband, Fredrik Cove   Patient is right handed.   Patient drinks 64 oz of diet soda daily.   Social Determinants of Health   Financial Resource Strain: Not on file  Food Insecurity: Not on file  Transportation Needs: Not on file  Physical Activity: Not on file  Stress: Not on file  Social Connections: Not on file    Family History  Problem Relation Age of Onset   Breast cancer Mother    Hypertension Father    Hyperlipidemia Father    Prostate cancer Father    Hyperlipidemia Sister    Stomach cancer Son    Esophageal cancer Son    Colon cancer Son    Colon polyps Neg Hx    Diabetes Neg Hx    Kidney disease Neg Hx    Migraines Neg Hx    Rectal cancer Neg Hx     Past Medical History:  Diagnosis Date   Allergy    Anxiety    Arthritis    Cataract    Chronic kidney disease (CKD), stage II (mild) 07/01/2014   Overview:  Overview:  Due to DM, last GFR 64    Degenerative tear of triangular fibrocartilage complex (TFCC) of left wrist 01/17/2019   Depression    Fibromyalgia    GERD (gastroesophageal reflux disease)    Hyperlipidemia    Hypertension    Hypothyroidism    Hypoxemia    IBS (irritable bowel syndrome)    Liver disease    Migraine 02/19/2015   Migraines     Obesity    Osteoporosis    Oxygen deficiency    wears oxygen at noc- 2 liters   Pneumonia    Sensorineural hearing loss (SNHL) of left ear with unrestricted hearing of right ear 12/31/2017   Sleep apnea    uses oral device none   Sleep difficulties    Thyroid disease    hypothyroidism   Tuberculosis    positive PPD test, chest x ray negative   Type II or unspecified type diabetes mellitus without mention of complication, not stated as uncontrolled    Urticaria    Ventral hernia    Vertigo 02/19/2015    Past Surgical History:  Procedure Laterality Date   ABDOMINAL HYSTERECTOMY     ADENOIDECTOMY     CARPAL TUNNEL RELEASE     bilateral   CATARACT EXTRACTION, BILATERAL     CESAREAN SECTION     x2   CHOLECYSTECTOMY     COLONOSCOPY     COLONOSCOPY WITH PROPOFOL N/A 05/07/2023   Procedure: COLONOSCOPY WITH PROPOFOL;  Surgeon: Napoleon Form, MD;  Location: [facility];  Service: Gastroenterology;  Laterality: N/A;   HERNIA REPAIR  07/24/11   ventral hernia   JOINT REPLACEMENT     bilateral knees   KNEE ARTHROSCOPY     bilateral   POLYPECTOMY  05/07/2023   Procedure: POLYPECTOMY;  Surgeon: Napoleon Form, MD;  Location: [facility];  Service: Gastroenterology;;   SINOSCOPY     TONSILLECTOMY     TOTAL KNEE ARTHROPLASTY     bilateral     Current Outpatient Medications on File Prior to Visit  Medication Sig Dispense Refill   aspirin  81 MG tablet Take 81 mg by mouth daily.     azelastine (ASTELIN) 0.1 % nasal spray Place 1 spray into both nostrils 2 (two) times daily. Use in each nostril as directed (Patient taking differently: Place 1 spray into both nostrils daily. Use in each nostril as directed) 30 mL 5   baclofen (LIORESAL) 10 MG tablet TAKE 1 TABLET BY MOUTH TWICE A DAY AS NEEDED FOR MUSCLE SPASMS 180 tablet 1   Blood Glucose Monitoring Suppl DEVI Use glucometer to check blood sugars once a day 1 Dose 0   CALCIUM PO Take 1,200 mg by mouth daily.      Cholecalciferol (VITAMIN D3) 125 MCG (5000 UT) CAPS Take  1 capsule Daily     Cyanocobalamin (VITAMIN B12 SL) Place 500 mcg under the tongue daily.     DULoxetine (CYMBALTA) 60 MG capsule TAKE 1 CAPSULE DAILY FOR CHRONIC PAIN SYNDROME. 90 capsule 3   EPINEPHrine 0.3 mg/0.3 mL IJ SOAJ injection Inject 0.3 mg into the muscle once.     famotidine (PEPCID) 40 MG tablet TAKE 1 TABLET EVERY 12 HOURS TO PREVENT HEARTBURN & INDIGESTION 180 tablet 1   Finerenone (KERENDIA) 10 MG TABS Take 1 tablet (10 mg total) by mouth daily. 30 tablet 3   fluticasone (FLONASE) 50 MCG/ACT nasal spray Place 1-2 sprays into both nostrils at bedtime. (Patient taking differently: Place 1 spray into both nostrils at bedtime.) 48 mL 3   glucose blood (ONE TOUCH ULTRA TEST) test strip Check Blood sugar Daily for diabetes 100 each 3   Lancets (ONETOUCH ULTRASOFT) lancets Check Blood sugar  daily 100 each 3   levothyroxine (SYNTHROID) 150 MCG tablet Take  1 tablet  Daily except Sunday take 1/2 tab  on an empty stomach with only water for 30 minutes & no Antacid meds, Calcium or Magnesium for 4 hours & avoid Biotin     loperamide (IMODIUM) 2 MG capsule Take 1 capsule (2 mg total) by mouth as needed for diarrhea or loose stools. 30 capsule 0   losartan (COZAAR) 100 MG tablet TAKE 1 TABLET BY MOUTH DAILY FOR BLOOD PRESSURE AND KIDNEY PROTECTION 90 tablet 3   Magnesium 250 MG TABS Take 250 mg by mouth 2 (two) times daily.     metFORMIN (GLUCOPHAGE-XR) 500 MG 24 hr tablet Take  2 tablets   2 x /day   with Meals for Diabetes 360 tablet 3   metoprolol succinate (TOPROL-XL) 50 MG 24 hr tablet Take 1 tablet (50 mg total) by mouth daily. Take with or immediately following a meal. 90 tablet 3   montelukast (SINGULAIR) 10 MG tablet TAKE 1 TABLET BY MOUTH EVERYDAY AT BEDTIME 90 tablet 1   Multiple Vitamins-Minerals (MULTIVITAMIN PO) Take 1 tablet by mouth daily.      nortriptyline (PAMELOR) 10 MG capsule TAKE 1 CAPSULE BY MOUTH EVERYDAY AT  BEDTIME 90 capsule 3   Omega-3 Fatty Acids (FISH OIL PO) Take 600 mg by mouth daily.     omeprazole (PRILOSEC) 20 MG capsule TAKE 1 CAPSULE DAILY TO PREVENT HEARTBURN & ACID REFLUX 90 capsule 1   PRESCRIPTION MEDICATION Inject as directed every 30 (thirty) days. Allergy Shot     rosuvastatin (CRESTOR) 10 MG tablet TAKE 1 TABLET BY MOUTH EVERY DAY FOR CHOLESTEROL 90 tablet 3   SHINGRIX injection      tirzepatide (MOUNJARO) 15 MG/0.5ML Pen Inject 15 mg into the skin every 7 (seven) days for diabetes. 6 mL 1   Turmeric (QC TUMERIC  COMPLEX PO) Take 1 tablet by mouth daily.     EPINEPHrine 0.3 mg/0.3 mL IJ SOAJ injection Inject 0.3 mg into the muscle as needed for anaphylaxis. (Patient not taking: Reported on 10/04/2023) 2 each 1   No current facility-administered medications on file prior to visit.    Allergies  Allergen Reactions   Floxin [Ofloxacin] Hives   Invokana [Canagliflozin]     Hematuria- avoid class   Viberzi [Eluxadoline] Hives     DIAGNOSTIC DATA (LABS, IMAGING, TESTING) - I reviewed patient records, labs, notes, testing and imaging myself where available.  Lab Results  Component Value Date   WBC 11.7 (H) 07/18/2023   HGB 14.2 07/18/2023   HCT 43.2 07/18/2023   MCV 85.7 07/18/2023   PLT 355 07/18/2023      Component Value Date/Time   NA 133 (L) 08/27/2023 1434   NA 140 07/02/2019 1214   K 4.7 08/27/2023 1434   CL 96 (L) 08/27/2023 1434   CO2 27 08/27/2023 1434   GLUCOSE 81 08/27/2023 1434   BUN 15 08/27/2023 1434   BUN 11 07/02/2019 1214   CREATININE 0.83 08/27/2023 1434   CALCIUM 10.0 08/27/2023 1434   PROT 6.4 08/27/2023 1434   ALBUMIN 4.1 10/19/2020 1356   AST 17 08/27/2023 1434   ALT 16 08/27/2023 1434   ALKPHOS 73 10/19/2020 1356   BILITOT 0.5 08/27/2023 1434   GFRNONAA 82 12/22/2020 1625   GFRAA 94 12/22/2020 1625   Lab Results  Component Value Date   CHOL 108 07/18/2023   HDL 43 (L) 07/18/2023   LDLCALC 42 07/18/2023   TRIG 147 07/18/2023    CHOLHDL 2.5 07/18/2023   Lab Results  Component Value Date   HGBA1C 5.8 (H) 07/18/2023   Lab Results  Component Value Date   VITAMINB12 396 05/18/2022   Lab Results  Component Value Date   TSH 0.20 (L) 07/18/2023    PHYSICAL EXAM:  Today's Vitals   10/04/23 1143  BP: 121/74  Pulse: (!) 106  Weight: 168 lb (76.2 kg)  Height: 5\' 3"  (1.6 m)   Body mass index is 29.76 kg/m.   Wt Readings from Last 3 Encounters:  10/04/23 168 lb (76.2 kg)  08/30/23 173 lb 9.6 oz (78.7 kg)  08/27/23 177 lb 9.6 oz (80.6 kg)     Ht Readings from Last 3 Encounters:  10/04/23 5\' 3"  (1.6 m)  08/30/23 5\' 3"  (1.6 m)  07/18/23 5\' 3"  (1.6 m)      General: The patient is awake, alert and appears not in acute distress. The patient is well groomed. Head: Normocephalic, atraumatic. Neck is supple. Mallampati 3,  neck circumference:16 inches . Nasal airflow  patent.  Retrognathia is  seen.  Dental status:  Cardiovascular:  Regular rate and cardiac rhythm by pulse,  without distended neck veins. Respiratory: Lungs are clear to auscultation.  Skin:  With evidence of ankle edema, or rash. Trunk: The patient's posture is erect.   NEUROLOGIC EXAM: The patient is awake and alert, oriented to place and time.   Memory subjective described as intact.  Attention span & concentration ability appears normal.  Speech is fluent,  without  dysarthria, dysphonia or aphasia.  Mood and affect are appropriate.   Cranial nerves: no loss of smell or taste reported  Pupils are equal and briskly reactive to light. Funduscopic exam deferred. .  Extraocular movements in vertical and horizontal planes were intact and without nystagmus. No Diplopia. Visual fields by finger perimetry are intact.  Hearing was intact on the right and impaired on the left to soft voice and finger rubbing.    Facial sensation intact to fine touch.  Facial motor strength is symmetric and tongue and uvula move midline.  Neck ROM : rotation,  tilt and flexion extension were normal for age and shoulder shrug was symmetrical.    Motor exam:  Symmetric bulk but right sided elevated biceps tone. Not a clear  cog- wheeling, and preserved symmetric grip strength . Mild resting tremor in both hands, much exacerbated with action. Right isided dysmetria seen.    Sensory:  Fine touch, pinprick and vibration were tested  and  normal.  Proprioception tested in the upper extremities was normal.   Coordination: Rapid alternating movements in the fingers/hands were of normal speed.  The Finger-to-nose maneuver was intact without evidence of ataxia,  right sided dysmetria bilateral action tremor.   Gait and station: Patient could rise unassisted from a seated position, walked without assistive device.  Stance is of normal width/ base and the patient turned  3 steps. Toe and heel walk were deferred.  Deep tendon reflexes: in the  upper and lower extremities are symmetric and intact.  Babinski response was deferred .    ASSESSMENT AND PLAN 72 y.o. year old female  here with:    1) persistent daytime sleepiness in the setting of sleep initiation insomnia, ( yet  sleeping up to 10 hours = idiopathic) but also REM BD reported and she is on nocturnal oxygen.    This patient has been very fatigued much of her adult life.  She received a dx of chronic pain, Fibromyalgia.  Has never seen rheumatology, stays on Cymbalta.    2) I need to repeat an in lab sleep study and encourage to use oxygen from the start. Oxygen use is certified and ordered through cardiologist/ pulmonologist.   My goal is to see REM sleep latency and REm BD evidence.  I will order an autoimmune panel today.     No orders of the defined types were placed in this encounter.    Follow up through our NP within 6 months.   I would like to thank Lucky Cowboy, MD and Lucky Cowboy, Md [address] [place],  [place] [address] for allowing me to meet  with and to take care of this pleasant patient.   CC: I will share my notes with PCP.  After spending a total time of  47  minutes face to face and additional time for physical and neurologic examination, review of laboratory studies,  personal review of imaging studies, reports and results of other testing and review of referral information / records as far as provided in visit,   Electronically signed by: Melvyn Novas, MD 10/04/2023 12:08 PM  [facility] Board certified by The [employer] of Sleep Medicine and Diplomate of the [employer] of Sleep Medicine. Board certified In Neurology through the ABPN, Fellow of the [employer] of Neurology.

## 2023-10-04 NOTE — Patient Instructions (Signed)
Hypersomnia Hypersomnia is a condition in which a person feels very tired during the day even though the person gets plenty of sleep at night. A person with this condition may take naps during the day and may find it very difficult to wake up from sleep. Hypersomnia may affect a person's ability to think, concentrate, drive, or remember things. What are the causes? The cause of this condition may not be known. Possible causes include: Taking certain medicines. Using drugs or alcohol. Sleep disorders, such as narcolepsy and sleep apnea. Injury to the head, brain, or spinal cord. Tumors. Certain medical conditions. These include: Depression. Diabetes. Gastroesophageal reflux disease (GERD). An underactive thyroid gland (hypothyroidism). What are the signs or symptoms? The main symptoms of hypersomnia include: Feeling very tired throughout the day, regardless of how much sleep you got the night before. Having trouble waking up. Others may find it difficult to wake you up when you are sleeping. Sleeping for longer and longer periods at a time. Taking naps throughout the day. Other symptoms may include: Feeling restless, anxious, or annoyed. Lacking energy. Having trouble with: Remembering. Speaking. Thinking. Loss of appetite. Seeing, hearing, tasting, smelling, or feeling things that are not real (hallucinations). How is this diagnosed? This condition may be diagnosed based on: Your symptoms and medical history. Your sleeping habits. Your health care provider may ask you to write down your sleeping habits in a daily sleep log, along with any symptoms you have. A series of tests that are done while you sleep (sleep study or polysomnogram). A test that measures how quickly you can fall asleep during the day (daytime nap study or multiple sleep latency test). How is this treated? This condition may be treated by: Following a regular sleep routine. Making lifestyle changes, such as  changing your eating habits, getting regular exercise, and avoiding alcohol or caffeinated beverages. Taking medicines to make you more alert (stimulants) during the day. Treating any underlying medical causes of hypersomnia. Follow these instructions at home: Sleep habits Stick to a routine that includes going to bed and waking up at the same times every day and night. Practice a relaxing bedtime routine. This may include reading, meditation, deep breathing, or taking a warm bath before going to sleep. Exercise regularly as told by your health care provider. However, avoid exercising in the hours right before bedtime. Keep your sleep environment at a cooler temperature, darkened, and quiet. Sleep with pillows and a mattress that are comfortable and supportive. Schedule short 20-minute naps for when you feel sleepiest during the day. Talk with your employer or teachers about your hypersomnia. If possible, adjust your schedule so that: You have a regular daytime work schedule. You can take a scheduled nap during the day. You do not have to work or be active at night. Do not eat a heavy meal for a few hours before bedtime. Eat your meals at about the same times every day. Safety  Do not drive or use machinery if you are sleepy. Ask your health care provider if it is safe for you to drive. Wear a life jacket when swimming or spending time near water. General instructions  Take over-the-counter and prescription medicines only as told by your health care provider. This includes supplements. Avoid drinking alcohol or caffeinated beverages. Keep a sleep log that will help your health care provider manage your condition. This may include information about: What time you go to bed each night. How often you wake up at night. How many hours   you sleep at night. How often and for how long you nap during the day. Any observations from others, such as leg movements during sleep, sleep walking, or  snoring. Keep all follow-up visits. This is important. Contact a health care provider if: You have new symptoms. Your symptoms get worse. Get help right away if: You have thoughts about hurting yourself or someone else. Get help right away if you feel like you may hurt yourself or others, or have thoughts about taking your own life. Go to your nearest emergency room or: Call 911. Call the National Suicide Prevention Lifeline at (614)428-1551 or 988. This is open 24 hours a day. Text the Crisis Text Line at (307)023-4669. Summary Hypersomnia refers to a condition in which you feel very tired during the day even though you get plenty of sleep at night. A person with this condition may take naps during the day and may find it very difficult to wake up from sleep. Hypersomnia may affect a person's ability to think, concentrate, drive, or remember things. Treatment may include a regular sleep routine and making some lifestyle changes. This information is not intended to replace advice given to you by your health care provider. Make sure you discuss any questions you have with your health care provider. Document Revised: 09/26/2021 Document Reviewed: 09/26/2021 Elsevier Patient Education  2024 [employer].

## 2023-10-05 ENCOUNTER — Encounter: Payer: Self-pay | Admitting: Nurse Practitioner

## 2023-10-06 LAB — ANA W/REFLEX: ANA Titer 1: NEGATIVE

## 2023-10-08 ENCOUNTER — Telehealth: Payer: Self-pay

## 2023-10-08 ENCOUNTER — Other Ambulatory Visit: Payer: Self-pay | Admitting: Nurse Practitioner

## 2023-10-08 ENCOUNTER — Telehealth: Payer: Self-pay | Admitting: Neurology

## 2023-10-08 DIAGNOSIS — E039 Hypothyroidism, unspecified: Secondary | ICD-10-CM

## 2023-10-08 MED ORDER — LEVOTHYROXINE SODIUM 125 MCG PO TABS: 125.0000 ug | ORAL_TABLET | Freq: Every day | ORAL | 11 refills | Status: DC

## 2023-10-08 NOTE — Telephone Encounter (Signed)
Called patient to informed her of the CBC results. Pt verbalized understanding. Pt had no questions at this time but was encouraged to call back if questions arise.

## 2023-10-08 NOTE — Telephone Encounter (Signed)
[employer] medicare pending uploaded notes & [employer]BS [place] no auth req

## 2023-10-08 NOTE — Telephone Encounter (Signed)
-----   Message from Chuichu Dohmeier sent at 10/06/2023 12:40 PM EST ----- White blood cell count is still elevated , and has been for a year.  An elevated Neutrophil WBC is associated with bacterial infections and lymphocytic WBC are elevated in viral infections.  Vit B 12 is level was high, and supplements should be reduced or paused.

## 2023-10-09 ENCOUNTER — Other Ambulatory Visit: Payer: Self-pay | Admitting: Nurse Practitioner

## 2023-10-09 ENCOUNTER — Ambulatory Visit (HOSPITAL_COMMUNITY): Payer: Medicare HMO | Attending: Cardiology

## 2023-10-09 DIAGNOSIS — R0600 Dyspnea, unspecified: Secondary | ICD-10-CM | POA: Diagnosis not present

## 2023-10-09 DIAGNOSIS — D721 Eosinophilia, unspecified: Secondary | ICD-10-CM

## 2023-10-09 LAB — ECHOCARDIOGRAM COMPLETE
Area-P 1/2: 4.74 cm2
S' Lateral: 2.7 cm

## 2023-10-09 NOTE — Telephone Encounter (Signed)
Please schedule nurse visit in 2 weeks for repeat CBC

## 2023-10-10 NOTE — Telephone Encounter (Signed)
[employer]PS[employer]- [employer] medicare [place]: [id number] (exp. 10/08/23 to 04/05/24) & BCBS state no auth req   Patient is scheduled at [facility] for 10/15/23 at 8 pm  I informed for patient to to bring something comfortable to sleep in, bring any medication she takes at night or early morning and also eat her even meal before arriving.

## 2023-10-11 ENCOUNTER — Ambulatory Visit: Payer: BC Managed Care – PPO | Admitting: Internal Medicine

## 2023-10-11 ENCOUNTER — Ambulatory Visit (INDEPENDENT_AMBULATORY_CARE_PROVIDER_SITE_OTHER): Payer: Medicare HMO | Admitting: *Deleted

## 2023-10-11 DIAGNOSIS — J309 Allergic rhinitis, unspecified: Secondary | ICD-10-CM

## 2023-10-12 DIAGNOSIS — R0902 Hypoxemia: Secondary | ICD-10-CM | POA: Diagnosis not present

## 2023-10-12 DIAGNOSIS — G4733 Obstructive sleep apnea (adult) (pediatric): Secondary | ICD-10-CM | POA: Diagnosis not present

## 2023-10-14 ENCOUNTER — Other Ambulatory Visit: Payer: Self-pay | Admitting: Nurse Practitioner

## 2023-10-14 ENCOUNTER — Other Ambulatory Visit: Payer: Self-pay | Admitting: Internal Medicine

## 2023-10-14 DIAGNOSIS — E1122 Type 2 diabetes mellitus with diabetic chronic kidney disease: Secondary | ICD-10-CM

## 2023-10-14 DIAGNOSIS — K219 Gastro-esophageal reflux disease without esophagitis: Secondary | ICD-10-CM

## 2023-10-15 ENCOUNTER — Ambulatory Visit (INDEPENDENT_AMBULATORY_CARE_PROVIDER_SITE_OTHER): Payer: Medicare HMO | Admitting: Neurology

## 2023-10-15 DIAGNOSIS — G4711 Idiopathic hypersomnia with long sleep time: Secondary | ICD-10-CM

## 2023-10-15 DIAGNOSIS — M797 Fibromyalgia: Secondary | ICD-10-CM

## 2023-10-15 DIAGNOSIS — F515 Nightmare disorder: Secondary | ICD-10-CM | POA: Diagnosis not present

## 2023-10-15 DIAGNOSIS — R251 Tremor, unspecified: Secondary | ICD-10-CM

## 2023-10-15 DIAGNOSIS — G9332 Fibromyalgia: Secondary | ICD-10-CM

## 2023-10-15 DIAGNOSIS — G4752 REM sleep behavior disorder: Secondary | ICD-10-CM

## 2023-10-15 DIAGNOSIS — Z9981 Dependence on supplemental oxygen: Secondary | ICD-10-CM

## 2023-10-16 ENCOUNTER — Telehealth: Payer: Self-pay

## 2023-10-16 LAB — CBC WITH DIFFERENTIAL/PLATELET
Basophils Absolute: 0.1 10*3/uL (ref 0.0–0.2)
Basos: 1 %
EOS (ABSOLUTE): 0.2 10*3/uL (ref 0.0–0.4)
Eos: 2 %
Hematocrit: 46.4 % (ref 34.0–46.6)
Hemoglobin: 14.4 g/dL (ref 11.1–15.9)
Immature Grans (Abs): 0 10*3/uL (ref 0.0–0.1)
Immature Granulocytes: 0 %
Lymphocytes Absolute: 3.2 10*3/uL — ABNORMAL HIGH (ref 0.7–3.1)
Lymphs: 28 %
MCH: 27.1 pg (ref 26.6–33.0)
MCHC: 31 g/dL — ABNORMAL LOW (ref 31.5–35.7)
MCV: 87 fL (ref 79–97)
Monocytes Absolute: 0.7 10*3/uL (ref 0.1–0.9)
Monocytes: 6 %
Neutrophils Absolute: 7.2 10*3/uL — ABNORMAL HIGH (ref 1.4–7.0)
Neutrophils: 63 %
Platelets: 380 10*3/uL (ref 150–450)
RBC: 5.32 x10E6/uL — ABNORMAL HIGH (ref 3.77–5.28)
RDW: 14.2 % (ref 11.7–15.4)
WBC: 11.4 10*3/uL — ABNORMAL HIGH (ref 3.4–10.8)

## 2023-10-16 LAB — C-REACTIVE PROTEIN: CRP: <1 mg/L (ref 0–10)

## 2023-10-16 LAB — COMPREHENSIVE METABOLIC PANEL
ALT: 19 [IU]/L (ref 0–32)
AST: 18 [IU]/L (ref 0–40)
Albumin: 4.6 g/dL (ref 3.8–4.8)
Alkaline Phosphatase: 88 [IU]/L (ref 44–121)
BUN/Creatinine Ratio: 16 (ref 12–28)
BUN: 13 mg/dL (ref 8–27)
Bilirubin Total: 0.4 mg/dL (ref 0.0–1.2)
CO2: 22 mmol/L (ref 20–29)
Calcium: 10 mg/dL (ref 8.7–10.3)
Chloride: 98 mmol/L (ref 96–106)
Creatinine, Ser: 0.83 mg/dL (ref 0.57–1.00)
Globulin, Total: 2.4 g/dL (ref 1.5–4.5)
Glucose: 90 mg/dL (ref 70–99)
Potassium: 4.3 mmol/L (ref 3.5–5.2)
Sodium: 136 mmol/L (ref 134–144)
Total Protein: 7 g/dL (ref 6.0–8.5)
eGFR: 74 mL/min/{1.73_m2} (ref >59)

## 2023-10-16 LAB — MULTIPLE MYELOMA PANEL, SERUM
Albumin SerPl Elph-Mcnc: 3.9 g/dL (ref 2.9–4.4)
Albumin/Glob SerPl: 1.3 (ref 0.7–1.7)
Alpha 1: 0.2 g/dL (ref 0.0–0.4)
Alpha2 Glob SerPl Elph-Mcnc: 1 g/dL (ref 0.4–1.0)
B-Globulin SerPl Elph-Mcnc: 1.1 g/dL (ref 0.7–1.3)
Gamma Glob SerPl Elph-Mcnc: 0.8 g/dL (ref 0.4–1.8)
Globulin, Total: 3.1 g/dL (ref 2.2–3.9)
IgA/Immunoglobulin A, Serum: 77 mg/dL (ref 64–422)
IgG (Immunoglobin G), Serum: 742 mg/dL (ref 586–1602)
IgM (Immunoglobulin M), Srm: 107 mg/dL (ref 26–217)

## 2023-10-16 LAB — VITAMIN B12: Vitamin B-12: >2000 pg/mL — ABNORMAL HIGH (ref 232–1245)

## 2023-10-16 LAB — SEDIMENTATION RATE: Sed Rate: 4 mm/h (ref 0–40)

## 2023-10-16 LAB — SJOGREN'S SYNDROME ANTIBODS(SSA + SSB)
ENA SSA (RO) Ab: 0.4 AI (ref 0.0–0.9)
ENA SSB (LA) Ab: <0.2 AI (ref 0.0–0.9)

## 2023-10-16 LAB — METHYLMALONIC ACID, SERUM: Methylmalonic Acid: 121 nmol/L (ref 0–378)

## 2023-10-16 LAB — TSH: TSH: 0.165 u[IU]/mL — ABNORMAL LOW (ref 0.450–4.500)

## 2023-10-16 LAB — HOMOCYSTEINE: Homocysteine: 6.3 umol/L (ref 0.0–19.2)

## 2023-10-16 NOTE — Telephone Encounter (Signed)
Called pt and informed her per Dr Dohmeier "Normal protein e phoresis, no finding of a  mono or oligoclonal  AB population. NORMAL". Pt verbalized understanding. Pt had no questions at this time but was encouraged to call back if questions arise.

## 2023-10-16 NOTE — Telephone Encounter (Signed)
-----   Message from [place] Dohmeier sent at 10/16/2023 12:56 PM EST ----- Normal protein e phoresis, no finding of a  mono or oligoclonal  AB population. NORMAL

## 2023-10-19 NOTE — Procedures (Signed)
[facility] POLYSOMNOGRAPHY  INTERPRETATION REPORT   STUDY DATE:  10/15/2023     PATIENT NAME:  Grace Barry         DATE OF BIRTH:  Mar 25, 1951  PATIENT ID:  [id number]    TYPE OF STUDY:  PSG  READING PHYSICIAN: Melvyn Novas, MD REFERRED BY: Loni Dolly / CC PCP and cardiologist Dr Glynda Jaeger TECHNICIAN: Margaretann Loveless, RPSGT   HISTORY: 10-04-2023:  This 72 year-old Female Chronic Insomnia patient was seen in a sleep consult again upon recommendation of her cardiologist, the patient was here at [facility] followed for migraine by general neurology ( Dr Anne Hahn NP Christia Reading ) . Chronic insomnia, hard time falling asleep. And waking up tired, all day fatigued and excessively daytime sleepy. Apple watch shows early REM onset, within 30 minutes of falling asleep. Moderately fragmented, she sleeps 8-10 hours ( per apple watch) on oxygen- and is not refreshed. She reports possible dream enactment. Has Tremors- and appears worried.  She remains O2 dependent at home on 2 liters /m. she had been diagnosed years before our first encounter with mild apnea, she used a dental dervice ( DrFuller). A sleep study by HST in 07-2020 had been requested by Dr. Anne Hahn and performed with and without the dental device in place (changed at midpoint) and showed a higher RDI after the dental device was applied than without.  Mild AHI - when CMS rules were applied, less than 7/h. she slept 8 hours. An in lab study followed at the time and showed no apnea, but hypoxia- and she was already on oxygen.  She uses an apple watch now which indicated early REM sleep onset most nights, and still showed hypoxia while oxygen is used. Question for me is to check : If apnea is present now, or not.  If  REM BD is present, or not.   / ADDITIONAL INFORMATION:  The Epworth Sleepiness Scale endorsed at 12 out of 24 points and her fatigue severity is very high at 60 out of 63 points.   Height: 63 in  Weight: 168 lb (BMI 29) Neck Size: 16 in  MEDICATIONS: Aspirin, Astelin, Lioresal, Calcium, Vitamin D3, Vitamin B 12, Cymbalta, Pepcid, Kerendia, Flonase, Synthroid, Imodium, Cozaar, Magnesium, Glucophage, Toprol - XL, Singulair, Multivitamin, Pamelor, Fish Oil, Prilosec, Crestor, Shingrix, Mounjaro, Tumeric   TECHNICAL DESCRIPTION: A registered sleep technologist ( RPSGT)  was in attendance for the duration of the recording.  Data collection, scoring, video monitoring, and reporting were performed in compliance with the AASM Manual for the Scoring of Sleep and Associated Events;  SLEEP CONTINUITY AND SLEEP ARCHITECTURE:  Lights-out was at 22:08: and lights-on at  05:35:, with  7.5 hours (428m) of recording time . Total sleep time ( TST) was 274.5 minutes with a decreased sleep efficiency at 61.3%.  Sleep latency was increased at 72.0 minutes.  REM sleep latency was increased at 216.5 minutes. Of the total sleep time, the percentage of stage N1 sleep was 6.0%, stage N2 sleep was 91%, stage N3 sleep was 0.0%, and REM sleep was 3.3%.  There were 1 Stage R periods observed on this study night, 25 awakenings (i.e. transitions to Stage W from any sleep stage), and 74 total stage transitions. Wake after sleep onset (WASO) time accounted for 146m.  BODY POSITION:  TST was divided between the following sleep positions: 0.0% supine; 100%, non-supine 275 minutes (100%); right 140 minutes (51%), left 134 minutes (49%), and prone 00 minutes (0%).  RESPIRATORY MONITORING:   Based on CMS criteria (using a 4% oxygen desaturation rule for scoring hypopneas), there were 0 apneas (0 obstructive; 0 central; 0 mixed), and 2 hypopneas.  Apnea index was 0.0. Hypopnea index was 0.4.  The AHI= apnea-hypopnea index was 0.4/h overall (0.0 supine, 0 non-supine; 0.0 REM, 0.0 supine REM).  There were 0 respiratory effort-related arousals (RERAs).    Based on AASM criteria (using a 3% oxygen desaturation and /or arousal rule for  scoring hypopneas), there were 0 apneas (0 obstructive; 0 central; 0 mixed), and 2 hypopneas. Apnea index was 0.0. Hypopnea index was 0.4. The apnea-hypopnea index was 0.4 overall (0.0 supine, 0 non-supine; 0.0 REM, 0.0 supine REM).  There were 0 respiratory effort-related arousals (RERAs).   OXIMETRY: Oxyhemoglobin Saturation Nadir during sleep was at  86% while on 02-  from a mean of 96%.  Of the Total sleep time (TST)  hypoxemia (=<88%) was present for  0.5 minutes, or 0.2% of total sleep time.  LIMB MOVEMENTS: There were 0 periodic limb movements of sleep (0.0/hr), of which 0 (0.0/hr) were associated with an arousal. AROUSAL: There were 49 total arousals, all were non-specific arousals (11 /h). EEG:  PSG EEG was of normal amplitude and frequency, with symmetric manifestation of sleep stages. EKG: The electrocardiogram documented NSR.  The average heart rate during sleep was 84 bpm.  The heart rate during sleep varied between a minimum of  79 bpm and  a maximum of  98 bpm. AUDIO and VIDEO: no complex movements, no vocalization.   IMPRESSION: 1) Sleep disordered breathing was not present.  Oxygen saturation was well maintained on 2 l /m of oxygen. Oxygen use is certified and ordered through cardiologist/ pulmonologist.    2) Periodic limb movements were not present. No sign of REM sleep BD- the recorded brief REM period was not associated with activity.  3) Sleep efficiency : Total sleep time was reduced at 274.5 minutes.  Sleep efficiency was decreased at 61.3%.  All sleep arousals were unrelated to physiological functions monitored by a PSG.   RECOMMENDATIONS: no physiologic sleep disorder, no organic sleep dysfunction.  There needs to be no follow up with the sleep clinic . Chronic insomnia is best addressed with cognitive behavior therapy. PCP to consider rheumatology referral for ill defined pain syndrome.   Melvyn Novas, MD [facility] Board  certified by The [employer] of Sleep Medicine and Diplomate of the [employer] of Sleep Medicine. Board certified In Neurology through the ABPN, Fellow of the [employer] of Neurology.             Medical & Medication History    Grace Barry is a 72 y.o. female patient who is seen upon referral on 10/04/2023 from [facility] Margie Ege, NP , for a new evaluation of hypersomnia. Chief concern according to patient: chronic insomnia, hard time falling asleep. And waking up tired, all day fatigued and excessively daytime sleepy. Apple watch shows early REM onset, within 30 minutes of falling asleep. Moderately fragmented, she sleeps 8-10 hours and is not refreshed. She underwent a Sleep study here at [facility] Mrs. Sherwin presented in October 2021 with a history of respiratory allergies, history of anxiety, arthritis asthma cataract stage II mild kidney disease, GERD, hyperlipidemia hypertension and irritable bowel syndrome. She also has a history of migraine for which she is still followed here at [facility] by Margie Ege, a history of type 2 diabetes mellitus. She had a  ventral hernia, and she had spells of vertigo which were noted in April 2016 in her medical record. She had mild apnea diagnosed in the past and was using a dental device provided by Dr. Irene Limbo but it did not change her sleepiness and daytime. So the study was performed while she uses a dental device approximately half of the night and she changed at 3:10 AM there was actually more apnea and hypoxia present after the patient began using a dental device at night. Thiscould be an indicator that she entered REM sleep only after the first 2 hours of sleep-   and is in contrast to what her current Apple Watch recording sats. Only mild snoring and her persistent hypoxemia which has been supplemented by oxygen.  So , at this time her depression score is very low 2 out of 15 points which was different 4 years ago. Her Epworth sleepiness  scale remains high at 12 out of 24 points and her fatigue severity is very high at 60 out of 63 points.  I am not sure if we have a primary sleep disorder here and my main goal is to reevaluate and see if apnea may have developed in the meantime.  Cardiologist Dr. Bjorn Pippin, , MD recommended a repeat sleep study.  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 I have the pleasure of seeing Grace Barry again on 10/04/23 , a right-handed female with a possible sleep disorder. She is now 72 years-old. Her next sleep study will be scored by CMS criteria and performed on oxygen .  Aspirin, Astelin, Lioresal, Calcium, Vitamin D3, Vitamin B 12, Cymbalta, Pepcid, Kerendia, Flonase, Synthroid, Imodium, Cozaar, Magnesium, Glucophage, Toprol - XL, Singulair, Multivitamin, Pamelor, Fish Oil, Prilosec, Crestor, Shingrix, Mounjaro, Tumeric   Sleep Disorder      Comments   The patient came into the lab for a PSG. The patient had a prior HST on 08/29/20 and a PSG on 09/27/20 both studies were completed at our sleep lab. The sleep study was preformed while the patient was on 2/lpm of oxygen per MD's order. The patient is on 2 Liters of oxygen at home. Per the patient she is no longer using the oral appliance. The patient had one restroom break. EKG showed no obvious cardiac arrhythmias. Mild snoring. Respiratory events scored with a 4% desat. Slept mostly lateral. AHI was 0.9 after 2 hrs of TST. The patient had delayed sleep onset. Also, there was a delay in REM onset. No REM behavior disorder observed however, the patient had very little REM sleep. Fragmented sleep.     Lights out: 10:08:15 PM Lights on: 05:35:36 AM   Time Total Supine Side Prone Upright  Recording (TRT) 7h 27.20m 0h 1.50m 7h 26.47m 0h 0.31m 0h 0.48m  Sleep (TST) 4h 34.57m 0h 0.19m 4h 34.65m 0h 0.19m 0h 0.71m   Latency N1 N2 N3 REM Onset Per. Slp. Eff.  Actual 1h 12.67m 1h 27.32m 0h 0.45m 3h 36.47m 1h 12.38m 1h 44.67m 61.34%   Stg Dur  Wake N1 N2 N3 REM  Total 99.0 16.5 249.0 0.0 9.0  Supine 0.5 0.0 0.0 0.0 0.0  Side 98.5 16.5 249.0 0.0 9.0  Prone 0.0 0.0 0.0 0.0 0.0  Upright 0.0 0.0 0.0 0.0 0.0   Stg % Wake N1 N2 N3 REM  Total 26.5 6.0 90.7 0.0 3.3  Supine 0.1 0.0 0.0 0.0 0.0  Side 26.4 6.0 90.7 0.0 3.3  Prone 0.0 0.0 0.0 0.0 0.0  Upright 0.0  0.0 0.0 0.0 0.0     Apnea Summary Sub Supine Side Prone Upright  Total 0 Total 0 0 0 0 0    REM 0 0 0 0 0    NREM 0 0 0 0 0  Obs 0 REM 0 0 0 0 0    NREM 0 0 0 0 0  Mix 0 REM 0 0 0 0 0    NREM 0 0 0 0 0  Cen 0 REM 0 0 0 0 0    NREM 0 0 0 0 0   Rera Summary Sub Supine Side Prone Upright  Total 0 Total 0 0 0 0 0    REM 0 0 0 0 0    NREM 0 0 0 0 0   Hypopnea Summary Sub Supine Side Prone Upright  Total 2 Total 2 0 2 0 0    REM 0 0 0 0 0    NREM 2 0 2 0 0   4% Hypopnea Summary Sub Supine Side Prone Upright  Total (4%) 2 Total 2 0 2 0 0    REM 0 0 0 0 0    NREM 2 0 2 0 0     AHI Total Obs Mix Cen  0.44 Apnea 0.00 0.00 0.00 0.00   Hypopnea 0.44 -- -- --  0.44 Hypopnea (4%) 0.44 -- -- --    Total Supine Side Prone Upright  Position AHI 0.44 0.00 0.44 0.00 0.00  REM AHI 0.00   NREM AHI 0.45   Position RDI 0.44 0.00 0.44 0.00 0.00  REM RDI 0.00   NREM RDI 0.45    4% Hypopnea Total Supine Side Prone Upright  Position AHI (4%) 0.44 0.00 0.44 0.00 0.00  REM AHI (4%) 0.00   NREM AHI (4%) 0.45   Position RDI (4%) 0.44 0.00 0.44 0.00 0.00  REM RDI (4%) 0.00   NREM RDI (4%) 0.45    Desaturation Information Threshold: 2% <100% <90% <80% <70% <60% <50% <40%  Supine 1.0 0.0 0.0 0.0 0.0 0.0 0.0  Side 35.0 0.0 0.0 0.0 0.0 0.0 0.0  Prone 0.0 0.0 0.0 0.0 0.0 0.0 0.0  Upright 0.0 0.0 0.0 0.0 0.0 0.0 0.0  Total 36.0 0.0 0.0 0.0 0.0 0.0 0.0  Index 5.8 0.0 0.0 0.0 0.0 0.0 0.0   Threshold: 3% <100% <90% <80% <70% <60% <50% <40%  Supine 0.0 0.0 0.0 0.0 0.0 0.0 0.0  Side 9.0 0.0 0.0 0.0 0.0 0.0 0.0  Prone 0.0 0.0 0.0 0.0 0.0 0.0 0.0  Upright 0.0 0.0 0.0 0.0 0.0 0.0  0.0  Total 9.0 0.0 0.0 0.0 0.0 0.0 0.0  Index 1.4 0.0 0.0 0.0 0.0 0.0 0.0   Threshold: 4% <100% <90% <80% <70% <60% <50% <40%  Supine 0.0 0.0 0.0 0.0 0.0 0.0 0.0  Side 5.0 0.0 0.0 0.0 0.0 0.0 0.0  Prone 0.0 0.0 0.0 0.0 0.0 0.0 0.0  Upright 0.0 0.0 0.0 0.0 0.0 0.0 0.0  Total 5.0 0.0 0.0 0.0 0.0 0.0 0.0  Index 0.8 0.0 0.0 0.0 0.0 0.0 0.0   Threshold: 4% <100% <90% <80% <70% <60% <50% <40%  Supine 0 0 0 0 0 0 0  Side 5 0 0 0 0 0 0  Prone 0 0 0 0 0 0 0  Upright 0 0 0 0 0 0 0  Total 5 0 0 0 0 0 0   Awakening/Arousal Information # of Awakenings 25  Wake after sleep onset 101.23m  Wake after persistent sleep 79.47m   Arousal Assoc. Arousals Index  Apneas 0 0.0  Hypopneas 0 0.0  Leg Movements 1 0.2  Snore 0 0.0  PTT Arousals 0 0.0  Spontaneous 49 10.7  Total 50 10.9  Leg Movement Information PLMS LMs Index  Total LMs during PLMS 0 0.0  LMs w/ Microarousals 0 0.0   LM LMs Index  w/ Microarousal 1 0.2  w/ Awakening 1 0.2  w/ Resp Event 0 0.0  Spontaneous 0 0.0  Total 1 0.2     Desaturation threshold setting: 4% Minimum desaturation setting: 10 seconds SaO2 nadir: 86% The longest event was a 32 sec obstructive Hypopnea with a minimum SaO2 of 94%. The lowest SaO2 was 92% associated with a 14 sec obstructive Hypopnea. EKG Rates EKG Avg Max Min  Awake 86 119 68  Asleep 84 98

## 2023-10-22 ENCOUNTER — Telehealth: Payer: Self-pay

## 2023-10-22 NOTE — Telephone Encounter (Signed)
-----   Message from [place] Dohmeier sent at 10/19/2023  2:17 PM EST ----- Fragmented sleep, prolonged sleep latency and very brief  REM sleep periods. No apnea, no PLMs, no EMG activity in REM sleep, normal EEG , EKG. Patient was on 2 L/m of oxygen during this study.   An organic sleep disorder was not documented. No follow up in sleep medicine needed.

## 2023-10-22 NOTE — Telephone Encounter (Signed)
I spoke with the patient and provided her the results of the test. She verbalized understanding of the findings and expressed appreciation for the call. She denied any questions at this time.

## 2023-11-05 ENCOUNTER — Encounter: Payer: Self-pay | Admitting: Internal Medicine

## 2023-11-05 NOTE — Patient Instructions (Signed)

## 2023-11-05 NOTE — Progress Notes (Signed)
 Grace Grace      ADULT   &   ADOLESCENT      INTERNAL MEDICINE  Grace Grace, M.D.          Grace Grace, ANP        Grace Cranford, FNP  [facility] [address]  [place], [place]. [address] Telephone [phone] Telefax [phone]  Future Appointments  Date Time Provider Department  11/06/2023               6 mo   ov                 3:30 PM Grace Elsie, MD [facility]  01/02/2024 12:45 PM Grace Grace PARAS, NP [facility]  02/05/2024               wellness  2:30 PM Grace President, NP [facility]  05/06/2024  1:45 PM Grace Grace PARAS, NP [facility]  07/17/2024                 cpe  2:00 PM Wilkinson, Dana E, NP [facility]    History of Present Illness:      This very nice 73 y.o. MWF  presents for 6  month follow up with HTN, HLD, T2_NIDDM  and Vitamin D  Deficiency.  CT scan in past revealed Aortic Atherosclerosis.        Patient relates that she had recent evaluation by Dr Dohmeier to r/o OSA and was dx'd with Hypersomnia. Patient also relates problems with poor sleep latenct difficulty falling asleep & staying asleep.        Patient is treated for HTN (2009) & BP has been controlled at home. Today's BP is at goal - 118/70 . Patient has had no complaints of any cardiac type chest pain, palpitations, dyspnea chet /PND, dizziness, claudication or dependent edema.       Hyperlipidemia is controlled with diet & meds. Patient denies myalgias or other med SE's. Last Lipids were at goal :  Lab Results  Component Value Date   CHOL 108 07/18/2023   HDL 43 (L) 07/18/2023   LDLCALC 42 07/18/2023   TRIG 147 07/18/2023   CHOL HDL 2.5 07/18/2023   Wt Readings from Last 3 Encounters:  11/06/23 169 lb 6.4 oz (76.8 kg)  10/04/23 168 lb (76.2 kg)  08/30/23 173 lb 9.6 oz (78.7 kg)    Also, the patient has history of T2_NIDDM on Metformin  & Mounjaro  and  and has had no symptoms of reactive hypoglycemia, diabetic polys, paresthesias or visual blurring.   Last A1c was near goal :  Lab Results  Component Value Date   HGBA1C 5.8 (H) 07/18/2023         Patient has been on Thyroid  Replacement since 2008 when dx'd Hypothyroid. Last TSH 2.51 Normal on 03/07/2023.  Patient is only taking her levothyroxine  150 mcg every 3 days  ( last dose yesterday) .                                                         Further, the patient also has history of Vitamin D  Deficiency and supplements vitamin D  . Last vitamin D  was sat goal :  Lab Results  Component Value Date   VD25OH 91 07/18/2023       Current Outpatient Medications  Medication  Instructions   Aspirin  81 mg  Daily   azelastine  (0.1 % nasal spray 1 spray, Each Nare, 2 times daily   baclofen  10 MG tablet TAKE 1 TABLET TWICE A DAY AS NEEDED FOR MUSCLE SPASMS   CALCIUM   1,200 mg,  Daily   VITAMIN D  5000 u Take  1 capsule Daily   VITAMIN B12 SL 500 mcg, Sublingual, Daily   DULoxetine  60 MG capsule TAKE 1 CAPSULE DAILY    EPI-PEN 0.3 mg, Intramuscular,   Once   famotidine  40 MG tablet Take  1 tablet    2 x / day    FLONASE   nasal spray 1-2 sprays, Each Nare Daily at bedtime   Kerendia  10 mg Daily   levothyroxine  125 mcg Daily   loperamide  2 mg As needed   losartan  100 MG tablet TAKE 1 TABLET DAILY    Magnesium 50 mg 2 times daily   metFORMIN -XR 500 MG  TAKE 2 TABLETS TWICE DAILY    metoprolol  succinate -XL  50 mg Daily   montelukast  10 MG tablet TAKE 1 TABLET  AT BEDTIME   Multiple Vitamins-Minerals 1 table  Daily   nortriptyline   10 MG capsule TAKE 1 CAPSULE AT BEDTIME   Omega-3 FISH OIL 600 mg, Oral,  Daily   omeprazole   20 MG capsule TAKE 1 CAPSULE DAILY     Allergy Shot  Injection, Every 30 days,   rosuvastatin   10 MG tablet TAKE 1 TABLET EVERY DAY    MOUNJARO  15 MG/0.5ML Pen Inject 15 mg into the skin every 7 days    Turmeric  1 tablet  Daily     Allergies  Allergen Reactions   Floxin [Ofloxacin] Hives   Invokana  [Canagliflozin ]     Hematuria- avoid class   Viberzi   [Eluxadoline ] Hives   Doxycycline  Rash   Penicillins Rash     PMHx:   Past Medical History:  Diagnosis Date   Allergy    Anxiety    Arthritis    Asthma    Cataract    Chronic kidney disease (CKD), stage II (mild) 07/01/2014   Overview:  Overview:  Due to DM, last GFR 64    Degenerative tear of triangular fibrocartilage complex (TFCC) of left wrist 01/17/2019   Depression    Fibromyalgia    GERD (gastroesophageal reflux disease)    Hyperlipidemia    Hypertension    Hypoxemia    IBS (irritable bowel syndrome)    Liver disease    Migraine 02/19/2015   Migraines    Obesity    Osteoporosis    Sensorineural hearing loss (SNHL) of left ear with unrestricted hearing of right ear 12/31/2017   Sleep apnea    uses oral device   Sleep difficulties    Thyroid  disease    hypothyroidism   Tuberculosis    positive PPD test, chest x ray negative   Type II type diabetes mellitus    Urticaria    Ventral hernia    Vertigo 02/19/2015     Immunization History  Administered Date(s) Administered   Influenza, High Dose  11/07/2018, 08/07/2019, 09/21/2020, 07/25/2021   Influenza 08/05/2019   PFIZER-SARS-COV-2 Vacc 11/19/2019, 12/10/2019, 08/05/2020   Pneumococcal -13 07/01/2014   Pneumococcal -23 10/09/2002, 05/26/2008, 12/13/2016   Td 10/09/2002, 10/30/2010   Tdap 08/10/2015   Zoster Recombinat (Shingrix) 07/25/2021   Zoster, Live 06/20/2011     Past Surgical History:  Procedure Laterality Date   ABDOMINAL HYSTERECTOMY     ADENOIDECTOMY  CARPAL TUNNEL RELEASE     bilateral   CATARACT EXTRACTION, BILATERAL     CESAREAN SECTION     x2   CHOLECYSTECTOMY     COLONOSCOPY     HERNIA REPAIR  07/24/11   ventral hernia   JOINT REPLACEMENT     bilateral knees   KNEE ARTHROSCOPY     bilateral   SINOSCOPY     TONSILLECTOMY     TOTAL KNEE ARTHROPLASTY     bilateral    FHx:    Reviewed / unchanged  SHx:    Reviewed / unchanged   Systems Review:  Constitutional: Denies  fever, chills, wt changes, headaches, insomnia, fatigue, night sweats, change in appetite. Eyes: Denies redness, blurred vision, diplopia, discharge, itchy, watery eyes.  ENT: Denies discharge, congestion, post nasal drip, epistaxis, sore throat, earache, hearing loss, dental pain, tinnitus, vertigo, sinus pain, snoring.  CV: Denies chest pain, palpitations, irregular heartbeat, syncope, dyspnea, diaphoresis, orthopnea, PND, claudication or edema. Respiratory: denies cough, dyspnea, DOE, pleurisy, hoarseness, laryngitis, wheezing.  Gastrointestinal: Denies dysphagia, odynophagia, heartburn, reflux, water brash, abdominal pain or cramps, nausea, vomiting, bloating, diarrhea, constipation, hematemesis, melena, hematochezia  or hemorrhoids. Genitourinary: Denies dysuria, frequency, urgency, nocturia, hesitancy, discharge, hematuria or flank pain. Musculoskeletal: Denies arthralgias, myalgias, stiffness, jt. swelling, pain, limping or strain/sprain.  Skin: Denies pruritus, rash, hives, warts, acne, eczema or change in skin lesion(s). Neuro: No weakness, tremor, incoordination, spasms, paresthesia or pain. Psychiatric: Denies confusion, memory loss or sensory loss. Endo: Denies change in weight, skin or hair change.  Heme/Lymph: No excessive bleeding, bruising or enlarged lymph nodes.  Physical Exam  BP 118/70   Pulse 85   Temp 97.9 F (36.6 C)   Resp 17   Ht 5' 3 (1.6 m)   Wt 169 lb 6.4 oz (76.8 kg)   SpO2 97%   BMI 30.01 kg/m   Appears  well nourished, well groomed  and in no distress.  Eyes: PERRLA, EOMs, conjunctiva no swelling or erythema. Sinuses: No frontal/maxillary tenderness ENT/Mouth: EAC's clear, TM's nl w/o erythema, bulging. Nares clear w/o erythema, swelling, exudates. Oropharynx clear without erythema or exudates. Oral hygiene is good. Tongue normal, non obstructing. Hearing intact.  Neck: Supple. Thyroid  not palpable. Car 2+/2+ without bruits, nodes or JVD. Chest:  Respirations nl with BS clear & equal w/o rales, rhonchi, wheezing or stridor.  Cor: Heart sounds normal w/ regular rate and rhythm without sig. murmurs, gallops, clicks or rubs. Peripheral pulses normal and equal  without edema.  Abdomen: Soft & bowel sounds normal. Non-tender w/o guarding, rebound, hernias, masses or organomegaly.  Lymphatics: Unremarkable.  Musculoskeletal: Full ROM all peripheral extremities, joint stability, 5/5 strength and normal gait.  Skin: Warm, dry without exposed rashes, lesions or ecchymosis apparent.  Neuro: Cranial nerves intact, reflexes equal bilaterally. Sensory-motor testing grossly intact. Tendon reflexes grossly intact.  Pysch: Alert & oriented x 3.  Insight and judgement nl & appropriate. No ideations.  Assessment and Plan:  1. Essential hypertension  - Continue medication, monitor blood pressure at home.  - Continue DASH diet.  Reminder to go to the ER if any CP,  SOB, nausea, dizziness, severe HA, changes vision/speech.   - CBC with Differential/Platelet - COMPLETE METABOLIC PANEL WITH GFR - Magnesium - TSH   2. Hyperlipidemia associated with type 2 diabetes mellitus (HCC)  - Continue diet/meds, exercise,& lifestyle modifications.  - Continue monitor periodic cholesterol/liver & renal functions   - Lipid panel - TSH   3. Type 2 diabetes  mellitus with stage 2 chronic kidney  disease, without long-term current use of insulin  (HCC)  - Continue diet, exercise  - Lifestyle modifications.  - Monitor appropriate labs    - Hemoglobin A1c - Insulin , random - tirzepatide  (MOUNJARO ) 10 MG/0.5ML Pen;  Inject 1 pen (10 mg) every 7 days for Diabetes  ( Dx:  e11.29 )    4. Vitamin D  deficiency  - Continue supplementation   - VITAMIN D  25 Hydroxy   5. Hypothyroidism - TSH   6. Aortic atherosclerosis (HCC) by Abd  CT scan in 2020  - Lipid panel   7. Medication management  - CBC with Differential/Platelet - COMPLETE METABOLIC  PANEL WITH GFR - Magnesium - Lipid panel - TSH - Hemoglobin A1c - Insulin , random - VITAMIN D  25 Hydrox  8. Hypersomnia & Insomnia.          Discussed  regular exercise, BP monitoring, weight control to achieve/maintain BMI less than 25 and discussed med and SE's. Recommended labs to assess /monitor clinical status .  I discussed the assessment and treatment plan with the patient. The patient was provided an opportunity to ask questions and all were answered. The patient agreed with the plan and demonstrated an understanding of the instructions.  I provided over 30 minutes of exam, counseling, chart review and  complex critical decision making.        The patient was advised to call back or seek an in-person evaluation if the symptoms worsen or if the condition fails to improve as anticipated.   Grace JONETTA Richards, MD

## 2023-11-06 ENCOUNTER — Ambulatory Visit (INDEPENDENT_AMBULATORY_CARE_PROVIDER_SITE_OTHER): Payer: 59 | Admitting: Internal Medicine

## 2023-11-06 VITALS — BP 118/70 | HR 85 | Temp 97.9°F | Resp 17 | Ht 63.0 in | Wt 169.4 lb

## 2023-11-06 DIAGNOSIS — Z79899 Other long term (current) drug therapy: Secondary | ICD-10-CM

## 2023-11-06 DIAGNOSIS — N182 Chronic kidney disease, stage 2 (mild): Secondary | ICD-10-CM

## 2023-11-06 DIAGNOSIS — E1122 Type 2 diabetes mellitus with diabetic chronic kidney disease: Secondary | ICD-10-CM | POA: Diagnosis not present

## 2023-11-06 DIAGNOSIS — R809 Proteinuria, unspecified: Secondary | ICD-10-CM

## 2023-11-06 DIAGNOSIS — G4710 Hypersomnia, unspecified: Secondary | ICD-10-CM

## 2023-11-06 DIAGNOSIS — I10 Essential (primary) hypertension: Secondary | ICD-10-CM | POA: Diagnosis not present

## 2023-11-06 DIAGNOSIS — E559 Vitamin D deficiency, unspecified: Secondary | ICD-10-CM | POA: Diagnosis not present

## 2023-11-06 DIAGNOSIS — E1169 Type 2 diabetes mellitus with other specified complication: Secondary | ICD-10-CM | POA: Diagnosis not present

## 2023-11-06 DIAGNOSIS — E785 Hyperlipidemia, unspecified: Secondary | ICD-10-CM

## 2023-11-06 DIAGNOSIS — I700 Atherosclerosis of aorta: Secondary | ICD-10-CM

## 2023-11-06 DIAGNOSIS — E039 Hypothyroidism, unspecified: Secondary | ICD-10-CM

## 2023-11-06 DIAGNOSIS — F5101 Primary insomnia: Secondary | ICD-10-CM

## 2023-11-06 MED ORDER — MODAFINIL 200 MG PO TABS: ORAL_TABLET | ORAL | 0 refills | Status: DC

## 2023-11-06 MED ORDER — GABAPENTIN 100 MG PO CAPS: ORAL_CAPSULE | ORAL | 2 refills | Status: DC

## 2023-11-07 ENCOUNTER — Other Ambulatory Visit: Payer: Self-pay | Admitting: Internal Medicine

## 2023-11-07 DIAGNOSIS — E039 Hypothyroidism, unspecified: Secondary | ICD-10-CM

## 2023-11-07 LAB — COMPLETE METABOLIC PANEL WITH GFR
AG Ratio: 2 (calc) (ref 1.0–2.5)
ALT: 16 U/L (ref 6–29)
AST: 17 U/L (ref 10–35)
Albumin: 4.3 g/dL (ref 3.6–5.1)
Alkaline phosphatase (APISO): 75 U/L (ref 37–153)
BUN: 8 mg/dL (ref 7–25)
CO2: 27 mmol/L (ref 20–32)
Calcium: 9.6 mg/dL (ref 8.6–10.4)
Chloride: 97 mmol/L — ABNORMAL LOW (ref 98–110)
Creat: 0.79 mg/dL (ref 0.60–1.00)
Globulin: 2.2 g/dL (ref 1.9–3.7)
Glucose, Bld: 101 mg/dL — ABNORMAL HIGH (ref 65–99)
Potassium: 4.1 mmol/L (ref 3.5–5.3)
Sodium: 133 mmol/L — ABNORMAL LOW (ref 135–146)
Total Bilirubin: 0.6 mg/dL (ref 0.2–1.2)
Total Protein: 6.5 g/dL (ref 6.1–8.1)
eGFR: 78 mL/min/{1.73_m2} (ref >=60)

## 2023-11-07 LAB — INSULIN, RANDOM: Insulin: 34.1 u[IU]/mL — ABNORMAL HIGH

## 2023-11-07 LAB — CBC WITH DIFFERENTIAL/PLATELET
Absolute Lymphocytes: 3941 {cells}/uL — ABNORMAL HIGH (ref 850–3900)
Absolute Monocytes: 634 {cells}/uL (ref 200–950)
Basophils Absolute: 85 {cells}/uL (ref 0–200)
Basophils Relative: 0.7 %
Eosinophils Absolute: 317 {cells}/uL (ref 15–500)
Eosinophils Relative: 2.6 %
HCT: 41.3 % (ref 35.0–45.0)
Hemoglobin: 13.6 g/dL (ref 11.7–15.5)
MCH: 28.2 pg (ref 27.0–33.0)
MCHC: 32.9 g/dL (ref 32.0–36.0)
MCV: 85.5 fL (ref 80.0–100.0)
MPV: 10.9 fL (ref 7.5–12.5)
Monocytes Relative: 5.2 %
Neutro Abs: 7222 {cells}/uL (ref 1500–7800)
Neutrophils Relative %: 59.2 %
Platelets: 393 10*3/uL (ref 140–400)
RBC: 4.83 10*6/uL (ref 3.80–5.10)
RDW: 13.4 % (ref 11.0–15.0)
Total Lymphocyte: 32.3 %
WBC: 12.2 10*3/uL — ABNORMAL HIGH (ref 3.8–10.8)

## 2023-11-07 LAB — MICROALBUMIN / CREATININE URINE RATIO
Creatinine, Urine: 37 mg/dL (ref 20–275)
Microalb Creat Ratio: 35 mg/g{creat} — ABNORMAL HIGH (ref <30)
Microalb, Ur: 1.3 mg/dL

## 2023-11-07 LAB — LIPID PANEL
Cholesterol: 108 mg/dL (ref <200)
HDL: 45 mg/dL — ABNORMAL LOW (ref >=50)
LDL Cholesterol (Calc): 40 mg/dL
Non-HDL Cholesterol (Calc): 63 mg/dL (ref <130)
Total CHOL/HDL Ratio: 2.4 (calc) (ref <5.0)
Triglycerides: 152 mg/dL — ABNORMAL HIGH (ref <150)

## 2023-11-07 LAB — HEMOGLOBIN A1C
Hgb A1c MFr Bld: 5.6 %{Hb} (ref <5.7)
Mean Plasma Glucose: 114 mg/dL
eAG (mmol/L): 6.3 mmol/L

## 2023-11-07 LAB — TSH: TSH: 0.15 m[IU]/L — ABNORMAL LOW (ref 0.40–4.50)

## 2023-11-07 LAB — MAGNESIUM: Magnesium: 1.7 mg/dL (ref 1.5–2.5)

## 2023-11-07 LAB — VITAMIN D 25 HYDROXY (VIT D DEFICIENCY, FRACTURES): Vit D, 25-Hydroxy: 89 ng/mL (ref 30–100)

## 2023-11-07 NOTE — Progress Notes (Signed)
 Correction   You are Diabetic , but A1c are ibn the Normal Non-Diabetic Range ,   & since kidney functions are essentially normal for age -                                                                              Please stop the [place]

## 2023-11-07 NOTE — Progress Notes (Signed)
============================================================= -  Test results slightly outside the reference range are not unusual. If there is anything important, I will review this with you,  otherwise it is considered normal test values.  If you have further questions,  please do not hesitate to contact me at the office or via My Chart.  =============================================================  -  Chol = 108 & LDL = 40 - Both  Excellent   - Very low risk for Heart Attack  / Stroke  =============================================================  -  TSH level is very low  which means Thyroid  hormone level in blood is too high,  - So stop your thyroid  ( 125 mcg)  for 5 days & then restart back &              only take 1/2 tablet  / day   - Please call office to schedule a Nurse Visit in 1 month to                                                                          Recheck your Thyroid  (TSH) level  =============================================================  -   Magnesium = 1.7 is   very  low- goal is betw 2.0 - 2.5, Please   - So..............SABRA  Recommend that you take                                                  Magnesium 500 mg tablet  3 x/day with meals   - also important to eat lots of  leafy green vegetables   - spinach - Kale - collards - greens - okra - asparagus  - broccoli - quinoa - squash - almonds   - black, red, white beans  -  peas - green beans  =============================================================  -  Vitamin D  = 89 - Excellent - Please keep dosage same   =============================================================  -  A1c is Normal  - You are NOT Diabetic or Pre-Diabetic                                                                           - Please STOP the Kerendia     =============================================================  -  All Else - CBC - Kidneys - Electrolytes - Liver - Magnesium & Thyroid     - all  Normal  / OK  =============================================================

## 2023-11-12 ENCOUNTER — Other Ambulatory Visit: Payer: Self-pay | Admitting: Internal Medicine

## 2023-11-12 ENCOUNTER — Ambulatory Visit (INDEPENDENT_AMBULATORY_CARE_PROVIDER_SITE_OTHER): Payer: Self-pay

## 2023-11-12 ENCOUNTER — Other Ambulatory Visit (HOSPITAL_COMMUNITY): Payer: Self-pay

## 2023-11-12 ENCOUNTER — Encounter: Payer: Self-pay | Admitting: Internal Medicine

## 2023-11-12 DIAGNOSIS — F5101 Primary insomnia: Secondary | ICD-10-CM

## 2023-11-12 DIAGNOSIS — J309 Allergic rhinitis, unspecified: Secondary | ICD-10-CM | POA: Diagnosis not present

## 2023-11-12 MED ORDER — TRAZODONE HCL 300 MG PO TABS: ORAL_TABLET | ORAL | 0 refills | Status: DC

## 2023-12-06 ENCOUNTER — Ambulatory Visit: Payer: Medicare HMO | Admitting: Neurology

## 2023-12-18 ENCOUNTER — Ambulatory Visit (INDEPENDENT_AMBULATORY_CARE_PROVIDER_SITE_OTHER): Payer: Self-pay | Admitting: *Deleted

## 2023-12-18 ENCOUNTER — Ambulatory Visit: Payer: Medicare Other

## 2023-12-18 DIAGNOSIS — J309 Allergic rhinitis, unspecified: Secondary | ICD-10-CM | POA: Diagnosis not present

## 2023-12-18 DIAGNOSIS — K080 Exfoliation of teeth due to systemic causes: Secondary | ICD-10-CM | POA: Diagnosis not present

## 2023-12-28 ENCOUNTER — Ambulatory Visit: Payer: Medicare Other | Admitting: Nurse Practitioner

## 2023-12-29 DIAGNOSIS — E119 Type 2 diabetes mellitus without complications: Secondary | ICD-10-CM | POA: Diagnosis not present

## 2024-01-01 NOTE — Progress Notes (Unsigned)
 PATIENT: Grace Barry DOB: 1951/09/05  REASON FOR VISIT: follow up for migraines, tremor HISTORY FROM: patient Primary Neurologist: Dr. Vickey Huger   HISTORY OF PRESENT ILLNESS: Today 01/02/24  Has been doing great! Takes nortriptyline 10 mg at bedtime. Has not needed baclofen for headache!! Has had a few sinus headaches, around eye brow area. Tremor is essentially resolved. Her PCP passed away suddenly, she is establishing with a new primary care provider.   Update 11/23/22 SS: Doing very good, has only needed baclofen < 5 times this whole year. Remains on nortriptyline 10 mg at bedtime. Tremor in hands worse when nervous. No health issues. Going to [employer] next year.   11/17/21 SS: Grace Barry is here today for fol follow-up low-up with history of migraine headache and tremor. Since July headaches are much better, only 4. Doesn't think tizanidine works as well as baclofen to get rid of headaches. Still on nortriptyline 10 mg at bedtime, no side effects, working well. Stress is some better, her friend is now home. Tremor in hands only when super stressed. Still on oxygen at night, pulmonary. Just got back from [place], going to [place] in October.   05/12/2021 SS: Grace Barry is a 73 year old female with history of migraine headache and tremor.  Had sleep study in November 2021 using dental device, recommended to come for an in lab study.  While using the dental device longer oxygen desaturation time, low oxygen may be because of fatigue and headaches. Now sleeping with 2 L oxygen, seeing pulmonary, doesn't sleep well, up and down all night. Reports 3 weeks ago, headaches returned, tension type headache, like wearing a cap. Typical migraines in all over head, migraine features. Right now feels like tight ball camp. No sinus drainage or issues. 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 No neuro symptoms. Still search for words at times. MRI of the brain was  normal in Jan 2019. Taking baclofen as needed, helps pain but doesn't alleviate, makes drowsy. Still on Toprol-XL.   Update 07/28/2020 SS: Grace Barry is a 73 year old female with history of migraine headache and reported tremor.  She has come off Topamax, on her own, feels memory and cognition is clearer.  Headaches remain well controlled, on metoprolol. For headache, will take baclofen with good benefit.  Tremor in the hands, left greater than the right, intermittent, worse with anxiety, overall stable, doesn't interfere with daily activity.  Recently saw Dr. Vickey Huger for sleep consultation, pending sleep study, due to fatigue.  She wears a dental device.  Is overall doing well.  Presents today for follow-up unaccompanied.  HISTORY 07/28/2019 SS: Grace Barry is a 73 year old female with history of migraine headache and reported tremor.  She remains on Topamax 50 mg daily, and metoprolol XR 50 mg daily.  She indicates this provides excellent control of her migraine headaches.  She says she is having less than 1 headache a month.  If she gets a headache, it may be moderate intensity, but taking her baclofen relieves the headache.  She indicates that her memory decline seems to have improved.  After last visit, she tried to decrease her dose of Topamax due to report of memory decline, but her headaches increased. She is currently undergoing evaluation by cardiology for EKG changes.  She is planning to have a cardiac stress test tomorrow.  She indicates she has been suffering from significant fatigue.  She does report tremor in her  hands, left is worse than the right.  This does not impact her daily activity or handwriting.  She notices it worse during times of anxiety or stress.  She says she has had a few stumbles, but no falls. She presents today for follow-up via virtual visit.    REVIEW OF SYSTEMS: Out of a complete 14 system review of symptoms, the patient complains only of the following symptoms, and all  other reviewed systems are negative.  Headache   ALLERGIES: Allergies  Allergen Reactions   Floxin [Ofloxacin] Hives   Invokana [Canagliflozin]     Hematuria- avoid class   Viberzi [Eluxadoline] Hives    HOME MEDICATIONS: Outpatient Medications Prior to Visit  Medication Sig Dispense Refill   aspirin 81 MG tablet Take 81 mg by mouth daily.     azelastine (ASTELIN) 0.1 % nasal spray Place 1 spray into both nostrils 2 (two) times daily. Use in each nostril as directed (Patient taking differently: Place 1 spray into both nostrils daily. Use in each nostril as directed) 30 mL 5   baclofen (LIORESAL) 10 MG tablet TAKE 1 TABLET BY MOUTH TWICE A DAY AS NEEDED FOR MUSCLE SPASMS 180 tablet 1   Blood Glucose Monitoring Suppl DEVI Use glucometer to check blood sugars once a day 1 Dose 0   CALCIUM PO Take 1,200 mg by mouth daily.     Cholecalciferol (VITAMIN D3) 125 MCG (5000 UT) CAPS Take  1 capsule Daily     Cyanocobalamin (VITAMIN B12 SL) Place 500 mcg under the tongue daily.     DULoxetine (CYMBALTA) 60 MG capsule TAKE 1 CAPSULE DAILY FOR CHRONIC PAIN SYNDROME. 90 capsule 3   EPINEPHrine 0.3 mg/0.3 mL IJ SOAJ injection Inject 0.3 mg into the muscle once.     famotidine (PEPCID) 40 MG tablet Take  1 tablet    2 x / day (every 12 hours)  to Prevent Heart burn & Indigestion 180 tablet 3   fluticasone (FLONASE) 50 MCG/ACT nasal spray Place 1-2 sprays into both nostrils at bedtime. (Patient taking differently: Place 1 spray into both nostrils at bedtime.) 48 mL 3   glucose blood (ONE TOUCH ULTRA TEST) test strip Check Blood sugar Daily for diabetes 100 each 3   Lancets (ONETOUCH ULTRASOFT) lancets Check Blood sugar  daily 100 each 3   levothyroxine (SYNTHROID) 125 MCG tablet Take 1 tablet (125 mcg total) by mouth daily. 30 tablet 11   loperamide (IMODIUM) 2 MG capsule Take 1 capsule (2 mg total) by mouth as needed for diarrhea or loose stools. 30 capsule 0   losartan (COZAAR) 100 MG tablet TAKE 1  TABLET BY MOUTH DAILY FOR BLOOD PRESSURE AND KIDNEY PROTECTION 90 tablet 3   Magnesium 250 MG TABS Take 250 mg by mouth 2 (two) times daily.     metFORMIN (GLUCOPHAGE-XR) 500 MG 24 hr tablet TAKE 2 TABLETS TWICE DAILY WITH MEALS FOR DIABETES 360 tablet 3   metoprolol succinate (TOPROL-XL) 50 MG 24 hr tablet Take 1 tablet (50 mg total) by mouth daily. Take with or immediately following a meal. 90 tablet 3   montelukast (SINGULAIR) 10 MG tablet TAKE 1 TABLET BY MOUTH EVERYDAY AT BEDTIME 90 tablet 1   Multiple Vitamins-Minerals (MULTIVITAMIN PO) Take 1 tablet by mouth daily.      nortriptyline (PAMELOR) 10 MG capsule TAKE 1 CAPSULE BY MOUTH EVERYDAY AT BEDTIME 90 capsule 3   Omega-3 Fatty Acids (FISH OIL PO) Take 600 mg by mouth daily.  omeprazole (PRILOSEC) 20 MG capsule TAKE 1 CAPSULE DAILY TO PREVENT HEARTBURN & ACID REFLUX 90 capsule 3   PRESCRIPTION MEDICATION Inject as directed every 30 (thirty) days. Allergy Shot     rosuvastatin (CRESTOR) 10 MG tablet TAKE 1 TABLET BY MOUTH EVERY DAY FOR CHOLESTEROL 90 tablet 3   SHINGRIX injection      tirzepatide (MOUNJARO) 15 MG/0.5ML Pen Inject 15 mg into the skin every 7 (seven) days for diabetes. 6 mL 1   Turmeric (QC TUMERIC COMPLEX PO) Take 1 tablet by mouth daily.     gabapentin (NEURONTIN) 100 MG capsule Take  1 to 3 capsules   1 to 2 hours  before Bedtime  as needed fr Sleep 90 capsule 2   modafinil (PROVIGIL) 200 MG tablet Take  1/2 to 1 tablet  every Morning  as needed for Excessive Sleepiness 30 tablet 0   trazodone (DESYREL) 300 MG tablet Take  1 tablet 1 to 2 hours before Bedtime as needed for Sleep          /       TAKE      BY      MOUTH 30 tablet 0   No facility-administered medications prior to visit.    PAST MEDICAL HISTORY: Past Medical History:  Diagnosis Date   Allergy    Anxiety    Arthritis    Cataract    Chronic kidney disease (CKD), stage II (mild) 07/01/2014   Overview:  Overview:  Due to DM, last GFR 64     Degenerative tear of triangular fibrocartilage complex (TFCC) of left wrist 01/17/2019   Depression    Fibromyalgia    GERD (gastroesophageal reflux disease)    Hyperlipidemia    Hypertension    Hypothyroidism    Hypoxemia    IBS (irritable bowel syndrome)    Liver disease    Migraine 02/19/2015   Migraines    Obesity    Osteoporosis    Oxygen deficiency    wears oxygen at noc- 2 liters   Pneumonia    Sensorineural hearing loss (SNHL) of left ear with unrestricted hearing of right ear 12/31/2017   Sleep apnea    uses oral device none   Sleep difficulties    Thyroid disease    hypothyroidism   Tuberculosis    positive PPD test, chest x ray negative   Type II or unspecified type diabetes mellitus without mention of complication, not stated as uncontrolled    Urticaria    Ventral hernia    Vertigo 02/19/2015    PAST SURGICAL HISTORY: Past Surgical History:  Procedure Laterality Date   ABDOMINAL HYSTERECTOMY     ADENOIDECTOMY     CARPAL TUNNEL RELEASE     bilateral   CATARACT EXTRACTION, BILATERAL     CESAREAN SECTION     x2   CHOLECYSTECTOMY     COLONOSCOPY     COLONOSCOPY WITH PROPOFOL N/A 05/07/2023   Procedure: COLONOSCOPY WITH PROPOFOL;  Surgeon: Napoleon Form, MD;  Location: [facility];  Service: Gastroenterology;  Laterality: N/A;   HERNIA REPAIR  07/24/11   ventral hernia   JOINT REPLACEMENT     bilateral knees   KNEE ARTHROSCOPY     bilateral   POLYPECTOMY  05/07/2023   Procedure: POLYPECTOMY;  Surgeon: Napoleon Form, MD;  Location: [facility];  Service: Gastroenterology;;   SINOSCOPY     TONSILLECTOMY     TOTAL KNEE ARTHROPLASTY     bilateral  FAMILY HISTORY: Family History  Problem Relation Age of Onset   Breast cancer Mother    Hypertension Father    Hyperlipidemia Father    Prostate cancer Father    Hyperlipidemia Sister    Stomach cancer Son    Esophageal cancer Son    Colon cancer Son    Colon polyps Neg Hx    Diabetes Neg  Hx    Kidney disease Neg Hx    Migraines Neg Hx    Rectal cancer Neg Hx     SOCIAL HISTORY: Social History   Socioeconomic History   Marital status: Married    Spouse name: Fredrik Cove   Number of children: 2   Years of education: masters   Highest education level: Not on file  Occupational History   Occupation: Retired Runner, broadcasting/film/video  Tobacco Use   Smoking status: Never   Smokeless tobacco: Never  Vaping Use   Vaping status: Never Used  Substance and Sexual Activity   Alcohol use: Yes    Alcohol/week: 0.0 standard drinks of alcohol    Comment: occasional   Drug use: No   Sexual activity: Not on file  Other Topics Concern   Not on file  Social History Narrative   Lives with husband, Fredrik Cove   Patient is right handed.   Patient drinks 64 oz of diet soda daily.   Social Drivers of Corporate investment banker Strain: Not on file  Food Insecurity: Not on file  Transportation Needs: Not on file  Physical Activity: Not on file  Stress: Not on file  Social Connections: Not on file  Intimate Partner Violence: Not on file   PHYSICAL EXAM  Vitals:   01/02/24 1237  BP: 104/66  Pulse: 82  Weight: 166 lb (75.3 kg)  Height: 5\' 3"  (1.6 m)    Body mass index is 29.41 kg/m.  Generalized: Well developed, in no acute distress Neurological examination  Mentation: Alert oriented to time, place, history taking. Follows all commands speech and language fluent Cranial nerve II-XII: Pupils were equal round reactive to light. Extraocular movements were full, visual field were full on confrontational test. Facial sensation and strength were normal.  Head turning and shoulder shrug were normal and symmetric.   Motor: The motor testing reveals 5 over 5 strength of all 4 extremities. Good symmetric motor tone is noted throughout.  Sensory: Sensory testing is intact to soft touch on all 4 extremities. No evidence of extinction is noted.  Coordination: Cerebellar testing reveals good  finger-nose-finger and heel-to-shin bilaterally.  No tremor seen. Gait and station: Gait is normal.  Reflexes: Deep tendon reflexes are symmetric and normal bilaterally.   DIAGNOSTIC DATA (LABS, IMAGING, TESTING) - I reviewed patient records, labs, notes, testing and imaging myself where available.  Lab Results  Component Value Date   WBC 12.2 (H) 11/06/2023   HGB 13.6 11/06/2023   HCT 41.3 11/06/2023   MCV 85.5 11/06/2023   PLT 393 11/06/2023      Component Value Date/Time   NA 133 (L) 11/06/2023 1540   NA 136 10/04/2023 1309   K 4.1 11/06/2023 1540   CL 97 (L) 11/06/2023 1540   CO2 27 11/06/2023 1540   GLUCOSE 101 (H) 11/06/2023 1540   BUN 8 11/06/2023 1540   BUN 13 10/04/2023 1309   CREATININE 0.79 11/06/2023 1540   CALCIUM 9.6 11/06/2023 1540   PROT 6.5 11/06/2023 1540   PROT 7.0 10/04/2023 1309   ALBUMIN 4.6 10/04/2023 1309   AST 17  11/06/2023 1540   ALT 16 11/06/2023 1540   ALKPHOS 88 10/04/2023 1309   BILITOT 0.6 11/06/2023 1540   BILITOT 0.4 10/04/2023 1309   GFRNONAA 82 12/22/2020 1625   GFRAA 94 12/22/2020 1625   Lab Results  Component Value Date   CHOL 108 11/06/2023   HDL 45 (L) 11/06/2023   LDLCALC 40 11/06/2023   TRIG 152 (H) 11/06/2023   CHOLHDL 2.4 11/06/2023   Lab Results  Component Value Date   HGBA1C 5.6 11/06/2023   Lab Results  Component Value Date   VITAMINB12 >2000 (H) 10/04/2023   Lab Results  Component Value Date   TSH 0.15 (L) 11/06/2023   ASSESSMENT AND PLAN 73 y.o. year old female  has a past medical history of Allergy, Anxiety, Arthritis, Cataract, Chronic kidney disease (CKD), stage II (mild) (07/01/2014), Degenerative tear of triangular fibrocartilage complex (TFCC) of left wrist (01/17/2019), Depression, Fibromyalgia, GERD (gastroesophageal reflux disease), Hyperlipidemia, Hypertension, Hypothyroidism, Hypoxemia, IBS (irritable bowel syndrome), Liver disease, Migraine (02/19/2015), Migraines, Obesity, Osteoporosis, Oxygen  deficiency, Pneumonia, Sensorineural hearing loss (SNHL) of left ear with unrestricted hearing of right ear (12/31/2017), Sleep apnea, Sleep difficulties, Thyroid disease, Tuberculosis, Type II or unspecified type diabetes mellitus without mention of complication, not stated as uncontrolled, Urticaria, Ventral hernia, and Vertigo (02/19/2015). here with:  1.  Migraine headache 2.  Tremor, essentially resolved  -Headache remains under excellent control.  Continue nortriptyline 10 mg at bedtime for headache prevention. -Continue baclofen 10 mg as needed for acute headache -Follow-up in 1 year or sooner if needed  Addendum 10/22/23 SS: PCP referred for sleep study, saw Dr. Vickey Huger for hypersomnia had [facility] 10/15/23 Fragmented sleep, prolonged sleep latency and very brief  REM sleep periods. No apnea, no PLMs, no EMG activity in REM sleep, normal EEG , EKG. Patient was on 2 L/m of oxygen during this study. An organic sleep disorder was not documented. No follow up in sleep medicine needed.   Margie Ege, AGNP-C, DNP 01/02/2024, 12:56 PM [facility] [address] [place], [place] [address] [phone]

## 2024-01-02 ENCOUNTER — Ambulatory Visit (INDEPENDENT_AMBULATORY_CARE_PROVIDER_SITE_OTHER): Payer: Medicare Other | Admitting: Neurology

## 2024-01-02 ENCOUNTER — Encounter: Payer: Self-pay | Admitting: Neurology

## 2024-01-02 VITALS — BP 104/66 | HR 82 | Ht 63.0 in | Wt 166.0 lb

## 2024-01-02 DIAGNOSIS — G43009 Migraine without aura, not intractable, without status migrainosus: Secondary | ICD-10-CM

## 2024-01-02 MED ORDER — NORTRIPTYLINE HCL 10 MG PO CAPS: ORAL_CAPSULE | ORAL | 3 refills | Status: AC

## 2024-01-02 NOTE — Patient Instructions (Signed)
 Great to see you today.  We will continue nortriptyline for headache prevention.  Use baclofen as needed for headache.  Follow-up in 1 year or sooner if needed.  Thanks

## 2024-01-08 DIAGNOSIS — K080 Exfoliation of teeth due to systemic causes: Secondary | ICD-10-CM | POA: Diagnosis not present

## 2024-01-21 ENCOUNTER — Ambulatory Visit (INDEPENDENT_AMBULATORY_CARE_PROVIDER_SITE_OTHER): Payer: Self-pay

## 2024-01-21 DIAGNOSIS — J309 Allergic rhinitis, unspecified: Secondary | ICD-10-CM | POA: Diagnosis not present

## 2024-01-29 DIAGNOSIS — E119 Type 2 diabetes mellitus without complications: Secondary | ICD-10-CM | POA: Diagnosis not present

## 2024-02-03 ENCOUNTER — Other Ambulatory Visit: Payer: Self-pay | Admitting: Allergy & Immunology

## 2024-02-05 ENCOUNTER — Ambulatory Visit: Payer: Medicare Other | Admitting: Nurse Practitioner

## 2024-02-07 ENCOUNTER — Other Ambulatory Visit (HOSPITAL_COMMUNITY): Payer: Self-pay

## 2024-02-08 ENCOUNTER — Other Ambulatory Visit: Payer: Self-pay | Admitting: Family Medicine

## 2024-02-08 ENCOUNTER — Other Ambulatory Visit: Payer: Self-pay | Admitting: Internal Medicine

## 2024-02-08 DIAGNOSIS — E1129 Type 2 diabetes mellitus with other diabetic kidney complication: Secondary | ICD-10-CM

## 2024-02-08 NOTE — Telephone Encounter (Signed)
 Copied from CRM [id number]. Topic: Clinical - Medication Refill >> Feb 08, 2024 11:28 AM Patsy Lager T wrote: Most Recent Primary Care Visit:   Medication: tirzepatide Greggory Keen) 15 MG/0.5ML Pen   Has the patient contacted their pharmacy? Yes  Is this the correct pharmacy for this prescription? Yes If no, delete pharmacy and type the correct one.  This is the patient's preferred pharmacy:   [place] [facility] [address] [place] [address] Phone: [phone] Fax: [phone]  Has the prescription been filled recently? Yes  Is the patient out of the medication? Yes  Has the patient been seen for an appointment in the last year OR does the patient have an upcoming appointment? Yes  Can we respond through MyChart? No  Agent: Please be advised that Rx refills may take up to 3 business days. We ask that you follow-up with your pharmacy.

## 2024-02-08 NOTE — Telephone Encounter (Unsigned)
 Copied from CRM [id number]. Topic: Clinical - Medication Refill >> Feb 08, 2024 11:27 AM Patsy Lager T wrote: Most Recent Primary Care Visit:   Medication: tirzepatide Greggory Keen) 15 MG/0.5ML Pen   Has the patient contacted their pharmacy? No  Is this the correct pharmacy for this prescription? Yes If no, delete pharmacy and type the correct one.  This is the patient's preferred pharmacy:  [id number] [place] - [address] [place] [address] Phone: [phone] Fax: [phone]  [place] [facility] [address] [place] [address] Phone: [phone] Fax: [phone]   Has the prescription been filled recently? Yes  Is the patient out of the medication? Yes  Has the patient been seen for an appointment in the last year OR does the patient have an upcoming appointment? Yes  Can we respond through MyChart? No  Agent: Please be advised that Rx refills may take up to 3 business days. We ask that you follow-up with your pharmacy.

## 2024-02-09 ENCOUNTER — Other Ambulatory Visit (HOSPITAL_COMMUNITY): Payer: Self-pay

## 2024-02-09 ENCOUNTER — Other Ambulatory Visit (HOSPITAL_BASED_OUTPATIENT_CLINIC_OR_DEPARTMENT_OTHER): Payer: Self-pay

## 2024-02-09 MED ORDER — TIRZEPATIDE 15 MG/0.5ML ~~LOC~~ SOAJ
15.0000 mg | SUBCUTANEOUS | 1 refills | Status: DC
  Filled 2024-02-09: qty 6, 84d supply, fill #0
  Filled 2024-05-01: qty 6, 84d supply, fill #1

## 2024-02-10 NOTE — Patient Instructions (Incomplete)
 Allergic rhinitis Continue allergen immunotherapy directed toward grass pollen, weed pollen, tree pollen, cat, mold, and dog as listed below Continue montelukast 10 mg once a day to control allergy symptoms Continue Flonase 2 sprays in each nostril once a day if needed for a stuffy nose Continue azelastine 2 sprays in each nostril up to twice a day if needed for runny nose Consider saline nasal rinses as needed for nasal symptoms. Use this before any medicated nasal sprays for best result Continue allergen immunotherapy and have access to an epinephrine autoinjector set per protocol  Call the clinic if this treatment plan is not working well for you  Follow up in *** or sooner if needed.  Reducing Pollen Exposure The American Academy of Allergy, Asthma and Immunology suggests the following steps to reduce your exposure to pollen during allergy seasons. Do not hang sheets or clothing out to dry; pollen may collect on these items. Do not mow lawns or spend time around freshly cut grass; mowing stirs up pollen. Keep windows closed at night.  Keep car windows closed while driving. Minimize morning activities outdoors, a time when pollen counts are usually at their highest. Stay indoors as much as possible when pollen counts or humidity is high and on windy days when pollen tends to remain in the air longer. Use air conditioning when possible.  Many air conditioners have filters that trap the pollen spores. Use a HEPA room air filter to remove pollen form the indoor air you breathe.  Control of Mold Allergen Mold and fungi can grow on a variety of surfaces provided certain temperature and moisture conditions exist.  Outdoor molds grow on plants, decaying vegetation and soil.  The major outdoor mold, Alternaria and Cladosporium, are found in very high numbers during hot and dry conditions.  Generally, a late Summer - Fall peak is seen for common outdoor fungal spores.  Rain will temporarily lower  outdoor mold spore count, but counts rise rapidly when the rainy period ends.  The most important indoor molds are Aspergillus and Penicillium.  Dark, humid and poorly ventilated basements are ideal sites for mold growth.  The next most common sites of mold growth are the bathroom and the kitchen.  Outdoor [employer] Use air conditioning and keep windows closed Avoid exposure to decaying vegetation. Avoid leaf raking. Avoid grain handling. Consider wearing a face mask if working in moldy areas.  Indoor Mold Control Maintain humidity below 50%. Clean washable surfaces with 5% bleach solution. Remove sources e.g. Contaminated carpets.  Control of Dog or Cat Allergen Avoidance is the best way to manage a dog or cat allergy. If you have a dog or cat and are allergic to dog or cats, consider removing the dog or cat from the home. If you have a dog or cat but don't want to find it a new home, or if your family wants a pet even though someone in the household is allergic, here are some strategies that may help keep symptoms at bay:  Keep the pet out of your bedroom and restrict it to only a few rooms. Be advised that keeping the dog or cat in only one room will not limit the allergens to that room. Don't pet, hug or kiss the dog or cat; if you do, wash your hands with soap and water. High-efficiency particulate air (HEPA) cleaners run continuously in a bedroom or living room can reduce allergen levels over time. Regular use of a high-efficiency vacuum cleaner or a central  vacuum can reduce allergen levels. Giving your dog or cat a bath at least once a week can reduce airborne allergen.

## 2024-02-10 NOTE — Progress Notes (Unsigned)
   [address] [place] [address] Dept: [phone]  FOLLOW UP NOTE  Patient ID: Grace Barry, female    DOB: [birth year]  Age: 73 y.o. MRN: [id number] Date of Office Visit: 02/11/2024  Assessment  Chief Complaint: No chief complaint on file.  HPI Grace Barry is a 73 year old female who presents to the clinic for follow-up visit.  She passed a penicillin challenge on 12/14/2022 and a doxycycline challenge on 01/04/2023.  Prior to that she was last seen in this clinic on 12/07/2022 by Dr. Idolina Maker for evaluation of allergic rhinitis on allergen immunotherapy.  Discussed the use of AI scribe software for clinical note transcription with the patient, who gave verbal consent to proceed.  History of Present Illness      Drug Allergies:  Allergies  Allergen Reactions   Floxin [Ofloxacin] Hives   Invokana [Canagliflozin]     Hematuria- avoid class   Viberzi [Eluxadoline] Hives    Physical Exam: There were no vitals taken for this visit.   Physical Exam  Diagnostics:    Assessment and Plan: No diagnosis found.  No orders of the defined types were placed in this encounter.   There are no Patient Instructions on file for this visit.  No follow-ups on file.    Thank you for the opportunity to care for this patient.  Please do not hesitate to contact me with questions.  Marinus Sic, FNP [facility]

## 2024-02-11 ENCOUNTER — Ambulatory Visit (INDEPENDENT_AMBULATORY_CARE_PROVIDER_SITE_OTHER): Admitting: Family Medicine

## 2024-02-11 ENCOUNTER — Other Ambulatory Visit: Payer: Self-pay

## 2024-02-11 ENCOUNTER — Encounter: Payer: Self-pay | Admitting: Family Medicine

## 2024-02-11 VITALS — BP 110/56 | HR 78 | Temp 97.3°F | Resp 16 | Ht 62.4 in | Wt 169.0 lb

## 2024-02-11 DIAGNOSIS — J3089 Other allergic rhinitis: Secondary | ICD-10-CM

## 2024-02-11 DIAGNOSIS — J302 Other seasonal allergic rhinitis: Secondary | ICD-10-CM

## 2024-02-11 MED ORDER — AZELASTINE-FLUTICASONE 137-50 MCG/ACT NA SUSP: 2.0000 | NASAL | 5 refills | Status: AC

## 2024-02-11 MED ORDER — EPINEPHRINE 0.3 MG/0.3ML IJ SOAJ: 0.3000 mg | Freq: Once | INTRAMUSCULAR | 0 refills | Status: AC

## 2024-02-12 MED ORDER — MONTELUKAST SODIUM 10 MG PO TABS: 10.0000 mg | ORAL_TABLET | Freq: Every day | ORAL | 1 refills | Status: DC

## 2024-02-13 ENCOUNTER — Ambulatory Visit: Payer: Self-pay | Admitting: Family Medicine

## 2024-02-22 ENCOUNTER — Ambulatory Visit (INDEPENDENT_AMBULATORY_CARE_PROVIDER_SITE_OTHER): Payer: Self-pay

## 2024-02-22 DIAGNOSIS — J309 Allergic rhinitis, unspecified: Secondary | ICD-10-CM

## 2024-02-25 ENCOUNTER — Encounter: Payer: Self-pay | Admitting: Family Medicine

## 2024-02-28 DIAGNOSIS — E119 Type 2 diabetes mellitus without complications: Secondary | ICD-10-CM | POA: Diagnosis not present

## 2024-02-29 ENCOUNTER — Ambulatory Visit (INDEPENDENT_AMBULATORY_CARE_PROVIDER_SITE_OTHER): Admitting: Family

## 2024-02-29 VITALS — BP 102/60 | HR 96 | Temp 98.4°F | Ht 62.4 in | Wt 164.0 lb

## 2024-02-29 DIAGNOSIS — M797 Fibromyalgia: Secondary | ICD-10-CM | POA: Diagnosis not present

## 2024-02-29 DIAGNOSIS — G9332 Myalgic encephalomyelitis/chronic fatigue syndrome: Secondary | ICD-10-CM

## 2024-02-29 DIAGNOSIS — E039 Hypothyroidism, unspecified: Secondary | ICD-10-CM

## 2024-02-29 LAB — TSH: TSH: 86.01 u[IU]/mL — ABNORMAL HIGH (ref 0.35–5.50)

## 2024-02-29 NOTE — Progress Notes (Unsigned)
 Acute Office Visit  Subjective:     Patient ID: Grace Barry, female    DOB: Mar 05, 1951, 73 y.o.   MRN: [id number]  Chief Complaint  Patient presents with   Fatigue    Patient states she has been experiencing bad fatigue for awhile. And also wanted to go over her TSH     HPI Patient is in today with c/o extreme fatigue over the last several weeks. She is a former patient of Dr. Mylinda Asa. She reports that Dr. Mylinda Asa told her to decrease her Synthroid  125 mcg to 1/2 tablet from a full tablet, 3 months ago. She also admits to being more cold than usual. No changes in skin or hair. She has an appt to establish with PCP in 2 weeks.   Review of Systems  Constitutional:  Positive for malaise/fatigue.  Endo/Heme/Allergies:        Cold a lot  All other systems reviewed and are negative.  Past Medical History:  Diagnosis Date   Allergy    Anxiety    Arthritis    Cataract    Chronic kidney disease (CKD), stage II (mild) 07/01/2014   Overview:  Overview:  Due to DM, last GFR 64    Degenerative tear of triangular fibrocartilage complex (TFCC) of left wrist 01/17/2019   Depression    Fibromyalgia    GERD (gastroesophageal reflux disease)    Hyperlipidemia    Hypertension    Hypothyroidism    Hypoxemia    IBS (irritable bowel syndrome)    Liver disease    Migraine 02/19/2015   Migraines    Obesity    Osteoporosis    Oxygen  deficiency    wears oxygen  at [facility]- 2 liters   Pneumonia    Sensorineural hearing loss (SNHL) of left ear with unrestricted hearing of right ear 12/31/2017   Sleep apnea    uses oral device none   Sleep difficulties    Thyroid  disease    hypothyroidism   Tuberculosis    positive PPD test, chest x ray negative   Type II or unspecified type diabetes mellitus without mention of complication, not stated as uncontrolled    Urticaria    Ventral hernia    Vertigo 02/19/2015    Social History   Socioeconomic History   Marital status: Married    Spouse  name: Geographical information systems officer   Number of children: 2   Years of education: masters   Highest education level: Master's degree (e.g., MA, MS, MEng, MEd, MSW, MBA)  Occupational History   Occupation: Retired Runner, broadcasting/film/video  Tobacco Use   Smoking status: Never   Smokeless tobacco: Never  Vaping Use   Vaping status: Never Used  Substance and Sexual Activity   Alcohol use: Yes    Alcohol/week: 0.0 standard drinks of alcohol    Comment: occasional   Drug use: No   Sexual activity: Not on file  Other Topics Concern   Not on file  Social History Narrative   Lives with husband, Audrene Blessing   Patient is right handed.   Patient drinks 64 oz of diet soda daily.   Social Drivers of Corporate investment banker Strain: Low Risk  (02/26/2024)   Overall Financial Resource Strain (CARDIA)    Difficulty of Paying Living Expenses: Not hard at all  Food Insecurity: No Food Insecurity (02/26/2024)   Hunger Vital Sign    Worried About Running Out of Food in the Last Year: Never true    Ran Out of Food in  the Last Year: Never true  Transportation Needs: No Transportation Needs (02/26/2024)   PRAPARE - Administrator, Civil Service (Medical): No    Lack of Transportation (Non-Medical): No  Physical Activity: Unknown (02/26/2024)   Exercise Vital Sign    Days of Exercise per Week: 0 days    Minutes of Exercise per Session: Not on file  Stress: No Stress Concern Present (02/26/2024)   [employer] of Occupational Health - Occupational Stress Questionnaire    Feeling of Stress : Only a little  Social Connections: Moderately Isolated (02/26/2024)   Social Connection and Isolation Panel [NHANES]    Frequency of Communication with Friends and Family: Twice a week    Frequency of Social Gatherings with Friends and Family: Twice a week    Attends Religious Services: Never    Diplomatic Services operational officer: No    Attends Engineer, structural: Not on file    Marital Status: Married  Catering manager  Violence: Not on file    Past Surgical History:  Procedure Laterality Date   ABDOMINAL HYSTERECTOMY     ADENOIDECTOMY     CARPAL TUNNEL RELEASE     bilateral   CATARACT EXTRACTION, BILATERAL     CESAREAN SECTION     x2   CHOLECYSTECTOMY     COLONOSCOPY     COLONOSCOPY WITH PROPOFOL  N/A 05/07/2023   Procedure: COLONOSCOPY WITH PROPOFOL ;  Surgeon: Sergio Dandy, MD;  Location: [facility];  Service: Gastroenterology;  Laterality: N/A;   HERNIA REPAIR  07/24/11   ventral hernia   JOINT REPLACEMENT     bilateral knees   KNEE ARTHROSCOPY     bilateral   POLYPECTOMY  05/07/2023   Procedure: POLYPECTOMY;  Surgeon: Sergio Dandy, MD;  Location: [facility];  Service: Gastroenterology;;   SINOSCOPY     TONSILLECTOMY     TOTAL KNEE ARTHROPLASTY     bilateral    Family History  Problem Relation Age of Onset   Breast cancer Mother    Hypertension Father    Hyperlipidemia Father    Prostate cancer Father    Hyperlipidemia Sister    Stomach cancer Son    Esophageal cancer Son    Colon cancer Son    Colon polyps Neg Hx    Diabetes Neg Hx    Kidney disease Neg Hx    Migraines Neg Hx    Rectal cancer Neg Hx     Allergies  Allergen Reactions   Floxin [Ofloxacin] Hives   Invokana  [Canagliflozin ]     Hematuria- avoid class   Viberzi  [Eluxadoline ] Hives    Current Outpatient Medications on File Prior to Visit  Medication Sig Dispense Refill   aspirin 81 MG tablet Take 81 mg by mouth daily.     Azelastine -Fluticasone  (DYMISTA ) 137-50 MCG/ACT SUSP Place 2 sprays into both nostrils 1 day or 1 dose. 23 g 5   baclofen  (LIORESAL ) 10 MG tablet TAKE 1 TABLET BY MOUTH TWICE A DAY AS NEEDED FOR MUSCLE SPASMS 180 tablet 1   Blood Glucose Monitoring Suppl DEVI Use glucometer to check blood sugars once a day 1 Dose 0   CALCIUM  PO Take 1,200 mg by mouth daily.     Cholecalciferol (VITAMIN D3) 125 MCG (5000 UT) CAPS Take  1 capsule Daily     Cyanocobalamin (VITAMIN B12 SL) Place  500 mcg under the tongue daily.     DULoxetine  (CYMBALTA ) 60 MG capsule TAKE 1 CAPSULE DAILY FOR CHRONIC  PAIN SYNDROME. 90 capsule 3   famotidine  (PEPCID ) 40 MG tablet Take  1 tablet    2 x / day (every 12 hours)  to Prevent Heart burn & Indigestion 180 tablet 3   glucose blood (ONE TOUCH ULTRA TEST) test strip Check Blood sugar Daily for diabetes 100 each 3   Lancets (ONETOUCH ULTRASOFT) lancets Check Blood sugar  daily 100 each 3   levothyroxine  (SYNTHROID ) 125 MCG tablet Take 1 tablet (125 mcg total) by mouth daily. 30 tablet 11   loperamide  (IMODIUM ) 2 MG capsule Take 1 capsule (2 mg total) by mouth as needed for diarrhea or loose stools. 30 capsule 0   losartan  (COZAAR ) 100 MG tablet TAKE 1 TABLET BY MOUTH DAILY FOR BLOOD PRESSURE AND KIDNEY PROTECTION 90 tablet 3   Magnesium 250 MG TABS Take 250 mg by mouth 2 (two) times daily.     metFORMIN  (GLUCOPHAGE -XR) 500 MG 24 hr tablet TAKE 2 TABLETS TWICE DAILY WITH MEALS FOR DIABETES 360 tablet 3   metoprolol  succinate (TOPROL -XL) 50 MG 24 hr tablet Take 1 tablet (50 mg total) by mouth daily. Take with or immediately following a meal. 90 tablet 3   montelukast  (SINGULAIR ) 10 MG tablet Take 1 tablet (10 mg total) by mouth at bedtime. 90 tablet 1   Multiple Vitamins-Minerals (MULTIVITAMIN PO) Take 1 tablet by mouth daily.      nortriptyline  (PAMELOR ) 10 MG capsule TAKE 1 CAPSULE BY MOUTH EVERYDAY AT BEDTIME 90 capsule 3   Omega-3 Fatty Acids (FISH OIL PO) Take 600 mg by mouth daily.     omeprazole  (PRILOSEC) 20 MG capsule TAKE 1 CAPSULE DAILY TO PREVENT HEARTBURN & ACID REFLUX 90 capsule 3   PRESCRIPTION MEDICATION Inject as directed every 30 (thirty) days. Allergy Shot     rosuvastatin  (CRESTOR ) 10 MG tablet TAKE 1 TABLET BY MOUTH EVERY DAY FOR CHOLESTEROL 90 tablet 3   SHINGRIX injection      tirzepatide  (MOUNJARO ) 15 MG/0.5ML Pen Inject 15 mg into the skin every 7 (seven) days for diabetes. 6 mL 1   Turmeric (QC TUMERIC COMPLEX PO) Take 1 tablet  by mouth daily.     No current facility-administered medications on file prior to visit.    BP 102/60 (BP Location: Left Arm, Patient Position: Sitting, Cuff Size: Normal)   Pulse 96   Temp 98.4 F (36.9 C) (Oral)   Ht 5' 2.4" (1.585 m)   Wt 164 lb (74.4 kg)   SpO2 97%   BMI 29.61 kg/m chart      Objective:    BP 102/60 (BP Location: Left Arm, Patient Position: Sitting, Cuff Size: Normal)   Pulse 96   Temp 98.4 F (36.9 C) (Oral)   Ht 5' 2.4" (1.585 m)   Wt 164 lb (74.4 kg)   SpO2 97%   BMI 29.61 kg/m    Physical Exam Vitals and nursing note reviewed.  Constitutional:      Appearance: Normal appearance.  Cardiovascular:     Rate and Rhythm: Normal rate and regular rhythm.     Pulses: Normal pulses.     Heart sounds: Normal heart sounds.  Pulmonary:     Effort: Pulmonary effort is normal.     Breath sounds: Normal breath sounds.  Abdominal:     General: Abdomen is flat. Bowel sounds are normal.     Palpations: Abdomen is soft.  Musculoskeletal:     Cervical back: Normal range of motion and neck supple.  Skin:  General: Skin is warm and dry.  Neurological:     General: No focal deficit present.     Mental Status: She is alert and oriented to person, place, and time. Mental status is at baseline.  Psychiatric:        Mood and Affect: Mood normal.        Behavior: Behavior normal.        Thought Content: Thought content normal.     No results found for any visits on 02/29/24.      Assessment & Plan:   Problem List Items Addressed This Visit     Hypothyroidism   Relevant Orders   TSH   Chronic fatigue syndrome with fibromyalgia - Primary   Relevant Orders   TSH   CBC w/Diff   Call the office if symptoms worsen or persist. We will follow-up pending labs and sooner as needed.  Recheck as scheduled and sooner as needed.  No orders of the defined types were placed in this encounter.   No follow-ups on file.  Margarit Minshall B Takirah Binford, FNP

## 2024-03-02 ENCOUNTER — Encounter: Payer: Self-pay | Admitting: Family

## 2024-03-02 LAB — CBC WITH DIFFERENTIAL/PLATELET
Basophils Absolute: 0.1 10*3/uL (ref 0.0–0.1)
Basophils Relative: 0.4 % (ref 0.0–3.0)
Eosinophils Absolute: 1 10*3/uL — ABNORMAL HIGH (ref 0.0–0.7)
Eosinophils Relative: 7.6 % — ABNORMAL HIGH (ref 0.0–5.0)
HCT: 42.1 % (ref 36.0–46.0)
Hemoglobin: 13.9 g/dL (ref 12.0–15.0)
Lymphocytes Relative: 28.5 % (ref 12.0–46.0)
Lymphs Abs: 3.7 10*3/uL (ref 0.7–4.0)
MCHC: 32.9 g/dL (ref 30.0–36.0)
MCV: 88.8 fl (ref 78.0–100.0)
Monocytes Absolute: 0.7 10*3/uL (ref 0.1–1.0)
Monocytes Relative: 5.3 % (ref 3.0–12.0)
Neutro Abs: 7.6 10*3/uL (ref 1.4–7.7)
Neutrophils Relative %: 58.2 % (ref 43.0–77.0)
Platelets: 359 10*3/uL (ref 150.0–400.0)
RBC: 4.74 Mil/uL (ref 3.87–5.11)
RDW: 15 % (ref 11.5–15.5)
WBC: 13 10*3/uL — ABNORMAL HIGH (ref 4.0–10.5)

## 2024-03-03 NOTE — Telephone Encounter (Signed)
 LM for pt to return my call for results.

## 2024-03-19 ENCOUNTER — Encounter: Payer: Self-pay | Admitting: Family Medicine

## 2024-03-19 ENCOUNTER — Ambulatory Visit (INDEPENDENT_AMBULATORY_CARE_PROVIDER_SITE_OTHER): Admitting: Family Medicine

## 2024-03-19 VITALS — BP 94/68 | HR 91 | Temp 97.7°F | Ht 62.4 in | Wt 160.6 lb

## 2024-03-19 DIAGNOSIS — N182 Chronic kidney disease, stage 2 (mild): Secondary | ICD-10-CM

## 2024-03-19 DIAGNOSIS — F3340 Major depressive disorder, recurrent, in remission, unspecified: Secondary | ICD-10-CM | POA: Diagnosis not present

## 2024-03-19 DIAGNOSIS — E039 Hypothyroidism, unspecified: Secondary | ICD-10-CM | POA: Diagnosis not present

## 2024-03-19 DIAGNOSIS — G9332 Myalgic encephalomyelitis/chronic fatigue syndrome: Secondary | ICD-10-CM

## 2024-03-19 DIAGNOSIS — M797 Fibromyalgia: Secondary | ICD-10-CM

## 2024-03-19 DIAGNOSIS — E1122 Type 2 diabetes mellitus with diabetic chronic kidney disease: Secondary | ICD-10-CM

## 2024-03-19 DIAGNOSIS — E559 Vitamin D deficiency, unspecified: Secondary | ICD-10-CM | POA: Diagnosis not present

## 2024-03-19 LAB — CBC WITH DIFFERENTIAL/PLATELET
Basophils Absolute: 0.1 10*3/uL (ref 0.0–0.1)
Basophils Relative: 0.7 % (ref 0.0–3.0)
Eosinophils Absolute: 0.4 10*3/uL (ref 0.0–0.7)
Eosinophils Relative: 3.6 % (ref 0.0–5.0)
HCT: 43.7 % (ref 36.0–46.0)
Hemoglobin: 14.4 g/dL (ref 12.0–15.0)
Lymphocytes Relative: 31.3 % (ref 12.0–46.0)
Lymphs Abs: 3.7 10*3/uL (ref 0.7–4.0)
MCHC: 33 g/dL (ref 30.0–36.0)
MCV: 86.9 fl (ref 78.0–100.0)
Monocytes Absolute: 0.7 10*3/uL (ref 0.1–1.0)
Monocytes Relative: 6.1 % (ref 3.0–12.0)
Neutro Abs: 6.8 10*3/uL (ref 1.4–7.7)
Neutrophils Relative %: 58.3 % (ref 43.0–77.0)
Platelets: 374 10*3/uL (ref 150.0–400.0)
RBC: 5.03 Mil/uL (ref 3.87–5.11)
RDW: 14.9 % (ref 11.5–15.5)
WBC: 11.7 10*3/uL — ABNORMAL HIGH (ref 4.0–10.5)

## 2024-03-19 LAB — COMPREHENSIVE METABOLIC PANEL WITH GFR
ALT: 28 U/L (ref 0–35)
AST: 20 U/L (ref 0–37)
Albumin: 4.9 g/dL (ref 3.5–5.2)
Alkaline Phosphatase: 67 U/L (ref 39–117)
BUN: 10 mg/dL (ref 6–23)
CO2: 26 meq/L (ref 19–32)
Calcium: 10 mg/dL (ref 8.4–10.5)
Chloride: 98 meq/L (ref 96–112)
Creatinine, Ser: 0.86 mg/dL (ref 0.40–1.20)
GFR: 66.56 mL/min (ref >60.00)
Glucose, Bld: 126 mg/dL — ABNORMAL HIGH (ref 70–99)
Potassium: 4.1 meq/L (ref 3.5–5.1)
Sodium: 134 meq/L — ABNORMAL LOW (ref 135–145)
Total Bilirubin: 0.6 mg/dL (ref 0.2–1.2)
Total Protein: 7.6 g/dL (ref 6.0–8.3)

## 2024-03-19 LAB — HEMOGLOBIN A1C: Hgb A1c MFr Bld: 5.7 % (ref 4.6–6.5)

## 2024-03-19 LAB — VITAMIN D 25 HYDROXY (VIT D DEFICIENCY, FRACTURES): VITD: 58.6 ng/mL (ref 30.00–100.00)

## 2024-03-19 LAB — TSH: TSH: 20.35 u[IU]/mL — ABNORMAL HIGH (ref 0.35–5.50)

## 2024-03-19 MED ORDER — DULOXETINE HCL 60 MG PO CPEP: ORAL_CAPSULE | ORAL | 3 refills | Status: AC

## 2024-03-19 NOTE — Patient Instructions (Signed)
 Thank you for trusting us  with your health care.  Please go downstairs for labs before you leave.  Call or send a MyChart message when you are due for refills so that we know to send them.  I will be in touch with your results

## 2024-03-19 NOTE — Progress Notes (Signed)
 New Patient Office Visit  Subjective    Patient ID: Grace Barry, female    DOB: 09-29-1951  Age: 73 y.o. MRN: [id number]  CC:  Chief Complaint  Patient presents with   Establish Care    Establish Care. Wants provider to be aware of continuing issues with thyroid     HPI Grace Barry presents to establish care   Other providers:  Cardiologist- Dr. Alda Amas  Orthopedist  Allergist- Idolina Maker Neurologist- Dr. Karla Oven  Pulmonologist- Dr. Gaynell Keeler   Uses oxygen  at night.  Hx of OSA and lost a significant amount of weight and no longer an issue.   Lost 80 lsbs.  She is on Mounjaro   DM- last A1c 5.6%   Hypothyroidism for the past 60 years.   Currently taking levothyroxine  125 mcg for the past 2 weeks.  Her dose was cut in half in January by her previous PCP.   She is not taking biotin that she is aware of  Since then, she has had worsening fatigue, cold hands and feet.   No constipation. 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   Azelastine -Fluticasone  (DYMISTA ) 137-50 MCG/ACT SUSP Place 2 sprays into both nostrils 1 day or 1 dose.   baclofen  (LIORESAL ) 10 MG tablet TAKE 1 TABLET BY MOUTH TWICE A DAY AS NEEDED FOR MUSCLE SPASMS   Blood Glucose Monitoring Suppl DEVI Use glucometer to check blood sugars once a day   CALCIUM  PO Take 1,200 mg by mouth daily.   Cholecalciferol (VITAMIN D3) 125 MCG (5000 UT) CAPS Take  1 capsule Daily   Cyanocobalamin (VITAMIN B12 SL) Place 500 mcg under the tongue daily.   famotidine  (PEPCID ) 40 MG tablet Take  1 tablet    2 x / day (every 12 hours)  to Prevent Heart burn & Indigestion   glucose blood (ONE TOUCH ULTRA TEST) test strip Check Blood sugar Daily for diabetes   Lancets (ONETOUCH ULTRASOFT) lancets Check Blood sugar  daily   levothyroxine   (SYNTHROID ) 125 MCG tablet Take 1 tablet (125 mcg total) by mouth daily.   loperamide  (IMODIUM ) 2 MG capsule Take 1 capsule (2 mg total) by mouth as needed for diarrhea or loose stools.   losartan  (COZAAR ) 100 MG tablet TAKE 1 TABLET BY MOUTH DAILY FOR BLOOD PRESSURE AND KIDNEY PROTECTION   Magnesium 250 MG TABS Take 250 mg by mouth 2 (two) times daily.   metFORMIN  (GLUCOPHAGE -XR) 500 MG 24 hr tablet TAKE 2 TABLETS TWICE DAILY WITH MEALS FOR DIABETES   metoprolol  succinate (TOPROL -XL) 50 MG 24 hr tablet Take 1 tablet (50 mg total) by mouth daily. Take with or immediately following a meal.   montelukast  (SINGULAIR ) 10 MG tablet Take 1 tablet (10 mg total) by mouth at bedtime.   Multiple Vitamins-Minerals (MULTIVITAMIN PO) Take 1 tablet by mouth daily.    nortriptyline  (PAMELOR ) 10 MG capsule TAKE 1 CAPSULE BY MOUTH EVERYDAY AT BEDTIME   Omega-3 Fatty Acids (FISH OIL PO) Take 600 mg by mouth daily.   omeprazole  (PRILOSEC) 20 MG capsule TAKE 1 CAPSULE DAILY TO PREVENT HEARTBURN & ACID REFLUX   PRESCRIPTION MEDICATION Inject as directed every 30 (thirty) days. Allergy Shot   rosuvastatin  (CRESTOR ) 10 MG tablet TAKE 1 TABLET BY MOUTH EVERY  DAY FOR CHOLESTEROL   tirzepatide  (MOUNJARO ) 15 MG/0.5ML Pen Inject 15 mg into the skin every 7 (seven) days for diabetes.   Turmeric (QC TUMERIC COMPLEX PO) Take 1 tablet by mouth daily.   [DISCONTINUED] DULoxetine  (CYMBALTA ) 60 MG capsule TAKE 1 CAPSULE DAILY FOR CHRONIC PAIN SYNDROME.   DULoxetine  (CYMBALTA ) 60 MG capsule TAKE 1 CAPSULE DAILY FOR CHRONIC PAIN SYNDROME.   [DISCONTINUED] SHINGRIX injection    No facility-administered encounter medications on file as of 03/19/2024.    Past Medical History:  Diagnosis Date   Allergy    Anxiety    Arthritis    Cataract    Chronic kidney disease (CKD), stage II (mild) 07/01/2014   Overview:  Overview:  Due to DM, last GFR 64    Degenerative tear of triangular fibrocartilage complex (TFCC) of left wrist  01/17/2019   Depression    Fibromyalgia    GERD (gastroesophageal reflux disease)    Hyperlipidemia    Hypertension    Hypothyroidism    Hypoxemia    IBS (irritable bowel syndrome)    Liver disease    Migraine 02/19/2015   Migraines    Obesity    Osteoporosis    Oxygen  deficiency    wears oxygen  at [facility]- 2 liters   Pneumonia    Sensorineural hearing loss (SNHL) of left ear with unrestricted hearing of right ear 12/31/2017   Sleep apnea    uses oral device none   Sleep difficulties    Thyroid  disease    hypothyroidism   Tuberculosis    positive PPD test, chest x ray negative   Type II or unspecified type diabetes mellitus without mention of complication, not stated as uncontrolled    Urticaria    Ventral hernia    Vertigo 02/19/2015    Past Surgical History:  Procedure Laterality Date   ABDOMINAL HYSTERECTOMY     ADENOIDECTOMY     APPENDECTOMY     CARPAL TUNNEL RELEASE     bilateral   CATARACT EXTRACTION, BILATERAL     CESAREAN SECTION     x2   CHOLECYSTECTOMY     COLONOSCOPY     COLONOSCOPY WITH PROPOFOL  N/A 05/07/2023   Procedure: COLONOSCOPY WITH PROPOFOL ;  Surgeon: Sergio Dandy, MD;  Location: [facility];  Service: Gastroenterology;  Laterality: N/A;   HERNIA REPAIR  07/24/2011   ventral hernia   JOINT REPLACEMENT     bilateral knees   KNEE ARTHROSCOPY     bilateral   POLYPECTOMY  05/07/2023   Procedure: POLYPECTOMY;  Surgeon: Sergio Dandy, MD;  Location: [facility];  Service: Gastroenterology;;   SINOSCOPY     TONSILLECTOMY     TOTAL KNEE ARTHROPLASTY     bilateral   TUBAL LIGATION      Family History  Problem Relation Age of Onset   Breast cancer Mother    Arthritis Mother    Cancer Mother    Vision loss Mother    Hypertension Father    Hyperlipidemia Father    Prostate cancer Father    Cancer Father    Hearing loss Father    Hyperlipidemia Sister    Cancer Son    Stomach cancer Son    Esophageal cancer Son    Colon  cancer Son    Asthma Maternal Grandfather    Stroke Maternal Grandfather    Vision loss Maternal Grandmother    Colon polyps Neg Hx    Diabetes Neg Hx    Kidney disease Neg Hx  Migraines Neg Hx    Rectal cancer Neg Hx     Social History   Socioeconomic History   Marital status: Married    Spouse name: Grace Barry   Number of children: 2   Years of education: masters   Highest education level: Master's degree (e.g., MA, MS, MEng, MEd, MSW, MBA)  Occupational History   Occupation: Retired Runner, broadcasting/film/video  Tobacco Use   Smoking status: Never   Smokeless tobacco: Never  Vaping Use   Vaping status: Never Used  Substance and Sexual Activity   Alcohol use: Yes    Comment: Maybe 1 drink every 2 months   Drug use: No   Sexual activity: Not Currently    Birth control/protection: Post-menopausal  Other Topics Concern   Not on file  Social History Narrative   Lives with husband, Grace Barry   Patient is right handed.   Patient drinks 64 oz of diet soda daily.   Social Drivers of Corporate investment banker Strain: Low Risk  (02/26/2024)   Overall Financial Resource Strain (CARDIA)    Difficulty of Paying Living Expenses: Not hard at all  Food Insecurity: No Food Insecurity (02/26/2024)   Hunger Vital Sign    Worried About Running Out of Food in the Last Year: Never true    Ran Out of Food in the Last Year: Never true  Transportation Needs: No Transportation Needs (02/26/2024)   PRAPARE - Administrator, Civil Service (Medical): No    Lack of Transportation (Non-Medical): No  Physical Activity: Unknown (02/26/2024)   Exercise Vital Sign    Days of Exercise per Week: 0 days    Minutes of Exercise per Session: Not on file  Stress: No Stress Concern Present (02/26/2024)   [employer] of Occupational Health - Occupational Stress Questionnaire    Feeling of Stress : Only a little  Social Connections: Moderately Isolated (02/26/2024)   Social Connection and Isolation Panel [NHANES]     Frequency of Communication with Friends and Family: Twice a week    Frequency of Social Gatherings with Friends and Family: Twice a week    Attends Religious Services: Never    Database administrator or Organizations: No    Attends Engineer, structural: Not on file    Marital Status: Married  Catering manager Violence: Not on file    Review of Systems  Constitutional:  Positive for malaise/fatigue and weight loss. Negative for chills and fever.  Respiratory:  Negative for shortness of breath.   Cardiovascular:  Negative for chest pain, palpitations and leg swelling.  Gastrointestinal:  Negative for abdominal pain, constipation, diarrhea, nausea and vomiting.  Genitourinary:  Negative for dysuria, frequency and urgency.  Neurological:  Negative for dizziness, focal weakness and headaches.        Objective    BP 94/68   Pulse 91   Temp 97.7 F (36.5 C)   Ht 5' 2.4" (1.585 m)   Wt 160 lb 9.6 oz (72.8 kg)   SpO2 96%   BMI 29.00 kg/m   Physical Exam Constitutional:      General: She is not in acute distress.    Appearance: She is not ill-appearing.  HENT:     Mouth/Throat:     Mouth: Mucous membranes are moist.  Eyes:     Extraocular Movements: Extraocular movements intact.     Conjunctiva/sclera: Conjunctivae normal.  Cardiovascular:     Rate and Rhythm: Normal rate and regular rhythm.  Pulmonary:  Effort: Pulmonary effort is normal.     Breath sounds: Normal breath sounds.  Musculoskeletal:     Cervical back: Normal range of motion and neck supple.     Right lower leg: No edema.     Left lower leg: No edema.  Skin:    General: Skin is warm and dry.  Neurological:     General: No focal deficit present.     Mental Status: She is alert and oriented to person, place, and time.     Motor: No weakness.     Coordination: Coordination normal.     Gait: Gait normal.  Psychiatric:        Mood and Affect: Mood normal.        Behavior: Behavior normal.         Thought Content: Thought content normal.         Assessment & Plan:   Problem List Items Addressed This Visit     Chronic fatigue syndrome with fibromyalgia   Relevant Medications   DULoxetine  (CYMBALTA ) 60 MG capsule   Depression, major, recurrent, in remission (HCC)   Relevant Medications   DULoxetine  (CYMBALTA ) 60 MG capsule   Hypothyroidism - Primary   Relevant Orders   TSH (Completed)   T2_NIDDM w/CKD (HCC)   Relevant Orders   CBC with Differential/Platelet (Completed)   Comprehensive metabolic panel with GFR (Completed)   Hemoglobin A1c (Completed)   Other Visit Diagnoses       Vitamin D  deficiency       Relevant Orders   VITAMIN D  25 Hydroxy (Vit-D Deficiency, Fractures) (Completed)      She is a pleasant 73 year old female who is new to the practice and here to establish care. TSH significantly elevated.  She has been on her current dose of levothyroxine  125 mcg for 2 weeks. She has a history of chronic fatigue and fibromyalgia.  Takes nortriptyline  and duloxetine  and has been on these medications for years. She will continue her current medication regimen.  I will check labs including A1c, CBC, CMP and TSH.  History of vitamin D  deficiency and is currently taking a vitamin D  supplement.  Check vitamin D  level and follow-up.   Return for AWV with nurse soon. CPE with me in October 2025 .   Alyson Back, NP-C

## 2024-03-20 ENCOUNTER — Other Ambulatory Visit: Payer: Self-pay | Admitting: Family Medicine

## 2024-03-20 ENCOUNTER — Ambulatory Visit: Payer: Self-pay | Admitting: Family Medicine

## 2024-03-20 DIAGNOSIS — E039 Hypothyroidism, unspecified: Secondary | ICD-10-CM

## 2024-03-20 DIAGNOSIS — Z79899 Other long term (current) drug therapy: Secondary | ICD-10-CM

## 2024-03-30 DIAGNOSIS — E119 Type 2 diabetes mellitus without complications: Secondary | ICD-10-CM | POA: Diagnosis not present

## 2024-03-31 ENCOUNTER — Ambulatory Visit (INDEPENDENT_AMBULATORY_CARE_PROVIDER_SITE_OTHER)

## 2024-03-31 ENCOUNTER — Encounter: Payer: Self-pay | Admitting: Family Medicine

## 2024-03-31 DIAGNOSIS — J309 Allergic rhinitis, unspecified: Secondary | ICD-10-CM | POA: Diagnosis not present

## 2024-04-07 LAB — HM MAMMOGRAPHY

## 2024-04-08 ENCOUNTER — Ambulatory Visit

## 2024-04-28 ENCOUNTER — Other Ambulatory Visit (INDEPENDENT_AMBULATORY_CARE_PROVIDER_SITE_OTHER)

## 2024-04-28 ENCOUNTER — Ambulatory Visit (INDEPENDENT_AMBULATORY_CARE_PROVIDER_SITE_OTHER)

## 2024-04-28 ENCOUNTER — Ambulatory Visit: Payer: Self-pay | Admitting: Family Medicine

## 2024-04-28 DIAGNOSIS — E039 Hypothyroidism, unspecified: Secondary | ICD-10-CM

## 2024-04-28 DIAGNOSIS — Z79899 Other long term (current) drug therapy: Secondary | ICD-10-CM

## 2024-04-28 DIAGNOSIS — J309 Allergic rhinitis, unspecified: Secondary | ICD-10-CM | POA: Diagnosis not present

## 2024-04-28 LAB — TSH: TSH: 0.85 u[IU]/mL (ref 0.35–5.50)

## 2024-04-28 NOTE — Progress Notes (Signed)
 TSH is now in normal range. Ok to continue current dose and follow up with me in office in 4 weeks. Refill if needed please.

## 2024-04-29 DIAGNOSIS — E119 Type 2 diabetes mellitus without complications: Secondary | ICD-10-CM | POA: Diagnosis not present

## 2024-05-01 ENCOUNTER — Other Ambulatory Visit (HOSPITAL_COMMUNITY): Payer: Self-pay

## 2024-05-05 DIAGNOSIS — J3081 Allergic rhinitis due to animal (cat) (dog) hair and dander: Secondary | ICD-10-CM | POA: Diagnosis not present

## 2024-05-05 NOTE — Progress Notes (Signed)
 VIALS MADE 05-05-24

## 2024-05-06 ENCOUNTER — Ambulatory Visit: Payer: Medicare HMO | Admitting: Neurology

## 2024-05-06 DIAGNOSIS — J301 Allergic rhinitis due to pollen: Secondary | ICD-10-CM | POA: Diagnosis not present

## 2024-05-08 ENCOUNTER — Ambulatory Visit (INDEPENDENT_AMBULATORY_CARE_PROVIDER_SITE_OTHER): Admitting: Pulmonary Disease

## 2024-05-08 ENCOUNTER — Ambulatory Visit

## 2024-05-08 VITALS — BP 100/69 | HR 100 | Ht 62.4 in | Wt 155.2 lb

## 2024-05-08 VITALS — BP 97/65 | HR 82 | Ht 62.5 in | Wt 156.2 lb

## 2024-05-08 DIAGNOSIS — G4734 Idiopathic sleep related nonobstructive alveolar hypoventilation: Secondary | ICD-10-CM | POA: Diagnosis not present

## 2024-05-08 DIAGNOSIS — Z0000 Encounter for general adult medical examination without abnormal findings: Secondary | ICD-10-CM

## 2024-05-08 DIAGNOSIS — Z23 Encounter for immunization: Secondary | ICD-10-CM

## 2024-05-08 NOTE — Progress Notes (Signed)
 Subjective:   Grace Barry is a 73 y.o. who presents for a Medicare Wellness preventive visit.  As a reminder, Annual Wellness Visits don't include a physical exam, and some assessments may be limited, especially if this visit is performed virtually. We may recommend an in-person follow-up visit with your provider if needed.  Visit Complete: In person  Persons Participating in Visit: Patient.  AWV Questionnaire: Yes: Patient Medicare AWV questionnaire was completed by the patient on 05/07/2024; I have confirmed that all information answered by patient is correct and no changes since this date.  Cardiac Risk Factors include: advanced age (>41men, >72 women);hypertension;diabetes mellitus;dyslipidemia;Other (see comment), Risk factor comments: Fatty liver, OSA, Aortic atherosclerosis, CKD stage2     Objective:    Today's Vitals   05/08/24 1033  Weight: 155 lb 3.2 oz (70.4 kg)  Height: 5' 2.4 (1.585 m)   Body mass index is 28.02 kg/m.     05/08/2024   10:41 AM 05/07/2023   10:38 AM 01/01/2023    3:55 PM 12/22/2021    3:44 PM 12/22/2020    4:00 PM 10/19/2020    1:44 PM 12/15/2019    4:26 PM  Advanced Directives  Does Patient Have a Medical Advance Directive? Yes Yes Yes Yes Yes No Yes  Type of Estate agent of [place];Living will Healthcare Power of [place];Living will Living will Healthcare Power of [place];Living will Healthcare Power of [place];Living will  Healthcare Power of [place];Living will  Does patient want to make changes to medical advance directive?    No - Patient declined No - Patient declined  No - Patient declined  Copy of Healthcare Power of Attorney in Chart? No - copy requested No - copy requested  No - copy requested No - copy requested  No - copy requested    Current Medications (verified) Outpatient Encounter Medications as of 05/08/2024  Medication Sig   aspirin 81 MG tablet Take 81 mg by mouth daily.   Azelastine -Fluticasone   (DYMISTA ) 137-50 MCG/ACT SUSP Place 2 sprays into both nostrils 1 day or 1 dose.   baclofen  (LIORESAL ) 10 MG tablet TAKE 1 TABLET BY MOUTH TWICE A DAY AS NEEDED FOR MUSCLE SPASMS   Blood Glucose Monitoring Suppl DEVI Use glucometer to check blood sugars once a day   CALCIUM  PO Take 1,200 mg by mouth daily.   Cholecalciferol (VITAMIN D3) 125 MCG (5000 UT) CAPS Take  1 capsule Daily   Cyanocobalamin (VITAMIN B12 SL) Place 500 mcg under the tongue daily.   DULoxetine  (CYMBALTA ) 60 MG capsule TAKE 1 CAPSULE DAILY FOR CHRONIC PAIN SYNDROME.   EPINEPHrine  0.3 mg/0.3 mL IJ SOAJ injection Inject 0.3 mg into the muscle as needed.   famotidine  (PEPCID ) 40 MG tablet Take  1 tablet    2 x / day (every 12 hours)  to Prevent Heart burn & Indigestion   glucose blood (ONE TOUCH ULTRA TEST) test strip Check Blood sugar Daily for diabetes   Lancets (ONETOUCH ULTRASOFT) lancets Check Blood sugar  daily   levothyroxine  (SYNTHROID ) 125 MCG tablet Take 1 tablet (125 mcg total) by mouth daily.   loperamide  (IMODIUM ) 2 MG capsule Take 1 capsule (2 mg total) by mouth as needed for diarrhea or loose stools.   losartan  (COZAAR ) 100 MG tablet TAKE 1 TABLET BY MOUTH DAILY FOR BLOOD PRESSURE AND KIDNEY PROTECTION   Magnesium 250 MG TABS Take 250 mg by mouth 2 (two) times daily.   metFORMIN  (GLUCOPHAGE -XR) 500 MG 24 hr tablet TAKE  2 TABLETS TWICE DAILY WITH MEALS FOR DIABETES   metoprolol  succinate (TOPROL -XL) 50 MG 24 hr tablet Take 1 tablet (50 mg total) by mouth daily. Take with or immediately following a meal.   montelukast  (SINGULAIR ) 10 MG tablet Take 1 tablet (10 mg total) by mouth at bedtime.   Multiple Vitamins-Minerals (MULTIVITAMIN PO) Take 1 tablet by mouth daily.    nortriptyline  (PAMELOR ) 10 MG capsule TAKE 1 CAPSULE BY MOUTH EVERYDAY AT BEDTIME   Omega-3 Fatty Acids (FISH OIL PO) Take 600 mg by mouth daily.   omeprazole  (PRILOSEC) 20 MG capsule TAKE 1 CAPSULE DAILY TO PREVENT HEARTBURN & ACID REFLUX    PRESCRIPTION MEDICATION Inject as directed every 30 (thirty) days. Allergy Shot   rosuvastatin  (CRESTOR ) 10 MG tablet TAKE 1 TABLET BY MOUTH EVERY DAY FOR CHOLESTEROL   tirzepatide  (MOUNJARO ) 15 MG/0.5ML Pen Inject 15 mg into the skin every 7 (seven) days for diabetes.   Turmeric (QC TUMERIC COMPLEX PO) Take 1 tablet by mouth daily.   No facility-administered encounter medications on file as of 05/08/2024.    Allergies (verified) Floxin [ofloxacin], Invokana  [canagliflozin ], and Viberzi  [eluxadoline ]   History: Past Medical History:  Diagnosis Date   Allergy    Anxiety    Arthritis    Cataract    Chronic kidney disease (CKD), stage II (mild) 07/01/2014   Overview:  Overview:  Due to DM, last GFR 64    Degenerative tear of triangular fibrocartilage complex (TFCC) of left wrist 01/17/2019   Depression    Fibromyalgia    GERD (gastroesophageal reflux disease)    Hyperlipidemia    Hypertension    Hypothyroidism    Hypoxemia    IBS (irritable bowel syndrome)    Liver disease    Migraine 02/19/2015   Migraines    Obesity    Osteoporosis    Oxygen  deficiency    wears oxygen  at [facility]- 2 liters   Pneumonia    Sensorineural hearing loss (SNHL) of left ear with unrestricted hearing of right ear 12/31/2017   Sleep apnea    uses oral device none   Sleep difficulties    Thyroid  disease    hypothyroidism   Tuberculosis    positive PPD test, chest x ray negative   Type II or unspecified type diabetes mellitus without mention of complication, not stated as uncontrolled    Urticaria    Ventral hernia    Vertigo 02/19/2015   Past Surgical History:  Procedure Laterality Date   ABDOMINAL HYSTERECTOMY     ADENOIDECTOMY     APPENDECTOMY     CARPAL TUNNEL RELEASE     bilateral   CATARACT EXTRACTION, BILATERAL     CESAREAN SECTION     x2   CHOLECYSTECTOMY     COLONOSCOPY     COLONOSCOPY WITH PROPOFOL  N/A 05/07/2023   Procedure: COLONOSCOPY WITH PROPOFOL ;  Surgeon: Shila Gustav GAILS, MD;  Location: [facility];  Service: Gastroenterology;  Laterality: N/A;   HERNIA REPAIR  07/24/2011   ventral hernia   JOINT REPLACEMENT     bilateral knees   KNEE ARTHROSCOPY     bilateral   POLYPECTOMY  05/07/2023   Procedure: POLYPECTOMY;  Surgeon: Shila Gustav GAILS, MD;  Location: [facility];  Service: Gastroenterology;;   SINOSCOPY     TONSILLECTOMY     TOTAL KNEE ARTHROPLASTY     bilateral   TUBAL LIGATION     Family History  Problem Relation Age of Onset   Breast cancer Mother  Arthritis Mother    Cancer Mother    Vision loss Mother    Hypertension Father    Hyperlipidemia Father    Prostate cancer Father    Cancer Father    Hearing loss Father    Hyperlipidemia Sister    Cancer Son    Stomach cancer Son    Esophageal cancer Son    Colon cancer Son    Asthma Maternal Grandfather    Stroke Maternal Grandfather    Vision loss Maternal Grandmother    Colon polyps Neg Hx    Diabetes Neg Hx    Kidney disease Neg Hx    Migraines Neg Hx    Rectal cancer Neg Hx    Social History   Socioeconomic History   Marital status: Married    Spouse name: Geographical information systems officer   Number of children: 2   Years of education: masters   Highest education level: Master's degree (e.g., MA, MS, MEng, MEd, MSW, MBA)  Occupational History   Occupation: Retired Runner, broadcasting/film/video  Tobacco Use   Smoking status: Never   Smokeless tobacco: Never  Vaping Use   Vaping status: Never Used  Substance and Sexual Activity   Alcohol use: Yes    Comment: Maybe 1 drink every 2 months   Drug use: No   Sexual activity: Not Currently    Birth control/protection: Post-menopausal  Other Topics Concern   Not on file  Social History Narrative   Lives with husband, Roger/2025 and 1 dog   Patient is right handed.   Patient drinks 64 oz of diet soda daily.   Social Drivers of Corporate investment banker Strain: Low Risk  (05/07/2024)   Overall Financial Resource Strain (CARDIA)    Difficulty of Paying Living  Expenses: Not hard at all  Food Insecurity: No Food Insecurity (05/07/2024)   Hunger Vital Sign    Worried About Running Out of Food in the Last Year: Never true    Ran Out of Food in the Last Year: Never true  Transportation Needs: No Transportation Needs (05/07/2024)   PRAPARE - Administrator, Civil Service (Medical): No    Lack of Transportation (Non-Medical): No  Physical Activity: Insufficiently Active (05/07/2024)   Exercise Vital Sign    Days of Exercise per Week: 2 days    Minutes of Exercise per Session: 20 min  Stress: No Stress Concern Present (05/07/2024)   [employer] of Occupational Health - Occupational Stress Questionnaire    Feeling of Stress: Only a little  Social Connections: Moderately Isolated (05/07/2024)   Social Connection and Isolation Panel    Frequency of Communication with Friends and Family: Once a week    Frequency of Social Gatherings with Friends and Family: Twice a week    Attends Religious Services: Never    Database administrator or Organizations: No    Attends Engineer, structural: Not on file    Marital Status: Married    Tobacco Counseling Counseling given: Not Answered    Clinical Intake:  Pre-visit preparation completed: Yes  Pain : No/denies pain     BMI - recorded: 28.02 Nutritional Status: BMI 25 -29 Overweight Nutritional Risks: None Diabetes: Yes CBG done?: No (last check on Monday 89-fasting per pt) Did pt. bring in CBG monitor from home?: No  Lab Results  Component Value Date   HGBA1C 5.7 03/19/2024   HGBA1C 5.6 11/06/2023   HGBA1C 5.8 (H) 07/18/2023     How often do you need  to have someone help you when you read instructions, pamphlets, or other written materials from your doctor or pharmacy?: 1 - Never  Interpreter Needed?: No  Information entered by :: Kienna Moncada, RMA   Activities of Daily Living     05/07/2024   12:42 PM  In your present state of health, do you have any difficulty  performing the following activities:  Hearing? 0  Vision? 0  Difficulty concentrating or making decisions? 0  Walking or climbing stairs? 0  Dressing or bathing? 0  Doing errands, shopping? 0  Preparing Food and eating ? N  Using the Toilet? N  In the past six months, have you accidently leaked urine? N  Do you have problems with loss of bowel control? N  Managing your Medications? N  Managing your Finances? N  Housekeeping or managing your Housekeeping? N    Patient Care Team: Lendia Boby CROME, NP-C as PCP - General (Family Medicine) Trudy Righter, DC as Anesthesiologist (Chiropractic Medicine)  I have updated your Care Teams any recent Medical Services you may have received from other providers in the past year.     Assessment:   This is a routine wellness examination for [facility].  Hearing/Vision screen Hearing Screening - Comments:: Wears a hearing aide in lt ear Vision Screening - Comments:: Waers eyeglasses/ Dr. Othelia   Goals Addressed               This Visit's Progress     Exercise 3 x per week (10 min per time) (pt-stated)   On track     Per pt 2 times a week for 20 min       Depression Screen     05/08/2024   10:42 AM 03/19/2024   10:47 AM 01/01/2023    3:56 PM 12/22/2021    3:51 PM 12/22/2020    4:21 PM 12/22/2020    4:03 PM 12/15/2019    4:54 PM  PHQ 2/9 Scores  PHQ - 2 Score 0 1 0 0 0 0 1  PHQ- 9 Score 6 4   3  4     Fall Risk     05/07/2024   12:42 PM 03/19/2024   10:47 AM 01/01/2023    3:56 PM 12/22/2021    3:50 PM 12/22/2020    4:03 PM  Fall Risk   Falls in the past year? 0 0 0 0 0  Number falls in past yr:  0  0 0  Injury with Fall? 0 0  0 0  Risk for fall due to :  No Fall Risks  No Fall Risks No Fall Risks  Follow up Falls evaluation completed;Falls prevention discussed Falls evaluation completed  Falls prevention discussed;Falls evaluation completed  Falls evaluation completed;Falls prevention discussed      Data saved with a previous  flowsheet row definition    MEDICARE RISK AT HOME:  Medicare Risk at Home Any stairs in or around the home?: (Patient-Rptd) Yes If so, are there any without handrails?: (Patient-Rptd) No Home free of loose throw rugs in walkways, pet beds, electrical cords, etc?: (Patient-Rptd) Yes Adequate lighting in your home to reduce risk of falls?: (Patient-Rptd) Yes Life alert?: (Patient-Rptd) No Use of a cane, walker or w/c?: (Patient-Rptd) No Grab bars in the bathroom?: (Patient-Rptd) No Shower chair or bench in shower?: (Patient-Rptd) No Elevated toilet seat or a handicapped toilet?: (Patient-Rptd) No  TIMED UP AND GO:  Was the test performed?  Yes  Length of time to ambulate 10  feet: 15 sec Gait slow and steady without use of assistive device  Cognitive Function: Declined/Normal: No cognitive concerns noted by patient or family. Patient alert, oriented, able to answer questions appropriately and recall recent events. No signs of memory loss or confusion.        Immunizations Immunization History  Administered Date(s) Administered   Influenza, High Dose Seasonal PF 08/10/2015, 08/10/2016, 08/28/2017, 11/07/2018, 08/07/2019, 09/21/2020, 07/25/2021, 09/26/2022, 07/18/2023   Influenza-Unspecified 08/05/2019   PFIZER(Purple Top)SARS-COV-2 Vaccination 11/19/2019, 12/10/2019, 08/05/2020   Pneumococcal Conjugate-13 07/01/2014   Pneumococcal Polysaccharide-23 10/09/2002, 05/26/2008, 12/13/2016   Td 10/09/2002, 10/30/2010   Tdap 08/10/2015   Zoster Recombinant(Shingrix) 07/25/2021   Zoster, Live 06/20/2011    Screening Tests Health Maintenance  Topic Date Due   Zoster Vaccines- Shingrix (2 of 2) 09/19/2021   FOOT EXAM  12/22/2022   COVID-19 Vaccine (4 - 2024-25 season) 07/01/2023   Medicare Annual Wellness (AWV)  01/01/2024   OPHTHALMOLOGY EXAM  05/13/2024   INFLUENZA VACCINE  05/30/2024   HEMOGLOBIN A1C  09/19/2024   Diabetic kidney evaluation - Urine ACR  11/05/2024   Diabetic  kidney evaluation - eGFR measurement  03/19/2025   MAMMOGRAM  04/07/2025   DEXA SCAN  04/22/2025   DTaP/Tdap/Td (4 - Td or Tdap) 08/09/2025   Colonoscopy  05/06/2026   Pneumococcal Vaccine: 50+ Years  Completed   Hepatitis C Screening  Completed   Hepatitis B Vaccines  Aged Out   HPV VACCINES  Aged Out   Meningococcal B Vaccine  Aged Out    Health Maintenance  Health Maintenance Due  Topic Date Due   Zoster Vaccines- Shingrix (2 of 2) 09/19/2021   FOOT EXAM  12/22/2022   COVID-19 Vaccine (4 - 2024-25 season) 07/01/2023   Medicare Annual Wellness (AWV)  01/01/2024   Health Maintenance Items Addressed: Diabetic Foot Exam recommended, See Nurse Notes at the end of this note  Additional Screening:  Vision Screening: Recommended annual ophthalmology exams for early detection of glaucoma and other disorders of the eye. Would you like a referral to an eye doctor? No    Dental Screening: Recommended annual dental exams for proper oral hygiene  Community Resource Referral / Chronic Care Management: CRR required this visit?  No   CCM required this visit?  No   Plan:    I have personally reviewed and noted the following in the patient's chart:   Medical and social history Use of alcohol, tobacco or illicit drugs  Current medications and supplements including opioid prescriptions. Patient is not currently taking opioid prescriptions. Functional ability and status Nutritional status Physical activity Advanced directives List of other physicians Hospitalizations, surgeries, and ER visits in previous 12 months Vitals Screenings to include cognitive, depression, and falls Referrals and appointments  In addition, I have reviewed and discussed with patient certain preventive protocols, quality metrics, and best practice recommendations. A written personalized care plan for preventive services as well as general preventive health recommendations were provided to  patient.   Layan Zalenski L Brianny Soulliere, CMA   05/08/2024   After Visit Summary: (MyChart) Due to this being a telephonic visit, the after visit summary with patients personalized plan was offered to patient via MyChart   Notes: Patient is due for a foot exam and can get that done during her up coming office visit.  Patient is up to date on all other health maintenance with no concerns to address today.  Patient stated that she has had her Shingrix vaccine ([facility]), however, it has not been  documented in [place].

## 2024-05-08 NOTE — Patient Instructions (Signed)
 Ms. Grace Barry , Thank you for taking time out of your busy schedule to complete your Annual Wellness Visit with me. I enjoyed our conversation and look forward to speaking with you again next year. I, as well as your care team,  appreciate your ongoing commitment to your health goals. Please review the following plan we discussed and let me know if I can assist you in the future. Your Game plan/ To Do List     Follow up Visits: Next Medicare AWV with our clinical staff: 05/12/2025.   Have you seen your provider in the last 6 months (3 months if uncontrolled diabetes)? Yes Next Office Visit with your provider: 05/23/2024.  Clinician Recommendations:  Aim for 30 minutes of exercise or brisk walking, 6-8 glasses of water, and 5 servings of fruits and vegetables each day. Keep up the good work.      This is a list of the screening recommended for you and due dates:  Health Maintenance  Topic Date Due   Zoster (Shingles) Vaccine (2 of 2) 09/19/2021   Complete foot exam   12/22/2022   COVID-19 Vaccine (4 - 2024-25 season) 07/01/2023   Medicare Annual Wellness Visit  01/01/2024   Eye exam for diabetics  05/13/2024   Flu Shot  05/30/2024   Hemoglobin A1C  09/19/2024   Yearly kidney health urinalysis for diabetes  11/05/2024   Yearly kidney function blood test for diabetes  03/19/2025   Mammogram  04/07/2025   DEXA scan (bone density measurement)  04/22/2025   DTaP/Tdap/Td vaccine (4 - Td or Tdap) 08/09/2025   Colon Cancer Screening  05/06/2026   Pneumococcal Vaccine for age over 27  Completed   Hepatitis C Screening  Completed   Hepatitis B Vaccine  Aged Out   HPV Vaccine  Aged Out   Meningitis B Vaccine  Aged Out    Advanced directives: (Copy Requested) Please bring a copy of your health care power of attorney and living will to the office to be added to your chart at your convenience. You can mail to [facility] [address] [place], [place] [address] or email to  [email or web address] Advance Care Planning is important because it:  [x]  Makes sure you receive the medical care that is consistent with your values, goals, and preferences  [x]  It provides guidance to your family and loved ones and reduces their decisional burden about whether or not they are making the right decisions based on your wishes.  Follow the link provided in your after visit summary or read over the paperwork we have mailed to you to help you started getting your Advance Directives in place. If you need assistance in completing these, please reach out to us  so that we can help you!  See attachments for Preventive Care and Fall Prevention Tips.

## 2024-05-08 NOTE — Progress Notes (Signed)
 ERA PARR    [id number]    [birth year]  Primary Care Physician:Henson, Boby CROME, NP-C  Referring Physician: Tonita Fallow, MD [address] [place],  [place] [address]  Chief complaint:   Patient being seen for nocturnal hypoxemia  HPI:  In the evaluation for sleep disordered breathing, was found to have a negative sleep study but did have significant desaturations for which she has been on 2 L of oxygen   Recent sleep study December 2024 did show adequate saturations with 2 L of oxygen   Since then she is lost over 50 pounds  Has been using oxygen  now for about 3 years  Sleep quality is good Goes to bed about 1130, wakes up about twice Final wake up time about 10 AM Was following up with Dr. Chalice  Never smoker  Does not have a history of lung disease No anemia  No pertinent occupational history  Outpatient Encounter Medications as of 05/08/2024  Medication Sig   aspirin 81 MG tablet Take 81 mg by mouth daily.   Azelastine -Fluticasone  (DYMISTA ) 137-50 MCG/ACT SUSP Place 2 sprays into both nostrils 1 day or 1 dose.   baclofen  (LIORESAL ) 10 MG tablet TAKE 1 TABLET BY MOUTH TWICE A DAY AS NEEDED FOR MUSCLE SPASMS   Blood Glucose Monitoring Suppl DEVI Use glucometer to check blood sugars once a day   CALCIUM  PO Take 1,200 mg by mouth daily.   Cholecalciferol (VITAMIN D3) 125 MCG (5000 UT) CAPS Take  1 capsule Daily   Cyanocobalamin (VITAMIN B12 SL) Place 500 mcg under the tongue daily.   DULoxetine  (CYMBALTA ) 60 MG capsule TAKE 1 CAPSULE DAILY FOR CHRONIC PAIN SYNDROME.   EPINEPHrine  0.3 mg/0.3 mL IJ SOAJ injection Inject 0.3 mg into the muscle as needed.   famotidine  (PEPCID ) 40 MG tablet Take  1 tablet    2 x / day (every 12 hours)  to Prevent Heart burn & Indigestion   glucose blood (ONE TOUCH ULTRA TEST) test strip Check Blood sugar Daily for diabetes   Lancets (ONETOUCH ULTRASOFT) lancets Check Blood sugar  daily   levothyroxine   (SYNTHROID ) 125 MCG tablet Take 1 tablet (125 mcg total) by mouth daily.   loperamide  (IMODIUM ) 2 MG capsule Take 1 capsule (2 mg total) by mouth as needed for diarrhea or loose stools.   losartan  (COZAAR ) 100 MG tablet TAKE 1 TABLET BY MOUTH DAILY FOR BLOOD PRESSURE AND KIDNEY PROTECTION   Magnesium 250 MG TABS Take 250 mg by mouth 2 (two) times daily.   metFORMIN  (GLUCOPHAGE -XR) 500 MG 24 hr tablet TAKE 2 TABLETS TWICE DAILY WITH MEALS FOR DIABETES   metoprolol  succinate (TOPROL -XL) 50 MG 24 hr tablet Take 1 tablet (50 mg total) by mouth daily. Take with or immediately following a meal.   montelukast  (SINGULAIR ) 10 MG tablet Take 1 tablet (10 mg total) by mouth at bedtime.   Multiple Vitamins-Minerals (MULTIVITAMIN PO) Take 1 tablet by mouth daily.    nortriptyline  (PAMELOR ) 10 MG capsule TAKE 1 CAPSULE BY MOUTH EVERYDAY AT BEDTIME   Omega-3 Fatty Acids (FISH OIL PO) Take 600 mg by mouth daily.   omeprazole  (PRILOSEC) 20 MG capsule TAKE 1 CAPSULE DAILY TO PREVENT HEARTBURN & ACID REFLUX   PRESCRIPTION MEDICATION Inject as directed every 30 (thirty) days. Allergy Shot   rosuvastatin  (CRESTOR ) 10 MG tablet TAKE 1 TABLET BY MOUTH EVERY DAY FOR CHOLESTEROL   tirzepatide  (MOUNJARO ) 15 MG/0.5ML Pen Inject 15 mg into the skin  every 7 (seven) days for diabetes.   Turmeric (QC TUMERIC COMPLEX PO) Take 1 tablet by mouth daily.   No facility-administered encounter medications on file as of 05/08/2024.    Allergies as of 05/08/2024 - Review Complete 05/08/2024  Allergen Reaction Noted   Floxin [ofloxacin] Hives 06/12/2011   Invokana  [canagliflozin ]  02/14/2020   Viberzi  [eluxadoline ] Hives 07/22/2015    Past Medical History:  Diagnosis Date   Allergy    Anxiety    Arthritis    Cataract    Chronic kidney disease (CKD), stage II (mild) 07/01/2014   Overview:  Overview:  Due to DM, last GFR 64    Degenerative tear of triangular fibrocartilage complex (TFCC) of left wrist 01/17/2019   Depression     Fibromyalgia    GERD (gastroesophageal reflux disease)    Hyperlipidemia    Hypertension    Hypothyroidism    Hypoxemia    IBS (irritable bowel syndrome)    Liver disease    Migraine 02/19/2015   Migraines    Obesity    Osteoporosis    Oxygen  deficiency    wears oxygen  at [facility]- 2 liters   Pneumonia    Sensorineural hearing loss (SNHL) of left ear with unrestricted hearing of right ear 12/31/2017   Sleep apnea    uses oral device none   Sleep difficulties    Thyroid  disease    hypothyroidism   Tuberculosis    positive PPD test, chest x ray negative   Type II or unspecified type diabetes mellitus without mention of complication, not stated as uncontrolled    Urticaria    Ventral hernia    Vertigo 02/19/2015    Past Surgical History:  Procedure Laterality Date   ABDOMINAL HYSTERECTOMY     ADENOIDECTOMY     APPENDECTOMY     CARPAL TUNNEL RELEASE     bilateral   CATARACT EXTRACTION, BILATERAL     CESAREAN SECTION     x2   CHOLECYSTECTOMY     COLONOSCOPY     COLONOSCOPY WITH PROPOFOL  N/A 05/07/2023   Procedure: COLONOSCOPY WITH PROPOFOL ;  Surgeon: Shila Gustav GAILS, MD;  Location: [facility];  Service: Gastroenterology;  Laterality: N/A;   HERNIA REPAIR  07/24/2011   ventral hernia   JOINT REPLACEMENT     bilateral knees   KNEE ARTHROSCOPY     bilateral   POLYPECTOMY  05/07/2023   Procedure: POLYPECTOMY;  Surgeon: Shila Gustav GAILS, MD;  Location: [facility];  Service: Gastroenterology;;   SINOSCOPY     TONSILLECTOMY     TOTAL KNEE ARTHROPLASTY     bilateral   TUBAL LIGATION      Family History  Problem Relation Age of Onset   Breast cancer Mother    Arthritis Mother    Cancer Mother    Vision loss Mother    Hypertension Father    Hyperlipidemia Father    Prostate cancer Father    Cancer Father    Hearing loss Father    Hyperlipidemia Sister    Cancer Son    Stomach cancer Son    Esophageal cancer Son    Colon cancer Son    Asthma Maternal  Grandfather    Stroke Maternal Grandfather    Vision loss Maternal Grandmother    Colon polyps Neg Hx    Diabetes Neg Hx    Kidney disease Neg Hx    Migraines Neg Hx    Rectal cancer Neg Hx     Social History  Socioeconomic History   Marital status: Married    Spouse name: Sharolyn   Number of children: 2   Years of education: masters   Highest education level: Master's degree (e.g., MA, MS, MEng, MEd, MSW, MBA)  Occupational History   Occupation: Retired Runner, broadcasting/film/video  Tobacco Use   Smoking status: Never   Smokeless tobacco: Never  Vaping Use   Vaping status: Never Used  Substance and Sexual Activity   Alcohol use: Yes    Comment: Maybe 1 drink every 2 months   Drug use: No   Sexual activity: Not Currently    Birth control/protection: Post-menopausal  Other Topics Concern   Not on file  Social History Narrative   Lives with husband, Roger/2025 and 1 dog   Patient is right handed.   Patient drinks 64 oz of diet soda daily.   Social Drivers of Corporate investment banker Strain: Low Risk  (05/07/2024)   Overall Financial Resource Strain (CARDIA)    Difficulty of Paying Living Expenses: Not hard at all  Food Insecurity: No Food Insecurity (05/07/2024)   Hunger Vital Sign    Worried About Running Out of Food in the Last Year: Never true    Ran Out of Food in the Last Year: Never true  Transportation Needs: No Transportation Needs (05/07/2024)   PRAPARE - Administrator, Civil Service (Medical): No    Lack of Transportation (Non-Medical): No  Physical Activity: Insufficiently Active (05/07/2024)   Exercise Vital Sign    Days of Exercise per Week: 2 days    Minutes of Exercise per Session: 20 min  Stress: No Stress Concern Present (05/07/2024)   [employer] of Occupational Health - Occupational Stress Questionnaire    Feeling of Stress: Only a little  Social Connections: Moderately Isolated (05/07/2024)   Social Connection and Isolation Panel    Frequency of  Communication with Friends and Family: Once a week    Frequency of Social Gatherings with Friends and Family: Twice a week    Attends Religious Services: Never    Database administrator or Organizations: No    Attends Engineer, structural: Not on file    Marital Status: Married  Catering manager Violence: Not At Risk (05/08/2024)   Humiliation, Afraid, Rape, and Kick questionnaire    Fear of Current or Ex-Partner: No    Emotionally Abused: No    Physically Abused: No    Sexually Abused: No    Review of Systems  Respiratory:  Positive for shortness of breath. Negative for apnea.     There were no vitals filed for this visit.   Physical Exam Constitutional:      Appearance: Normal appearance.  HENT:     Head: Normocephalic.     Mouth/Throat:     Mouth: Mucous membranes are moist.  Eyes:     General: No scleral icterus. Cardiovascular:     Rate and Rhythm: Normal rate and regular rhythm.     Heart sounds: No murmur heard.    No friction rub.  Pulmonary:     Effort: No respiratory distress.     Breath sounds: No stridor. No wheezing or rhonchi.  Musculoskeletal:     Cervical back: No rigidity or tenderness.  Neurological:     Mental Status: She is alert.  Psychiatric:        Mood and Affect: Mood normal.      Data Reviewed: Sleep study 10/15/2023 reviewed showing absence of sleep disordered breathing,  no significant desaturations on 2 L of oxygen   Echocardiogram 10/09/2023 with right ventricular systolic pressures of 32.8 otherwise normal  Pulmonary function test 12/06/2020 revealed a normal pulmonary function test, reviewed by myself  Assessment:  Nocturnal hypoxemia - This may have been related to obesity, has lost over 80 pounds in total over the last few years Over 50 pound weight loss recently, currently on Mounjaro   Has no history of anemia, no pulmonary hypertension, no underlying significant lung disease  History of asthma -  Controlled  History of allergies  Plan/Recommendations: I will see you in about 6 months  Give us  a call if you do not hear from us  soon after having the study done  Continue weight loss efforts  Continue using oxygen  at present  Overnight oximetry should be performed without oxygen  supplementation   Jennet Epley MD [facility] 05/08/2024, 12:56 PM  CC: Tonita Fallow, MD

## 2024-05-08 NOTE — Patient Instructions (Signed)
 Obtain overnight oximetry off oxygen -this should be done on room air  Tentative follow-up in 6 months  Call us  with significant concerns  If you do not hear from us  after you have had the study done give us  a call in about a week or so afterwards

## 2024-05-13 ENCOUNTER — Encounter: Payer: Self-pay | Admitting: Family Medicine

## 2024-05-16 ENCOUNTER — Encounter: Payer: Self-pay | Admitting: Pulmonary Disease

## 2024-05-19 NOTE — Telephone Encounter (Signed)
 ONO order was sent to [facility] on 05/08/24 and they are the ones that will schedule for the patient. I have sent urgent message to [facility] about this issue

## 2024-05-19 NOTE — Telephone Encounter (Signed)
 ONO ordered 05/08/24.   PCC's - can someone contact her re: scheduling? Thanks!

## 2024-05-20 DIAGNOSIS — G4730 Sleep apnea, unspecified: Secondary | ICD-10-CM | POA: Diagnosis not present

## 2024-05-20 DIAGNOSIS — R0902 Hypoxemia: Secondary | ICD-10-CM | POA: Diagnosis not present

## 2024-05-22 ENCOUNTER — Ambulatory Visit: Admitting: Family Medicine

## 2024-05-26 ENCOUNTER — Encounter: Payer: Self-pay | Admitting: Pulmonary Disease

## 2024-05-28 ENCOUNTER — Encounter: Payer: Self-pay | Admitting: Family Medicine

## 2024-05-28 ENCOUNTER — Encounter: Payer: Self-pay | Admitting: Pulmonary Disease

## 2024-05-28 ENCOUNTER — Ambulatory Visit (INDEPENDENT_AMBULATORY_CARE_PROVIDER_SITE_OTHER): Admitting: Family Medicine

## 2024-05-28 ENCOUNTER — Ambulatory Visit (INDEPENDENT_AMBULATORY_CARE_PROVIDER_SITE_OTHER)

## 2024-05-28 ENCOUNTER — Ambulatory Visit: Payer: Self-pay | Admitting: Family Medicine

## 2024-05-28 VITALS — BP 108/70 | HR 94 | Temp 97.9°F | Ht 62.5 in | Wt 152.0 lb

## 2024-05-28 DIAGNOSIS — I700 Atherosclerosis of aorta: Secondary | ICD-10-CM | POA: Diagnosis not present

## 2024-05-28 DIAGNOSIS — I10 Essential (primary) hypertension: Secondary | ICD-10-CM | POA: Diagnosis not present

## 2024-05-28 DIAGNOSIS — J309 Allergic rhinitis, unspecified: Secondary | ICD-10-CM | POA: Diagnosis not present

## 2024-05-28 DIAGNOSIS — R42 Dizziness and giddiness: Secondary | ICD-10-CM | POA: Diagnosis not present

## 2024-05-28 DIAGNOSIS — E039 Hypothyroidism, unspecified: Secondary | ICD-10-CM

## 2024-05-28 DIAGNOSIS — R432 Parageusia: Secondary | ICD-10-CM | POA: Insufficient documentation

## 2024-05-28 LAB — BASIC METABOLIC PANEL WITH GFR
BUN: 5 mg/dL — ABNORMAL LOW (ref 6–23)
CO2: 28 meq/L (ref 19–32)
Calcium: 9.6 mg/dL (ref 8.4–10.5)
Chloride: 99 meq/L (ref 96–112)
Creatinine, Ser: 0.62 mg/dL (ref 0.40–1.20)
GFR: 87.62 mL/min (ref >60.00)
Glucose, Bld: 84 mg/dL (ref 70–99)
Potassium: 4 meq/L (ref 3.5–5.1)
Sodium: 135 meq/L (ref 135–145)

## 2024-05-28 LAB — TSH: TSH: 0.38 u[IU]/mL (ref 0.35–5.50)

## 2024-05-28 MED ORDER — LOSARTAN POTASSIUM 50 MG PO TABS
50.0000 mg | ORAL_TABLET | Freq: Every day | ORAL | 1 refills | Status: AC
Start: 2024-05-28 — End: ?

## 2024-05-28 NOTE — Progress Notes (Signed)
 Subjective:     Patient ID: Grace Barry, female    DOB: [birth year], 73 y.o.   MRN: [id number]  Chief Complaint  Patient presents with   Medical Management of Chronic Issues    4 week f/u, was wondering if Grace Barry could decrease BP meds    HPI  Discussed the use of AI scribe software for clinical note transcription with the patient, who gave verbal consent to proceed.  History of Present Illness Grace Barry is a 73 year old female with hypothyroidism and hypertension who presents for follow-up.  Thyroid  dysfunction - On levothyroxine  125 mcg daily for approximately six weeks - TSH improved from 20 to 0.85 after starting current dose - Takes levothyroxine  on an empty stomach daily - No use of biotin supplements  Hypertension and orthostatic symptoms - On losartan  100 mg daily and metoprolol  succinate 50 mg daily - Home blood pressure readings consistently lower than 110/60-70 mmHg, with occasional systolic readings below 100 mmHg - Lightheadedness when standing too quickly - No dizziness, falls, chest pain, palpitations, shortness of breath, vomiting, or diarrhea - Desires reduction in antihypertensive medication  Weight loss and diabetes management - On Mounjaro  15 mg weekly - Weight loss of over 80 pounds - A1c was 5.7% as of Mar 19, 2024 - History of diabetes  Dysgeusia and food aversion - Recent aversion to the smell of certain foods, including Skinny Pop and barbecue, which were previously enjoyed - Has not tasted these foods since developing the aversion  Hyperlipidemia - On Crestor  10 mg daily for statin therapy  Vitamin d  status - Vitamin D  levels within normal range  Other providers:  Cardiologist- Dr. Kate  Orthopedist  Allergist- Iva Neurologist- Dr. Felicita  Pulmonologist- Dr. Neda    Uses oxygen  at night.  Hx of OSA and lost a significant amount of weight and no longer an issue.    Lost 80+ lbs. Her goal is to get to 140 lbs.  Grace Barry  is on Mounjaro     Health Maintenance Due  Topic Date Due   FOOT EXAM  12/22/2022   COVID-19 Vaccine (5 - 2024-25 season) 07/01/2023   OPHTHALMOLOGY EXAM  05/13/2024    Past Medical History:  Diagnosis Date   Allergy    Anxiety    Arthritis    Cataract    Chronic kidney disease (CKD), stage II (mild) 07/01/2014   Overview:  Overview:  Due to DM, last GFR 64    Degenerative tear of triangular fibrocartilage complex (TFCC) of left wrist 01/17/2019   Depression    Fibromyalgia    GERD (gastroesophageal reflux disease)    Hyperlipidemia    Hypertension    Hypothyroidism    Hypoxemia    IBS (irritable bowel syndrome)    Liver disease    Migraine 02/19/2015   Migraines    Obesity    Osteoporosis    Oxygen  deficiency    wears oxygen  at [facility]- 2 liters   Pneumonia    Sensorineural hearing loss (SNHL) of left ear with unrestricted hearing of right ear 12/31/2017   Sleep apnea    uses oral device none   Sleep difficulties    Thyroid  disease    hypothyroidism   Tuberculosis    positive PPD test, chest x ray negative   Type II or unspecified type diabetes mellitus without mention of complication, not stated as uncontrolled    Urticaria    Ventral hernia    Vertigo 02/19/2015    Past  Surgical History:  Procedure Laterality Date   ABDOMINAL HYSTERECTOMY     ADENOIDECTOMY     APPENDECTOMY     CARPAL TUNNEL RELEASE     bilateral   CATARACT EXTRACTION, BILATERAL     CESAREAN SECTION     x2   CHOLECYSTECTOMY     COLONOSCOPY     COLONOSCOPY WITH PROPOFOL  N/A 05/07/2023   Procedure: COLONOSCOPY WITH PROPOFOL ;  Surgeon: Shila Gustav GAILS, MD;  Location: [facility];  Service: Gastroenterology;  Laterality: N/A;   HERNIA REPAIR  07/24/2011   ventral hernia   JOINT REPLACEMENT     bilateral knees   KNEE ARTHROSCOPY     bilateral   POLYPECTOMY  05/07/2023   Procedure: POLYPECTOMY;  Surgeon: Shila Gustav GAILS, MD;  Location: [facility];  Service: Gastroenterology;;    SINOSCOPY     TONSILLECTOMY     TOTAL KNEE ARTHROPLASTY     bilateral   TUBAL LIGATION      Family History  Problem Relation Age of Onset   Breast cancer Mother    Arthritis Mother    Cancer Mother    Vision loss Mother    Hypertension Father    Hyperlipidemia Father    Prostate cancer Father    Cancer Father    Hearing loss Father    Hyperlipidemia Sister    Cancer Son    Stomach cancer Son    Esophageal cancer Son    Colon cancer Son    Asthma Maternal Grandfather    Stroke Maternal Grandfather    Vision loss Maternal Grandmother    Colon polyps Neg Hx    Diabetes Neg Hx    Kidney disease Neg Hx    Migraines Neg Hx    Rectal cancer Neg Hx     Social History   Socioeconomic History   Marital status: Married    Spouse name: Geographical information systems officer   Number of children: 2   Years of education: masters   Highest education level: Master's degree (e.g., MA, MS, MEng, MEd, MSW, MBA)  Occupational History   Occupation: Retired Runner, broadcasting/film/video  Tobacco Use   Smoking status: Never   Smokeless tobacco: Never  Vaping Use   Vaping status: Never Used  Substance and Sexual Activity   Alcohol use: Yes    Comment: Maybe 1 drink every 2 months   Drug use: No   Sexual activity: Not Currently    Birth control/protection: Post-menopausal  Other Topics Concern   Not on file  Social History Narrative   Lives with husband, Roger/2025 and 1 dog   Patient is right handed.   Patient drinks 64 oz of diet soda daily.   Social Drivers of Corporate investment banker Strain: Low Risk  (05/07/2024)   Overall Financial Resource Strain (CARDIA)    Difficulty of Paying Living Expenses: Not hard at all  Food Insecurity: No Food Insecurity (05/07/2024)   Hunger Vital Sign    Worried About Running Out of Food in the Last Year: Never true    Ran Out of Food in the Last Year: Never true  Transportation Needs: No Transportation Needs (05/07/2024)   PRAPARE - Administrator, Civil Service (Medical): No     Lack of Transportation (Non-Medical): No  Physical Activity: Insufficiently Active (05/07/2024)   Exercise Vital Sign    Days of Exercise per Week: 2 days    Minutes of Exercise per Session: 20 min  Stress: No Stress Concern Present (05/07/2024)   [place]  [employer] - Occupational Stress Questionnaire    Feeling of Stress: Only a little  Social Connections: Moderately Isolated (05/07/2024)   Social Connection and Isolation Panel    Frequency of Communication with Friends and Family: Once a week    Frequency of Social Gatherings with Friends and Family: Twice a week    Attends Religious Services: Never    Database administrator or Organizations: No    Attends Engineer, structural: Not on file    Marital Status: Married  Catering manager Violence: Not At Risk (05/08/2024)   Humiliation, Afraid, Rape, and Kick questionnaire    Fear of Current or Ex-Partner: No    Emotionally Abused: No    Physically Abused: No    Sexually Abused: No    Outpatient Medications Prior to Visit  Medication Sig Dispense Refill   aspirin 81 MG tablet Take 81 mg by mouth daily.     Azelastine -Fluticasone  (DYMISTA ) 137-50 MCG/ACT SUSP Place 2 sprays into both nostrils 1 day or 1 dose. 23 g 5   baclofen  (LIORESAL ) 10 MG tablet TAKE 1 TABLET BY MOUTH TWICE A DAY AS NEEDED FOR MUSCLE SPASMS 180 tablet 1   Blood Glucose Monitoring Suppl DEVI Use glucometer to check blood sugars once a day 1 Dose 0   CALCIUM  PO Take 1,200 mg by mouth daily.     Cholecalciferol (VITAMIN D3) 125 MCG (5000 UT) CAPS Take  1 capsule Daily     Cyanocobalamin (VITAMIN B12 SL) Place 500 mcg under the tongue daily.     DULoxetine  (CYMBALTA ) 60 MG capsule TAKE 1 CAPSULE DAILY FOR CHRONIC PAIN SYNDROME. 90 capsule 3   EPINEPHrine  0.3 mg/0.3 mL IJ SOAJ injection Inject 0.3 mg into the muscle as needed.     famotidine  (PEPCID ) 40 MG tablet Take  1 tablet    2 x / day (every 12 hours)  to Prevent Heart burn &  Indigestion 180 tablet 3   glucose blood (ONE TOUCH ULTRA TEST) test strip Check Blood sugar Daily for diabetes 100 each 3   Lancets (ONETOUCH ULTRASOFT) lancets Check Blood sugar  daily 100 each 3   levothyroxine  (SYNTHROID ) 125 MCG tablet Take 1 tablet (125 mcg total) by mouth daily. 30 tablet 11   loperamide  (IMODIUM ) 2 MG capsule Take 1 capsule (2 mg total) by mouth as needed for diarrhea or loose stools. 30 capsule 0   Magnesium 250 MG TABS Take 250 mg by mouth 2 (two) times daily.     metFORMIN  (GLUCOPHAGE -XR) 500 MG 24 hr tablet TAKE 2 TABLETS TWICE DAILY WITH MEALS FOR DIABETES 360 tablet 3   metoprolol  succinate (TOPROL -XL) 50 MG 24 hr tablet Take 1 tablet (50 mg total) by mouth daily. Take with or immediately following a meal. 90 tablet 3   montelukast  (SINGULAIR ) 10 MG tablet Take 1 tablet (10 mg total) by mouth at bedtime. 90 tablet 1   Multiple Vitamins-Minerals (MULTIVITAMIN PO) Take 1 tablet by mouth daily.      nortriptyline  (PAMELOR ) 10 MG capsule TAKE 1 CAPSULE BY MOUTH EVERYDAY AT BEDTIME 90 capsule 3   Omega-3 Fatty Acids (FISH OIL PO) Take 600 mg by mouth daily.     omeprazole  (PRILOSEC) 20 MG capsule TAKE 1 CAPSULE DAILY TO PREVENT HEARTBURN & ACID REFLUX 90 capsule 3   PRESCRIPTION MEDICATION Inject as directed every 30 (thirty) days. Allergy Shot     rosuvastatin  (CRESTOR ) 10 MG tablet TAKE 1 TABLET BY MOUTH EVERY DAY FOR  CHOLESTEROL 90 tablet 3   tirzepatide  (MOUNJARO ) 15 MG/0.5ML Pen Inject 15 mg into the skin every 7 (seven) days for diabetes. 6 mL 1   Turmeric (QC TUMERIC COMPLEX PO) Take 1 tablet by mouth daily.     losartan  (COZAAR ) 100 MG tablet TAKE 1 TABLET BY MOUTH DAILY FOR BLOOD PRESSURE AND KIDNEY PROTECTION 90 tablet 3   No facility-administered medications prior to visit.    Allergies  Allergen Reactions   Floxin [Ofloxacin] Hives   Invokana  [Canagliflozin ]     Hematuria- avoid class   Viberzi  [Eluxadoline ] Hives    Review of Systems   Constitutional:  Positive for weight loss. Negative for chills, fever and malaise/fatigue.       Intentional   HENT:  Negative for ear pain, hearing loss and tinnitus.   Eyes:  Negative for blurred vision and double vision.  Respiratory:  Negative for cough and shortness of breath.   Cardiovascular:  Negative for chest pain, palpitations and leg swelling.  Gastrointestinal:  Negative for abdominal pain, constipation, diarrhea, nausea and vomiting.  Genitourinary:  Negative for dysuria, frequency and urgency.  Musculoskeletal:  Negative for falls.  Neurological:  Negative for dizziness, focal weakness and headaches.  Psychiatric/Behavioral:  Negative for depression. The patient is not nervous/anxious.        Objective:    Physical Exam Constitutional:      General: Grace Barry is not in acute distress.    Appearance: Grace Barry is not ill-appearing.  Eyes:     Extraocular Movements: Extraocular movements intact.     Conjunctiva/sclera: Conjunctivae normal.  Cardiovascular:     Rate and Rhythm: Normal rate.  Pulmonary:     Effort: Pulmonary effort is normal.  Musculoskeletal:     Cervical back: Normal range of motion and neck supple.  Skin:    General: Skin is warm and dry.  Neurological:     General: No focal deficit present.     Mental Status: Grace Barry is alert and oriented to person, place, and time.     Cranial Nerves: No cranial nerve deficit.     Motor: No weakness.     Coordination: Coordination normal.     Gait: Gait normal.  Psychiatric:        Mood and Affect: Mood normal.        Behavior: Behavior normal.        Thought Content: Thought content normal.      BP 108/70 (BP Location: Left Arm, Patient Position: Sitting)   Pulse 94   Temp 97.9 F (36.6 C) (Temporal)   Ht 5' 2.5 (1.588 m)   Wt 152 lb (68.9 kg)   SpO2 99%   BMI 27.36 kg/m  Wt Readings from Last 3 Encounters:  05/28/24 152 lb (68.9 kg)  05/08/24 156 lb 3.2 oz (70.9 kg)  05/08/24 155 lb 3.2 oz (70.4 kg)        Assessment & Plan:   Problem List Items Addressed This Visit     Aortic atherosclerosis (HCC) by Abd  CT scan in 2020   Relevant Medications   losartan  (COZAAR ) 50 MG tablet   Dysgeusia   Essential hypertension   Relevant Medications   losartan  (COZAAR ) 50 MG tablet   Other Relevant Orders   Basic metabolic panel with GFR   Hypothyroidism - Primary   Relevant Orders   TSH   Orthostatic dizziness   Relevant Orders   Basic metabolic panel with GFR    Assessment and Plan Assessment & Plan  Hypothyroidism Managed with levothyroxine  125 mcg daily. TSH improved from 20 to 0.85, indicating potential stabilization. - Order thyroid  function tests to re-evaluate TSH levels. - Continue levothyroxine  125 mcg daily. - Ensure levothyroxine  is taken on an empty stomach. - Monitor for symptoms indicating need for dose adjustment.  Hypertension Managed with losartan  100 mg daily and metoprolol  succinate 50 mg daily. Home readings suggest possible overmedication. Check BMP - Reduce losartan  dose to 50 mg and continue Toprol  XL  - Consult pharmacist regarding pill splitting. - Monitor blood pressure at home. - Ensure adequate hydration.  Orthostatic lightheadedness Occurs occasionally when standing quickly, potentially related to antihypertensive regimen. - Monitor for persistence of lightheadedness. Orthostatic vitals done and Grace Barry is not postural  Check BMP - Ensure adequate hydration. - Evaluate blood pressure changes with medication adjustment.  Grace Barry is schedule to follow up with me in October for a fasting CPE   I have discontinued Ronal POUR. Steinberger's losartan . I am also having her start on losartan . Additionally, I am having her maintain her Magnesium, CALCIUM  PO, Multiple Vitamins-Minerals (MULTIVITAMIN PO), aspirin, PRESCRIPTION MEDICATION, Omega-3 Fatty Acids (FISH OIL PO), onetouch ultrasoft, Turmeric (QC TUMERIC COMPLEX PO), Cyanocobalamin (VITAMIN B12 SL), loperamide ,  baclofen , Vitamin D3, metoprolol  succinate, rosuvastatin , glucose blood, Blood Glucose Monitoring Suppl, levothyroxine , omeprazole , famotidine , metFORMIN , nortriptyline , tirzepatide , Azelastine -Fluticasone , montelukast , DULoxetine , and EPINEPHrine .  Meds ordered this encounter  Medications   losartan  (COZAAR ) 50 MG tablet    Sig: Take 1 tablet (50 mg total) by mouth daily.    Dispense:  90 tablet    Refill:  1    Supervising Provider:   ROLLENE NORRIS A [[id number]]

## 2024-05-28 NOTE — Telephone Encounter (Signed)
 I have sent an urgent message to [facility] asking if it has been done yet. The [place] office has a backlog

## 2024-05-28 NOTE — Patient Instructions (Signed)
  VISIT SUMMARY: Today, you came in for a follow-up visit to discuss your thyroid  function, blood pressure, weight loss, and other health concerns. We reviewed your current medications and symptoms to ensure your treatment plan is effective and adjusted as needed.  YOUR PLAN: -HYPOTHYROIDISM: Hypothyroidism is a condition where your thyroid  gland does not produce enough thyroid  hormone. Your TSH levels have improved significantly with your current dose of levothyroxine . Continue taking levothyroxine  125 mcg daily on an empty stomach. We will re-evaluate your TSH levels with a thyroid  function test to ensure your dosage remains appropriate. Please monitor for any symptoms that might suggest a need for dose adjustment.  -HYPERTENSION: Hypertension is high blood pressure. Your home blood pressure readings suggest that your current medication dose might be too high. We will reduce your dose of losartan  and consult with a pharmacist about how to split the pills. Please continue to monitor your blood pressure at home and ensure you stay adequately hydrated.  -ORTHOSTATIC LIGHTHEADEDNESS: Orthostatic lightheadedness is a feeling of dizziness when standing up quickly, which can be related to your blood pressure medication. We will monitor this symptom, especially as we adjust your blood pressure medication. Make sure to stay hydrated and report any persistent lightheadedness.  INSTRUCTIONS: Please follow up with a thyroid  function test to re-evaluate your TSH levels. Continue to monitor your blood pressure at home and stay hydrated. If you experience any persistent symptoms or have concerns, contact our office.  I will see you back in October for your fasting physical                     Contains text generated by [employer].                                 Contains text generated by [employer].

## 2024-05-28 NOTE — Progress Notes (Signed)
 Please see that she read her note regarding her TSH being low normal and the recommendation to skip her levothyroxine  dose 1 day out of every week until I see her back in October.

## 2024-05-28 NOTE — Telephone Encounter (Signed)
 I am sending email to Dr. Neda with ONO results.  I am unable to upload ONO in this encounter.   Dr. Neda can you please advise ONO results, I have sent it to your email.

## 2024-05-28 NOTE — Telephone Encounter (Signed)
 I have emailed the ONO results to [employer]

## 2024-05-29 ENCOUNTER — Telehealth: Payer: Self-pay | Admitting: Pulmonary Disease

## 2024-05-29 NOTE — Telephone Encounter (Signed)
 Overnight oximetry reviewed  Patient requires 2 L of oxygen  supplementation at night  She is normally on 2 L, this confirms that she still requires oxygen  supplementation

## 2024-05-29 NOTE — Telephone Encounter (Signed)
 ATC x1 LVM for patient to call our office back regarding ONO result's.

## 2024-05-29 NOTE — Telephone Encounter (Signed)
 I have uploaded the ONO results  Dr MALVA- please respond to to via this mychart encounter and let her know of the results  Thanks!

## 2024-05-30 ENCOUNTER — Telehealth: Payer: Self-pay

## 2024-05-30 DIAGNOSIS — E119 Type 2 diabetes mellitus without complications: Secondary | ICD-10-CM | POA: Diagnosis not present

## 2024-05-30 NOTE — Telephone Encounter (Signed)
 Spoke with patient regarding ONO result's   Overnight oximetry reviewed   Patient requires 2 L of oxygen  supplementation at night   She is normally on 2 L, this confirms that she still requires oxygen  supplementation  Patient's voice was understanding. Nothing else further needed.

## 2024-06-05 ENCOUNTER — Other Ambulatory Visit: Payer: Self-pay | Admitting: Nurse Practitioner

## 2024-06-05 DIAGNOSIS — E1169 Type 2 diabetes mellitus with other specified complication: Secondary | ICD-10-CM

## 2024-06-05 DIAGNOSIS — I10 Essential (primary) hypertension: Secondary | ICD-10-CM

## 2024-06-08 ENCOUNTER — Encounter: Payer: Self-pay | Admitting: Family Medicine

## 2024-06-08 DIAGNOSIS — I10 Essential (primary) hypertension: Secondary | ICD-10-CM

## 2024-06-09 MED ORDER — OMEPRAZOLE 20 MG PO CPDR: DELAYED_RELEASE_CAPSULE | ORAL | 1 refills | Status: AC

## 2024-06-09 MED ORDER — METOPROLOL SUCCINATE ER 50 MG PO TB24: 50.0000 mg | ORAL_TABLET | Freq: Every day | ORAL | 1 refills | Status: AC

## 2024-06-10 ENCOUNTER — Telehealth: Payer: Self-pay

## 2024-06-10 DIAGNOSIS — K080 Exfoliation of teeth due to systemic causes: Secondary | ICD-10-CM | POA: Diagnosis not present

## 2024-06-10 NOTE — Telephone Encounter (Signed)
 Order has been faxed back to [facility] for oxygen  06/06/2024

## 2024-06-17 ENCOUNTER — Encounter: Payer: Self-pay | Admitting: Pulmonary Disease

## 2024-06-19 DIAGNOSIS — G4733 Obstructive sleep apnea (adult) (pediatric): Secondary | ICD-10-CM | POA: Diagnosis not present

## 2024-06-19 DIAGNOSIS — R0902 Hypoxemia: Secondary | ICD-10-CM | POA: Diagnosis not present

## 2024-06-30 DIAGNOSIS — E119 Type 2 diabetes mellitus without complications: Secondary | ICD-10-CM | POA: Diagnosis not present

## 2024-07-10 ENCOUNTER — Encounter: Payer: Medicare Other | Admitting: Nurse Practitioner

## 2024-07-17 ENCOUNTER — Encounter: Payer: Medicare Other | Admitting: Nurse Practitioner

## 2024-07-20 DIAGNOSIS — G4733 Obstructive sleep apnea (adult) (pediatric): Secondary | ICD-10-CM | POA: Diagnosis not present

## 2024-07-20 DIAGNOSIS — R0902 Hypoxemia: Secondary | ICD-10-CM | POA: Diagnosis not present

## 2024-07-30 DIAGNOSIS — E119 Type 2 diabetes mellitus without complications: Secondary | ICD-10-CM | POA: Diagnosis not present

## 2024-07-31 ENCOUNTER — Other Ambulatory Visit: Payer: Self-pay | Admitting: Family Medicine

## 2024-08-08 DIAGNOSIS — E119 Type 2 diabetes mellitus without complications: Secondary | ICD-10-CM | POA: Diagnosis not present

## 2024-08-12 ENCOUNTER — Other Ambulatory Visit: Payer: Self-pay

## 2024-08-12 DIAGNOSIS — E1169 Type 2 diabetes mellitus with other specified complication: Secondary | ICD-10-CM

## 2024-08-12 MED ORDER — ROSUVASTATIN CALCIUM 10 MG PO TABS: ORAL_TABLET | ORAL | 1 refills | Status: AC

## 2024-08-20 ENCOUNTER — Encounter: Payer: Self-pay | Admitting: Family Medicine

## 2024-08-20 ENCOUNTER — Ambulatory Visit

## 2024-08-20 ENCOUNTER — Ambulatory Visit: Admitting: Family Medicine

## 2024-08-20 ENCOUNTER — Other Ambulatory Visit (HOSPITAL_COMMUNITY): Payer: Self-pay

## 2024-08-20 ENCOUNTER — Other Ambulatory Visit: Payer: Self-pay | Admitting: Medical Genetics

## 2024-08-20 VITALS — BP 122/66 | HR 91 | Temp 97.6°F | Ht 62.5 in | Wt 151.0 lb

## 2024-08-20 DIAGNOSIS — I10 Essential (primary) hypertension: Secondary | ICD-10-CM

## 2024-08-20 DIAGNOSIS — E1129 Type 2 diabetes mellitus with other diabetic kidney complication: Secondary | ICD-10-CM | POA: Diagnosis not present

## 2024-08-20 DIAGNOSIS — Z0000 Encounter for general adult medical examination without abnormal findings: Secondary | ICD-10-CM

## 2024-08-20 DIAGNOSIS — F3340 Major depressive disorder, recurrent, in remission, unspecified: Secondary | ICD-10-CM

## 2024-08-20 DIAGNOSIS — E559 Vitamin D deficiency, unspecified: Secondary | ICD-10-CM | POA: Diagnosis not present

## 2024-08-20 DIAGNOSIS — J309 Allergic rhinitis, unspecified: Secondary | ICD-10-CM | POA: Diagnosis not present

## 2024-08-20 DIAGNOSIS — E039 Hypothyroidism, unspecified: Secondary | ICD-10-CM

## 2024-08-20 DIAGNOSIS — E1169 Type 2 diabetes mellitus with other specified complication: Secondary | ICD-10-CM | POA: Diagnosis not present

## 2024-08-20 DIAGNOSIS — Z0001 Encounter for general adult medical examination with abnormal findings: Secondary | ICD-10-CM

## 2024-08-20 DIAGNOSIS — E785 Hyperlipidemia, unspecified: Secondary | ICD-10-CM | POA: Diagnosis not present

## 2024-08-20 DIAGNOSIS — G4736 Sleep related hypoventilation in conditions classified elsewhere: Secondary | ICD-10-CM | POA: Diagnosis not present

## 2024-08-20 DIAGNOSIS — K219 Gastro-esophageal reflux disease without esophagitis: Secondary | ICD-10-CM | POA: Diagnosis not present

## 2024-08-20 DIAGNOSIS — D225 Melanocytic nevi of trunk: Secondary | ICD-10-CM

## 2024-08-20 DIAGNOSIS — M8580 Other specified disorders of bone density and structure, unspecified site: Secondary | ICD-10-CM

## 2024-08-20 DIAGNOSIS — Z7985 Long-term (current) use of injectable non-insulin antidiabetic drugs: Secondary | ICD-10-CM

## 2024-08-20 DIAGNOSIS — I700 Atherosclerosis of aorta: Secondary | ICD-10-CM

## 2024-08-20 DIAGNOSIS — Z7185 Encounter for immunization safety counseling: Secondary | ICD-10-CM

## 2024-08-20 LAB — CBC WITH DIFFERENTIAL/PLATELET
Basophils Absolute: 0.1 K/uL (ref 0.0–0.1)
Basophils Relative: 0.6 % (ref 0.0–3.0)
Eosinophils Absolute: 0.2 K/uL (ref 0.0–0.7)
Eosinophils Relative: 2.3 % (ref 0.0–5.0)
HCT: 39.9 % (ref 36.0–46.0)
Hemoglobin: 13.1 g/dL (ref 12.0–15.0)
Lymphocytes Relative: 33.3 % (ref 12.0–46.0)
Lymphs Abs: 3.3 K/uL (ref 0.7–4.0)
MCHC: 32.7 g/dL (ref 30.0–36.0)
MCV: 85.2 fl (ref 78.0–100.0)
Monocytes Absolute: 0.6 K/uL (ref 0.1–1.0)
Monocytes Relative: 6.2 % (ref 3.0–12.0)
Neutro Abs: 5.6 K/uL (ref 1.4–7.7)
Neutrophils Relative %: 57.6 % (ref 43.0–77.0)
Platelets: 373 K/uL (ref 150.0–400.0)
RBC: 4.69 Mil/uL (ref 3.87–5.11)
RDW: 14.4 % (ref 11.5–15.5)
WBC: 9.8 K/uL (ref 4.0–10.5)

## 2024-08-20 LAB — LIPID PANEL
Cholesterol: 93 mg/dL (ref 0–200)
HDL: 45.9 mg/dL (ref >39.00)
LDL Cholesterol: 35 mg/dL (ref 0–99)
NonHDL: 47.21
Total CHOL/HDL Ratio: 2
Triglycerides: 59 mg/dL (ref 0.0–149.0)
VLDL: 11.8 mg/dL (ref 0.0–40.0)

## 2024-08-20 LAB — COMPREHENSIVE METABOLIC PANEL WITH GFR
ALT: 20 U/L (ref 0–35)
AST: 17 U/L (ref 0–37)
Albumin: 4.3 g/dL (ref 3.5–5.2)
Alkaline Phosphatase: 56 U/L (ref 39–117)
BUN: 8 mg/dL (ref 6–23)
CO2: 24 meq/L (ref 19–32)
Calcium: 9.7 mg/dL (ref 8.4–10.5)
Chloride: 100 meq/L (ref 96–112)
Creatinine, Ser: 0.69 mg/dL (ref 0.40–1.20)
GFR: 85.25 mL/min (ref >60.00)
Glucose, Bld: 92 mg/dL (ref 70–99)
Potassium: 4.4 meq/L (ref 3.5–5.1)
Sodium: 134 meq/L — ABNORMAL LOW (ref 135–145)
Total Bilirubin: 0.4 mg/dL (ref 0.2–1.2)
Total Protein: 6.9 g/dL (ref 6.0–8.3)

## 2024-08-20 LAB — TSH: TSH: 1.8 u[IU]/mL (ref 0.35–5.50)

## 2024-08-20 LAB — MICROALBUMIN / CREATININE URINE RATIO
Creatinine,U: 162.2 mg/dL
Microalb Creat Ratio: 50.8 mg/g — ABNORMAL HIGH (ref 0.0–30.0)
Microalb, Ur: 8.2 mg/dL — ABNORMAL HIGH (ref 0.0–1.9)

## 2024-08-20 LAB — VITAMIN B12: Vitamin B-12: 349 pg/mL (ref 211–911)

## 2024-08-20 LAB — HEMOGLOBIN A1C: Hgb A1c MFr Bld: 5.6 % (ref 4.6–6.5)

## 2024-08-20 LAB — VITAMIN D 25 HYDROXY (VIT D DEFICIENCY, FRACTURES): VITD: 50.15 ng/mL (ref 30.00–100.00)

## 2024-08-20 MED ORDER — TIRZEPATIDE 15 MG/0.5ML ~~LOC~~ SOAJ
15.0000 mg | SUBCUTANEOUS | 1 refills | Status: AC
  Filled 2024-08-20: qty 6, 84d supply, fill #0

## 2024-08-20 NOTE — Progress Notes (Signed)
 Complete physical exam  Patient: Grace Barry   DOB: [birth year]   73 y.o. Female  MRN: [id number]  Subjective:    Chief Complaint  Patient presents with   Annual Exam    fasting   She is here for a complete physical exam.  Discussed the use of AI scribe software for clinical note transcription with the patient, who gave verbal consent to proceed.  History of Present Illness Grace Barry is a 73 year old female who presents for her annual physical exam and preventive healthcare visit. Also following up on chronic health conditions.   Upper respiratory symptoms - Sinus issues attributed to frequent nose blowing  Thyroid  dysfunction - Hypothyroidism managed with levothyroxine  - Skips one dose weekly due to low-normal TSH levels  Chronic kidney disease - Stage 2 chronic kidney disease - Previous improvement in kidney function with good hydration  Bone health - Osteopenia confirmed by bone density testing - Bone density test completed as part of preventive care  Nocturnal hypoxemia - Uses nighttime oxygen  therapy for oxygen  desaturation during sleep - No diagnosis of sleep apnea  Migraine headaches - Follows with neurology for migraine management - Managed with nortriptyline   Cardiac abnormalities - Annual cardiology follow-up for history of abnormal EKG and cardiac fistula  Gastroesophageal reflux disease - Takes famotidine  bid and omeprazole  for acid reflux with good symptom control - No gastroenterology evaluation in approximately 20 years  Diabetes mellitus - Diabetes well-controlled with metformin  and tirzepatide  - Hemoglobin A1c of 5.7 in May 2025  Hypertension - Takes losartan  and metoprolol  for blood pressure management  Sensorineural hearing loss - Sudden onset sensorineural hearing loss in left ear - Uses a hearing aid  Arthroplasty and joint health - History of bilateral knee replacements   Myalgia and depressive symptoms - Takes duloxetine   (Cymbalta ) for fibromyalgia and depression  Preventive healthcare maintenance - Recent eye exam at [facility], colonoscopy, bone density test, and mammogram completed - Considering newest pneumonia and RSV vaccines - Received pneumococcal vaccines in 2015 and 2018   Dr Kate- cardiologist  Dr. Neda pulmonology  [facility] for migraines  Dr. Jane- ortho Dr. Jeneal- Allergist     Health Maintenance  Topic Date Due   Eye exam for diabetics  05/13/2024   COVID-19 Vaccine (5 - 2025-26 season) 09/05/2024*   Flu Shot  01/27/2025*   Hemoglobin A1C  09/19/2024   Yearly kidney health urinalysis for diabetes  11/05/2024   Breast Cancer Screening  04/07/2025   DEXA scan (bone density measurement)  04/22/2025   Medicare Annual Wellness Visit  05/08/2025   Yearly kidney function blood test for diabetes  05/28/2025   DTaP/Tdap/Td vaccine (5 - Td or Tdap) 08/09/2025   Complete foot exam   08/20/2025   Colon Cancer Screening  05/06/2026   Pneumococcal Vaccine for age over 18  Completed   Hepatitis C Screening  Completed   Zoster (Shingles) Vaccine  Completed   Meningitis B Vaccine  Aged Out   Hepatitis B Vaccine  Discontinued  *Topic was postponed. The date shown is not the original due date.    Wears seatbelt always, uses sunscreen, smoke detectors in home and functioning, does not text while driving, feels safe in home environment.  Depression screening:    05/28/2024    1:01 PM 05/08/2024   10:42 AM 03/19/2024   10:47 AM  Depression screen PHQ 2/9  Decreased Interest 1 0 1  Down, Depressed, Hopeless 1 0 0  PHQ -  2 Score 2 0 1  Altered sleeping 1 3 0  Tired, decreased energy 2 3 3   Change in appetite 0 0 0  Feeling bad or failure about yourself  0 0 0  Trouble concentrating 0 0 0  Moving slowly or fidgety/restless 0 0 0  Suicidal thoughts 0 0 0  PHQ-9 Score 5 6 4   Difficult doing work/chores  Somewhat difficult Somewhat difficult   Anxiety Screening:    03/19/2024    10:48 AM  GAD 7 : Generalized Anxiety Score  Nervous, Anxious, on Edge 0  Control/stop worrying 0  Worry too much - different things 0  Trouble relaxing 0  Restless 0  Easily annoyed or irritable 0  Afraid - awful might happen 0  Total GAD 7 Score 0  Anxiety Difficulty Not difficult at all    Vision:Within last year and Dental: No current dental problems and Receives regular dental care  Patient Active Problem List   Diagnosis Date Noted   Orthostatic dizziness 05/28/2024   Dysgeusia 05/28/2024   Hypersomnia with long sleep time, idiopathic 10/04/2023   Adenomatous polyp of ascending colon 05/07/2023   Polyp of sigmoid colon 05/07/2023   Oxygen  dependent 03/08/2023   Hypoxemia associated with sleep 10/02/2020   Leukocytosis 09/27/2020   Tremor 07/28/2020   Sleep behavior disorder, REM 07/20/2020   Sleep paralysis, recurrent isolated 07/20/2020   Bad dreams 07/20/2020   Chronic fatigue syndrome with fibromyalgia 07/20/2020   Psychophysiological insomnia 07/20/2020   Aortic atherosclerosis (HCC) by Abd  CT scan in 2020 12/12/2019   Pain 01/17/2019   Degenerative tear of triangular fibrocartilage complex (TFCC) of left wrist 01/17/2019   Hyperlipidemia associated with type 2 diabetes mellitus (HCC) 11/07/2018   Osteopenia 11/07/2018   Seasonal and perennial allergic rhinitis 07/17/2018   Sensorineural hearing loss (SNHL) of left ear with unrestricted hearing of right ear 12/31/2017   Bilateral impacted cerumen 05/31/2016   Dysfunction of left eustachian tube 05/31/2016   Subjective tinnitus of left ear 01/24/2016   Asthma 01/03/2016   Sensorineural hearing loss (SNHL), bilateral 01/03/2016   Allergic conjunctivitis 07/10/2015   Vitamin D  deficiency disease 05/19/2015   Medication management 05/19/2015   Other long term (current) drug therapy 05/19/2015   Migraine 02/19/2015   Vertigo 02/19/2015   History of colonic polyps 01/15/2015   IBS (irritable bowel syndrome)  09/01/2014   Diarrhea 09/01/2014   Morbid obesity (HCC) 07/01/2014   CKD stage 2 due to type 2 diabetes mellitus (HCC) 07/01/2014   Obesity with body mass index 30 or greater 07/01/2014   Fatty liver    Arthritis    Hypothyroidism 12/15/2010   T2_NIDDM w/CKD (HCC) 12/15/2010   Depression, major, recurrent, in remission (HCC) 12/15/2010   Essential hypertension 12/15/2010   GERD 12/15/2010   Depression 12/15/2010   Obstructive sleep apnea syndrome 12/15/2010   Type 2 diabetes mellitus (HCC) 12/15/2010   Past Medical History:  Diagnosis Date   Allergy    Anxiety    Arthritis    Cataract    Chronic kidney disease (CKD), stage II (mild) 07/01/2014   Overview:  Overview:  Due to DM, last GFR 64    Degenerative tear of triangular fibrocartilage complex (TFCC) of left wrist 01/17/2019   Depression    Fibromyalgia    GERD (gastroesophageal reflux disease)    Hyperlipidemia    Hypertension    Hypothyroidism    Hypoxemia    IBS (irritable bowel syndrome)    Liver disease  Migraine 02/19/2015   Migraines    Obesity    Osteoporosis    Oxygen  deficiency    wears oxygen  at [facility]- 2 liters   Pneumonia    Sensorineural hearing loss (SNHL) of left ear with unrestricted hearing of right ear 12/31/2017   Sleep apnea    uses oral device none   Sleep difficulties    Thyroid  disease    hypothyroidism   Tuberculosis    positive PPD test, chest x ray negative   Type II or unspecified type diabetes mellitus without mention of complication, not stated as uncontrolled    Urticaria    Ventral hernia    Vertigo 02/19/2015   Past Surgical History:  Procedure Laterality Date   ABDOMINAL HYSTERECTOMY     ADENOIDECTOMY     APPENDECTOMY     CARPAL TUNNEL RELEASE     bilateral   CATARACT EXTRACTION, BILATERAL     CESAREAN SECTION     x2   CHOLECYSTECTOMY     COLONOSCOPY     COLONOSCOPY WITH PROPOFOL  N/A 05/07/2023   Procedure: COLONOSCOPY WITH PROPOFOL ;  Surgeon: Shila Gustav GAILS,  MD;  Location: [facility];  Service: Gastroenterology;  Laterality: N/A;   HERNIA REPAIR  07/24/2011   ventral hernia   JOINT REPLACEMENT     bilateral knees   KNEE ARTHROSCOPY     bilateral   POLYPECTOMY  05/07/2023   Procedure: POLYPECTOMY;  Surgeon: Shila Gustav GAILS, MD;  Location: [facility];  Service: Gastroenterology;;   SINOSCOPY     TONSILLECTOMY     TOTAL KNEE ARTHROPLASTY     bilateral   TUBAL LIGATION     Social History   Tobacco Use   Smoking status: Never   Smokeless tobacco: Never  Vaping Use   Vaping status: Never Used  Substance Use Topics   Alcohol use: Yes    Comment: Maybe 1 drink every 2 months   Drug use: No      Patient Care Team: Lendia Boby CROME, NP-C as PCP - General (Family Medicine) Trudy Righter, DC as Anesthesiologist (Chiropractic Medicine)   Outpatient Medications Prior to Visit  Medication Sig   aspirin 81 MG tablet Take 81 mg by mouth daily.   Azelastine -Fluticasone  (DYMISTA ) 137-50 MCG/ACT SUSP Place 2 sprays into both nostrils 1 day or 1 dose.   baclofen  (LIORESAL ) 10 MG tablet TAKE 1 TABLET BY MOUTH TWICE A DAY AS NEEDED FOR MUSCLE SPASMS   Blood Glucose Monitoring Suppl DEVI Use glucometer to check blood sugars once a day   CALCIUM  PO Take 1,200 mg by mouth daily.   Cholecalciferol (VITAMIN D3) 125 MCG (5000 UT) CAPS Take  1 capsule Daily   Cyanocobalamin (VITAMIN B12 SL) Place 500 mcg under the tongue daily.   DULoxetine  (CYMBALTA ) 60 MG capsule TAKE 1 CAPSULE DAILY FOR CHRONIC PAIN SYNDROME.   EPINEPHrine  0.3 mg/0.3 mL IJ SOAJ injection Inject 0.3 mg into the muscle as needed.   famotidine  (PEPCID ) 40 MG tablet Take  1 tablet    2 x / day (every 12 hours)  to Prevent Heart burn & Indigestion   glucose blood (ONE TOUCH ULTRA TEST) test strip Check Blood sugar Daily for diabetes   Lancets (ONETOUCH ULTRASOFT) lancets Check Blood sugar  daily   levothyroxine  (SYNTHROID ) 125 MCG tablet Take 1 tablet (125 mcg total) by mouth  daily.   loperamide  (IMODIUM ) 2 MG capsule Take 1 capsule (2 mg total) by mouth as needed for diarrhea or loose stools.  losartan  (COZAAR ) 50 MG tablet Take 1 tablet (50 mg total) by mouth daily.   Magnesium 250 MG TABS Take 250 mg by mouth 2 (two) times daily.   metFORMIN  (GLUCOPHAGE -XR) 500 MG 24 hr tablet TAKE 2 TABLETS TWICE DAILY WITH MEALS FOR DIABETES   metoprolol  succinate (TOPROL -XL) 50 MG 24 hr tablet Take 1 tablet (50 mg total) by mouth daily. Take with or immediately following a meal.   montelukast  (SINGULAIR ) 10 MG tablet TAKE 1 TABLET BY MOUTH EVERYDAY AT BEDTIME   Multiple Vitamins-Minerals (MULTIVITAMIN PO) Take 1 tablet by mouth daily.    nortriptyline  (PAMELOR ) 10 MG capsule TAKE 1 CAPSULE BY MOUTH EVERYDAY AT BEDTIME   Omega-3 Fatty Acids (FISH OIL PO) Take 600 mg by mouth daily.   omeprazole  (PRILOSEC) 20 MG capsule Take 1 capsule daily to prevent Heartburn & Acid Reflux   PRESCRIPTION MEDICATION Inject as directed every 30 (thirty) days. Allergy Shot   rosuvastatin  (CRESTOR ) 10 MG tablet TAKE 1 TABLET BY MOUTH EVERY DAY FOR CHOLESTEROL   Turmeric (QC TUMERIC COMPLEX PO) Take 1 tablet by mouth daily.   [DISCONTINUED] tirzepatide  (MOUNJARO ) 15 MG/0.5ML Pen Inject 15 mg into the skin every 7 (seven) days for diabetes.   No facility-administered medications prior to visit.    Review of Systems  Constitutional:  Positive for weight loss. Negative for chills, fever and malaise/fatigue.       Intentional   HENT:  Positive for congestion. Negative for ear pain, sinus pain and sore throat.        Hearing aid on left   Eyes:  Negative for blurred vision, double vision and pain.  Respiratory:  Negative for cough, shortness of breath and wheezing.   Cardiovascular:  Negative for chest pain, palpitations and leg swelling.  Gastrointestinal:  Negative for abdominal pain, constipation, diarrhea, nausea and vomiting.  Genitourinary:  Negative for dysuria, frequency and urgency.   Musculoskeletal:  Negative for back pain, joint pain and myalgias.  Skin:  Negative for rash.  Neurological:  Negative for dizziness, tingling, focal weakness and headaches.  Psychiatric/Behavioral:  Negative for depression, memory loss and suicidal ideas. The patient is not nervous/anxious.        Objective:    BP 122/66   Pulse 91   Temp 97.6 F (36.4 C) (Temporal)   Ht 5' 2.5 (1.588 m)   Wt 151 lb (68.5 kg)   SpO2 99%   BMI 27.18 kg/m  BP Readings from Last 3 Encounters:  08/20/24 122/66  05/28/24 108/70  05/08/24 97/65   Wt Readings from Last 3 Encounters:  08/20/24 151 lb (68.5 kg)  05/28/24 152 lb (68.9 kg)  05/08/24 156 lb 3.2 oz (70.9 kg)    Physical Exam Constitutional:      General: She is not in acute distress.    Appearance: She is not ill-appearing.  HENT:     Right Ear: Tympanic membrane, ear canal and external ear normal.     Left Ear: Tympanic membrane, ear canal and external ear normal.     Nose: Nose normal.     Mouth/Throat:     Mouth: Mucous membranes are moist.     Pharynx: Oropharynx is clear.  Eyes:     Extraocular Movements: Extraocular movements intact.     Conjunctiva/sclera: Conjunctivae normal.     Pupils: Pupils are equal, round, and reactive to light.  Neck:     Thyroid : No thyroid  mass, thyromegaly or thyroid  tenderness.     Vascular: No carotid  bruit.  Cardiovascular:     Rate and Rhythm: Normal rate and regular rhythm.     Pulses: Normal pulses.     Heart sounds: Normal heart sounds.  Pulmonary:     Effort: Pulmonary effort is normal.     Breath sounds: Normal breath sounds.  Abdominal:     General: Bowel sounds are normal.     Palpations: Abdomen is soft.     Tenderness: There is no abdominal tenderness. There is no right CVA tenderness, left CVA tenderness, guarding or rebound.  Musculoskeletal:        General: Normal range of motion.     Cervical back: Normal range of motion and neck supple. No tenderness.     Right  lower leg: No edema.     Left lower leg: No edema.  Lymphadenopathy:     Cervical: No cervical adenopathy.  Skin:    General: Skin is warm and dry.     Findings: No lesion or rash.     Comments: Numerous nevi, cherry angiomas  Irregular, multi brown colored nevus on mid back  Neurological:     General: No focal deficit present.     Mental Status: She is alert and oriented to person, place, and time.     Cranial Nerves: No cranial nerve deficit.     Sensory: No sensory deficit.     Motor: No weakness.     Gait: Gait normal.  Psychiatric:        Mood and Affect: Mood normal.        Behavior: Behavior normal.        Thought Content: Thought content normal.      No results found for any visits on 08/20/24.    Assessment & Plan:    Routine Health Maintenance and Physical Exam Problem List Items Addressed This Visit     Aortic atherosclerosis (HCC) by Abd  CT scan in 2020   Relevant Orders   Lipid panel   Depression, major, recurrent, in remission (HCC)   Essential hypertension   Relevant Orders   CBC with Differential/Platelet   Comprehensive metabolic panel with GFR   GERD   Hyperlipidemia associated with type 2 diabetes mellitus (HCC)   Relevant Medications   tirzepatide  (MOUNJARO ) 15 MG/0.5ML Pen   Other Relevant Orders   Lipid panel   Hypothyroidism   Relevant Orders   TSH   Hypoxemia associated with sleep   Osteopenia   T2_NIDDM w/CKD (HCC)   Relevant Medications   tirzepatide  (MOUNJARO ) 15 MG/0.5ML Pen   Other Relevant Orders   CBC with Differential/Platelet   Comprehensive metabolic panel with GFR   Hemoglobin A1c   TSH   Vitamin B12   Microalbumin / creatinine urine ratio   Other Visit Diagnoses       Encounter for general adult medical examination with abnormal findings    -  Primary     Vitamin D  deficiency       Relevant Orders   VITAMIN D  25 Hydroxy (Vit-D Deficiency, Fractures)     Atypical nevus of back       Relevant Orders   Ambulatory  referral to Dermatology     Immunization counseling           Assessment and Plan Assessment & Plan Adult Wellness Visit/preventive healthcare Annual physical exam conducted. Blood work from July showed low normal TSH, leading to adjustment of levothyroxine  dosage. Kidney function improved from previous low normal stage 2 chronic kidney disease. Eye  exam completed recently. Colonoscopy and bone density screenings are up to date. Tetanus vaccine is current. Mammogram done in June. Discussed vaccines including RSV and pneumonia. Recommended timing of vaccines to avoid interference with allergy shots. - Check thyroid  function - Perform foot exam - Order A1c and cholesterol labs - Recommend RSV and Prevnar 20 vaccines - Advise to time Prevnar 20 vaccine with allergy shots - Recommend flu vaccine  Type 2 diabetes mellitus, well controlled Type 2 diabetes is well controlled with a recent A1c of 5.7. She is on metformin  and tirzepatide , which she reports as effective in controlling symptoms and food cravings. - Continue metformin  and tirzepatide  - Order A1c test - order urine microalbumin test   Hypertension Hypertension managed with losartan  and metoprolol . - Continue losartan  and metoprolol   Hyperlipidemia Hyperlipidemia managed with statin therapy. - Continue Crestor  - Check fasting lipids   Gastroesophageal reflux disease (GERD) GERD symptoms controlled with famotidine  and omeprazole . Discussed potential interference with absorption of minerals. Plan to reduce famotidine  dosage to assess symptom control. - Reduce famotidine  to once daily for 2-4 weeks - Monitor symptoms and consider further reduction if tolerated - Advise gradual reduction of omeprazole  if symptoms allow  Migraine Migraine managed with nortriptyline . - Continue nortriptyline  - follow up with [facility] as recommended   Obstructive sleep-related hypoxemia without sleep apnea Obstructive sleep-related hypoxemia  managed with nighttime oxygen . No sleep apnea present. Oxygen  desaturation noted during sleep study. - Continue nighttime oxygen  therapy - Follow with pulmonology   Atypical Nevis of back Atypical nevus on the back noted to be irregular. Dermatology referral recommended for further evaluation. - Refer to dermatology for evaluation of atypical nevus  Osteopenia Osteopenia confirmed by recent bone density scan. - Continue vitamin D , exercising and calcium  intake   Vitamin D  deficiency Vitamin D  levels previously adequate with supplementation. Current dosage unknown. - Continue vitamin D  supplementation - Check vitamin D  level   Aortic atherosclerosis - Continue statin therapy       Return in about 6 months (around 02/18/2025) for chronic health conditions.     Boby Mackintosh, NP-C

## 2024-08-20 NOTE — Patient Instructions (Addendum)
 Ok to get Prevnar 20 at your pharmacy.   I recommend also getting the RSV and flu vaccines.   Reduce evening dose of famotidine  and see how you do.   I referred you to [facility] and they will call you

## 2024-08-21 LAB — OPHTHALMOLOGY REPORT-SCANNED

## 2024-08-22 ENCOUNTER — Ambulatory Visit: Payer: Self-pay | Admitting: Family Medicine

## 2024-08-29 ENCOUNTER — Ambulatory Visit (INDEPENDENT_AMBULATORY_CARE_PROVIDER_SITE_OTHER)

## 2024-08-29 DIAGNOSIS — J309 Allergic rhinitis, unspecified: Secondary | ICD-10-CM | POA: Diagnosis not present

## 2024-10-02 ENCOUNTER — Ambulatory Visit: Payer: Self-pay | Admitting: *Deleted

## 2024-10-02 ENCOUNTER — Ambulatory Visit: Payer: Self-pay | Admitting: Internal Medicine

## 2024-10-02 ENCOUNTER — Ambulatory Visit: Admitting: Internal Medicine

## 2024-10-02 ENCOUNTER — Ambulatory Visit
Admission: RE | Admit: 2024-10-02 | Discharge: 2024-10-02 | Disposition: A | Source: Ambulatory Visit | Attending: Internal Medicine

## 2024-10-02 ENCOUNTER — Encounter: Payer: Self-pay | Admitting: Internal Medicine

## 2024-10-02 VITALS — BP 108/68 | Temp 97.7°F

## 2024-10-02 DIAGNOSIS — R310 Gross hematuria: Secondary | ICD-10-CM

## 2024-10-02 DIAGNOSIS — R1024 Suprapubic pain: Secondary | ICD-10-CM | POA: Diagnosis not present

## 2024-10-02 DIAGNOSIS — N3001 Acute cystitis with hematuria: Secondary | ICD-10-CM | POA: Diagnosis not present

## 2024-10-02 DIAGNOSIS — N3000 Acute cystitis without hematuria: Secondary | ICD-10-CM | POA: Diagnosis not present

## 2024-10-02 LAB — POC URINALSYSI DIPSTICK (AUTOMATED)
Bilirubin, UA: NEGATIVE
Glucose, UA: NEGATIVE
Ketones, UA: NEGATIVE
Nitrite, UA: POSITIVE
Protein, UA: POSITIVE — AB
Spec Grav, UA: 1.015 (ref 1.010–1.025)
Urobilinogen, UA: NEGATIVE U/dL — AB
pH, UA: 6 (ref 5.0–8.0)

## 2024-10-02 MED ORDER — SULFAMETHOXAZOLE-TRIMETHOPRIM 800-160 MG PO TABS: 1.0000 | ORAL_TABLET | Freq: Two times a day (BID) | ORAL | 0 refills | Status: AC

## 2024-10-02 NOTE — Telephone Encounter (Signed)
  FYI Only or Action Required?: FYI only for provider: appointment scheduled on 10/02/24.  Patient was last seen in primary care on 08/20/2024 by Lendia Boby CROME, NP-C.  Called Nurse Triage reporting Dysuria.  Symptoms began yesterday.  Interventions attempted: Nothing.  Symptoms are: back pain, blood in urine .  Triage Disposition: See HCP Within 4 Hours (Or PCP Triage)  Patient/caregiver understands and will follow disposition?: Yes  CAL notified of appt scheduled at 10:20 am today .              Copied from CRM #[id number]. Topic: Clinical - Red Word Triage >> Oct 02, 2024  9:18 AM Vena HERO wrote: Red Word that prompted transfer to Nurse Triage: pt has uti symptoms, started last evening Reason for Disposition  [1] Pain or burning with passing urine AND [2] side (flank) or back pain present  Answer Assessment - Initial Assessment Questions Appt today with other provider, none available with PCP. Recommended if sx worsen go to ED or call 911.  Patient concerned she may have passed kidney stone last night.     1. COLOR of URINE: Describe the color of the urine.  (e.g., tea-colored, pink, red, bloody) Do you have blood clots in your urine? (e.g., none, pea, grape, small coin)     Bloody with clots pea size  2. ONSET: When did the bleeding start?      Last night  3. EPISODES: How many times has there been blood in the urine? or How many times today?     5- 6 times  4. PAIN with URINATION: Is there any pain with passing your urine? If Yes, ask: How bad is the pain?  (Scale 1-10; or mild, moderate, severe)     Mild  5. FEVER: Do you have a fever? If Yes, ask: What is your temperature, how was it measured, and when did it start?     No  6. ASSOCIATED SYMPTOMS: Are you passing urine more frequently than usual?     Yes  7. OTHER SYMPTOMS: Do you have any other symptoms? (e.g., back/flank pain, abdomen pain, vomiting)     Back pain , urinary frequency,  urinating blood with clots size of pea. No fever no severe pain, no feeling of passing out. 8. PREGNANCY: Is there any Cornick you are pregnant? When was your last menstrual period?     na  Protocols used: Urine - Blood In-A-AH

## 2024-10-02 NOTE — Patient Instructions (Addendum)
      Medications changes include :   None    A referral was ordered urology and someone will call you to schedule an appointment.   A ct scan of your kidneys was ordered   Return if symptoms worsen or fail to improve.

## 2024-10-02 NOTE — Progress Notes (Signed)
 Subjective:    Patient ID: Grace Barry, female    DOB: 10/15/1951, 73 y.o.   MRN: [id number]      HPI Najia is here for  Chief Complaint  Patient presents with   Acute Visit    blood in urine, passing pea size clots, back pain    Discussed the use of AI scribe software for clinical note transcription with the patient, who gave verbal consent to proceed.  History of Present Illness Grace Barry is a 73 year old female who presents with hematuria and recent urinary symptoms.  She began experiencing sharp pain in the left lower back area yesterday, describing it as 'spasms' or 'bladder spasms'. By 7 PM, she developed urinary urgency, frequency every 10-15 minutes, and dysuria. Around 2-3 AM, she started passing a significant amount of blood and pea-sized clots in her urine. This morning, she reports no pain but continues to notice blood in her urine.  She has no history of kidney or bladder stones but has had urinary tract infections in the past, though not recently. She recalls a similar episode of hematuria approximately five years ago, which was attributed to Invokana , a medication she was taking at the time. After stopping the medication, she had no further issues.  No fever, chills, nausea, headaches, lightheadedness, or current lower back pain. She is not on any blood thinners. The pain she experienced was primarily in the left lower back during the night.  There is no family history of kidney stones.  .    Medications and allergies reviewed with patient and updated if appropriate.  Current Outpatient Medications on File Prior to Visit  Medication Sig Dispense Refill   aspirin 81 MG tablet Take 81 mg by mouth daily.     Azelastine -Fluticasone  (DYMISTA ) 137-50 MCG/ACT SUSP Place 2 sprays into both nostrils 1 day or 1 dose. 23 g 5   baclofen  (LIORESAL ) 10 MG tablet TAKE 1 TABLET BY MOUTH TWICE A DAY AS NEEDED FOR MUSCLE SPASMS 180 tablet 1   Blood Glucose Monitoring  Suppl DEVI Use glucometer to check blood sugars once a day 1 Dose 0   CALCIUM  PO Take 1,200 mg by mouth daily.     Cholecalciferol (VITAMIN D3) 125 MCG (5000 UT) CAPS Take  1 capsule Daily     Cyanocobalamin  (VITAMIN B12 SL) Place 500 mcg under the tongue daily.     DULoxetine  (CYMBALTA ) 60 MG capsule TAKE 1 CAPSULE DAILY FOR CHRONIC PAIN SYNDROME. 90 capsule 3   EPINEPHrine  0.3 mg/0.3 mL IJ SOAJ injection Inject 0.3 mg into the muscle as needed.     famotidine  (PEPCID ) 40 MG tablet Take  1 tablet    2 x / day (every 12 hours)  to Prevent Heart burn & Indigestion 180 tablet 3   glucose blood (ONE TOUCH ULTRA TEST) test strip Check Blood sugar Daily for diabetes 100 each 3   Lancets (ONETOUCH ULTRASOFT) lancets Check Blood sugar  daily 100 each 3   levothyroxine  (SYNTHROID ) 125 MCG tablet Take 1 tablet (125 mcg total) by mouth daily. 30 tablet 11   loperamide  (IMODIUM ) 2 MG capsule Take 1 capsule (2 mg total) by mouth as needed for diarrhea or loose stools. 30 capsule 0   losartan  (COZAAR ) 50 MG tablet Take 1 tablet (50 mg total) by mouth daily. 90 tablet 1   Magnesium 250 MG TABS Take 250 mg by mouth 2 (two) times daily.     metFORMIN  (GLUCOPHAGE -XR) 500 MG  24 hr tablet TAKE 2 TABLETS TWICE DAILY WITH MEALS FOR DIABETES 360 tablet 3   metoprolol  succinate (TOPROL -XL) 50 MG 24 hr tablet Take 1 tablet (50 mg total) by mouth daily. Take with or immediately following a meal. 90 tablet 1   montelukast  (SINGULAIR ) 10 MG tablet TAKE 1 TABLET BY MOUTH EVERYDAY AT BEDTIME 90 tablet 1   Multiple Vitamins-Minerals (MULTIVITAMIN PO) Take 1 tablet by mouth daily.      nortriptyline  (PAMELOR ) 10 MG capsule TAKE 1 CAPSULE BY MOUTH EVERYDAY AT BEDTIME 90 capsule 3   Omega-3 Fatty Acids (FISH OIL PO) Take 600 mg by mouth daily.     omeprazole  (PRILOSEC) 20 MG capsule Take 1 capsule daily to prevent Heartburn & Acid Reflux 90 capsule 1   PRESCRIPTION MEDICATION Inject as directed every 30 (thirty) days. Allergy  Shot     rosuvastatin  (CRESTOR ) 10 MG tablet TAKE 1 TABLET BY MOUTH EVERY DAY FOR CHOLESTEROL 90 tablet 1   tirzepatide  (MOUNJARO ) 15 MG/0.5ML Pen Inject 15 mg into the skin every 7 (seven) days for diabetes. 6 mL 1   Turmeric (QC TUMERIC COMPLEX PO) Take 1 tablet by mouth daily.     No current facility-administered medications on file prior to visit.    Review of Systems  Constitutional:  Negative for chills and fever.  Gastrointestinal:  Positive for abdominal pain (lower abd pain) and nausea.  Genitourinary:  Positive for dysuria, frequency, hematuria (this morning with pea sized clots) and urgency.       Bladder spasms  Neurological:  Negative for light-headedness and headaches.       Objective:   Vitals:   10/02/24 1032  BP: 108/68  Temp: 97.7 F (36.5 C)   BP Readings from Last 3 Encounters:  10/02/24 108/68  08/20/24 122/66  05/28/24 108/70   Wt Readings from Last 3 Encounters:  08/20/24 151 lb (68.5 kg)  05/28/24 152 lb (68.9 kg)  05/08/24 156 lb 3.2 oz (70.9 kg)   There is no height or weight on file to calculate BMI.    Physical Exam Constitutional:      General: She is not in acute distress.    Appearance: Normal appearance. She is not ill-appearing.  HENT:     Head: Normocephalic.  Eyes:     Conjunctiva/sclera: Conjunctivae normal.  Abdominal:     General: There is no distension.     Palpations: Abdomen is soft.     Tenderness: There is no abdominal tenderness. There is no right CVA tenderness, left CVA tenderness, guarding or rebound.  Skin:    General: Skin is warm and dry.  Neurological:     Mental Status: She is alert.            Assessment & Plan:      Assessment and Plan Assessment & Plan Gross hematuria, acute cystitis, suprapubic pain, left lower back pain.  Possible kidney stone, possible UTI Persistent hematuria with resolved UTI symptoms suggests possible passed urinary tract stone or urinary tract infection. Previous  imaging showed bladder thickening and normal left kidney cyst. No current pain or discomfort. Urinalysis shows probable infection. - Start Bactrim  DS twice daily x 5 days - Ordered  urine culture. - Ordered CT scan of kidneys and bladder. - Referred to urology for possible stone, gross hematuria. - Advised to report new symptoms such as pain or changes in urination.

## 2024-10-05 LAB — CULTURE, URINE COMPREHENSIVE

## 2024-10-08 ENCOUNTER — Ambulatory Visit: Admitting: Pulmonary Disease

## 2024-10-10 ENCOUNTER — Ambulatory Visit

## 2024-10-10 DIAGNOSIS — J302 Other seasonal allergic rhinitis: Secondary | ICD-10-CM | POA: Diagnosis not present

## 2024-10-10 DIAGNOSIS — J309 Allergic rhinitis, unspecified: Secondary | ICD-10-CM

## 2024-10-14 ENCOUNTER — Ambulatory Visit

## 2024-10-14 DIAGNOSIS — J309 Allergic rhinitis, unspecified: Secondary | ICD-10-CM

## 2024-10-14 DIAGNOSIS — J302 Other seasonal allergic rhinitis: Secondary | ICD-10-CM | POA: Diagnosis not present

## 2024-11-07 ENCOUNTER — Ambulatory Visit

## 2024-11-07 DIAGNOSIS — J302 Other seasonal allergic rhinitis: Secondary | ICD-10-CM | POA: Diagnosis not present

## 2024-11-11 ENCOUNTER — Other Ambulatory Visit: Payer: Self-pay | Admitting: Family Medicine

## 2024-11-11 DIAGNOSIS — E039 Hypothyroidism, unspecified: Secondary | ICD-10-CM

## 2024-11-15 ENCOUNTER — Other Ambulatory Visit: Payer: Self-pay | Admitting: Family Medicine

## 2024-11-15 DIAGNOSIS — E1122 Type 2 diabetes mellitus with diabetic chronic kidney disease: Secondary | ICD-10-CM

## 2024-11-17 ENCOUNTER — Encounter: Payer: Self-pay | Admitting: Family Medicine

## 2024-11-17 ENCOUNTER — Other Ambulatory Visit: Payer: Self-pay

## 2024-11-17 ENCOUNTER — Ambulatory Visit

## 2024-11-17 DIAGNOSIS — J302 Other seasonal allergic rhinitis: Secondary | ICD-10-CM | POA: Diagnosis not present

## 2024-11-17 DIAGNOSIS — K219 Gastro-esophageal reflux disease without esophagitis: Secondary | ICD-10-CM

## 2024-11-17 MED ORDER — FAMOTIDINE 40 MG PO TABS: 40.0000 mg | ORAL_TABLET | Freq: Every day | ORAL | 1 refills | Status: AC

## 2025-01-01 ENCOUNTER — Ambulatory Visit: Admitting: Neurology

## 2025-01-07 ENCOUNTER — Ambulatory Visit: Admitting: Pulmonary Disease

## 2025-02-18 ENCOUNTER — Ambulatory Visit: Admitting: Family Medicine

## 2025-03-30 ENCOUNTER — Ambulatory Visit: Admitting: Physician Assistant

## 2025-05-12 ENCOUNTER — Ambulatory Visit
